# Patient Record
Sex: Female | Born: 1940 | Race: White | Hispanic: No | Smoking: Former smoker
Health system: Southern US, Community
[De-identification: ages and names within clinical notes are randomized; demographics above are authoritative.]

## PROBLEM LIST (undated history)

## (undated) DIAGNOSIS — A419 Sepsis, unspecified organism: Secondary | ICD-10-CM

## (undated) DIAGNOSIS — J449 Chronic obstructive pulmonary disease, unspecified: Secondary | ICD-10-CM

## (undated) DIAGNOSIS — J439 Emphysema, unspecified: Secondary | ICD-10-CM

## (undated) DIAGNOSIS — R0600 Dyspnea, unspecified: Secondary | ICD-10-CM

## (undated) DIAGNOSIS — K635 Polyp of colon: Secondary | ICD-10-CM

## (undated) DIAGNOSIS — F419 Anxiety disorder, unspecified: Secondary | ICD-10-CM

## (undated) DIAGNOSIS — N6459 Other signs and symptoms in breast: Secondary | ICD-10-CM

## (undated) DIAGNOSIS — I252 Old myocardial infarction: Secondary | ICD-10-CM

## (undated) DIAGNOSIS — J189 Pneumonia, unspecified organism: Secondary | ICD-10-CM

## (undated) DIAGNOSIS — I10 Essential (primary) hypertension: Secondary | ICD-10-CM

## (undated) DIAGNOSIS — K219 Gastro-esophageal reflux disease without esophagitis: Secondary | ICD-10-CM

## (undated) DIAGNOSIS — M810 Age-related osteoporosis without current pathological fracture: Secondary | ICD-10-CM

## (undated) DIAGNOSIS — Z923 Personal history of irradiation: Secondary | ICD-10-CM

## (undated) DIAGNOSIS — T7840XA Allergy, unspecified, initial encounter: Secondary | ICD-10-CM

## (undated) DIAGNOSIS — E785 Hyperlipidemia, unspecified: Secondary | ICD-10-CM

## (undated) DIAGNOSIS — M1990 Unspecified osteoarthritis, unspecified site: Secondary | ICD-10-CM

## (undated) DIAGNOSIS — I2510 Atherosclerotic heart disease of native coronary artery without angina pectoris: Secondary | ICD-10-CM

## (undated) DIAGNOSIS — R609 Edema, unspecified: Secondary | ICD-10-CM

## (undated) DIAGNOSIS — I639 Cerebral infarction, unspecified: Secondary | ICD-10-CM

## (undated) DIAGNOSIS — C801 Malignant (primary) neoplasm, unspecified: Secondary | ICD-10-CM

## (undated) DIAGNOSIS — Z8719 Personal history of other diseases of the digestive system: Secondary | ICD-10-CM

## (undated) DIAGNOSIS — D649 Anemia, unspecified: Secondary | ICD-10-CM

## (undated) HISTORY — DX: Essential (primary) hypertension: I10

## (undated) HISTORY — PX: HERNIA REPAIR: SHX51

## (undated) HISTORY — DX: Emphysema, unspecified: J43.9

## (undated) HISTORY — DX: Polyp of colon: K63.5

## (undated) HISTORY — DX: Cerebral infarction, unspecified: I63.9

## (undated) HISTORY — DX: Age-related osteoporosis without current pathological fracture: M81.0

## (undated) HISTORY — DX: Personal history of other diseases of the digestive system: Z87.19

## (undated) HISTORY — DX: Old myocardial infarction: I25.2

## (undated) HISTORY — DX: Chronic obstructive pulmonary disease, unspecified: J44.9

## (undated) HISTORY — DX: Allergy, unspecified, initial encounter: T78.40XA

## (undated) HISTORY — DX: Hyperlipidemia, unspecified: E78.5

## (undated) HISTORY — DX: Anxiety disorder, unspecified: F41.9

## (undated) HISTORY — DX: Gastro-esophageal reflux disease without esophagitis: K21.9

---

## 1981-07-28 DIAGNOSIS — C439 Malignant melanoma of skin, unspecified: Secondary | ICD-10-CM

## 1981-07-28 HISTORY — DX: Malignant melanoma of skin, unspecified: C43.9

## 1996-07-28 HISTORY — PX: ABDOMINAL HYSTERECTOMY: SHX81

## 2006-07-28 DIAGNOSIS — I252 Old myocardial infarction: Secondary | ICD-10-CM

## 2006-07-28 HISTORY — DX: Old myocardial infarction: I25.2

## 2006-07-28 HISTORY — PX: INSERTION OF ILIAC STENT: SHX6256

## 2013-07-28 HISTORY — PX: CHOLECYSTECTOMY: SHX55

## 2016-07-29 ENCOUNTER — Encounter: Payer: Self-pay | Admitting: Nurse Practitioner

## 2016-07-29 ENCOUNTER — Other Ambulatory Visit (INDEPENDENT_AMBULATORY_CARE_PROVIDER_SITE_OTHER): Payer: Medicare Other

## 2016-07-29 ENCOUNTER — Ambulatory Visit (INDEPENDENT_AMBULATORY_CARE_PROVIDER_SITE_OTHER)
Admission: RE | Admit: 2016-07-29 | Discharge: 2016-07-29 | Disposition: A | Discharge status: Home / Self Care | Payer: Medicare Other | Source: Ambulatory Visit | Attending: Nurse Practitioner | Admitting: Nurse Practitioner

## 2016-07-29 ENCOUNTER — Ambulatory Visit (INDEPENDENT_AMBULATORY_CARE_PROVIDER_SITE_OTHER): Payer: Medicare Other | Admitting: Nurse Practitioner

## 2016-07-29 VITALS — BP 170/84 | HR 66 | Temp 97.7°F | Ht 61.0 in | Wt 174.0 lb

## 2016-07-29 DIAGNOSIS — K529 Noninfective gastroenteritis and colitis, unspecified: Secondary | ICD-10-CM | POA: Insufficient documentation

## 2016-07-29 DIAGNOSIS — J439 Emphysema, unspecified: Secondary | ICD-10-CM

## 2016-07-29 DIAGNOSIS — F4323 Adjustment disorder with mixed anxiety and depressed mood: Secondary | ICD-10-CM | POA: Insufficient documentation

## 2016-07-29 DIAGNOSIS — E782 Mixed hyperlipidemia: Secondary | ICD-10-CM | POA: Diagnosis not present

## 2016-07-29 DIAGNOSIS — J441 Chronic obstructive pulmonary disease with (acute) exacerbation: Secondary | ICD-10-CM

## 2016-07-29 DIAGNOSIS — I10 Essential (primary) hypertension: Secondary | ICD-10-CM | POA: Diagnosis not present

## 2016-07-29 DIAGNOSIS — E785 Hyperlipidemia, unspecified: Secondary | ICD-10-CM | POA: Insufficient documentation

## 2016-07-29 DIAGNOSIS — E876 Hypokalemia: Secondary | ICD-10-CM

## 2016-07-29 DIAGNOSIS — I2510 Atherosclerotic heart disease of native coronary artery without angina pectoris: Secondary | ICD-10-CM | POA: Insufficient documentation

## 2016-07-29 DIAGNOSIS — H9190 Unspecified hearing loss, unspecified ear: Secondary | ICD-10-CM | POA: Insufficient documentation

## 2016-07-29 DIAGNOSIS — F4320 Adjustment disorder, unspecified: Secondary | ICD-10-CM

## 2016-07-29 DIAGNOSIS — M810 Age-related osteoporosis without current pathological fracture: Secondary | ICD-10-CM | POA: Insufficient documentation

## 2016-07-29 DIAGNOSIS — B0229 Other postherpetic nervous system involvement: Secondary | ICD-10-CM | POA: Insufficient documentation

## 2016-07-29 DIAGNOSIS — K219 Gastro-esophageal reflux disease without esophagitis: Secondary | ICD-10-CM | POA: Insufficient documentation

## 2016-07-29 DIAGNOSIS — Z780 Asymptomatic menopausal state: Secondary | ICD-10-CM

## 2016-07-29 DIAGNOSIS — J449 Chronic obstructive pulmonary disease, unspecified: Secondary | ICD-10-CM | POA: Diagnosis not present

## 2016-07-29 LAB — CBC WITH DIFFERENTIAL/PLATELET
Basophils Absolute: 0 10*3/uL (ref 0.0–0.1)
Basophils Relative: 0.3 % (ref 0.0–3.0)
EOS ABS: 0.4 10*3/uL (ref 0.0–0.7)
Eosinophils Relative: 4.4 % (ref 0.0–5.0)
HCT: 49 % — ABNORMAL HIGH (ref 36.0–46.0)
Hemoglobin: 16.5 g/dL — ABNORMAL HIGH (ref 12.0–15.0)
LYMPHS ABS: 2 10*3/uL (ref 0.7–4.0)
Lymphocytes Relative: 21 % (ref 12.0–46.0)
MCHC: 33.6 g/dL (ref 30.0–36.0)
MCV: 88.7 fl (ref 78.0–100.0)
Monocytes Absolute: 1.1 10*3/uL — ABNORMAL HIGH (ref 0.1–1.0)
Monocytes Relative: 11.4 % (ref 3.0–12.0)
NEUTROS ABS: 5.9 10*3/uL (ref 1.4–7.7)
NEUTROS PCT: 62.9 % (ref 43.0–77.0)
PLATELETS: 321 10*3/uL (ref 150.0–400.0)
RBC: 5.53 Mil/uL — AB (ref 3.87–5.11)
RDW: 14.2 % (ref 11.5–15.5)
WBC: 9.4 10*3/uL (ref 4.0–10.5)

## 2016-07-29 LAB — COMPREHENSIVE METABOLIC PANEL
ALT: 39 U/L — AB (ref 0–35)
AST: 36 U/L (ref 0–37)
Albumin: 4.3 g/dL (ref 3.5–5.2)
Alkaline Phosphatase: 48 U/L (ref 39–117)
BILIRUBIN TOTAL: 0.4 mg/dL (ref 0.2–1.2)
BUN: 16 mg/dL (ref 6–23)
CO2: 34 meq/L — AB (ref 19–32)
CREATININE: 0.75 mg/dL (ref 0.40–1.20)
Calcium: 9.5 mg/dL (ref 8.4–10.5)
Chloride: 98 mEq/L (ref 96–112)
GFR: 80.08 mL/min (ref >60.00)
GLUCOSE: 103 mg/dL — AB (ref 70–99)
Potassium: 4.2 mEq/L (ref 3.5–5.1)
SODIUM: 138 meq/L (ref 135–145)
Total Protein: 7.4 g/dL (ref 6.0–8.3)

## 2016-07-29 LAB — LIPID PANEL
CHOL/HDL RATIO: 4
Cholesterol: 198 mg/dL (ref 0–200)
HDL: 56 mg/dL (ref >39.00)
LDL CALC: 104 mg/dL — AB (ref 0–99)
NONHDL: 141.57
Triglycerides: 186 mg/dL — ABNORMAL HIGH (ref 0.0–149.0)
VLDL: 37.2 mg/dL (ref 0.0–40.0)

## 2016-07-29 LAB — MAGNESIUM: MAGNESIUM: 1.8 mg/dL (ref 1.5–2.5)

## 2016-07-29 MED ORDER — METOPROLOL SUCCINATE ER 50 MG PO TB24: 50.0000 mg | ORAL_TABLET | Freq: Two times a day (BID) | ORAL | 2 refills | Status: DC

## 2016-07-29 MED ORDER — WELCHOL 625 MG PO TABS: 625.0000 mg | ORAL_TABLET | Freq: Three times a day (TID) | ORAL | 2 refills | Status: DC

## 2016-07-29 MED ORDER — RALOXIFENE HCL 60 MG PO TABS: 60.0000 mg | ORAL_TABLET | Freq: Every day | ORAL | 2 refills | Status: DC

## 2016-07-29 MED ORDER — SYMBICORT 160-4.5 MCG/ACT IN AERO: 2.0000 | INHALATION_SPRAY | Freq: Two times a day (BID) | RESPIRATORY_TRACT | 5 refills | Status: DC

## 2016-07-29 MED ORDER — GABAPENTIN 100 MG PO CAPS: 100.0000 mg | ORAL_CAPSULE | Freq: Three times a day (TID) | ORAL | 2 refills | Status: DC

## 2016-07-29 MED ORDER — LOVASTATIN 40 MG PO TABS
40.0000 mg | ORAL_TABLET | Freq: Every day | ORAL | 2 refills | Status: DC
Start: 2016-07-29 — End: 2016-10-28

## 2016-07-29 MED ORDER — DM-GUAIFENESIN ER 30-600 MG PO TB12: 1.0000 | ORAL_TABLET | Freq: Two times a day (BID) | ORAL | 0 refills | Status: DC | PRN

## 2016-07-29 MED ORDER — AMLODIPINE BESYLATE 5 MG PO TABS: 5.0000 mg | ORAL_TABLET | Freq: Every day | ORAL | 2 refills | Status: DC

## 2016-07-29 MED ORDER — OMEPRAZOLE 20 MG PO CPDR: 20.0000 mg | DELAYED_RELEASE_CAPSULE | Freq: Every day | ORAL | 2 refills | Status: DC

## 2016-07-29 MED ORDER — ALPRAZOLAM 0.25 MG PO TABS: 0.2500 mg | ORAL_TABLET | Freq: Every evening | ORAL | 0 refills | Status: DC | PRN

## 2016-07-29 NOTE — Progress Notes (Signed)
Subjective:    Patient ID: Courtney Brown, female    DOB: [birth year], 76 y.o.   MRN: [id number]  Patient presents today for establish care (new patient) and medication refill.  Moved to [place] from [place] 51month ago.  Cough  This is a recurrent problem. The current episode started 1 to 4 weeks ago. The problem has been waxing and waning. The cough is productive of sputum. Associated symptoms include nasal congestion, postnasal drip, rhinorrhea, shortness of breath and wheezing. Pertinent negatives include no chest pain, chills, ear congestion, ear pain, fever, headaches, heartburn, hemoptysis, myalgias, rash, sore throat, sweats or weight loss. The symptoms are aggravated by lying down and cold air. She has tried a beta-agonist inhaler, steroid inhaler and OTC cough suppressant for the symptoms. The treatment provided mild relief. Her past medical history is significant for COPD.   Anxiety:  Related to move from and [place] and husband's death. xanax used prn x 4years. No SI or Hi. Use of ETOH once a week or every other week.  HTN: Elevated BP today. She thinks it might be due to prolonged use of mucinex D over the last 1week. only use of metoprolol. Used lisnopril in past but not sure why medication was discontinued.  COPD:  uses albuterol daily Unsure of last PFT. Uses symbicort BID as prescribed  Chronic Diarrhea: Stable with use of welchol daily and lomotil prn. Onset after cholescystectomy. Evaluated by Gi, no infectious cause found.  Vascular disease: occassional edema with prolonged standing. No tingling or numbness. No pain with ambulation or with elevation of LE.  Immunizations: (TDAP, Hep C screen, Pneumovax, Influenza, zoster)  Health Maintenance  Topic Date Due  . Tetanus Vaccine  08/10/1960  . Mammogram  08/11/1991  . Colon Cancer Screening  08/11/1991  . Shingles Vaccine  08/10/2001  . DEXA scan (bone density measurement)  08/10/2006  . Pneumonia vaccines (1 of 2  - PCV13) 08/10/2006  . Flu Shot  02/26/2016   Weight:  Wt Readings from Last 3 Encounters:  07/29/16 174 lb (78.9 kg)   Exercise:none Fall Risk: Fall Risk  07/29/2016  Falls in the past year? No   Home Safety: home with dauther Depression/Suicide: [facility] 07/29/2016  Decreased Interest 0  Down, Depressed, Hopeless 0  PHQ - 2 Score 0   No flowsheet data found. Colonoscopy (every 5-17yr >50-7567yr last done 19yr28yro, polyps removed (beign per patient) Dexa (every 2-39yrs69yr39yrs73yrded Pap Smear (every 67yrs f48yr21-29 without HPV, every 39yrs fo13yr0-639yrs wi10yrV): not needed Mammogram (yearly, >439yrs): l55yrone 39yrs ago V41yrn:last done 12/2015,no glaucoma, no catarracts Advanced Directive: does not have one, declined information No flowsheet data found. Sexual History (birth control, marital status, STD): widowed, s/p total hysterectomy.  Medications and allergies reviewed with patient and updated if appropriate.  Patient Active Problem List   Diagnosis Date Noted  . COPD exacerbation (HCC) 01/02/[place]8  . HTN (hypertension), benign 07/29/2016  . Hyperlipidemia 07/29/2016  . CAD (coronary artery disease) 07/29/2016  . Chronic diarrhea 07/29/2016  . Post herpetic neuralgia 07/29/2016  . Adjustment disorder 07/29/2016  . Osteoporosis 07/29/2016  . Post-menopausal 07/29/2016  . Gastroesophageal reflux disease without esophagitis 07/29/2016  . Hearing loss 07/29/2016    No current outpatient prescriptions on file prior to visit.   No current facility-administered medications on file prior to visit.     Past Medical History:  Diagnosis Date  . Allergy   . Anxiety   . Colon polyps   .  COPD (chronic obstructive pulmonary disease) ([place])   . Emphysema of lung ([place])   . GERD (gastroesophageal reflux disease)   . H/O hiatal hernia   . Hyperlipidemia   . Hypertension   . Myocardial infarct, old 2008   no stent placed  . Osteoporosis   . Stroke  [facility])    TIA    Past Surgical History:  Procedure Laterality Date  . ABDOMINAL HYSTERECTOMY  1998  . 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 HERNIA REPAIR     umbilical  . INSERTION OF ILIAC STENT  2008    Social History   Social History  . Marital status: Widowed    Spouse name: N/A  . Number of children: N/A  . Years of education: N/A   Social History Main Topics  . Smoking status: Former Smoker    Packs/day: 1.50    Years: 15.00  . Smokeless tobacco: Never Used     Comment: stop 04/05/2006  . Alcohol use 1.2 oz/week    2 Shots of liquor per week     Comment: social, weekly  . Drug use: No  . Sexual activity: Not Currently   Other Topics Concern  . None   Social History Narrative  . None    Family History  Problem Relation Age of Onset  . Hearing loss Father   . Hearing loss Maternal Aunt   . Hearing loss Maternal Grandmother   . Cancer Maternal Grandmother     colon  . Arthritis Mother   . Cancer Paternal Grandmother     colon        Review of Systems  Constitutional: Negative for chills, fever, malaise/fatigue and weight loss.  HENT: Positive for hearing loss, postnasal drip and rhinorrhea. Negative for congestion, ear pain, sinus pain and sore throat.   Eyes:       Negative for visual changes  Respiratory: Positive for cough, shortness of breath and wheezing. Negative for hemoptysis.   Cardiovascular: Negative for chest pain, palpitations and leg swelling.  Gastrointestinal: Negative for blood in stool, constipation, diarrhea and heartburn.  Genitourinary: Negative for dysuria, frequency and urgency.  Musculoskeletal: Negative for falls, joint pain and myalgias.  Skin: Negative for rash.  Neurological: Negative for dizziness, sensory change and headaches.  Endo/Heme/Allergies: Does not bruise/bleed easily.  Psychiatric/Behavioral: Negative for depression, memory loss, substance abuse and suicidal ideas. The patient is nervous/anxious.  The patient does not have insomnia.     Objective:   Vitals:   07/29/16 1439  BP: (!) 170/84  Pulse: 66  Temp: 97.7 F (36.5 C)    Body mass index is 32.88 kg/m.   Physical Examination:  Physical Exam  Constitutional: She is oriented to person, place, and time. No distress.  HENT:  Nose: Mucosal edema and rhinorrhea present. Right sinus exhibits no maxillary sinus tenderness and no frontal sinus tenderness. Left sinus exhibits no maxillary sinus tenderness and no frontal sinus tenderness.  Mouth/Throat: Uvula is midline. Posterior oropharyngeal erythema present. No oropharyngeal exudate.  Eyes: Conjunctivae and EOM are normal. Pupils are equal, round, and reactive to light.  Neck: Normal range of motion. Neck supple.  Cardiovascular: Normal rate, regular rhythm and normal heart sounds.   Pulmonary/Chest: Effort normal. She has wheezes. She has rales.  Musculoskeletal: She exhibits no edema.  Neurological: She is alert and oriented to person, place, and time.  Skin: Skin is warm and dry.  Vitals reviewed.   ASSESSMENT and PLAN:  Tais  was seen today for establish care.  Diagnoses and all orders for this visit:  HTN (hypertension), benign -     metoprolol succinate (TOPROL-XL) 50 MG 24 hr tablet; Take 1 tablet (50 mg total) by mouth 2 (two) times daily. -     Comp Met (CMET); Future -     amLODipine (NORVASC) 5 MG tablet; Take 1 tablet (5 mg total) by mouth daily.  Pulmonary emphysema, unspecified emphysema type ([place]) -     SYMBICORT 160-4.5 MCG/ACT inhaler; Inhale 2 puffs into the lungs 2 (two) times daily. Inhale 1-2 puffs twice a day -     DG Chest 2 View; Future -     dextromethorphan-guaiFENesin (MUCINEX DM) 30-600 MG 12hr tablet; Take 1 tablet by mouth 2 (two) times daily as needed for cough. -     CBC w/Diff; Future -     Pulmonary function test; Future  Mixed hyperlipidemia -     lovastatin (MEVACOR) 40 MG tablet; Take 1 tablet (40 mg total) by mouth at  bedtime. -     WELCHOL 625 MG tablet; Take 1 tablet (625 mg total) by mouth 3 (three) times daily. -     Comp Met (CMET); Future -     Lipid panel; Future -     CK total and CKMB (cardiac)not at [facility]; Future  Coronary artery disease involving native heart without angina pectoris, unspecified vessel or lesion type  Chronic diarrhea -     WELCHOL 625 MG tablet; Take 1 tablet (625 mg total) by mouth 3 (three) times daily. -     Comp Met (CMET); Future -     Magnesium; Future  Post herpetic neuralgia -     gabapentin (NEURONTIN) 100 MG capsule; Take 1 capsule (100 mg total) by mouth 3 (three) times daily. For shingle  Adjustment disorder, unspecified type -     ALPRAZolam (XANAX) 0.25 MG tablet; Take 1 tablet (0.25 mg total) by mouth at bedtime as needed for anxiety.  Hypokalemia -     Comp Met (CMET); Future  Hypomagnesemia -     Comp Met (CMET); Future -     Magnesium; Future  Age related osteoporosis, unspecified pathological fracture presence -     raloxifene (EVISTA) 60 MG tablet; Take 1 tablet (60 mg total) by mouth daily. -     DG Bone Density; Future  Post-menopausal -     raloxifene (EVISTA) 60 MG tablet; Take 1 tablet (60 mg total) by mouth daily. -     DG Bone Density; Future -     MM Digital Screening; Future  Gastroesophageal reflux disease without esophagitis -     omeprazole (PRILOSEC) 20 MG capsule; Take 1 capsule (20 mg total) by mouth daily.    No problem-specific Assessment & Plan notes found for this encounter.   Recent Results (from the past 2160 hour(s))  CBC w/Diff     Status: Abnormal   Collection Time: 07/29/16  4:14 PM  Result Value Ref Range   WBC 9.4 4.0 - 10.5 K/uL   RBC 5.53 (H) 3.87 - 5.11 Mil/uL   Hemoglobin 16.5 (H) 12.0 - 15.0 g/dL   HCT 49.0 (H) 36.0 - 46.0 %   MCV 88.7 78.0 - 100.0 fl   MCHC 33.6 30.0 - 36.0 g/dL   RDW 14.2 11.5 - 15.5 %   Platelets 321.0 150.0 - 400.0 K/uL   Neutrophils Relative % 62.9 43.0 - 77.0 %   Lymphocytes  Relative 21.0 12.0 -  46.0 %   Monocytes Relative 11.4 3.0 - 12.0 %   Eosinophils Relative 4.4 0.0 - 5.0 %   Basophils Relative 0.3 0.0 - 3.0 %   Neutro Abs 5.9 1.4 - 7.7 K/uL   Lymphs Abs 2.0 0.7 - 4.0 K/uL   Monocytes Absolute 1.1 (H) 0.1 - 1.0 K/uL   Eosinophils Absolute 0.4 0.0 - 0.7 K/uL   Basophils Absolute 0.0 0.0 - 0.1 K/uL  Comp Met (CMET)     Status: Abnormal   Collection Time: 07/29/16  4:14 PM  Result Value Ref Range   Sodium 138 135 - 145 mEq/L   Potassium 4.2 3.5 - 5.1 mEq/L   Chloride 98 96 - 112 mEq/L   CO2 34 (H) 19 - 32 mEq/L   Glucose, Bld 103 (H) 70 - 99 mg/dL   BUN 16 6 - 23 mg/dL   Creatinine, Ser 0.75 0.40 - 1.20 mg/dL   Total Bilirubin 0.4 0.2 - 1.2 mg/dL   Alkaline Phosphatase 48 39 - 117 U/L   AST 36 0 - 37 U/L   ALT 39 (H) 0 - 35 U/L   Total Protein 7.4 6.0 - 8.3 g/dL   Albumin 4.3 3.5 - 5.2 g/dL   Calcium 9.5 8.4 - 10.5 mg/dL   GFR 80.08 >60.00 mL/min  Lipid panel     Status: Abnormal   Collection Time: 07/29/16  4:14 PM  Result Value Ref Range   Cholesterol 198 0 - 200 mg/dL    Comment: ATP III Classification       Desirable:  < 200 mg/dL               Borderline High:  200 - 239 mg/dL          High:  > = 240 mg/dL   Triglycerides 186.0 (H) 0.0 - 149.0 mg/dL    Comment: Normal:  <150 mg/dLBorderline High:  150 - 199 mg/dL   HDL 56.00 >39.00 mg/dL   VLDL 37.2 0.0 - 40.0 mg/dL   LDL Cholesterol 104 (H) 0 - 99 mg/dL   Total CHOL/HDL Ratio 4     Comment:                Men          Women1/2 Average Risk     3.4          3.3Average Risk          5.0          4.42X Average Risk          9.6          7.13X Average Risk          15.0          11.0                       NonHDL 141.57     Comment: NOTE:  Non-HDL goal should be 30 mg/dL higher than patient's LDL goal (i.e. LDL goal of < 70 mg/dL, would have non-HDL goal of < 100 mg/dL)  Magnesium     Status: None   Collection Time: 07/29/16  4:14 PM  Result Value Ref Range   Magnesium 1.8 1.5 - 2.5 mg/dL         Follow up: Return in about 3 months (around 10/27/2016) for HTN and, hyperlipidemia.  Wilfred Lacy, NP

## 2016-07-29 NOTE — Progress Notes (Signed)
Pre visit review using our clinic review tool, if applicable. No additional management support is needed unless otherwise documented below in the visit note. 

## 2016-07-29 NOTE — Patient Instructions (Addendum)
Normal CXR. Treat nasal and chest congestion with flonase, benzonatate and mucinex DM.  Sign medical release for records from previous providers.  Start new BP medication today  URI Instructions: Flonase and Afrin use: apply 1spray of afrin in each nare, wait 35mins, then apply 2sprays of flonase in each nare. Use both nasal spray consecutively x 3days, then flonase only for at least 14days.  Encourage adequate oral hydration.  Use over-the-counter  "cold" medicines  such as "Tylenol cold" , "Advil cold",  "Mucinex" or" Mucinex D"  for cough and congestion.  Avoid decongestants if you have high blood pressure. Use" Delsym" or" Robitussin" cough syrup varietis for cough.  You can use plain "Tylenol" or "Advi"l for fever, chills and achyness.

## 2016-07-30 LAB — CK TOTAL AND CKMB (NOT AT ARMC)
CK TOTAL: 44 U/L (ref 7–177)
CK, MB: 1.9 ng/mL (ref 0.0–5.0)
Relative Index: 4.3 — ABNORMAL HIGH (ref 0.0–4.0)

## 2016-07-31 NOTE — Progress Notes (Signed)
Normal results

## 2016-08-08 ENCOUNTER — Encounter: Payer: Self-pay | Admitting: Nurse Practitioner

## 2016-08-08 DIAGNOSIS — I739 Peripheral vascular disease, unspecified: Secondary | ICD-10-CM | POA: Insufficient documentation

## 2016-08-08 DIAGNOSIS — K635 Polyp of colon: Secondary | ICD-10-CM | POA: Insufficient documentation

## 2016-09-24 ENCOUNTER — Other Ambulatory Visit: Payer: Self-pay | Admitting: Internal Medicine

## 2016-09-24 DIAGNOSIS — F4320 Adjustment disorder, unspecified: Secondary | ICD-10-CM

## 2016-09-25 NOTE — Telephone Encounter (Signed)
Called pt to inform her what charolotte states concerning refill on her alprazolam. No answer LMOM w/charlotte response. Faxed [facility] to [facility].../lmb

## 2016-09-26 ENCOUNTER — Other Ambulatory Visit: Payer: Self-pay | Admitting: Internal Medicine

## 2016-09-26 DIAGNOSIS — F4320 Adjustment disorder, unspecified: Secondary | ICD-10-CM

## 2016-10-27 ENCOUNTER — Ambulatory Visit: Payer: Medicare Other | Admitting: Nurse Practitioner

## 2016-10-28 ENCOUNTER — Telehealth: Payer: Self-pay | Admitting: *Deleted

## 2016-10-28 DIAGNOSIS — I10 Essential (primary) hypertension: Secondary | ICD-10-CM

## 2016-10-28 DIAGNOSIS — E782 Mixed hyperlipidemia: Secondary | ICD-10-CM

## 2016-10-28 MED ORDER — LOVASTATIN 40 MG PO TABS: 40.0000 mg | ORAL_TABLET | Freq: Every day | ORAL | 0 refills | Status: DC

## 2016-10-28 MED ORDER — AMLODIPINE BESYLATE 5 MG PO TABS: 5.0000 mg | ORAL_TABLET | Freq: Every day | ORAL | 0 refills | Status: DC

## 2016-10-28 NOTE — Telephone Encounter (Signed)
Rec'd call pt states she have appt set for 4/9, but she is completely out of her amlodipine & lovastatin. Wanting to get a refill on med until appt. Inform pt will send a week supply to [facility] until appt...Johny Chess

## 2016-10-29 ENCOUNTER — Ambulatory Visit (INDEPENDENT_AMBULATORY_CARE_PROVIDER_SITE_OTHER): Payer: Medicare Other | Admitting: Internal Medicine

## 2016-10-29 DIAGNOSIS — J439 Emphysema, unspecified: Secondary | ICD-10-CM

## 2016-10-29 DIAGNOSIS — J441 Chronic obstructive pulmonary disease with (acute) exacerbation: Secondary | ICD-10-CM

## 2016-10-29 LAB — PULMONARY FUNCTION TEST
DL/VA % PRED: 90 %
DL/VA: 4 ml/min/mmHg/L
DLCO COR: 12.31 ml/min/mmHg
DLCO UNC % PRED: 63 %
DLCO UNC: 12.74 ml/min/mmHg
DLCO cor % pred: 60 %
FEF 25-75 PRE: 0.34 L/s
FEF 25-75 Post: 0.83 L/sec
FEF2575-%CHANGE-POST: 144 %
FEF2575-%PRED-POST: 56 %
FEF2575-%PRED-PRE: 22 %
FEV1-%Change-Post: 33 %
FEV1-%PRED-POST: 54 %
FEV1-%PRED-PRE: 40 %
FEV1-POST: 0.99 L
FEV1-Pre: 0.74 L
FEV1FVC-%Change-Post: 6 %
FEV1FVC-%Pred-Pre: 78 %
FEV6-%CHANGE-POST: 26 %
FEV6-%Pred-Post: 67 %
FEV6-%Pred-Pre: 53 %
FEV6-Post: 1.57 L
FEV6-Pre: 1.24 L
FEV6FVC-%Change-Post: 0 %
FEV6FVC-%Pred-Post: 104 %
FEV6FVC-%Pred-Pre: 104 %
FVC-%Change-Post: 25 %
FVC-%Pred-Post: 64 %
FVC-%Pred-Pre: 51 %
FVC-Post: 1.58 L
FVC-Pre: 1.26 L
POST FEV1/FVC RATIO: 63 %
POST FEV6/FVC RATIO: 99 %
PRE FEV1/FVC RATIO: 59 %
Pre FEV6/FVC Ratio: 99 %
RV % pred: 124 %
RV: 2.65 L
TLC % pred: 91 %
TLC: 4.2 L

## 2016-10-29 NOTE — Progress Notes (Signed)
PFT done today. 

## 2016-10-31 MED ORDER — TIOTROPIUM BROMIDE MONOHYDRATE 18 MCG IN CAPS: 18.0000 ug | ORAL_CAPSULE | Freq: Every day | RESPIRATORY_TRACT | 12 refills | Status: DC

## 2016-10-31 NOTE — Addendum Note (Signed)
Addended by: Wilfred Lacy L on: 10/31/2016 01:35 PM   Modules accepted: Orders

## 2016-11-03 ENCOUNTER — Ambulatory Visit (INDEPENDENT_AMBULATORY_CARE_PROVIDER_SITE_OTHER): Payer: Medicare Other | Admitting: Nurse Practitioner

## 2016-11-03 ENCOUNTER — Encounter: Payer: Self-pay | Admitting: Nurse Practitioner

## 2016-11-03 ENCOUNTER — Telehealth: Payer: Self-pay | Admitting: Nurse Practitioner

## 2016-11-03 ENCOUNTER — Other Ambulatory Visit: Payer: Self-pay | Admitting: Nurse Practitioner

## 2016-11-03 VITALS — BP 146/70 | HR 64 | Temp 97.5°F | Ht 61.0 in | Wt 174.0 lb

## 2016-11-03 DIAGNOSIS — M810 Age-related osteoporosis without current pathological fracture: Secondary | ICD-10-CM | POA: Diagnosis not present

## 2016-11-03 DIAGNOSIS — Z1231 Encounter for screening mammogram for malignant neoplasm of breast: Secondary | ICD-10-CM

## 2016-11-03 DIAGNOSIS — Z23 Encounter for immunization: Secondary | ICD-10-CM | POA: Diagnosis not present

## 2016-11-03 DIAGNOSIS — F4320 Adjustment disorder, unspecified: Secondary | ICD-10-CM

## 2016-11-03 DIAGNOSIS — E782 Mixed hyperlipidemia: Secondary | ICD-10-CM

## 2016-11-03 DIAGNOSIS — R600 Localized edema: Secondary | ICD-10-CM

## 2016-11-03 DIAGNOSIS — B0229 Other postherpetic nervous system involvement: Secondary | ICD-10-CM | POA: Diagnosis not present

## 2016-11-03 DIAGNOSIS — K529 Noninfective gastroenteritis and colitis, unspecified: Secondary | ICD-10-CM | POA: Diagnosis not present

## 2016-11-03 DIAGNOSIS — I10 Essential (primary) hypertension: Secondary | ICD-10-CM | POA: Diagnosis not present

## 2016-11-03 DIAGNOSIS — Z780 Asymptomatic menopausal state: Secondary | ICD-10-CM

## 2016-11-03 DIAGNOSIS — K219 Gastro-esophageal reflux disease without esophagitis: Secondary | ICD-10-CM

## 2016-11-03 MED ORDER — METOPROLOL SUCCINATE ER 50 MG PO TB24: 50.0000 mg | ORAL_TABLET | Freq: Two times a day (BID) | ORAL | 1 refills | Status: DC

## 2016-11-03 MED ORDER — ALBUTEROL SULFATE HFA 108 (90 BASE) MCG/ACT IN AERS: 1.0000 | INHALATION_SPRAY | Freq: Four times a day (QID) | RESPIRATORY_TRACT | 3 refills | Status: DC | PRN

## 2016-11-03 MED ORDER — FENOFIBRATE 145 MG PO TABS: 145.0000 mg | ORAL_TABLET | Freq: Every day | ORAL | 1 refills | Status: DC

## 2016-11-03 MED ORDER — LOVASTATIN 40 MG PO TABS: 40.0000 mg | ORAL_TABLET | Freq: Every day | ORAL | 1 refills | Status: DC

## 2016-11-03 MED ORDER — OMEPRAZOLE 20 MG PO CPDR: 20.0000 mg | DELAYED_RELEASE_CAPSULE | Freq: Every day | ORAL | 1 refills | Status: DC

## 2016-11-03 MED ORDER — GABAPENTIN 100 MG PO CAPS: 100.0000 mg | ORAL_CAPSULE | Freq: Three times a day (TID) | ORAL | 1 refills | Status: DC

## 2016-11-03 MED ORDER — RALOXIFENE HCL 60 MG PO TABS: 60.0000 mg | ORAL_TABLET | Freq: Every day | ORAL | 1 refills | Status: DC

## 2016-11-03 MED ORDER — WELCHOL 625 MG PO TABS: 625.0000 mg | ORAL_TABLET | Freq: Two times a day (BID) | ORAL | 1 refills | Status: DC

## 2016-11-03 MED ORDER — AMLODIPINE BESYLATE 5 MG PO TABS: 5.0000 mg | ORAL_TABLET | Freq: Every day | ORAL | 1 refills | Status: DC

## 2016-11-03 MED ORDER — KNEE COMPRESSION SLEEVE/L/XL MISC: 1.0000 [IU] | Freq: Every day | 0 refills | Status: DC

## 2016-11-03 MED ORDER — ALPRAZOLAM 0.25 MG PO TABS: ORAL_TABLET | ORAL | 1 refills | Status: DC

## 2016-11-03 NOTE — Progress Notes (Signed)
Subjective:  Patient ID: Courtney Brown, female    DOB: 1940/08/26  Age: 76 y.o. MRN: [id number]  CC: Follow-up (3 mo fu BP/ got the list of BP/req meds refill/ tdap?)   HPI HTN follow up:  Home BP readings: 130s/63 to 140s/70s.  No palpitation, no HA, no dizziness, Edema worse during the day and improves with leg elevation. No pain with ambulation.  Also needs medications refilled.  Anxiety: Stable. Use of xanax once a day at bedtime.  Hyperlipidemia: No myalgia or arthralgia  Chronic diarrhea: stable with use of welchol.  COPD: Current tobacco use. No interested in quitting at this time. SOB stable with use of albuterol, spiriva and symbicort.  GERD: stable with omeprazole.  Outpatient Medications Prior to Visit  Medication Sig Dispense Refill  . acetaminophen (TYLENOL) 500 MG tablet Take 500 mg by mouth every 6 (six) hours as needed.    Marland Kitchen albuterol (PROVENTIL HFA;VENTOLIN HFA) 108 (90 Base) MCG/ACT inhaler Inhale 1-2 puffs into the lungs every 6 (six) hours as needed for wheezing or shortness of breath. 2 puff by mouth every 4 hours PRN 1 Inhaler 3  . aspirin 81 MG tablet Take 81 mg by mouth daily.    Marland Kitchen dextromethorphan-guaiFENesin (MUCINEX DM) 30-600 MG 12hr tablet Take 1 tablet by mouth 2 (two) times daily as needed for cough. 14 tablet 0  . diphenoxylate-atropine (LOMOTIL) 2.5-0.025 MG tablet Take by mouth 4 (four) times daily as needed for diarrhea or loose stools.    . Ferrous Gluconate-C-Folic Acid (IRON-C PO) Take 65 mg by mouth 1 day or 1 dose.    . Garlic Oil 8588 MG CAPS Take 1,000 mg by mouth 1 day or 1 dose.    . Lactobacillus (PROBIOTIC ACIDOPHILUS) CAPS Take by mouth once.    . loratadine (CLARITIN) 10 MG tablet Take 10 mg by mouth daily.  3  . ondansetron (ZOFRAN) 4 MG tablet Take 4 mg by mouth every 8 (eight) hours as needed for nausea or vomiting.    . SYMBICORT 160-4.5 MCG/ACT inhaler Inhale 2 puffs into the lungs 2 (two) times daily. Inhale 1-2 puffs  twice a day 1 Inhaler 5  . tiotropium (SPIRIVA) 18 MCG inhalation capsule Place 1 capsule (18 mcg total) into inhaler and inhale daily. 30 capsule 12  . ALPRAZolam (XANAX) 0.25 MG tablet TAKE 1 TABLET AT BEDTIME AS NEEDED FOR ANXIETY 30 tablet 0  . amLODipine (NORVASC) 5 MG tablet Take 1 tablet (5 mg total) by mouth daily. Must keep 11/03/16 appt for future refills 7 tablet 0  . fenofibrate (TRICOR) 145 MG tablet Take 145 mg by mouth daily.  3  . gabapentin (NEURONTIN) 100 MG capsule Take 1 capsule (100 mg total) by mouth 3 (three) times daily. For shingle 90 capsule 2  . lovastatin (MEVACOR) 40 MG tablet Take 1 tablet (40 mg total) by mouth at bedtime. Must keep 11/03/16 appt for future refills 7 tablet 0  . metoprolol succinate (TOPROL-XL) 50 MG 24 hr tablet Take 1 tablet (50 mg total) by mouth 2 (two) times daily. 60 tablet 2  . omeprazole (PRILOSEC) 20 MG capsule Take 1 capsule (20 mg total) by mouth daily. 30 capsule 2  . raloxifene (EVISTA) 60 MG tablet Take 1 tablet (60 mg total) by mouth daily. 30 tablet 2  . WELCHOL 625 MG tablet Take 1 tablet (625 mg total) by mouth 3 (three) times daily. 90 tablet 2   No facility-administered medications prior to visit.  ROS See HPI  Objective:  BP (!) 146/70   Pulse 64   Temp 97.5 F (36.4 C)   Ht 5\' 1"  (1.549 m)   Wt 174 lb (78.9 kg)   SpO2 91%   BMI 32.88 kg/m   BP Readings from Last 3 Encounters:  11/03/16 (!) 146/70  07/29/16 (!) 170/84    Wt Readings from Last 3 Encounters:  11/03/16 174 lb (78.9 kg)  07/29/16 174 lb (78.9 kg)    Physical Exam  Constitutional: She is oriented to person, place, and time. No distress.  HENT:  Right Ear: External ear normal.  Left Ear: External ear normal.  Nose: Nose normal.  Mouth/Throat: No oropharyngeal exudate.  Eyes: No scleral icterus.  Neck: Normal range of motion. Neck supple. No thyromegaly present.  Cardiovascular: Normal rate, regular rhythm and normal heart sounds.     Pulmonary/Chest: Effort normal and breath sounds normal. No respiratory distress.  Abdominal: Soft. She exhibits no distension.  Musculoskeletal: Normal range of motion. She exhibits edema. She exhibits no tenderness.  Lymphadenopathy:    She has no cervical adenopathy.  Neurological: She is alert and oriented to person, place, and time.  Skin: Skin is warm and dry.  Psychiatric: She has a normal mood and affect. Her behavior is normal.  Vitals reviewed.   Lab Results  Component Value Date   WBC 9.4 07/29/2016   HGB 16.5 (H) 07/29/2016   HCT 49.0 (H) 07/29/2016   PLT 321.0 07/29/2016   GLUCOSE 103 (H) 07/29/2016   CHOL 198 07/29/2016   TRIG 186.0 (H) 07/29/2016   HDL 56.00 07/29/2016   LDLCALC 104 (H) 07/29/2016   ALT 39 (H) 07/29/2016   AST 36 07/29/2016   NA 138 07/29/2016   K 4.2 07/29/2016   CL 98 07/29/2016   CREATININE 0.75 07/29/2016   BUN 16 07/29/2016   CO2 34 (H) 07/29/2016    Dg Chest 2 View  Result Date: 07/29/2016 CLINICAL DATA:  COPD exacerbation EXAM: CHEST  2 VIEW COMPARISON:  None. FINDINGS: Pulmonary hyperinflation consistent with COPD. Apical scarring on the right. Negative for pneumonia. Negative for mass or adenopathy. Cardiac enlargement without heart failure. Atherosclerotic calcification in the aorta. IMPRESSION: COPD without acute cardiopulmonary abnormality. Electronically Signed   By: Franchot Gallo M.D.   On: 07/29/2016 16:15    Assessment & Plan:   Lasean was seen today for follow-up.  Diagnoses and all orders for this visit:  HTN (hypertension), benign -     amLODipine (NORVASC) 5 MG tablet; Take 1 tablet (5 mg total) by mouth at bedtime. Must keep 11/03/16 appt for future refills -     metoprolol succinate (TOPROL-XL) 50 MG 24 hr tablet; Take 1 tablet (50 mg total) by mouth 2 (two) times daily.  Mixed hyperlipidemia -     WELCHOL 625 MG tablet; Take 1 tablet (625 mg total) by mouth 2 (two) times daily with a meal. -     lovastatin  (MEVACOR) 40 MG tablet; Take 1 tablet (40 mg total) by mouth at bedtime. Must keep 11/03/16 appt for future refills -     fenofibrate (TRICOR) 145 MG tablet; Take 1 tablet (145 mg total) by mouth daily.  Adjustment disorder, unspecified type -     ALPRAZolam (XANAX) 0.25 MG tablet; TAKE 1 TABLET AT BEDTIME AS NEEDED FOR ANXIETY  Post herpetic neuralgia -     gabapentin (NEURONTIN) 100 MG capsule; Take 1 capsule (100 mg total) by mouth 3 (three) times daily.  For shingle  Chronic diarrhea -     WELCHOL 625 MG tablet; Take 1 tablet (625 mg total) by mouth 2 (two) times daily with a meal.  Gastroesophageal reflux disease without esophagitis -     omeprazole (PRILOSEC) 20 MG capsule; Take 1 capsule (20 mg total) by mouth daily.  Age related osteoporosis, unspecified pathological fracture presence -     raloxifene (EVISTA) 60 MG tablet; Take 1 tablet (60 mg total) by mouth daily.  Post-menopausal -     raloxifene (EVISTA) 60 MG tablet; Take 1 tablet (60 mg total) by mouth daily.  Need for diphtheria-tetanus-pertussis (Tdap) vaccine, adult/adolescent -     Tdap vaccine greater than or equal to 7yo IM  Bilateral leg edema -     Elastic Bandages & Supports (KNEE COMPRESSION SLEEVE/L/XL) MISC; 1 Units by Does not apply route daily.   I have changed Ms. Rauber's amLODipine, WELCHOL, and fenofibrate. I am also having her start on Knee Compression Sleeve/L/XL. Additionally, I am having her maintain her loratadine, diphenoxylate-atropine, ondansetron, aspirin, PROBIOTIC ACIDOPHILUS, acetaminophen, Garlic Oil, Ferrous Gluconate-C-Folic Acid (IRON-C PO), SYMBICORT, dextromethorphan-guaiFENesin, tiotropium, albuterol, gabapentin, lovastatin, metoprolol succinate, omeprazole, raloxifene, and ALPRAZolam.  Meds ordered this encounter  Medications  . gabapentin (NEURONTIN) 100 MG capsule    Sig: Take 1 capsule (100 mg total) by mouth 3 (three) times daily. For shingle    Dispense:  270 capsule    Refill:   1    Order Specific Question:   Supervising Provider    Answer:   Cassandria Anger [[id number]]  . amLODipine (NORVASC) 5 MG tablet    Sig: Take 1 tablet (5 mg total) by mouth at bedtime. Must keep 11/03/16 appt for future refills    Dispense:  90 tablet    Refill:  1    Order Specific Question:   Supervising Provider    Answer:   Cassandria Anger [[id number]]  . WELCHOL 625 MG tablet    Sig: Take 1 tablet (625 mg total) by mouth 2 (two) times daily with a meal.    Dispense:  180 tablet    Refill:  1    Order Specific Question:   Supervising Provider    Answer:   Cassandria Anger [[id number]]  . lovastatin (MEVACOR) 40 MG tablet    Sig: Take 1 tablet (40 mg total) by mouth at bedtime. Must keep 11/03/16 appt for future refills    Dispense:  90 tablet    Refill:  1    Order Specific Question:   Supervising Provider    Answer:   Cassandria Anger [[id number]]  . metoprolol succinate (TOPROL-XL) 50 MG 24 hr tablet    Sig: Take 1 tablet (50 mg total) by mouth 2 (two) times daily.    Dispense:  180 tablet    Refill:  1    Order Specific Question:   Supervising Provider    Answer:   Cassandria Anger [[id number]]  . omeprazole (PRILOSEC) 20 MG capsule    Sig: Take 1 capsule (20 mg total) by mouth daily.    Dispense:  90 capsule    Refill:  1    Order Specific Question:   Supervising Provider    Answer:   Cassandria Anger [[id number]]  . raloxifene (EVISTA) 60 MG tablet    Sig: Take 1 tablet (60 mg total) by mouth daily.    Dispense:  90 tablet    Refill:  1    Order Specific Question:  Supervising Provider    Answer:   Cassandria Anger [[id number]]  . fenofibrate (TRICOR) 145 MG tablet    Sig: Take 1 tablet (145 mg total) by mouth daily.    Dispense:  90 tablet    Refill:  1    Order Specific Question:   Supervising Provider    Answer:   Cassandria Anger [[id number]]  . ALPRAZolam (XANAX) 0.25 MG tablet    Sig: TAKE 1 TABLET AT BEDTIME AS NEEDED FOR ANXIETY    Dispense:  30 tablet    Refill:  1     Not to exceed 5 additional fills before 07/29/2018CUSTOMER NEEDS REFILLS PLEASE. THANK YOU.    Order Specific Question:   Supervising Provider    Answer:   Cassandria Anger [[id number]]  . Elastic Bandages & Supports (KNEE COMPRESSION SLEEVE/L/XL) MISC    Sig: 1 Units by Does not apply route daily.    Dispense:  1 each    Refill:  0    Order Specific Question:   Supervising Provider    Answer:   Cassandria Anger [[id number]]    Follow-up: Return in about 6 months (around 05/05/2017) for hyperlipidemia and anxiety (fasting, need repeat labs.Wilfred Lacy, NP

## 2016-11-03 NOTE — Progress Notes (Signed)
Pre visit review using our clinic review tool, if applicable. No additional management support is needed unless otherwise documented below in the visit note. 

## 2016-11-03 NOTE — Telephone Encounter (Signed)
rx sent as request

## 2016-11-03 NOTE — Patient Instructions (Addendum)
Need to schedule mammogram and dexa scan.  Use compression stocking during the day and off at night.   DASH Eating Plan DASH stands for "Dietary Approaches to Stop Hypertension." The DASH eating plan is a healthy eating plan that has been shown to reduce high blood pressure (hypertension). It may also reduce your risk for type 2 diabetes, heart disease, and stroke. The DASH eating plan may also help with weight loss. What are tips for following this plan? General guidelines   Avoid eating more than 2,300 mg (milligrams) of salt (sodium) a day. If you have hypertension, you may need to reduce your sodium intake to 1,500 mg a day.  Limit alcohol intake to no more than 1 drink a day for nonpregnant women and 2 drinks a day for men. One drink equals 12 oz of beer, 5 oz of wine, or 1 oz of hard liquor.  Work with your health care provider to maintain a healthy body weight or to lose weight. Ask what an ideal weight is for you.  Get at least 30 minutes of exercise that causes your heart to beat faster (aerobic exercise) most days of the week. Activities may include walking, swimming, or biking.  Work with your health care provider or diet and nutrition specialist (dietitian) to adjust your eating plan to your individual calorie needs. Reading food labels   Check food labels for the amount of sodium per serving. Choose foods with less than 5 percent of the Daily Value of sodium. Generally, foods with less than 300 mg of sodium per serving fit into this eating plan.  To find whole grains, look for the word "whole" as the first word in the ingredient list. Shopping   Buy products labeled as "low-sodium" or "no salt added."  Buy fresh foods. Avoid canned foods and premade or frozen meals. Cooking   Avoid adding salt when cooking. Use salt-free seasonings or herbs instead of table salt or sea salt. Check with your health care provider or pharmacist before using salt substitutes.  Do not fry  foods. Cook foods using healthy methods such as baking, boiling, grilling, and broiling instead.  Cook with heart-healthy oils, such as olive, canola, soybean, or sunflower oil. Meal planning    Eat a balanced diet that includes:  5 or more servings of fruits and vegetables each day. At each meal, try to fill half of your plate with fruits and vegetables.  Up to 6-8 servings of whole grains each day.  Less than 6 oz of lean meat, poultry, or fish each day. A 3-oz serving of meat is about the same size as a deck of cards. One egg equals 1 oz.  2 servings of low-fat dairy each day.  A serving of nuts, seeds, or beans 5 times each week.  Heart-healthy fats. Healthy fats called Omega-3 fatty acids are found in foods such as flaxseeds and coldwater fish, like sardines, salmon, and mackerel.  Limit how much you eat of the following:  Canned or prepackaged foods.  Food that is high in trans fat, such as fried foods.  Food that is high in saturated fat, such as fatty meat.  Sweets, desserts, sugary drinks, and other foods with added sugar.  Full-fat dairy products.  Do not salt foods before eating.  Try to eat at least 2 vegetarian meals each week.  Eat more home-cooked food and less restaurant, buffet, and fast food.  When eating at a restaurant, ask that your food be prepared with less  salt or no salt, if possible. What foods are recommended? The items listed may not be a complete list. Talk with your dietitian about what dietary choices are best for you. Grains  Whole-grain or whole-wheat bread. Whole-grain or whole-wheat pasta. Brown rice. [place]. Bulgur. Whole-grain and low-sodium cereals. Pita bread. Low-fat, low-sodium crackers. Whole-wheat flour tortillas. Vegetables  Fresh or frozen vegetables (raw, steamed, roasted, or grilled). Low-sodium or reduced-sodium tomato and vegetable juice. Low-sodium or reduced-sodium tomato sauce and tomato paste. Low-sodium or  reduced-sodium canned vegetables. Fruits  All fresh, dried, or frozen fruit. Canned fruit in natural juice (without added sugar). Meat and other protein foods  Skinless chicken or [place]. Ground chicken or [place]. Pork with fat trimmed off. Fish and seafood. Egg whites. Dried beans, peas, or lentils. Unsalted nuts, nut butters, and seeds. Unsalted canned beans. Lean cuts of beef with fat trimmed off. Low-sodium, lean deli meat. Dairy  Low-fat (1%) or fat-free (skim) milk. Fat-free, low-fat, or reduced-fat cheeses. Nonfat, low-sodium ricotta or cottage cheese. Low-fat or nonfat yogurt. Low-fat, low-sodium cheese. Fats and oils  Soft margarine without trans fats. Vegetable oil. Low-fat, reduced-fat, or light mayonnaise and salad dressings (reduced-sodium). Canola, safflower, olive, soybean, and sunflower oils. Avocado. Seasoning and other foods  Herbs. Spices. Seasoning mixes without salt. Unsalted popcorn and pretzels. Fat-free sweets. What foods are not recommended? The items listed may not be a complete list. Talk with your dietitian about what dietary choices are best for you. Grains  Baked goods made with fat, such as croissants, muffins, or some breads. Dry pasta or rice meal packs. Vegetables  Creamed or fried vegetables. Vegetables in a cheese sauce. Regular canned vegetables (not low-sodium or reduced-sodium). Regular canned tomato sauce and paste (not low-sodium or reduced-sodium). Regular tomato and vegetable juice (not low-sodium or reduced-sodium). Angie Fava. Olives. Fruits  Canned fruit in a light or heavy syrup. Fried fruit. Fruit in cream or butter sauce. Meat and other protein foods  Fatty cuts of meat. Ribs. Fried meat. Berniece Salines. Sausage. Bologna and other processed lunch meats. Salami. Fatback. Hotdogs. Bratwurst. Salted nuts and seeds. Canned beans with added salt. Canned or smoked fish. Whole eggs or egg yolks. Chicken or [place] with skin. Dairy  Whole or 2% milk, cream, and  half-and-half. Whole or full-fat cream cheese. Whole-fat or sweetened yogurt. Full-fat cheese. Nondairy creamers. Whipped toppings. Processed cheese and cheese spreads. Fats and oils  Butter. Stick margarine. Lard. Shortening. Ghee. Bacon fat. Tropical oils, such as coconut, palm kernel, or palm oil. Seasoning and other foods  Salted popcorn and pretzels. Onion salt, garlic salt, seasoned salt, table salt, and sea salt. Worcestershire sauce. Tartar sauce. Barbecue sauce. Teriyaki sauce. Soy sauce, including reduced-sodium. Steak sauce. Canned and packaged gravies. Fish sauce. Oyster sauce. Cocktail sauce. Horseradish that you find on the shelf. Ketchup. Mustard. Meat flavorings and tenderizers. Bouillon cubes. Hot sauce and Tabasco sauce. Premade or packaged marinades. Premade or packaged taco seasonings. Relishes. Regular salad dressings. Where to find more information:  National Heart, Lung, and [place]: https://wilson-eaton.com/  American Heart Association: www.heart.org Summary  The DASH eating plan is a healthy eating plan that has been shown to reduce high blood pressure (hypertension). It may also reduce your risk for type 2 diabetes, heart disease, and stroke.  With the DASH eating plan, you should limit salt (sodium) intake to 2,300 mg a day. If you have hypertension, you may need to reduce your sodium intake to 1,500 mg a day.  When on the DASH eating  plan, aim to eat more fresh fruits and vegetables, whole grains, lean proteins, low-fat dairy, and heart-healthy fats.  Work with your health care provider or diet and nutrition specialist (dietitian) to adjust your eating plan to your individual calorie needs. This information is not intended to replace advice given to you by your health care provider. Make sure you discuss any questions you have with your health care provider. Document Released: 07/03/2011 Document Revised: 07/07/2016 Document Reviewed: 07/07/2016 Elsevier Interactive  Patient Education  2017 [employer].

## 2016-11-17 ENCOUNTER — Ambulatory Visit (INDEPENDENT_AMBULATORY_CARE_PROVIDER_SITE_OTHER)
Admission: RE | Admit: 2016-11-17 | Discharge: 2016-11-17 | Disposition: A | Discharge status: Home / Self Care | Payer: Medicare Other | Source: Ambulatory Visit | Attending: Nurse Practitioner | Admitting: Nurse Practitioner

## 2016-11-17 DIAGNOSIS — M810 Age-related osteoporosis without current pathological fracture: Secondary | ICD-10-CM | POA: Diagnosis not present

## 2016-11-17 DIAGNOSIS — Z780 Asymptomatic menopausal state: Secondary | ICD-10-CM | POA: Diagnosis not present

## 2016-11-24 ENCOUNTER — Ambulatory Visit: Payer: Medicare Other

## 2016-11-25 ENCOUNTER — Encounter (HOSPITAL_COMMUNITY): Payer: Self-pay | Admitting: Obstetrics and Gynecology

## 2016-11-25 ENCOUNTER — Inpatient Hospital Stay (HOSPITAL_COMMUNITY)
Admission: EM | Admit: 2016-11-25 | Discharge: 2016-11-29 | DRG: 871 | Disposition: A | Discharge status: Home Health | Payer: Medicare Other | Attending: Family Medicine | Admitting: Family Medicine

## 2016-11-25 ENCOUNTER — Emergency Department (HOSPITAL_COMMUNITY): Payer: Medicare Other

## 2016-11-25 DIAGNOSIS — M810 Age-related osteoporosis without current pathological fracture: Secondary | ICD-10-CM | POA: Diagnosis present

## 2016-11-25 DIAGNOSIS — J440 Chronic obstructive pulmonary disease with acute lower respiratory infection: Secondary | ICD-10-CM | POA: Diagnosis not present

## 2016-11-25 DIAGNOSIS — A419 Sepsis, unspecified organism: Secondary | ICD-10-CM | POA: Diagnosis not present

## 2016-11-25 DIAGNOSIS — F419 Anxiety disorder, unspecified: Secondary | ICD-10-CM | POA: Diagnosis present

## 2016-11-25 DIAGNOSIS — Z9049 Acquired absence of other specified parts of digestive tract: Secondary | ICD-10-CM

## 2016-11-25 DIAGNOSIS — N179 Acute kidney failure, unspecified: Secondary | ICD-10-CM | POA: Diagnosis not present

## 2016-11-25 DIAGNOSIS — I10 Essential (primary) hypertension: Secondary | ICD-10-CM

## 2016-11-25 DIAGNOSIS — E876 Hypokalemia: Secondary | ICD-10-CM | POA: Diagnosis present

## 2016-11-25 DIAGNOSIS — R8271 Bacteriuria: Secondary | ICD-10-CM | POA: Diagnosis not present

## 2016-11-25 DIAGNOSIS — E872 Acidosis: Secondary | ICD-10-CM | POA: Diagnosis present

## 2016-11-25 DIAGNOSIS — I739 Peripheral vascular disease, unspecified: Secondary | ICD-10-CM | POA: Diagnosis present

## 2016-11-25 DIAGNOSIS — A411 Sepsis due to other specified staphylococcus: Principal | ICD-10-CM | POA: Diagnosis present

## 2016-11-25 DIAGNOSIS — A403 Sepsis due to Streptococcus pneumoniae: Secondary | ICD-10-CM | POA: Diagnosis not present

## 2016-11-25 DIAGNOSIS — H9190 Unspecified hearing loss, unspecified ear: Secondary | ICD-10-CM | POA: Diagnosis not present

## 2016-11-25 DIAGNOSIS — I252 Old myocardial infarction: Secondary | ICD-10-CM

## 2016-11-25 DIAGNOSIS — Z8673 Personal history of transient ischemic attack (TIA), and cerebral infarction without residual deficits: Secondary | ICD-10-CM

## 2016-11-25 DIAGNOSIS — J13 Pneumonia due to Streptococcus pneumoniae: Secondary | ICD-10-CM | POA: Diagnosis not present

## 2016-11-25 DIAGNOSIS — K219 Gastro-esophageal reflux disease without esophagitis: Secondary | ICD-10-CM | POA: Diagnosis not present

## 2016-11-25 DIAGNOSIS — Z90710 Acquired absence of both cervix and uterus: Secondary | ICD-10-CM | POA: Diagnosis not present

## 2016-11-25 DIAGNOSIS — Z888 Allergy status to other drugs, medicaments and biological substances status: Secondary | ICD-10-CM

## 2016-11-25 DIAGNOSIS — J9601 Acute respiratory failure with hypoxia: Secondary | ICD-10-CM

## 2016-11-25 DIAGNOSIS — Z822 Family history of deafness and hearing loss: Secondary | ICD-10-CM | POA: Diagnosis not present

## 2016-11-25 DIAGNOSIS — J1000 Influenza due to other identified influenza virus with unspecified type of pneumonia: Secondary | ICD-10-CM | POA: Diagnosis not present

## 2016-11-25 DIAGNOSIS — R05 Cough: Secondary | ICD-10-CM | POA: Diagnosis not present

## 2016-11-25 DIAGNOSIS — I2510 Atherosclerotic heart disease of native coronary artery without angina pectoris: Secondary | ICD-10-CM | POA: Diagnosis present

## 2016-11-25 DIAGNOSIS — Z7982 Long term (current) use of aspirin: Secondary | ICD-10-CM

## 2016-11-25 DIAGNOSIS — E785 Hyperlipidemia, unspecified: Secondary | ICD-10-CM | POA: Diagnosis not present

## 2016-11-25 DIAGNOSIS — R7881 Bacteremia: Secondary | ICD-10-CM | POA: Diagnosis not present

## 2016-11-25 DIAGNOSIS — Z95828 Presence of other vascular implants and grafts: Secondary | ICD-10-CM

## 2016-11-25 DIAGNOSIS — Z87891 Personal history of nicotine dependence: Secondary | ICD-10-CM | POA: Diagnosis not present

## 2016-11-25 DIAGNOSIS — J1008 Influenza due to other identified influenza virus with other specified pneumonia: Secondary | ICD-10-CM | POA: Diagnosis present

## 2016-11-25 DIAGNOSIS — J181 Lobar pneumonia, unspecified organism: Secondary | ICD-10-CM | POA: Diagnosis not present

## 2016-11-25 DIAGNOSIS — R0602 Shortness of breath: Secondary | ICD-10-CM | POA: Diagnosis not present

## 2016-11-25 DIAGNOSIS — Z8261 Family history of arthritis: Secondary | ICD-10-CM | POA: Diagnosis not present

## 2016-11-25 DIAGNOSIS — J449 Chronic obstructive pulmonary disease, unspecified: Secondary | ICD-10-CM | POA: Diagnosis not present

## 2016-11-25 DIAGNOSIS — J189 Pneumonia, unspecified organism: Secondary | ICD-10-CM | POA: Diagnosis not present

## 2016-11-25 DIAGNOSIS — K449 Diaphragmatic hernia without obstruction or gangrene: Secondary | ICD-10-CM | POA: Diagnosis present

## 2016-11-25 DIAGNOSIS — Z800 Family history of malignant neoplasm of digestive organs: Secondary | ICD-10-CM | POA: Diagnosis not present

## 2016-11-25 DIAGNOSIS — Z7952 Long term (current) use of systemic steroids: Secondary | ICD-10-CM

## 2016-11-25 DIAGNOSIS — Z79899 Other long term (current) drug therapy: Secondary | ICD-10-CM

## 2016-11-25 DIAGNOSIS — B957 Other staphylococcus as the cause of diseases classified elsewhere: Secondary | ICD-10-CM

## 2016-11-25 DIAGNOSIS — J101 Influenza due to other identified influenza virus with other respiratory manifestations: Secondary | ICD-10-CM | POA: Diagnosis not present

## 2016-11-25 LAB — URINALYSIS, ROUTINE W REFLEX MICROSCOPIC
BILIRUBIN URINE: NEGATIVE
Glucose, UA: NEGATIVE mg/dL
Hgb urine dipstick: NEGATIVE
KETONES UR: NEGATIVE mg/dL
NITRITE: NEGATIVE
Protein, ur: NEGATIVE mg/dL
Specific Gravity, Urine: 1.009 (ref 1.005–1.030)
pH: 6 (ref 5.0–8.0)

## 2016-11-25 LAB — EXPECTORATED SPUTUM ASSESSMENT W REFEX TO RESP CULTURE

## 2016-11-25 LAB — COMPREHENSIVE METABOLIC PANEL
ALBUMIN: 3.1 g/dL — AB (ref 3.5–5.0)
ALT: 19 U/L (ref 14–54)
ANION GAP: 14 (ref 5–15)
AST: 23 U/L (ref 15–41)
Alkaline Phosphatase: 33 U/L — ABNORMAL LOW (ref 38–126)
BILIRUBIN TOTAL: 0.8 mg/dL (ref 0.3–1.2)
BUN: 12 mg/dL (ref 6–20)
CHLORIDE: 98 mmol/L — AB (ref 101–111)
CO2: 25 mmol/L (ref 22–32)
Calcium: 8.3 mg/dL — ABNORMAL LOW (ref 8.9–10.3)
Creatinine, Ser: 1.01 mg/dL — ABNORMAL HIGH (ref 0.44–1.00)
GFR calc Af Amer: >60 mL/min (ref >60)
GFR calc non Af Amer: 53 mL/min — ABNORMAL LOW (ref >60)
GLUCOSE: 156 mg/dL — AB (ref 65–99)
POTASSIUM: 3 mmol/L — AB (ref 3.5–5.1)
SODIUM: 137 mmol/L (ref 135–145)
TOTAL PROTEIN: 7.2 g/dL (ref 6.5–8.1)

## 2016-11-25 LAB — CBC WITH DIFFERENTIAL/PLATELET
BASOS ABS: 0.1 10*3/uL (ref 0.0–0.1)
Basophils Relative: 1 %
EOS PCT: 0 %
Eosinophils Absolute: 0 10*3/uL (ref 0.0–0.7)
HEMATOCRIT: 48.2 % — AB (ref 36.0–46.0)
HEMOGLOBIN: 15.8 g/dL — AB (ref 12.0–15.0)
LYMPHS ABS: 1.1 10*3/uL (ref 0.7–4.0)
LYMPHS PCT: 8 %
MCH: 30.4 pg (ref 26.0–34.0)
MCHC: 32.8 g/dL (ref 30.0–36.0)
MCV: 92.9 fL (ref 78.0–100.0)
Monocytes Absolute: 1.1 10*3/uL — ABNORMAL HIGH (ref 0.1–1.0)
Monocytes Relative: 8 %
NEUTROS ABS: 11.2 10*3/uL — AB (ref 1.7–7.7)
Neutrophils Relative %: 83 %
Platelets: 204 10*3/uL (ref 150–400)
RBC: 5.19 MIL/uL — AB (ref 3.87–5.11)
RDW: 13.7 % (ref 11.5–15.5)
WBC: 13.5 10*3/uL — ABNORMAL HIGH (ref 4.0–10.5)

## 2016-11-25 LAB — MRSA PCR SCREENING: MRSA BY PCR: NEGATIVE

## 2016-11-25 LAB — CBC
HCT: 40.6 % (ref 36.0–46.0)
Hemoglobin: 13 g/dL (ref 12.0–15.0)
MCH: 29.5 pg (ref 26.0–34.0)
MCHC: 32 g/dL (ref 30.0–36.0)
MCV: 92.3 fL (ref 78.0–100.0)
PLATELETS: 185 10*3/uL (ref 150–400)
RBC: 4.4 MIL/uL (ref 3.87–5.11)
RDW: 13.9 % (ref 11.5–15.5)
WBC: 14.3 10*3/uL — AB (ref 4.0–10.5)

## 2016-11-25 LAB — LACTIC ACID, PLASMA
Lactic Acid, Venous: 2.6 mmol/L (ref 0.5–1.9)
Lactic Acid, Venous: 3.5 mmol/L (ref 0.5–1.9)
Lactic Acid, Venous: 1.8 mmol/L (ref 0.5–1.9)
Lactic Acid, Venous: 1.5 mmol/L (ref 0.5–1.9)

## 2016-11-25 LAB — INFLUENZA PANEL BY PCR (TYPE A & B)
INFLAPCR: NEGATIVE
INFLBPCR: POSITIVE — AB

## 2016-11-25 LAB — I-STAT CG4 LACTIC ACID, ED: LACTIC ACID, VENOUS: 2.29 mmol/L — AB (ref 0.5–1.9)

## 2016-11-25 LAB — CREATININE, SERUM
CREATININE: 0.98 mg/dL (ref 0.44–1.00)
GFR, EST NON AFRICAN AMERICAN: 55 mL/min — AB (ref >60)

## 2016-11-25 LAB — PROCALCITONIN: Procalcitonin: 1.11 ng/mL

## 2016-11-25 LAB — EXPECTORATED SPUTUM ASSESSMENT W GRAM STAIN, RFLX TO RESP C

## 2016-11-25 LAB — TSH: TSH: 0.604 u[IU]/mL (ref 0.350–4.500)

## 2016-11-25 MED ORDER — ONDANSETRON HCL 4 MG PO TABS: 4.0000 mg | ORAL_TABLET | Freq: Four times a day (QID) | ORAL | Status: DC | PRN

## 2016-11-25 MED ORDER — ALPRAZOLAM 0.5 MG PO TABS
0.5000 mg | ORAL_TABLET | Freq: Two times a day (BID) | ORAL | Status: DC | PRN
  Administered 2016-11-25 – 2016-11-28 (×3): 0.5 mg via ORAL
  Filled 2016-11-25 (×3): qty 1

## 2016-11-25 MED ORDER — GABAPENTIN 100 MG PO CAPS
100.0000 mg | ORAL_CAPSULE | Freq: Three times a day (TID) | ORAL | Status: DC
  Administered 2016-11-25 – 2016-11-29 (×13): 100 mg via ORAL
  Filled 2016-11-25 (×13): qty 1

## 2016-11-25 MED ORDER — DEXTROSE 5 % IV SOLN: 1.0000 g | Freq: Once | INTRAVENOUS | Status: DC

## 2016-11-25 MED ORDER — SODIUM CHLORIDE 0.9 % IV BOLUS (SEPSIS)
30.0000 mL/kg | Freq: Once | INTRAVENOUS | Status: AC
  Administered 2016-11-25: 1785 mL via INTRAVENOUS

## 2016-11-25 MED ORDER — DEXTROSE 5 % IV SOLN
1.0000 g | INTRAVENOUS | Status: DC
  Administered 2016-11-26: 1 g via INTRAVENOUS
  Filled 2016-11-25: qty 10

## 2016-11-25 MED ORDER — POTASSIUM CHLORIDE CRYS ER 20 MEQ PO TBCR
40.0000 meq | EXTENDED_RELEASE_TABLET | Freq: Once | ORAL | Status: AC
  Administered 2016-11-25: 40 meq via ORAL
  Filled 2016-11-25: qty 2

## 2016-11-25 MED ORDER — TIOTROPIUM BROMIDE MONOHYDRATE 18 MCG IN CAPS
18.0000 ug | ORAL_CAPSULE | Freq: Every day | RESPIRATORY_TRACT | Status: DC
  Administered 2016-11-25 – 2016-11-28 (×3): 18 ug via RESPIRATORY_TRACT
  Filled 2016-11-25: qty 5

## 2016-11-25 MED ORDER — FENOFIBRATE 160 MG PO TABS
160.0000 mg | ORAL_TABLET | Freq: Every day | ORAL | Status: DC
  Administered 2016-11-25 – 2016-11-29 (×5): 160 mg via ORAL
  Filled 2016-11-25 (×5): qty 1

## 2016-11-25 MED ORDER — CHLORHEXIDINE GLUCONATE 0.12 % MT SOLN
15.0000 mL | Freq: Two times a day (BID) | OROMUCOSAL | Status: DC
  Administered 2016-11-25 – 2016-11-29 (×8): 15 mL via OROMUCOSAL
  Filled 2016-11-25 (×8): qty 15

## 2016-11-25 MED ORDER — ASPIRIN 81 MG PO CHEW
81.0000 mg | CHEWABLE_TABLET | Freq: Every day | ORAL | Status: DC
  Administered 2016-11-25 – 2016-11-29 (×5): 81 mg via ORAL
  Filled 2016-11-25 (×5): qty 1

## 2016-11-25 MED ORDER — ORAL CARE MOUTH RINSE
15.0000 mL | Freq: Two times a day (BID) | OROMUCOSAL | Status: DC
  Administered 2016-11-25 – 2016-11-29 (×7): 15 mL via OROMUCOSAL

## 2016-11-25 MED ORDER — PRAVASTATIN SODIUM 40 MG PO TABS
40.0000 mg | ORAL_TABLET | Freq: Every day | ORAL | Status: DC
  Administered 2016-11-25 – 2016-11-28 (×4): 40 mg via ORAL
  Filled 2016-11-25 (×3): qty 1
  Filled 2016-11-25: qty 2

## 2016-11-25 MED ORDER — DEXTROSE 5 % IV SOLN: 500.0000 mg | Freq: Once | INTRAVENOUS | Status: DC

## 2016-11-25 MED ORDER — DEXTROSE 5 % IV SOLN
500.0000 mg | Freq: Once | INTRAVENOUS | Status: AC
  Administered 2016-11-25: 500 mg via INTRAVENOUS
  Filled 2016-11-25: qty 500

## 2016-11-25 MED ORDER — DEXTROSE 5 % IV SOLN
1.0000 g | Freq: Once | INTRAVENOUS | Status: AC
  Administered 2016-11-25: 1 g via INTRAVENOUS
  Filled 2016-11-25: qty 10

## 2016-11-25 MED ORDER — ACETAMINOPHEN 325 MG PO TABS
650.0000 mg | ORAL_TABLET | Freq: Once | ORAL | Status: AC
  Administered 2016-11-25: 650 mg via ORAL
  Filled 2016-11-25: qty 2

## 2016-11-25 MED ORDER — ALBUTEROL SULFATE (2.5 MG/3ML) 0.083% IN NEBU
2.5000 mg | INHALATION_SOLUTION | Freq: Four times a day (QID) | RESPIRATORY_TRACT | Status: DC | PRN
  Administered 2016-11-25: 2.5 mg via RESPIRATORY_TRACT
  Filled 2016-11-25: qty 3

## 2016-11-25 MED ORDER — ONDANSETRON HCL 4 MG/2ML IJ SOLN: 4.0000 mg | Freq: Four times a day (QID) | INTRAMUSCULAR | Status: DC | PRN

## 2016-11-25 MED ORDER — PANTOPRAZOLE SODIUM 40 MG PO TBEC
40.0000 mg | DELAYED_RELEASE_TABLET | Freq: Every day | ORAL | Status: DC
  Administered 2016-11-25 – 2016-11-29 (×5): 40 mg via ORAL
  Filled 2016-11-25 (×5): qty 1

## 2016-11-25 MED ORDER — LORATADINE 10 MG PO TABS
10.0000 mg | ORAL_TABLET | Freq: Every day | ORAL | Status: DC
  Administered 2016-11-25 – 2016-11-29 (×5): 10 mg via ORAL
  Filled 2016-11-25 (×5): qty 1

## 2016-11-25 MED ORDER — FENTANYL CITRATE (PF) 100 MCG/2ML IJ SOLN
50.0000 ug | Freq: Once | INTRAMUSCULAR | Status: AC
  Administered 2016-11-25: 50 ug via INTRAVENOUS
  Filled 2016-11-25: qty 2

## 2016-11-25 MED ORDER — ENOXAPARIN SODIUM 30 MG/0.3ML ~~LOC~~ SOLN: 30.0000 mg | SUBCUTANEOUS | Status: DC

## 2016-11-25 MED ORDER — RALOXIFENE HCL 60 MG PO TABS
60.0000 mg | ORAL_TABLET | Freq: Every day | ORAL | Status: DC
  Administered 2016-11-25 – 2016-11-29 (×5): 60 mg via ORAL
  Filled 2016-11-25 (×5): qty 1

## 2016-11-25 MED ORDER — COLESEVELAM HCL 625 MG PO TABS
625.0000 mg | ORAL_TABLET | Freq: Two times a day (BID) | ORAL | Status: DC
  Administered 2016-11-25 – 2016-11-29 (×9): 625 mg via ORAL
  Filled 2016-11-25 (×10): qty 1

## 2016-11-25 MED ORDER — ACETAMINOPHEN 500 MG PO TABS: 500.0000 mg | ORAL_TABLET | Freq: Four times a day (QID) | ORAL | Status: DC | PRN

## 2016-11-25 MED ORDER — DEXTROSE 5 % IV SOLN
500.0000 mg | INTRAVENOUS | Status: DC
  Administered 2016-11-26: 500 mg via INTRAVENOUS
  Filled 2016-11-25 (×2): qty 500

## 2016-11-25 MED ORDER — ENOXAPARIN SODIUM 40 MG/0.4ML ~~LOC~~ SOLN
40.0000 mg | SUBCUTANEOUS | Status: DC
  Administered 2016-11-25 – 2016-11-28 (×4): 40 mg via SUBCUTANEOUS
  Filled 2016-11-25 (×4): qty 0.4

## 2016-11-25 MED ORDER — METHYLPREDNISOLONE SODIUM SUCC 125 MG IJ SOLR
125.0000 mg | Freq: Once | INTRAMUSCULAR | Status: DC
  Filled 2016-11-25: qty 2

## 2016-11-25 MED ORDER — SODIUM CHLORIDE 0.9% FLUSH
3.0000 mL | Freq: Two times a day (BID) | INTRAVENOUS | Status: DC
  Administered 2016-11-25 – 2016-11-29 (×8): 3 mL via INTRAVENOUS

## 2016-11-25 MED ORDER — SODIUM CHLORIDE 0.9 % IV SOLN
INTRAVENOUS | Status: DC
  Administered 2016-11-25 (×2): via INTRAVENOUS

## 2016-11-25 MED ORDER — OSELTAMIVIR PHOSPHATE 30 MG PO CAPS
30.0000 mg | ORAL_CAPSULE | Freq: Two times a day (BID) | ORAL | Status: DC
  Administered 2016-11-25 – 2016-11-29 (×9): 30 mg via ORAL
  Filled 2016-11-25 (×10): qty 1

## 2016-11-25 NOTE — ED Notes (Signed)
ED Provider at bedside. 

## 2016-11-25 NOTE — ED Notes (Signed)
MD and RN made aware of elevated lactic 

## 2016-11-25 NOTE — ED Triage Notes (Signed)
Per EMS:   Patient coming from home. Started feeling sick about 2 weeks ago and started staying with daughter. Pt had difficulty breathing and productive cough with white sputum. Pt sounds junky throughout all lung fields.  85% on RA Pt given 10 of albuterol and 0.5 of atrovent and that brought her up to 93%. Pt placed on non-rebreather and sats up to 96%.  Prednisone allergy  137/59 HR 130  20 RAC

## 2016-11-25 NOTE — ED Notes (Signed)
Hospitalist at bedside 

## 2016-11-25 NOTE — H&P (Signed)
History and Physical    Courtney Brown:[id number] DOB: [birth year] DOA: 11/25/2016  Referring MD/NP/PA: Dr. Venora Maples  PCP: Wilfred Lacy, NP    Patient coming from: home   Chief Complaint: shortness of breath  HPI: Courtney Brown is a 76 y.o. female with medical history significant for dyslipidemia who presented to [facility] ED with reports of shortness of breath , feeling weak and tired for past 2 weeks and getting worse over past day or so. Pt reported cough productive of white sputum, fevers and chills. No chest pain, palpitations. No abdominal pain, nausea or vomiting. No falls, no loss of consciousness. No urinary complaints.    ED Course: On admission, T max was 103.2 F, HR 30, RR 132, oxygen saturation 89% on room air which has improved to 95% with [place] oxygen support. CXR showed pneumonia in right and possibly left lung. Blood work showed WBC count 13.5, potassium 3.0 (supplemented), lactic acid 2.29. She was started on empiric abx (rocephin and zosyn). Her flu test for positive for influenza and she was started on tamiflu.  Review of Systems:  Constitutional: Negative for fever, chills, diaphoresis, activity change, appetite change and fatigue.  HENT: Negative for ear pain, nosebleeds, congestion, facial swelling, rhinorrhea, neck pain, neck stiffness and ear discharge.   Eyes: Negative for pain, discharge, redness, itching and visual disturbance.  Respiratory: per HPI  Cardiovascular: Negative for chest pain, palpitations and leg swelling.  Gastrointestinal: Negative for abdominal distention.  Genitourinary: Negative for dysuria, urgency, frequency, hematuria, flank pain, decreased urine volume, difficulty urinating and dyspareunia.  Musculoskeletal: Negative for back pain, joint swelling, arthralgias and gait problem.  Neurological: Negative for dizziness, tremors, seizures, syncope, facial asymmetry, speech difficulty, weakness, light-headedness, numbness and headaches.  Hematological:  Negative for adenopathy. Does not bruise/bleed easily.  Psychiatric/Behavioral: Negative for hallucinations, behavioral problems, confusion, dysphoric mood, decreased concentration and agitation.   Past Medical History:  Diagnosis Date  . Allergy   . Anxiety   . Colon polyps   . COPD (chronic obstructive pulmonary disease) ([place])   . Emphysema of lung ([place])   . GERD (gastroesophageal reflux disease)   . H/O hiatal hernia   . Hyperlipidemia   . Hypertension   . Myocardial infarct, old 2008   no stent placed  . Osteoporosis   . Stroke [facility])    TIA    Past Surgical History:  Procedure Laterality Date  . ABDOMINAL HYSTERECTOMY  1998  . 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 HERNIA REPAIR     umbilical, ventral  . INSERTION OF ILIAC STENT  2008    Social history:  reports that she has quit smoking. She has a 22.50 pack-year smoking history. She has never used smokeless tobacco. She reports that she drinks about 1.2 oz of alcohol per week . She reports that she does not use drugs.  Ambulation: ambulates without assistance at baseline   Allergies  Allergen Reactions  . Prednisone Other (See Comments)    Leg pain,light headed    Family History  Problem Relation Age of Onset  . Hearing loss Father   . Hearing loss Maternal Aunt   . Hearing loss Maternal Grandmother   . Cancer Maternal Grandmother     colon  . Arthritis Mother   . Cancer Paternal Grandmother     colon    Prior to Admission medications   Medication Sig Start Date End Date Taking? Authorizing Provider  acetaminophen (TYLENOL) 500 MG tablet Take 500 mg  by mouth every 6 (six) hours as needed.   Yes Historical Provider, MD  albuterol (PROVENTIL HFA;VENTOLIN HFA) 108 (90 Base) MCG/ACT inhaler Inhale 1-2 puffs into the lungs every 6 (six) hours as needed for wheezing or shortness of breath. 2 puff by mouth every 4 hours PRN 11/03/16  Yes Charlene Brooke Nche, NP  ALPRAZolam (XANAX) 0.25 MG tablet TAKE  1 TABLET AT BEDTIME AS NEEDED FOR ANXIETY 11/03/16  Yes Charlene Brooke Nche, NP  amLODipine (NORVASC) 5 MG tablet Take 1 tablet (5 mg total) by mouth at bedtime. Must keep 11/03/16 appt for future refills 11/03/16  Yes Flossie Buffy, NP  aspirin 81 MG tablet Take 81 mg by mouth daily.   Yes Historical Provider, MD  dextromethorphan-guaiFENesin (MUCINEX DM) 30-600 MG 12hr tablet Take 1 tablet by mouth 2 (two) times daily as needed for cough. 07/29/16  Yes Charlene Brooke Nche, NP  diphenoxylate-atropine (LOMOTIL) 2.5-0.025 MG tablet Take by mouth 4 (four) times daily as needed for diarrhea or loose stools.   Yes Historical Provider, MD  fenofibrate (TRICOR) 145 MG tablet Take 1 tablet (145 mg total) by mouth daily. 11/03/16  Yes Flossie Buffy, NP  Ferrous Gluconate-C-Folic Acid (IRON-C PO) Take 65 mg by mouth 1 day or 1 dose.   Yes Historical Provider, MD  gabapentin (NEURONTIN) 100 MG capsule Take 1 capsule (100 mg total) by mouth 3 (three) times daily. For shingle 11/03/16  Yes Charlene Brooke Nche, NP  Garlic Oil 8588 MG CAPS Take 1,000 mg by mouth 1 day or 1 dose.   Yes Historical Provider, MD  Lactobacillus (PROBIOTIC ACIDOPHILUS) CAPS Take by mouth once.   Yes Historical Provider, MD  loratadine (CLARITIN) 10 MG tablet Take 10 mg by mouth daily. 06/02/16  Yes Historical Provider, MD  lovastatin (MEVACOR) 40 MG tablet Take 1 tablet (40 mg total) by mouth at bedtime. Must keep 11/03/16 appt for future refills 11/03/16  Yes Flossie Buffy, NP  metoprolol succinate (TOPROL-XL) 50 MG 24 hr tablet Take 1 tablet (50 mg total) by mouth 2 (two) times daily. 11/03/16  Yes Charlene Brooke Nche, NP  omeprazole (PRILOSEC) 20 MG capsule Take 1 capsule (20 mg total) by mouth daily. 11/03/16  Yes Charlene Brooke Nche, NP  ondansetron (ZOFRAN) 4 MG tablet Take 4 mg by mouth every 8 (eight) hours as needed for nausea or vomiting.   Yes Historical Provider, MD  raloxifene (EVISTA) 60 MG tablet Take 1 tablet (60 mg total) by mouth  daily. 11/03/16  Yes Flossie Buffy, NP  SYMBICORT 160-4.5 MCG/ACT inhaler Inhale 2 puffs into the lungs 2 (two) times daily. Inhale 1-2 puffs twice a day 07/29/16  Yes Charlene Brooke Nche, NP  tiotropium (SPIRIVA) 18 MCG inhalation capsule Place 1 capsule (18 mcg total) into inhaler and inhale daily. 10/31/16  Yes Flossie Buffy, NP  WELCHOL 625 MG tablet Take 1 tablet (625 mg total) by mouth 2 (two) times daily with a meal. 11/03/16  Yes Flossie Buffy, NP  Elastic Bandages & Supports (KNEE COMPRESSION SLEEVE/L/XL) MISC 1 Units by Does not apply route daily. 11/03/16   Flossie Buffy, NP    Physical Exam: Vitals:   11/25/16 1300 11/25/16 1330 11/25/16 1400 11/25/16 1500  BP: 121/63 (!) 106/50 (!) 106/50   Pulse: (!) 113 (!) 107 (!) 101 94  Resp: 19 19 16 18   Temp:    99.1 F (37.3 C)  TempSrc:    Oral  SpO2: (!) 89% 92%  92% 95%  Weight:    78.2 kg (172 lb 6.4 oz)  Height:    5' 1"  (1.549 m)    Constitutional: NAD, calm, comfortable Vitals:   11/25/16 1300 11/25/16 1330 11/25/16 1400 11/25/16 1500  BP: 121/63 (!) 106/50 (!) 106/50   Pulse: (!) 113 (!) 107 (!) 101 94  Resp: 19 19 16 18   Temp:    99.1 F (37.3 C)  TempSrc:    Oral  SpO2: (!) 89% 92% 92% 95%  Weight:    78.2 kg (172 lb 6.4 oz)  Height:    5' 1"  (1.549 m)   Eyes: PERRL, lids and conjunctivae normal ENMT: Mucous membranes are moist. Posterior pharynx clear of any exudate or lesions.Normal dentition.  Neck: normal, supple, no masses, no thyromegaly Respiratory: diminished breath sounds, rhonchi in right lung Cardiovascular: Rate controlled, appreciate S1-S1   Abdomen: no tenderness, no masses palpated. No hepatosplenomegaly. Bowel sounds positive.  Musculoskeletal: no clubbing / cyanosis. No joint deformity upper and lower extremities. Good ROM, no contractures. Normal muscle tone.  Skin: no rashes, lesions, ulcers. No induration Neurologic: CN 2-12 grossly intact. Sensation intact, DTR normal. Strength 5/5  in all 4.  Psychiatric: Normal judgment and insight. Alert and oriented x 3. Normal mood.   Labs on Admission: I have personally reviewed following labs and imaging studies  CBC:  Recent Labs Lab 11/25/16 1225 11/25/16 1456  WBC 13.5* 14.3*  NEUTROABS 11.2*  --   HGB 15.8* 13.0  HCT 48.2* 40.6  MCV 92.9 92.3  PLT 204 098   Basic Metabolic Panel:  Recent Labs Lab 11/25/16 1225 11/25/16 1456  NA 137  --   K 3.0*  --   CL 98*  --   CO2 25  --   GLUCOSE 156*  --   BUN 12  --   CREATININE 1.01* 0.98  CALCIUM 8.3*  --    GFR: Estimated Creatinine Clearance: 47 mL/min (by C-G formula based on SCr of 0.98 mg/dL). Liver Function Tests:  Recent Labs Lab 11/25/16 1225  AST 23  ALT 19  ALKPHOS 33*  BILITOT 0.8  PROT 7.2  ALBUMIN 3.1*   No results for input(s): LIPASE, AMYLASE in the last 168 hours. No results for input(s): AMMONIA in the last 168 hours. Coagulation Profile: No results for input(s): INR, PROTIME in the last 168 hours. Cardiac Enzymes: No results for input(s): CKTOTAL, CKMB, CKMBINDEX, TROPONINI in the last 168 hours. BNP (last 3 results) No results for input(s): PROBNP in the last 8760 hours. HbA1C: No results for input(s): HGBA1C in the last 72 hours. CBG: No results for input(s): GLUCAP in the last 168 hours. Lipid Profile: No results for input(s): CHOL, HDL, LDLCALC, TRIG, CHOLHDL, LDLDIRECT in the last 72 hours. Thyroid Function Tests: No results for input(s): TSH, T4TOTAL, FREET4, T3FREE, THYROIDAB in the last 72 hours. Anemia Panel: No results for input(s): VITAMINB12, FOLATE, FERRITIN, TIBC, IRON, RETICCTPCT in the last 72 hours. Urine analysis: No results found for: COLORURINE, APPEARANCEUR, LABSPEC, PHURINE, GLUCOSEU, HGBUR, BILIRUBINUR, KETONESUR, PROTEINUR, UROBILINOGEN, NITRITE, LEUKOCYTESUR Sepsis Labs: @LABRCNTIP (procalcitonin:4,lacticidven:4) )No results found for this or any previous visit (from the past 240 hour(s)).    Radiological Exams on Admission: Dg Chest Port 1 View Result Date: 11/25/2016 Waynetta Sandy consolidation in the right mid lung and to a lesser extent in the left base consistent with pneumonia. Lungs elsewhere clear. Stable cardiac prominence. There is aortic atherosclerosis. Followup PA and lateral chest radiographs recommended in 3-4 weeks following trial of  antibiotic therapy to ensure resolution and exclude underlying malignancy.   EKG: pending   Assessment/Plan  Active Problems: Sepsis due to pneumonia / Influenza pneumonia / leukocytosis and lactic acidosis / Acute respiratory failure with hypoxia - Sepsis criteria met on admission with fever of 103.59F, tachycardia, tachypnea, hypoxia, leukocytosis and lactic acidosis - Sepsis and pneumonia order set utilized - CXR on admission showed right mid lung and left base consistent with pneumonia; follow up CXR needed to exclude malignancy  - Influenza PCR positive for Influenza B; started tamiflu - On empiric rocephin and azithromycin while awaiting blood cx results - Strep pneumonia is pending - Resp cx is pending - Monitor in SDU  Hypokalemia - Due to sepsis  - Supplemented - Follow up BMP in am  Dyslipidemia - Continue welchol and fenofibrate  - Continue Pravachol   Acute kidney injury - Due to sepsis - Improved with fluids       DVT prophylaxis: SCD's bilaterally  Code Status: full code Family Communication: no family at the bedside  Disposition Plan: SDU Consults called: none  Admission status: Inpatient, pt needs admission for sepsis, pneumonia and SDU admission, empiric abx and tamiflu for influenza pneumonia.    Leisa Lenz MD [facility] Pager [phone]  If 7PM-7AM, please contact night-coverage [email or web address] Password [facility]  11/25/2016, 3:48 PM

## 2016-11-25 NOTE — ED Notes (Signed)
Two sets of blood cultures sent to the lab

## 2016-11-25 NOTE — ED Notes (Signed)
Spoke with Langley Gauss on 2W. 20 minute timer started.

## 2016-11-25 NOTE — ED Notes (Signed)
Bed: LT90 Expected date:  Expected time:  Means of arrival:  Comments: EMS 70s, SOB

## 2016-11-25 NOTE — ED Notes (Signed)
Pt and family concerned about the Solu-Mederol, Physician notified and said to hold medication for now

## 2016-11-25 NOTE — Progress Notes (Signed)
Pharmacy Antibiotic Note  Courtney Brown is a 76 y.o. female admitted on 11/25/2016 with pneumonia.  Pt is from home, started feeling sick ~ 2 weeks ago.  Pt had difficulty breathing and productive cough with white sputum. Hx of COPD.  Pharmacy has been consulted for azithromycin and ceftriaxone dosing.  Plan: Azithromycin 500mg  IV q24h Ceftriaxone 1gm IV q24h Pharmacy will sign off as no adjustment needed in renal dysfunction  Height: 5\' 1"  (154.9 cm) Weight: 170 lb (77.1 kg) IBW/kg (Calculated) : 47.8  Temp (24hrs), Avg:103.2 F (39.6 C), Min:103.2 F (39.6 C), Max:103.2 F (39.6 C)  No results for input(s): WBC, CREATININE, LATICACIDVEN, VANCOTROUGH, VANCOPEAK, VANCORANDOM, GENTTROUGH, GENTPEAK, GENTRANDOM, TOBRATROUGH, TOBRAPEAK, TOBRARND, AMIKACINPEAK, AMIKACINTROU, AMIKACIN in the last 168 hours.  CrCl cannot be calculated (Patient's most recent lab result is older than the maximum 21 days allowed.).    Allergies  Allergen Reactions  . Prednisone Other (See Comments)    Leg pain,light headed    Antimicrobials this admission: 5/1 ceftriaxone >> 5/1 azithromycin >>  Microbiology results: 5/1 BCx: sent   Thank you for allowing pharmacy to be a part of this patient's care.  Dolly Rias RPh 11/25/2016, 12:36 PM Pager [phone]

## 2016-11-25 NOTE — Progress Notes (Signed)
PHARMACY - LOVENOX  Lovenox 30mg  sq q24h for VTE ordered for patient with request for Pharmacy to adjust dose as needed.  Height = 61 inches Weight = 78.2 kg Scr = 1.01 with CrCl = 45 ml/min  Assessment:  Weight > 45 kg and CrCl > 30 ml/min therefore full VTE dose of Lovenox appropriate  Plan: Change Lovenox to 40mg  sq q24h for VTE prophylaxis  Leone Haven, PharmD 11/25/16 @ 15:30

## 2016-11-25 NOTE — ED Notes (Signed)
Portable CXR at bedside.

## 2016-11-25 NOTE — Progress Notes (Signed)
CRITICAL VALUE ALERT  Critical value received: lactic acid 2.6  Date of notification:  11/25/16   Time of notification:  4196  Critical value read back:yes  Nurse who received alert: Floyce Stakes  MD notified (1st page):Dr Charlies Silvers  Time of first page:  45  MD notified (2nd page):  Time of second page:  Responding MD:  Dr Charlies Silvers  Time MD responded: 1545, orders to decrease ivf to 9ml/hr

## 2016-11-25 NOTE — Progress Notes (Addendum)
CRITICAL VALUE ALERT  Critical value received:  Lactic acid 3.5  Date of notification:  11/25/16  Time of notification: 7218  Critical value read back:yes  Nurse who received alert:  Floyce Stakes, RN  MD notified (1st page):  Dr Charlies Silvers  Time of first page:  1845  MD notified (2nd page):  Time of second page:  Responding MD:  Dr Charlies Silvers returned call and states no new orders, no increase in fluids at this time.   Time MD responded:  2883

## 2016-11-25 NOTE — Progress Notes (Signed)
Report from Manuela Schwartz, [place]. Care assumed for pt at this time. Pt sitting up in bed. Denies any c/o at present. Agree with previous RN assessment. Dtr bedside. Droplet precautions maintained. Will continue to monitor.

## 2016-11-26 DIAGNOSIS — Z8261 Family history of arthritis: Secondary | ICD-10-CM

## 2016-11-26 DIAGNOSIS — N179 Acute kidney failure, unspecified: Secondary | ICD-10-CM

## 2016-11-26 DIAGNOSIS — B957 Other staphylococcus as the cause of diseases classified elsewhere: Secondary | ICD-10-CM

## 2016-11-26 DIAGNOSIS — J189 Pneumonia, unspecified organism: Secondary | ICD-10-CM

## 2016-11-26 DIAGNOSIS — J101 Influenza due to other identified influenza virus with other respiratory manifestations: Secondary | ICD-10-CM

## 2016-11-26 DIAGNOSIS — R8271 Bacteriuria: Secondary | ICD-10-CM

## 2016-11-26 DIAGNOSIS — J9601 Acute respiratory failure with hypoxia: Secondary | ICD-10-CM

## 2016-11-26 DIAGNOSIS — A403 Sepsis due to Streptococcus pneumoniae: Secondary | ICD-10-CM

## 2016-11-26 DIAGNOSIS — R7881 Bacteremia: Secondary | ICD-10-CM

## 2016-11-26 DIAGNOSIS — Z822 Family history of deafness and hearing loss: Secondary | ICD-10-CM

## 2016-11-26 DIAGNOSIS — J1000 Influenza due to other identified influenza virus with unspecified type of pneumonia: Secondary | ICD-10-CM

## 2016-11-26 DIAGNOSIS — J13 Pneumonia due to Streptococcus pneumoniae: Secondary | ICD-10-CM

## 2016-11-26 DIAGNOSIS — J449 Chronic obstructive pulmonary disease, unspecified: Secondary | ICD-10-CM

## 2016-11-26 DIAGNOSIS — Z800 Family history of malignant neoplasm of digestive organs: Secondary | ICD-10-CM

## 2016-11-26 DIAGNOSIS — Z888 Allergy status to other drugs, medicaments and biological substances status: Secondary | ICD-10-CM

## 2016-11-26 DIAGNOSIS — Z87891 Personal history of nicotine dependence: Secondary | ICD-10-CM

## 2016-11-26 LAB — BASIC METABOLIC PANEL
Anion gap: 7 (ref 5–15)
BUN: 12 mg/dL (ref 6–20)
CHLORIDE: 106 mmol/L (ref 101–111)
CO2: 28 mmol/L (ref 22–32)
Calcium: 7.3 mg/dL — ABNORMAL LOW (ref 8.9–10.3)
Creatinine, Ser: 0.81 mg/dL (ref 0.44–1.00)
GFR calc non Af Amer: >60 mL/min (ref >60)
Glucose, Bld: 97 mg/dL (ref 65–99)
POTASSIUM: 3.7 mmol/L (ref 3.5–5.1)
SODIUM: 141 mmol/L (ref 135–145)

## 2016-11-26 LAB — BLOOD CULTURE ID PANEL (REFLEXED)
Acinetobacter baumannii: NOT DETECTED
CANDIDA ALBICANS: NOT DETECTED
CANDIDA GLABRATA: NOT DETECTED
CANDIDA TROPICALIS: NOT DETECTED
Candida krusei: NOT DETECTED
Candida parapsilosis: NOT DETECTED
ENTEROBACTER CLOACAE COMPLEX: NOT DETECTED
ENTEROBACTERIACEAE SPECIES: NOT DETECTED
Enterococcus species: NOT DETECTED
Escherichia coli: NOT DETECTED
Haemophilus influenzae: NOT DETECTED
KLEBSIELLA OXYTOCA: NOT DETECTED
KLEBSIELLA PNEUMONIAE: NOT DETECTED
Listeria monocytogenes: NOT DETECTED
Methicillin resistance: NOT DETECTED
NEISSERIA MENINGITIDIS: NOT DETECTED
Proteus species: NOT DETECTED
Pseudomonas aeruginosa: NOT DETECTED
STREPTOCOCCUS PNEUMONIAE: NOT DETECTED
STREPTOCOCCUS PYOGENES: NOT DETECTED
STREPTOCOCCUS SPECIES: NOT DETECTED
Serratia marcescens: NOT DETECTED
Staphylococcus aureus (BCID): NOT DETECTED
Staphylococcus species: DETECTED — AB
Streptococcus agalactiae: NOT DETECTED

## 2016-11-26 LAB — STREP PNEUMONIAE URINARY ANTIGEN: Strep Pneumo Urinary Antigen: POSITIVE — AB

## 2016-11-26 LAB — CBC
HEMATOCRIT: 37.2 % (ref 36.0–46.0)
Hemoglobin: 11.8 g/dL — ABNORMAL LOW (ref 12.0–15.0)
MCH: 29.4 pg (ref 26.0–34.0)
MCHC: 31.7 g/dL (ref 30.0–36.0)
MCV: 92.5 fL (ref 78.0–100.0)
PLATELETS: 189 10*3/uL (ref 150–400)
RBC: 4.02 MIL/uL (ref 3.87–5.11)
RDW: 14.2 % (ref 11.5–15.5)
WBC: 12.7 10*3/uL — AB (ref 4.0–10.5)

## 2016-11-26 LAB — HIV ANTIBODY (ROUTINE TESTING W REFLEX): HIV SCREEN 4TH GENERATION: NONREACTIVE

## 2016-11-26 LAB — GLUCOSE, CAPILLARY: GLUCOSE-CAPILLARY: 101 mg/dL — AB (ref 65–99)

## 2016-11-26 LAB — ALBUMIN: Albumin: 2.2 g/dL — ABNORMAL LOW (ref 3.5–5.0)

## 2016-11-26 MED ORDER — HYDROCODONE-HOMATROPINE 5-1.5 MG/5ML PO SYRP
5.0000 mL | ORAL_SOLUTION | ORAL | Status: DC | PRN
  Administered 2016-11-26 – 2016-11-29 (×2): 5 mL via ORAL
  Filled 2016-11-26 (×2): qty 5

## 2016-11-26 MED ORDER — ARFORMOTEROL TARTRATE 15 MCG/2ML IN NEBU
15.0000 ug | INHALATION_SOLUTION | Freq: Two times a day (BID) | RESPIRATORY_TRACT | Status: DC
  Administered 2016-11-26 – 2016-11-29 (×7): 15 ug via RESPIRATORY_TRACT
  Filled 2016-11-26 (×8): qty 2

## 2016-11-26 MED ORDER — DEXTROSE 5 % IV SOLN
2.0000 g | INTRAVENOUS | Status: DC
  Administered 2016-11-27 – 2016-11-28 (×2): 2 g via INTRAVENOUS
  Filled 2016-11-26 (×3): qty 2

## 2016-11-26 MED ORDER — DEXTROSE 5 % IV SOLN
1.0000 g | Freq: Once | INTRAVENOUS | Status: AC
  Administered 2016-11-26: 1 g via INTRAVENOUS
  Filled 2016-11-26: qty 10

## 2016-11-26 MED ORDER — BUDESONIDE 0.5 MG/2ML IN SUSP
0.5000 mg | Freq: Two times a day (BID) | RESPIRATORY_TRACT | Status: DC
  Administered 2016-11-26 – 2016-11-29 (×7): 0.5 mg via RESPIRATORY_TRACT
  Filled 2016-11-26 (×7): qty 2

## 2016-11-26 NOTE — Consult Note (Signed)
Date of Admission:  11/25/2016  Date of Consult:  11/26/2016  Reason for Consult: Coagulase Negative Staphylococcal Bacteremia Referring Physician: Dr. Elvera Lennox   HPI: Courtney Brown is an 76 y.o. female with hx of COPD w several week hx of what she thought was only URI treated with OTC meds but worsening SOB, weakness, and prompted by daughter to come to ED>   In ED febrile to 103, tachypenic, tachycardic, hypoxic. CXR with righ and Left lobar PNA. She was started on Ceftriaxone and azithromycin. Influenza B PCR +.  Pneumococcal ag +. Blood cultures are growing 2/2 Coag Negative Staphylococcal species that are NOT MR based on BCID (no MEC A gene). Culutres drawn 2 hours after first set are with NG but after patient had received IV azithromycin.   She has no hx of central line, recent HD, no hardware, PM.  NO cuts in skin, ulcers. NO painful joints.     Past Medical History:  Diagnosis Date  . Allergy   . Anxiety   . Colon polyps   . COPD (chronic obstructive pulmonary disease) ([place])   . Emphysema of lung ([place])   . GERD (gastroesophageal reflux disease)   . H/O hiatal hernia   . Hyperlipidemia   . Hypertension   . Myocardial infarct, old 2008   no stent placed  . Osteoporosis   . Stroke [facility])    TIA    Past Surgical History:  Procedure Laterality Date  . ABDOMINAL HYSTERECTOMY  1998  . 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 HERNIA REPAIR     umbilical, ventral  . INSERTION OF ILIAC STENT  2008    Social History:  reports that she has quit smoking. She has a 22.50 pack-year smoking history. She has never used smokeless tobacco. She reports that she drinks about 1.2 oz of alcohol per week . She reports that she does not use drugs.   Family History  Problem Relation Age of Onset  . Hearing loss Father   . Hearing loss Maternal Aunt   . Hearing loss Maternal Grandmother   . Cancer Maternal Grandmother     colon  . Arthritis Mother   . Cancer  Paternal Grandmother     colon    Allergies  Allergen Reactions  . Prednisone Other (See Comments)    Leg pain,light headed     Medications: I have reviewed patients current medications as documented in Epic Anti-infectives    Start     Dose/Rate Route Frequency Ordered Stop   11/27/16 1200  cefTRIAXone (ROCEPHIN) 2 g in dextrose 5 % 50 mL IVPB     2 g 100 mL/hr over 30 Minutes Intravenous Every 24 hours 11/26/16 1357     11/26/16 1415  cefTRIAXone (ROCEPHIN) 1 g in dextrose 5 % 50 mL IVPB     1 g 100 mL/hr over 30 Minutes Intravenous  Once 11/26/16 1401 11/26/16 1515   11/26/16 1200  azithromycin (ZITHROMAX) 500 mg in dextrose 5 % 250 mL IVPB     500 mg 250 mL/hr over 60 Minutes Intravenous Every 24 hours 11/25/16 1257     11/26/16 1200  cefTRIAXone (ROCEPHIN) 1 g in dextrose 5 % 50 mL IVPB  Status:  Discontinued     1 g 100 mL/hr over 30 Minutes Intravenous Every 24 hours 11/25/16 1257 11/26/16 1357   11/25/16 1530  oseltamivir (TAMIFLU) capsule 30 mg     30 mg Oral 2  times daily 11/25/16 1525 11/30/16 0959   11/25/16 1445  cefTRIAXone (ROCEPHIN) 1 g in dextrose 5 % 50 mL IVPB  Status:  Discontinued     1 g 100 mL/hr over 30 Minutes Intravenous  Once 11/25/16 1433 11/25/16 1435   11/25/16 1445  azithromycin (ZITHROMAX) 500 mg in dextrose 5 % 250 mL IVPB  Status:  Discontinued     500 mg 250 mL/hr over 60 Minutes Intravenous  Once 11/25/16 1433 11/25/16 1436   11/25/16 1215  cefTRIAXone (ROCEPHIN) 1 g in dextrose 5 % 50 mL IVPB     1 g 100 mL/hr over 30 Minutes Intravenous  Once 11/25/16 1206 11/25/16 1348   11/25/16 1215  azithromycin (ZITHROMAX) 500 mg in dextrose 5 % 250 mL IVPB     500 mg 250 mL/hr over 60 Minutes Intravenous  Once 11/25/16 1206 11/25/16 1416         ROS:  as in HPI otherwise remainder of 12 point Review of Systems is negative    Blood pressure (!) 156/77, pulse 80, temperature 98.2 F (36.8 C), temperature source Oral, resp. rate 19, height 5'  1" (1.549 m), weight 172 lb 6.4 oz (78.2 kg), SpO2 98 %. General: Alert and awake, oriented x3, not in any acute distress. HEENT: anicteric sclera,  EOMI, oropharynx clear and without exudate Cardiovascular: tachy rate, normal r,  no murmur rubs or gallops Pulmonary:  Rhonchi throughout Gastrointestinal: soft nontender, nondistended, normal bowel sounds, Musculoskeletal: no  clubbing or edema noted bilaterally Skin, soft tissue: no rashes Neuro: nonfocal, strength and sensation intact   Results for orders placed or performed during the hospital encounter of 11/25/16 (from the past 48 hour(s))  Blood Culture (routine x 2)     Status: None (Preliminary result)   Collection Time: 11/25/16 12:07 PM  Result Value Ref Range   Specimen Description BLOOD RIGHT ANTECUBITAL    Special Requests      BOTTLES DRAWN AEROBIC AND ANAEROBIC Blood Culture adequate volume   Culture  Setup Time      ANAEROBIC BOTTLE ONLY GRAM POSITIVE COCCI IN CLUSTERS Organism ID to follow CRITICAL RESULT CALLED TO, READ BACK BY AND VERIFIED WITH: E WILLIAMSON 11/26/16 @ 0707 M VESTAL Performed at [facility], [address], [place], [place] [address]    Culture GRAM POSITIVE COCCI    Report Status PENDING   Blood Culture ID Panel (Reflexed)     Status: Abnormal   Collection Time: 11/25/16 12:07 PM  Result Value Ref Range   Enterococcus species NOT DETECTED NOT DETECTED   Listeria monocytogenes NOT DETECTED NOT DETECTED   Staphylococcus species DETECTED (A) NOT DETECTED    Comment: Methicillin (oxacillin) susceptible coagulase negative staphylococcus. Possible blood culture contaminant (unless isolated from more than one blood culture draw or clinical case suggests pathogenicity). No antibiotic treatment is indicated for blood  culture contaminants. CRITICAL RESULT CALLED TO, READ BACK BY AND VERIFIED WITH: E WILLIAMSON 11/26/16 @ 4696 M VESTAL    Staphylococcus aureus NOT DETECTED NOT DETECTED   Methicillin  resistance NOT DETECTED NOT DETECTED   Streptococcus species NOT DETECTED NOT DETECTED   Streptococcus agalactiae NOT DETECTED NOT DETECTED   Streptococcus pneumoniae NOT DETECTED NOT DETECTED   Streptococcus pyogenes NOT DETECTED NOT DETECTED   Acinetobacter baumannii NOT DETECTED NOT DETECTED   Enterobacteriaceae species NOT DETECTED NOT DETECTED   Enterobacter cloacae complex NOT DETECTED NOT DETECTED   Escherichia coli NOT DETECTED NOT DETECTED   Klebsiella oxytoca NOT  DETECTED NOT DETECTED   Klebsiella pneumoniae NOT DETECTED NOT DETECTED   Proteus species NOT DETECTED NOT DETECTED   Serratia marcescens NOT DETECTED NOT DETECTED   Haemophilus influenzae NOT DETECTED NOT DETECTED   Neisseria meningitidis NOT DETECTED NOT DETECTED   Pseudomonas aeruginosa NOT DETECTED NOT DETECTED   Candida albicans NOT DETECTED NOT DETECTED   Candida glabrata NOT DETECTED NOT DETECTED   Candida krusei NOT DETECTED NOT DETECTED   Candida parapsilosis NOT DETECTED NOT DETECTED   Candida tropicalis NOT DETECTED NOT DETECTED    Comment: Performed at [facility] [address], [place], [place] [address]  Blood Culture (routine x 2)     Status: None (Preliminary result)   Collection Time: 11/25/16 12:12 PM  Result Value Ref Range   Specimen Description BLOOD RIGHT HAND    Special Requests      BOTTLES DRAWN AEROBIC AND ANAEROBIC Blood Culture adequate volume   Culture  Setup Time      GRAM POSITIVE COCCI IN CLUSTERS AEROBIC BOTTLE ONLY CRITICAL VALUE NOTED.  VALUE IS CONSISTENT WITH PREVIOUSLY REPORTED AND CALLED VALUE. Performed at [facility], [place] [address] [place], [place] [address]    Culture GRAM POSITIVE COCCI    Report Status PENDING   Comprehensive metabolic panel     Status: Abnormal   Collection Time: 11/25/16 12:25 PM  Result Value Ref Range   Sodium 137 135 - 145 mmol/L   Potassium 3.0 (L) 3.5 - 5.1 mmol/L   Chloride 98 (L) 101 - 111 mmol/L   CO2 25 22 - 32 mmol/L    Glucose, Bld 156 (H) 65 - 99 mg/dL   BUN 12 6 - 20 mg/dL   Creatinine, Ser 1.01 (H) 0.44 - 1.00 mg/dL   Calcium 8.3 (L) 8.9 - 10.3 mg/dL   Total Protein 7.2 6.5 - 8.1 g/dL   Albumin 3.1 (L) 3.5 - 5.0 g/dL   AST 23 15 - 41 U/L   ALT 19 14 - 54 U/L   Alkaline Phosphatase 33 (L) 38 - 126 U/L   Total Bilirubin 0.8 0.3 - 1.2 mg/dL   GFR calc non Af Amer 53 (L) >60 mL/min   GFR calc Af Amer >60 >60 mL/min    Comment: (NOTE) The eGFR has been calculated using the CKD EPI equation. This calculation has not been validated in all clinical situations. eGFR's persistently <60 mL/min signify possible Chronic Kidney Disease.    Anion gap 14 5 - 15  CBC WITH DIFFERENTIAL     Status: Abnormal   Collection Time: 11/25/16 12:25 PM  Result Value Ref Range   WBC 13.5 (H) 4.0 - 10.5 K/uL   RBC 5.19 (H) 3.87 - 5.11 MIL/uL   Hemoglobin 15.8 (H) 12.0 - 15.0 g/dL   HCT 48.2 (H) 36.0 - 46.0 %   MCV 92.9 78.0 - 100.0 fL   MCH 30.4 26.0 - 34.0 pg   MCHC 32.8 30.0 - 36.0 g/dL   RDW 13.7 11.5 - 15.5 %   Platelets 204 150 - 400 K/uL   Neutrophils Relative % 83 %   Lymphocytes Relative 8 %   Monocytes Relative 8 %   Eosinophils Relative 0 %   Basophils Relative 1 %   Neutro Abs 11.2 (H) 1.7 - 7.7 K/uL   Lymphs Abs 1.1 0.7 - 4.0 K/uL   Monocytes Absolute 1.1 (H) 0.1 - 1.0 K/uL   Eosinophils Absolute 0.0 0.0 - 0.7 K/uL   Basophils Absolute  0.1 0.0 - 0.1 K/uL   Smear Review MORPHOLOGY UNREMARKABLE   I-Stat CG4 Lactic Acid, ED  (not at  [facility])     Status: Abnormal   Collection Time: 11/25/16 12:32 PM  Result Value Ref Range   Lactic Acid, Venous 2.29 (HH) 0.5 - 1.9 mmol/L   Comment NOTIFIED PHYSICIAN   Influenza panel by PCR (type A & B)     Status: Abnormal   Collection Time: 11/25/16  1:08 PM  Result Value Ref Range   Influenza A By PCR NEGATIVE NEGATIVE   Influenza B By PCR POSITIVE (A) NEGATIVE    Comment: (NOTE) The Xpert Xpress Flu assay is intended as an aid in the diagnosis of  influenza  and should not be used as a sole basis for treatment.  This  assay is FDA approved for nasopharyngeal swab specimens only. Nasal  washings and aspirates are unacceptable for Xpert Xpress Flu testing.   MRSA PCR Screening     Status: None   Collection Time: 11/25/16  2:43 PM  Result Value Ref Range   MRSA by PCR NEGATIVE NEGATIVE    Comment:        The GeneXpert MRSA Assay (FDA approved for NASAL specimens only), is one component of a comprehensive MRSA colonization surveillance program. It is not intended to diagnose MRSA infection nor to guide or monitor treatment for MRSA infections.   Culture, blood (x 2)     Status: None (Preliminary result)   Collection Time: 11/25/16  2:56 PM  Result Value Ref Range   Specimen Description BLOOD LEFT ARM    Special Requests AEROBIC BOTTLE ONLY Blood Culture adequate volume    Culture      NO GROWTH < 24 HOURS Performed at [facility] [address] [place], [place] [address]    Report Status PENDING   Lactic acid, plasma     Status: Abnormal   Collection Time: 11/25/16  2:56 PM  Result Value Ref Range   Lactic Acid, Venous 2.6 (HH) 0.5 - 1.9 mmol/L    Comment: CRITICAL RESULT CALLED TO, READ BACK BY AND VERIFIED WITH: STEVENS,G. RN @1541  ON 05.01.18 BY COHEN,K   Procalcitonin     Status: None   Collection Time: 11/25/16  2:56 PM  Result Value Ref Range   Procalcitonin 1.11 ng/mL    Comment:        Interpretation: PCT > 0.5 ng/mL and <= 2 ng/mL: Systemic infection (sepsis) is possible, but other conditions are known to elevate PCT as well. (NOTE)         ICU PCT Algorithm               Non ICU PCT Algorithm    ----------------------------     ------------------------------         PCT < 0.25 ng/mL                 PCT < 0.1 ng/mL     Stopping of antibiotics            Stopping of antibiotics       strongly encouraged.               strongly encouraged.    ----------------------------     ------------------------------        PCT level decrease by               PCT < 0.25 ng/mL       >= 80% from peak  PCT       OR PCT 0.25 - 0.5 ng/mL          Stopping of antibiotics                                             encouraged.     Stopping of antibiotics           encouraged.    ----------------------------     ------------------------------       PCT level decrease by              PCT >= 0.25 ng/mL       < 80% from peak PCT        AND PCT >= 0.5 ng/mL             Continuing antibiotics                                              encouraged.       Continuing antibiotics            encouraged.    ----------------------------     ------------------------------     PCT level increase compared          PCT > 0.5 ng/mL         with peak PCT AND          PCT >= 0.5 ng/mL             Escalation of antibiotics                                          strongly encouraged.      Escalation of antibiotics        strongly encouraged.   HIV antibody     Status: None   Collection Time: 11/25/16  2:56 PM  Result Value Ref Range   HIV Screen 4th Generation wRfx Non Reactive Non Reactive    Comment: (NOTE) Performed At: [facility] [place], [place] [id number] Lindon Romp MD CV:[id number]   CBC     Status: Abnormal   Collection Time: 11/25/16  2:56 PM  Result Value Ref Range   WBC 14.3 (H) 4.0 - 10.5 K/uL   RBC 4.40 3.87 - 5.11 MIL/uL   Hemoglobin 13.0 12.0 - 15.0 g/dL   HCT 40.6 36.0 - 46.0 %   MCV 92.3 78.0 - 100.0 fL   MCH 29.5 26.0 - 34.0 pg   MCHC 32.0 30.0 - 36.0 g/dL   RDW 13.9 11.5 - 15.5 %   Platelets 185 150 - 400 K/uL  Creatinine, serum     Status: Abnormal   Collection Time: 11/25/16  2:56 PM  Result Value Ref Range   Creatinine, Ser 0.98 0.44 - 1.00 mg/dL   GFR calc non Af Amer 55 (L) >60 mL/min   GFR calc Af Amer >60 >60 mL/min    Comment: (NOTE) The eGFR has been calculated using the CKD EPI equation. This calculation has not been validated in all clinical  situations. eGFR's persistently <60 mL/min signify possible Chronic Kidney Disease.   TSH  Status: None   Collection Time: 11/25/16  2:56 PM  Result Value Ref Range   TSH 0.604 0.350 - 4.500 uIU/mL    Comment: Performed by a 3rd Generation assay with a functional sensitivity of <=0.01 uIU/mL.  Culture, blood (x 2)     Status: None (Preliminary result)   Collection Time: 11/25/16  2:58 PM  Result Value Ref Range   Specimen Description BLOOD RIGHT HAND    Special Requests      BOTTLES DRAWN AEROBIC AND ANAEROBIC Blood Culture adequate volume   Culture      NO GROWTH < 24 HOURS Performed at [facility], [place] [address], [place], [place] [address]    Report Status PENDING   Culture, sputum-assessment     Status: None   Collection Time: 11/25/16  3:32 PM  Result Value Ref Range   Specimen Description EXPECTORATED SPUTUM    Special Requests NONE    Sputum evaluation THIS SPECIMEN IS ACCEPTABLE FOR SPUTUM CULTURE    Report Status 11/25/2016 FINAL   Culture, respiratory (NON-Expectorated)     Status: None (Preliminary result)   Collection Time: 11/25/16  3:32 PM  Result Value Ref Range   Specimen Description EXPECTORATED SPUTUM    Special Requests NONE Reflexed from [id number]    Gram Stain      ABUNDANT WBC PRESENT, PREDOMINANTLY PMN ABUNDANT GRAM NEGATIVE RODS ABUNDANT GRAM POSITIVE COCCI IN PAIRS FEW GRAM POSITIVE RODS FEW YEAST MODERATE SQUAMOUS EPITHELIAL CELLS PRESENT    Culture      TOO YOUNG TO READ Performed at [facility], [place] [address] [place], [place] [address]    Report Status PENDING   Lactic acid, plasma     Status: Abnormal   Collection Time: 11/25/16  5:43 PM  Result Value Ref Range   Lactic Acid, Venous 3.5 (HH) 0.5 - 1.9 mmol/L    Comment: CRITICAL RESULT CALLED TO, READ BACK BY AND VERIFIED WITH: G.STEPHENS RN AT 1844 ON 11/25/16 BY S.VANHOORNE   Lactic acid, plasma     Status: None   Collection Time: 11/25/16  8:09 PM  Result Value Ref  Range   Lactic Acid, Venous 1.8 0.5 - 1.9 mmol/L  Urinalysis, Routine w reflex microscopic     Status: Abnormal   Collection Time: 11/25/16 10:53 PM  Result Value Ref Range   Color, Urine YELLOW YELLOW   APPearance CLEAR CLEAR   Specific Gravity, Urine 1.009 1.005 - 1.030   pH 6.0 5.0 - 8.0   Glucose, UA NEGATIVE NEGATIVE mg/dL   Hgb urine dipstick NEGATIVE NEGATIVE   Bilirubin Urine NEGATIVE NEGATIVE   Ketones, ur NEGATIVE NEGATIVE mg/dL   Protein, ur NEGATIVE NEGATIVE mg/dL   Nitrite NEGATIVE NEGATIVE   Leukocytes, UA SMALL (A) NEGATIVE   RBC / HPF 0-5 0 - 5 RBC/hpf   WBC, UA 6-30 0 - 5 WBC/hpf   Bacteria, UA RARE (A) NONE SEEN   Squamous Epithelial / LPF 0-5 (A) NONE SEEN  Strep pneumoniae urinary antigen     Status: Abnormal   Collection Time: 11/25/16 10:53 PM  Result Value Ref Range   Strep Pneumo Urinary Antigen POSITIVE (A) NEGATIVE    Comment: Performed at [facility], [place] [address], [place], [place] [address]  Lactic acid, plasma     Status: None   Collection Time: 11/25/16 10:59 PM  Result Value Ref Range   Lactic Acid, Venous 1.5 0.5 - 1.9 mmol/L  Basic metabolic panel  Status: Abnormal   Collection Time: 11/26/16  3:21 AM  Result Value Ref Range   Sodium 141 135 - 145 mmol/L   Potassium 3.7 3.5 - 5.1 mmol/L    Comment: DELTA CHECK NOTED REPEATED TO VERIFY NO VISIBLE HEMOLYSIS    Chloride 106 101 - 111 mmol/L   CO2 28 22 - 32 mmol/L   Glucose, Bld 97 65 - 99 mg/dL   BUN 12 6 - 20 mg/dL   Creatinine, Ser 0.81 0.44 - 1.00 mg/dL   Calcium 7.3 (L) 8.9 - 10.3 mg/dL   GFR calc non Af Amer >60 >60 mL/min   GFR calc Af Amer >60 >60 mL/min    Comment: (NOTE) The eGFR has been calculated using the CKD EPI equation. This calculation has not been validated in all clinical situations. eGFR's persistently <60 mL/min signify possible Chronic Kidney Disease.    Anion gap 7 5 - 15  CBC     Status: Abnormal   Collection Time: 11/26/16  3:21 AM  Result Value  Ref Range   WBC 12.7 (H) 4.0 - 10.5 K/uL   RBC 4.02 3.87 - 5.11 MIL/uL   Hemoglobin 11.8 (L) 12.0 - 15.0 g/dL   HCT 37.2 36.0 - 46.0 %   MCV 92.5 78.0 - 100.0 fL   MCH 29.4 26.0 - 34.0 pg   MCHC 31.7 30.0 - 36.0 g/dL   RDW 14.2 11.5 - 15.5 %   Platelets 189 150 - 400 K/uL  Albumin     Status: Abnormal   Collection Time: 11/26/16  3:21 AM  Result Value Ref Range   Albumin 2.2 (L) 3.5 - 5.0 g/dL  Glucose, capillary     Status: Abnormal   Collection Time: 11/26/16  7:38 AM  Result Value Ref Range   Glucose-Capillary 101 (H) 65 - 99 mg/dL   @BRIEFLABTABLE (sdes,specrequest,cult,reptstatus)   ) Recent Results (from the past 720 hour(s))  Blood Culture (routine x 2)     Status: None (Preliminary result)   Collection Time: 11/25/16 12:07 PM  Result Value Ref Range Status   Specimen Description BLOOD RIGHT ANTECUBITAL  Final   Special Requests   Final    BOTTLES DRAWN AEROBIC AND ANAEROBIC Blood Culture adequate volume   Culture  Setup Time   Final    ANAEROBIC BOTTLE ONLY GRAM POSITIVE COCCI IN CLUSTERS Organism ID to follow CRITICAL RESULT CALLED TO, READ BACK BY AND VERIFIED WITH: E WILLIAMSON 11/26/16 @ 8592 M VESTAL Performed at [facility], [address] [place], [place] [address]    Culture GRAM POSITIVE COCCI  Final   Report Status PENDING  Incomplete  Blood Culture ID Panel (Reflexed)     Status: Abnormal   Collection Time: 11/25/16 12:07 PM  Result Value Ref Range Status   Enterococcus species NOT DETECTED NOT DETECTED Final   Listeria monocytogenes NOT DETECTED NOT DETECTED Final   Staphylococcus species DETECTED (A) NOT DETECTED Final    Comment: Methicillin (oxacillin) susceptible coagulase negative staphylococcus. Possible blood culture contaminant (unless isolated from more than one blood culture draw or clinical case suggests pathogenicity). No antibiotic treatment is indicated for blood  culture contaminants. CRITICAL RESULT CALLED TO, READ BACK BY AND  VERIFIED WITH: E WILLIAMSON 11/26/16 @ 2863 M VESTAL    Staphylococcus aureus NOT DETECTED NOT DETECTED Final   Methicillin resistance NOT DETECTED NOT DETECTED Final   Streptococcus species NOT DETECTED NOT DETECTED Final   Streptococcus agalactiae NOT DETECTED NOT DETECTED Final  Streptococcus pneumoniae NOT DETECTED NOT DETECTED Final   Streptococcus pyogenes NOT DETECTED NOT DETECTED Final   Acinetobacter baumannii NOT DETECTED NOT DETECTED Final   Enterobacteriaceae species NOT DETECTED NOT DETECTED Final   Enterobacter cloacae complex NOT DETECTED NOT DETECTED Final   Escherichia coli NOT DETECTED NOT DETECTED Final   Klebsiella oxytoca NOT DETECTED NOT DETECTED Final   Klebsiella pneumoniae NOT DETECTED NOT DETECTED Final   Proteus species NOT DETECTED NOT DETECTED Final   Serratia marcescens NOT DETECTED NOT DETECTED Final   Haemophilus influenzae NOT DETECTED NOT DETECTED Final   Neisseria meningitidis NOT DETECTED NOT DETECTED Final   Pseudomonas aeruginosa NOT DETECTED NOT DETECTED Final   Candida albicans NOT DETECTED NOT DETECTED Final   Candida glabrata NOT DETECTED NOT DETECTED Final   Candida krusei NOT DETECTED NOT DETECTED Final   Candida parapsilosis NOT DETECTED NOT DETECTED Final   Candida tropicalis NOT DETECTED NOT DETECTED Final    Comment: Performed at [facility], [place] [address] [place], [place] [address]  Blood Culture (routine x 2)     Status: None (Preliminary result)   Collection Time: 11/25/16 12:12 PM  Result Value Ref Range Status   Specimen Description BLOOD RIGHT HAND  Final   Special Requests   Final    BOTTLES DRAWN AEROBIC AND ANAEROBIC Blood Culture adequate volume   Culture  Setup Time   Final    GRAM POSITIVE COCCI IN CLUSTERS AEROBIC BOTTLE ONLY CRITICAL VALUE NOTED.  VALUE IS CONSISTENT WITH PREVIOUSLY REPORTED AND CALLED VALUE. Performed at [facility], [place] [address] [place], [place] [address]    Culture GRAM POSITIVE  COCCI  Final   Report Status PENDING  Incomplete  MRSA PCR Screening     Status: None   Collection Time: 11/25/16  2:43 PM  Result Value Ref Range Status   MRSA by PCR NEGATIVE NEGATIVE Final    Comment:        The GeneXpert MRSA Assay (FDA approved for NASAL specimens only), is one component of a comprehensive MRSA colonization surveillance program. It is not intended to diagnose MRSA infection nor to guide or monitor treatment for MRSA infections.   Culture, blood (x 2)     Status: None (Preliminary result)   Collection Time: 11/25/16  2:56 PM  Result Value Ref Range Status   Specimen Description BLOOD LEFT ARM  Final   Special Requests AEROBIC BOTTLE ONLY Blood Culture adequate volume  Final   Culture   Final    NO GROWTH < 24 HOURS Performed at [facility], [place] [address], [place], [place] [address]    Report Status PENDING  Incomplete  Culture, blood (x 2)     Status: None (Preliminary result)   Collection Time: 11/25/16  2:58 PM  Result Value Ref Range Status   Specimen Description BLOOD RIGHT HAND  Final   Special Requests   Final    BOTTLES DRAWN AEROBIC AND ANAEROBIC Blood Culture adequate volume   Culture   Final    NO GROWTH < 24 HOURS Performed at [facility], [place] [address], [place], [place] [address]    Report Status PENDING  Incomplete  Culture, sputum-assessment     Status: None   Collection Time: 11/25/16  3:32 PM  Result Value Ref Range Status   Specimen Description EXPECTORATED SPUTUM  Final   Special Requests NONE  Final   Sputum evaluation THIS SPECIMEN IS ACCEPTABLE FOR SPUTUM CULTURE  Final   Report Status 11/25/2016 FINAL  Final  Culture, respiratory (NON-Expectorated)     Status: None (Preliminary result)   Collection Time: 11/25/16  3:32 PM  Result Value Ref Range Status   Specimen Description EXPECTORATED SPUTUM  Final   Special Requests NONE Reflexed from [id number]  Final   Gram Stain   Final    ABUNDANT WBC PRESENT,  PREDOMINANTLY PMN ABUNDANT GRAM NEGATIVE RODS ABUNDANT GRAM POSITIVE COCCI IN PAIRS FEW GRAM POSITIVE RODS FEW YEAST MODERATE SQUAMOUS EPITHELIAL CELLS PRESENT    Culture   Final    TOO YOUNG TO READ Performed at [facility], [place] [address], [place], [place] [address]    Report Status PENDING  Incomplete     Impression/Recommendation  Active Problems:   Pneumonia   Annalynn Centanni is a 76 y.o. female with  Hx of COPD admitted with CAP after URI found to have influenza B, Coagulase negative Staphylococcal bacteremia and pneumococcal antigen + in urine  #1 Coagulase negative staphylococcal bacteremia: Similar to MSSA, MRSA I would expect Coag Neg staph to be capable of causing invasive infection AFTER infection and damage to resp epitelium from influenza though I have anxiety about potential endocarditis  --continue ceftriaxone --obtaine TTE --consider TEE  #2 Pneumoccal ag +: strange to have mixed infection but possible. She will be covered with pneumococcus  #3 Influenza: give her 5 days of tamiflu   11/26/2016, 7:22 PM   Thank you so much for this interesting consult  [facility] [place] [place] [phone] (pager) [phone] (office) 11/26/2016, 7:22 PM  Rhina [place] 11/26/2016, 7:22 PM

## 2016-11-26 NOTE — Progress Notes (Signed)
PHARMACY - PHYSICIAN COMMUNICATION CRITICAL VALUE ALERT - BLOOD CULTURE IDENTIFICATION (BCID)  Results for orders placed or performed during the hospital encounter of 11/25/16  Blood Culture ID Panel (Reflexed) (Collected: 11/25/2016 12:07 PM)  Result Value Ref Range   Enterococcus species NOT DETECTED NOT DETECTED   Listeria monocytogenes NOT DETECTED NOT DETECTED   Staphylococcus species DETECTED (A) NOT DETECTED   Staphylococcus aureus NOT DETECTED NOT DETECTED   Methicillin resistance NOT DETECTED NOT DETECTED   Streptococcus species NOT DETECTED NOT DETECTED   Streptococcus agalactiae NOT DETECTED NOT DETECTED   Streptococcus pneumoniae NOT DETECTED NOT DETECTED   Streptococcus pyogenes NOT DETECTED NOT DETECTED   Acinetobacter baumannii NOT DETECTED NOT DETECTED   Enterobacteriaceae species NOT DETECTED NOT DETECTED   Enterobacter cloacae complex NOT DETECTED NOT DETECTED   Escherichia coli NOT DETECTED NOT DETECTED   Klebsiella oxytoca NOT DETECTED NOT DETECTED   Klebsiella pneumoniae NOT DETECTED NOT DETECTED   Proteus species NOT DETECTED NOT DETECTED   Serratia marcescens NOT DETECTED NOT DETECTED   Haemophilus influenzae NOT DETECTED NOT DETECTED   Neisseria meningitidis NOT DETECTED NOT DETECTED   Pseudomonas aeruginosa NOT DETECTED NOT DETECTED   Candida albicans NOT DETECTED NOT DETECTED   Candida glabrata NOT DETECTED NOT DETECTED   Candida krusei NOT DETECTED NOT DETECTED   Candida parapsilosis NOT DETECTED NOT DETECTED   Candida tropicalis NOT DETECTED NOT DETECTED    Name of physician (or Provider) Contacted: Dr. Patrecia Pour  Changes to prescribed antibiotics required: ID already consulted by [facility] MD  Lindell Spar M 11/26/2016  2:15 PM

## 2016-11-26 NOTE — Care Management Note (Signed)
Case Management Note  Patient Details  Name: Brigida Scotti MRN: [id number] Date of Birth: 04/05/1941  Subjective/Objective:      Sepsis with positive bld. cultures              Action/Plan:from home Will follow for dc needs   Expected Discharge Date:   (unknown) [id number]              Expected Discharge Plan:   home  In-House Referral:     Discharge planning Services     Post Acute Care Choice:    Choice offered to:     DME Arranged:    DME Agency:     HH Arranged:    HH Agency:     Status of Service:  In process, will continue to follow  If discussed at Long Length of Stay Meetings, dates discussed:    Additional Comments:  Leeroy Cha, RN 11/26/2016, 10:14 AM

## 2016-11-26 NOTE — Progress Notes (Signed)
PHARMACY - PHYSICIAN COMMUNICATION CRITICAL VALUE ALERT - BLOOD CULTURE IDENTIFICATION (BCID)  Results for orders placed or performed during the hospital encounter of 11/25/16  Blood Culture ID Panel (Reflexed) (Collected: 11/25/2016 12:07 PM)  Result Value Ref Range   Enterococcus species NOT DETECTED NOT DETECTED   Listeria monocytogenes NOT DETECTED NOT DETECTED   Staphylococcus species DETECTED (A) NOT DETECTED   Staphylococcus aureus NOT DETECTED NOT DETECTED   Methicillin resistance NOT DETECTED NOT DETECTED   Streptococcus species NOT DETECTED NOT DETECTED   Streptococcus agalactiae NOT DETECTED NOT DETECTED   Streptococcus pneumoniae NOT DETECTED NOT DETECTED   Streptococcus pyogenes NOT DETECTED NOT DETECTED   Acinetobacter baumannii NOT DETECTED NOT DETECTED   Enterobacteriaceae species NOT DETECTED NOT DETECTED   Enterobacter cloacae complex NOT DETECTED NOT DETECTED   Escherichia coli NOT DETECTED NOT DETECTED   Klebsiella oxytoca NOT DETECTED NOT DETECTED   Klebsiella pneumoniae NOT DETECTED NOT DETECTED   Proteus species NOT DETECTED NOT DETECTED   Serratia marcescens NOT DETECTED NOT DETECTED   Haemophilus influenzae NOT DETECTED NOT DETECTED   Neisseria meningitidis NOT DETECTED NOT DETECTED   Pseudomonas aeruginosa NOT DETECTED NOT DETECTED   Candida albicans NOT DETECTED NOT DETECTED   Candida glabrata NOT DETECTED NOT DETECTED   Candida krusei NOT DETECTED NOT DETECTED   Candida parapsilosis NOT DETECTED NOT DETECTED   Candida tropicalis NOT DETECTED NOT DETECTED    Name of physician (or Provider) ContactedQuincy Simmonds  Changes to prescribed antibiotics required: no changes needed Could DC azithromycin  Eudelia Bunch, Pharm.D. [phone] 11/26/2016 2:07 PM

## 2016-11-26 NOTE — Progress Notes (Signed)
PROGRESS NOTE [facility]   [place] Steller   QMV:[id number] DOB: Dec 13, 1940  DOA: 11/25/2016 PCP: Wilfred Lacy, NP   Brief Narrative:  Courtney Brown is a 76 y.o. female with past medical history of COPD, hypertension, MI in 2008, and hyperlipidemia presented to the emergency department complaining of shortness of breath, weak and tired associated with productive cough with fevers and chills the past 2 weeks. Patient was admitted with a chest x-ray coarsening of pneumonia in the right side, sepsis and positive influenza. Patient was started on empiric antibiotic, tamiflu and IVF.   Subjective: Patient seen and examined report improvement in her breathing. Although continue to have significant wheezing. She also reported that her cough has increased since yesterday.. Patient denies chest pain, palpitations and dizziness. Patient afebrile and tolerating diet well.  Assessment & Plan: Acute respiratory failure due to Sepsis secondary to influenza pneumonia with superimposed strep pneumo.  Sepsis physiology resolved  Chest x-ray on admission showed right midlung and left base consistent with pneumonia. Influenza B+, Strep pneumoniae +  Patient on empiric antibiotics ceftriaxone and azithromycin and Tamiflu Check pro-calcitonin in AM for antibiotic guidance First set 11/25/16 of blood culture 2 bottles positive for staph species Second set of blood cultures done the same 11/25/16 day but 4 hour later shows NGTD We'll follow sensitivities recommendations.  Sputum culture pending Continue supportive treatment, will add Hycodan, Brovana and Pulmicort Wean oxygen as tolerated to keep sats > 89%  ? Positive blood cultures First set 11/25/16 of blood culture 2 bottles positive for staph species Second set of blood cultures done the same 11/25/16 day but 4 hour later shows NGTD Maybe contaminant  ID consulted   AKI  - 2/2  Initial Cr 1.01 - improved with IVF  Check BMP in AM   Hypokalemia    Resolved  Check BMP   DVT prophylaxis: Lovenox  Code Status: FULL  Family Communication: None at bedside  Disposition Plan: Transfer to medical floor, possible d/c in 24-48 hrs   Consultants:   ID   Procedures:   None   Antimicrobials: Anti-infectives    Start     Dose/Rate Route Frequency Ordered Stop   11/26/16 1200  azithromycin (ZITHROMAX) 500 mg in dextrose 5 % 250 mL IVPB     500 mg 250 mL/hr over 60 Minutes Intravenous Every 24 hours 11/25/16 1257     11/26/16 1200  cefTRIAXone (ROCEPHIN) 1 g in dextrose 5 % 50 mL IVPB     1 g 100 mL/hr over 30 Minutes Intravenous Every 24 hours 11/25/16 1257     11/25/16 1530  oseltamivir (TAMIFLU) capsule 30 mg     30 mg Oral 2 times daily 11/25/16 1525 11/30/16 0959   11/25/16 1445  cefTRIAXone (ROCEPHIN) 1 g in dextrose 5 % 50 mL IVPB  Status:  Discontinued     1 g 100 mL/hr over 30 Minutes Intravenous  Once 11/25/16 1433 11/25/16 1435   11/25/16 1445  azithromycin (ZITHROMAX) 500 mg in dextrose 5 % 250 mL IVPB  Status:  Discontinued     500 mg 250 mL/hr over 60 Minutes Intravenous  Once 11/25/16 1433 11/25/16 1436   11/25/16 1215  cefTRIAXone (ROCEPHIN) 1 g in dextrose 5 % 50 mL IVPB     1 g 100 mL/hr over 30 Minutes Intravenous  Once 11/25/16 1206 11/25/16 1348   11/25/16 1215  azithromycin (ZITHROMAX) 500 mg in dextrose 5 % 250 mL IVPB     500 mg 250 mL/hr  over 60 Minutes Intravenous  Once 11/25/16 1206 11/25/16 1416        Objective: Vitals:   11/26/16 0500 11/26/16 0600 11/26/16 0700 11/26/16 0800  BP: (!) 122/48 (!) 139/50 (!) 152/53 (!) 129/42  Pulse: 71 78 79 82  Resp: 16 20 16  (!) 27  Temp:      TempSrc:      SpO2: 94% 91% 91% 97%  Weight:      Height:        Intake/Output Summary (Last 24 hours) at 11/26/16 0838 Last data filed at 11/26/16 0700  Gross per 24 hour  Intake             1810 ml  Output                0 ml  Net             1810 ml   Filed Weights   11/25/16 1144 11/25/16 1500   Weight: 77.1 kg (170 lb) 78.2 kg (172 lb 6.4 oz)    Examination:  General exam: Appears calm and comfortable  HEENT: OP moist and clear Respiratory system: Good air entry, diffuse wheezing and rales R>L  Cardiovascular system: S1 & S2 heard, RRR. No JVD, murmurs, rubs or gallops Gastrointestinal system: Abd is nondistended, soft and nontender. Normal bowel sounds heard. Central nervous system: Alert and oriented. No focal neurological deficits. Extremities: No pedal edema. Symmetric,  Skin: No rashes, lesions or ulcers Psychiatry: Judgement and insight appear normal. Mood & affect appropriate.    Data Reviewed: I have personally reviewed following labs and imaging studies  CBC:  Recent Labs Lab 11/25/16 1225 11/25/16 1456 11/26/16 0321  WBC 13.5* 14.3* 12.7*  NEUTROABS 11.2*  --   --   HGB 15.8* 13.0 11.8*  HCT 48.2* 40.6 37.2  MCV 92.9 92.3 92.5  PLT 204 185 258   Basic Metabolic Panel:  Recent Labs Lab 11/25/16 1225 11/25/16 1456 11/26/16 0321  NA 137  --  141  K 3.0*  --  3.7  CL 98*  --  106  CO2 25  --  28  GLUCOSE 156*  --  97  BUN 12  --  12  CREATININE 1.01* 0.98 0.81  CALCIUM 8.3*  --  7.3*   GFR: Estimated Creatinine Clearance: 56.8 mL/min (by C-G formula based on SCr of 0.81 mg/dL). Liver Function Tests:  Recent Labs Lab 11/25/16 1225  AST 23  ALT 19  ALKPHOS 33*  BILITOT 0.8  PROT 7.2  ALBUMIN 3.1*   CBG:  Recent Labs Lab 11/26/16 0738  GLUCAP 101*   Lipid Profile: No results for input(s): CHOL, HDL, LDLCALC, TRIG, CHOLHDL, LDLDIRECT in the last 72 hours. Thyroid Function Tests:  Recent Labs  11/25/16 1456  TSH 0.604   Anemia Panel: No results for input(s): VITAMINB12, FOLATE, FERRITIN, TIBC, IRON, RETICCTPCT in the last 72 hours. Sepsis Labs:  Recent Labs Lab 11/25/16 1456 11/25/16 1743 11/25/16 2009 11/25/16 2259  PROCALCITON 1.11  --   --   --   LATICACIDVEN 2.6* 3.5* 1.8 1.5    Recent Results (from the past  240 hour(s))  Blood Culture (routine x 2)     Status: None (Preliminary result)   Collection Time: 11/25/16 12:07 PM  Result Value Ref Range Status   Specimen Description BLOOD RIGHT ANTECUBITAL  Final   Special Requests   Final    BOTTLES DRAWN AEROBIC AND ANAEROBIC Blood Culture adequate volume   Culture  Setup Time   Final    ANAEROBIC BOTTLE ONLY GRAM POSITIVE COCCI IN CLUSTERS Organism ID to follow CRITICAL RESULT CALLED TO, READ BACK BY AND VERIFIED WITH: E WILLIAMSON 11/26/16 @ 6962 M VESTAL Performed at [facility], [place] [address], [place], [place] [address]    Culture PENDING  Incomplete   Report Status PENDING  Incomplete  Blood Culture ID Panel (Reflexed)     Status: Abnormal   Collection Time: 11/25/16 12:07 PM  Result Value Ref Range Status   Enterococcus species NOT DETECTED NOT DETECTED Final   Listeria monocytogenes NOT DETECTED NOT DETECTED Final   Staphylococcus species DETECTED (A) NOT DETECTED Final    Comment: Methicillin (oxacillin) susceptible coagulase negative staphylococcus. Possible blood culture contaminant (unless isolated from more than one blood culture draw or clinical case suggests pathogenicity). No antibiotic treatment is indicated for blood  culture contaminants. CRITICAL RESULT CALLED TO, READ BACK BY AND VERIFIED WITH: E WILLIAMSON 11/26/16 @ 1324 M VESTAL    Staphylococcus aureus NOT DETECTED NOT DETECTED Final   Methicillin resistance NOT DETECTED NOT DETECTED Final   Streptococcus species NOT DETECTED NOT DETECTED Final   Streptococcus agalactiae NOT DETECTED NOT DETECTED Final   Streptococcus pneumoniae NOT DETECTED NOT DETECTED Final   Streptococcus pyogenes NOT DETECTED NOT DETECTED Final   Acinetobacter baumannii NOT DETECTED NOT DETECTED Final   Enterobacteriaceae species NOT DETECTED NOT DETECTED Final   Enterobacter cloacae complex NOT DETECTED NOT DETECTED Final   Escherichia coli NOT DETECTED NOT DETECTED Final   Klebsiella  oxytoca NOT DETECTED NOT DETECTED Final   Klebsiella pneumoniae NOT DETECTED NOT DETECTED Final   Proteus species NOT DETECTED NOT DETECTED Final   Serratia marcescens NOT DETECTED NOT DETECTED Final   Haemophilus influenzae NOT DETECTED NOT DETECTED Final   Neisseria meningitidis NOT DETECTED NOT DETECTED Final   Pseudomonas aeruginosa NOT DETECTED NOT DETECTED Final   Candida albicans NOT DETECTED NOT DETECTED Final   Candida glabrata NOT DETECTED NOT DETECTED Final   Candida krusei NOT DETECTED NOT DETECTED Final   Candida parapsilosis NOT DETECTED NOT DETECTED Final   Candida tropicalis NOT DETECTED NOT DETECTED Final    Comment: Performed at [facility], [place] [address], [place], [place] [address]  Blood Culture (routine x 2)     Status: None (Preliminary result)   Collection Time: 11/25/16 12:12 PM  Result Value Ref Range Status   Specimen Description BLOOD RIGHT HAND  Final   Special Requests   Final    BOTTLES DRAWN AEROBIC AND ANAEROBIC Blood Culture adequate volume   Culture  Setup Time   Final    GRAM POSITIVE COCCI IN CLUSTERS AEROBIC BOTTLE ONLY CRITICAL VALUE NOTED.  VALUE IS CONSISTENT WITH PREVIOUSLY REPORTED AND CALLED VALUE. Performed at [facility], [place] [address] [place], [place] [address]    Culture GRAM POSITIVE COCCI  Final   Report Status PENDING  Incomplete  MRSA PCR Screening     Status: None   Collection Time: 11/25/16  2:43 PM  Result Value Ref Range Status   MRSA by PCR NEGATIVE NEGATIVE Final    Comment:        The GeneXpert MRSA Assay (FDA approved for NASAL specimens only), is one component of a comprehensive MRSA colonization surveillance program. It is not intended to diagnose MRSA infection nor to guide or monitor treatment for MRSA infections.   Culture, sputum-assessment     Status: None   Collection Time:  11/25/16  3:32 PM  Result Value Ref Range Status   Specimen Description EXPECTORATED SPUTUM  Final   Special  Requests NONE  Final   Sputum evaluation THIS SPECIMEN IS ACCEPTABLE FOR SPUTUM CULTURE  Final   Report Status 11/25/2016 FINAL  Final  Culture, respiratory (NON-Expectorated)     Status: None (Preliminary result)   Collection Time: 11/25/16  3:32 PM  Result Value Ref Range Status   Specimen Description EXPECTORATED SPUTUM  Final   Special Requests NONE Reflexed from [id number]  Final   Gram Stain   Final    ABUNDANT WBC PRESENT, PREDOMINANTLY PMN ABUNDANT GRAM NEGATIVE RODS ABUNDANT GRAM POSITIVE COCCI IN PAIRS FEW GRAM POSITIVE RODS FEW YEAST MODERATE SQUAMOUS EPITHELIAL CELLS PRESENT Performed at [facility], [place] [address] [place], [place] [address]    Culture PENDING  Incomplete   Report Status PENDING  Incomplete      Radiology Studies: Dg Chest Port 1 View  Result Date: 11/25/2016 CLINICAL DATA:  Cough with shortness of breath EXAM: PORTABLE CHEST 1 VIEW COMPARISON:  August 08, 2016 FINDINGS: There is focal airspace consolidation in the periphery of the right mid lung. There is also subtle infiltrate in the left base. Lungs elsewhere clear. Heart is mildly enlarged with pulmonary vascularity within normal limits. No adenopathy. There is aortic atherosclerosis. No bone lesions. IMPRESSION: Airspace consolidation in the right mid lung and to a lesser extent in the left base consistent with pneumonia. Lungs elsewhere clear. Stable cardiac prominence. There is aortic atherosclerosis. Followup PA and lateral chest radiographs recommended in 3-4 weeks following trial of antibiotic therapy to ensure resolution and exclude underlying malignancy. Electronically Signed   By: Lowella Grip III M.D.   On: 11/25/2016 12:43    Scheduled Meds: . aspirin  81 mg Oral Daily  . chlorhexidine  15 mL Mouth Rinse BID  . colesevelam  625 mg Oral BID WC  . enoxaparin (LOVENOX) injection  40 mg Subcutaneous Q24H  . fenofibrate  160 mg Oral Daily  . gabapentin  100 mg Oral TID  . loratadine   10 mg Oral Daily  . mouth rinse  15 mL Mouth Rinse q12n4p  . oseltamivir  30 mg Oral BID  . pantoprazole  40 mg Oral Daily  . pravastatin  40 mg Oral QHS  . raloxifene  60 mg Oral Daily  . sodium chloride flush  3 mL Intravenous Q12H  . tiotropium  18 mcg Inhalation Daily   Continuous Infusions: . sodium chloride 75 mL/hr at 11/25/16 1852  . azithromycin    . cefTRIAXone (ROCEPHIN)  IV       LOS: 1 day    Chipper Oman, MD Pager: Text Page via [email or web address]  [phone]  If 7PM-7AM, please contact night-coverage [email or web address] Password [facility] 11/26/2016, 8:38 AM

## 2016-11-26 NOTE — Progress Notes (Signed)
Received from SDU A&Ox4  O2 at 2 L [place] voices no C/O.SOB at rest

## 2016-11-27 ENCOUNTER — Inpatient Hospital Stay (HOSPITAL_COMMUNITY): Payer: Medicare Other

## 2016-11-27 DIAGNOSIS — I10 Essential (primary) hypertension: Secondary | ICD-10-CM

## 2016-11-27 DIAGNOSIS — R7881 Bacteremia: Secondary | ICD-10-CM

## 2016-11-27 DIAGNOSIS — A403 Sepsis due to Streptococcus pneumoniae: Secondary | ICD-10-CM

## 2016-11-27 LAB — CBC WITH DIFFERENTIAL/PLATELET
BASOS ABS: 0 10*3/uL (ref 0.0–0.1)
BASOS PCT: 0 %
EOS ABS: 0.2 10*3/uL (ref 0.0–0.7)
EOS PCT: 2 %
HCT: 38 % (ref 36.0–46.0)
Hemoglobin: 12.3 g/dL (ref 12.0–15.0)
LYMPHS ABS: 1.6 10*3/uL (ref 0.7–4.0)
Lymphocytes Relative: 17 %
MCH: 29.7 pg (ref 26.0–34.0)
MCHC: 32.4 g/dL (ref 30.0–36.0)
MCV: 91.8 fL (ref 78.0–100.0)
Monocytes Absolute: 1.2 10*3/uL — ABNORMAL HIGH (ref 0.1–1.0)
Monocytes Relative: 12 %
Neutro Abs: 6.6 10*3/uL (ref 1.7–7.7)
Neutrophils Relative %: 69 %
PLATELETS: 261 10*3/uL (ref 150–400)
RBC: 4.14 MIL/uL (ref 3.87–5.11)
RDW: 14.2 % (ref 11.5–15.5)
WBC: 9.6 10*3/uL (ref 4.0–10.5)

## 2016-11-27 LAB — BASIC METABOLIC PANEL
Anion gap: 9 (ref 5–15)
BUN: 11 mg/dL (ref 6–20)
CO2: 28 mmol/L (ref 22–32)
CREATININE: 0.74 mg/dL (ref 0.44–1.00)
Calcium: 8 mg/dL — ABNORMAL LOW (ref 8.9–10.3)
Chloride: 101 mmol/L (ref 101–111)
Glucose, Bld: 99 mg/dL (ref 65–99)
POTASSIUM: 3.2 mmol/L — AB (ref 3.5–5.1)
SODIUM: 138 mmol/L (ref 135–145)

## 2016-11-27 LAB — PROCALCITONIN: Procalcitonin: 0.89 ng/mL

## 2016-11-27 LAB — ECHOCARDIOGRAM COMPLETE
HEIGHTINCHES: 61 in
WEIGHTICAEL: 2692.8 [oz_av]

## 2016-11-27 LAB — GLUCOSE, CAPILLARY: GLUCOSE-CAPILLARY: 103 mg/dL — AB (ref 65–99)

## 2016-11-27 MED ORDER — AZITHROMYCIN 250 MG PO TABS
500.0000 mg | ORAL_TABLET | Freq: Once | ORAL | Status: AC
  Administered 2016-11-27: 500 mg via ORAL
  Filled 2016-11-27: qty 2

## 2016-11-27 MED ORDER — HYDRALAZINE HCL 20 MG/ML IJ SOLN
5.0000 mg | Freq: Three times a day (TID) | INTRAMUSCULAR | Status: DC | PRN
  Administered 2016-11-27: 5 mg via INTRAVENOUS
  Filled 2016-11-27: qty 1

## 2016-11-27 MED ORDER — METOPROLOL SUCCINATE ER 50 MG PO TB24
50.0000 mg | ORAL_TABLET | Freq: Two times a day (BID) | ORAL | Status: DC
  Administered 2016-11-27 – 2016-11-29 (×5): 50 mg via ORAL
  Filled 2016-11-27 (×5): qty 1

## 2016-11-27 MED ORDER — AMLODIPINE BESYLATE 5 MG PO TABS
5.0000 mg | ORAL_TABLET | Freq: Every day | ORAL | Status: DC
  Administered 2016-11-27: 5 mg via ORAL
  Filled 2016-11-27: qty 1

## 2016-11-27 MED ORDER — METHYLPREDNISOLONE SODIUM SUCC 125 MG IJ SOLR
125.0000 mg | Freq: Once | INTRAMUSCULAR | Status: AC
  Administered 2016-11-27: 125 mg via INTRAVENOUS
  Filled 2016-11-27: qty 2

## 2016-11-27 MED ORDER — POTASSIUM CHLORIDE CRYS ER 20 MEQ PO TBCR
40.0000 meq | EXTENDED_RELEASE_TABLET | ORAL | Status: AC
  Administered 2016-11-27 (×2): 40 meq via ORAL
  Filled 2016-11-27 (×2): qty 2

## 2016-11-27 NOTE — Progress Notes (Signed)
Subjective: No new complaints   Antibiotics:  Anti-infectives    Start     Dose/Rate Route Frequency Ordered Stop   11/27/16 1200  cefTRIAXone (ROCEPHIN) 2 g in dextrose 5 % 50 mL IVPB     2 g 100 mL/hr over 30 Minutes Intravenous Every 24 hours 11/26/16 1357     11/27/16 1200  azithromycin (ZITHROMAX) tablet 500 mg     500 mg Oral  Once 11/27/16 1033 11/27/16 1241   11/26/16 1415  cefTRIAXone (ROCEPHIN) 1 g in dextrose 5 % 50 mL IVPB     1 g 100 mL/hr over 30 Minutes Intravenous  Once 11/26/16 1401 11/26/16 1515   11/26/16 1200  azithromycin (ZITHROMAX) 500 mg in dextrose 5 % 250 mL IVPB  Status:  Discontinued     500 mg 250 mL/hr over 60 Minutes Intravenous Every 24 hours 11/25/16 1257 11/27/16 1033   11/26/16 1200  cefTRIAXone (ROCEPHIN) 1 g in dextrose 5 % 50 mL IVPB  Status:  Discontinued     1 g 100 mL/hr over 30 Minutes Intravenous Every 24 hours 11/25/16 1257 11/26/16 1357   11/25/16 1530  oseltamivir (TAMIFLU) capsule 30 mg     30 mg Oral 2 times daily 11/25/16 1525 11/30/16 0959   11/25/16 1445  cefTRIAXone (ROCEPHIN) 1 g in dextrose 5 % 50 mL IVPB  Status:  Discontinued     1 g 100 mL/hr over 30 Minutes Intravenous  Once 11/25/16 1433 11/25/16 1435   11/25/16 1445  azithromycin (ZITHROMAX) 500 mg in dextrose 5 % 250 mL IVPB  Status:  Discontinued     500 mg 250 mL/hr over 60 Minutes Intravenous  Once 11/25/16 1433 11/25/16 1436   11/25/16 1215  cefTRIAXone (ROCEPHIN) 1 g in dextrose 5 % 50 mL IVPB     1 g 100 mL/hr over 30 Minutes Intravenous  Once 11/25/16 1206 11/25/16 1348   11/25/16 1215  azithromycin (ZITHROMAX) 500 mg in dextrose 5 % 250 mL IVPB     500 mg 250 mL/hr over 60 Minutes Intravenous  Once 11/25/16 1206 11/25/16 1416      Medications: Scheduled Meds: . amLODipine  5 mg Oral QHS  . arformoterol  15 mcg Nebulization BID  . aspirin  81 mg Oral Daily  . budesonide (PULMICORT) nebulizer solution  0.5 mg Nebulization BID  . chlorhexidine   15 mL Mouth Rinse BID  . colesevelam  625 mg Oral BID WC  . enoxaparin (LOVENOX) injection  40 mg Subcutaneous Q24H  . fenofibrate  160 mg Oral Daily  . gabapentin  100 mg Oral TID  . loratadine  10 mg Oral Daily  . mouth rinse  15 mL Mouth Rinse q12n4p  . metoprolol succinate  50 mg Oral BID  . oseltamivir  30 mg Oral BID  . pantoprazole  40 mg Oral Daily  . pravastatin  40 mg Oral QHS  . raloxifene  60 mg Oral Daily  . sodium chloride flush  3 mL Intravenous Q12H  . tiotropium  18 mcg Inhalation Daily   Continuous Infusions: . cefTRIAXone (ROCEPHIN)  IV Stopped (11/27/16 1225)   PRN Meds:.acetaminophen, albuterol, ALPRAZolam, HYDROcodone-homatropine, ondansetron **OR** ondansetron (ZOFRAN) IV    Objective: Weight change: -1 lb 11.2 oz (-0.771 kg)  Intake/Output Summary (Last 24 hours) at 11/27/16 1720 Last data filed at 11/26/16 2200  Gross per 24 hour  Intake  120 ml  Output                0 ml  Net              120 ml   Blood pressure (!) 181/69, pulse 95, temperature 98 F (36.7 C), temperature source Oral, resp. rate 18, height 5\' 1"  (1.549 m), weight 168 lb 4.8 oz (76.3 kg), SpO2 95 %. Temp:  [97.8 F (36.6 C)-98.1 F (36.7 C)] 98 F (36.7 C) (05/03 1536) Pulse Rate:  [89-95] 95 (05/03 1536) Resp:  [18-20] 18 (05/03 1536) BP: (156-181)/(56-69) 181/69 (05/03 1536) SpO2:  [90 %-98 %] 95 % (05/03 1536) Weight:  [168 lb 4.8 oz (76.3 kg)] 168 lb 4.8 oz (76.3 kg) (05/03 0523)  Physical Exam: General: Alert and awake, oriented x3, not in any acute distress. HEENT: anicteric sclera, pupils reactive to light and accommodation, EOMI CVS regular rate, normal r,  no murmur rubs or gallops Chest: clear to auscultation bilaterally, no wheezing, rales or rhonchi Abdomen: soft nontender, nondistended, normal bowel sounds, Extremities: no  clubbing or edema noted bilaterally Skin: no rashes Lymph: no new lymphadenopathy Neuro: nonfocal  CBC:  General: Alert  and awake, oriented x3, not in any acute distress. HEENT: anicteric sclera,  EOMI, oropharynx clear and without exudate Cardiovascular: tachy rate, normal r,  no murmur rubs or gallops Pulmonary:  Rhonchi throughout Gastrointestinal: soft nontender, nondistended, normal bowel sounds, Skin, soft tissue: no rashes Neuro: nonfocal, strength and sensation intact   BMET  Recent Labs  11/26/16 0321 11/27/16 0613  NA 141 138  K 3.7 3.2*  CL 106 101  CO2 28 28  GLUCOSE 97 99  BUN 12 11  CREATININE 0.81 0.74  CALCIUM 7.3* 8.0*     Liver Panel   Recent Labs  11/25/16 1225 11/26/16 0321  PROT 7.2  --   ALBUMIN 3.1* 2.2*  AST 23  --   ALT 19  --   ALKPHOS 33*  --   BILITOT 0.8  --        Sedimentation Rate No results for input(s): ESRSEDRATE in the last 72 hours. C-Reactive Protein No results for input(s): CRP in the last 72 hours.  Micro Results: Recent Results (from the past 720 hour(s))  Blood Culture (routine x 2)     Status: Abnormal (Preliminary result)   Collection Time: 11/25/16 12:07 PM  Result Value Ref Range Status   Specimen Description BLOOD RIGHT ANTECUBITAL  Final   Special Requests   Final    BOTTLES DRAWN AEROBIC AND ANAEROBIC Blood Culture adequate volume   Culture  Setup Time   Final    ANAEROBIC BOTTLE ONLY GRAM POSITIVE COCCI IN CLUSTERS CRITICAL RESULT CALLED TO, READ BACK BY AND VERIFIED WITH: E WILLIAMSON 11/26/16 @ 0707 M VESTAL    Culture (A)  Final    STAPHYLOCOCCUS HOMINIS SUSCEPTIBILITIES TO FOLLOW Performed at [facility], [address], [place], [place] [address]    Report Status PENDING  Incomplete  Blood Culture ID Panel (Reflexed)     Status: Abnormal   Collection Time: 11/25/16 12:07 PM  Result Value Ref Range Status   Enterococcus species NOT DETECTED NOT DETECTED Final   Listeria monocytogenes NOT DETECTED NOT DETECTED Final   Staphylococcus species DETECTED (A) NOT DETECTED Final    Comment: Methicillin  (oxacillin) susceptible coagulase negative staphylococcus. Possible blood culture contaminant (unless isolated from more than one blood culture draw or clinical case suggests pathogenicity). No antibiotic  treatment is indicated for blood  culture contaminants. CRITICAL RESULT CALLED TO, READ BACK BY AND VERIFIED WITH: E WILLIAMSON 11/26/16 @ 9233 M VESTAL    Staphylococcus aureus NOT DETECTED NOT DETECTED Final   Methicillin resistance NOT DETECTED NOT DETECTED Final   Streptococcus species NOT DETECTED NOT DETECTED Final   Streptococcus agalactiae NOT DETECTED NOT DETECTED Final   Streptococcus pneumoniae NOT DETECTED NOT DETECTED Final   Streptococcus pyogenes NOT DETECTED NOT DETECTED Final   Acinetobacter baumannii NOT DETECTED NOT DETECTED Final   Enterobacteriaceae species NOT DETECTED NOT DETECTED Final   Enterobacter cloacae complex NOT DETECTED NOT DETECTED Final   Escherichia coli NOT DETECTED NOT DETECTED Final   Klebsiella oxytoca NOT DETECTED NOT DETECTED Final   Klebsiella pneumoniae NOT DETECTED NOT DETECTED Final   Proteus species NOT DETECTED NOT DETECTED Final   Serratia marcescens NOT DETECTED NOT DETECTED Final   Haemophilus influenzae NOT DETECTED NOT DETECTED Final   Neisseria meningitidis NOT DETECTED NOT DETECTED Final   Pseudomonas aeruginosa NOT DETECTED NOT DETECTED Final   Candida albicans NOT DETECTED NOT DETECTED Final   Candida glabrata NOT DETECTED NOT DETECTED Final   Candida krusei NOT DETECTED NOT DETECTED Final   Candida parapsilosis NOT DETECTED NOT DETECTED Final   Candida tropicalis NOT DETECTED NOT DETECTED Final    Comment: Performed at [facility], [place] [address] [place], [place] [address]  Blood Culture (routine x 2)     Status: Abnormal (Preliminary result)   Collection Time: 11/25/16 12:12 PM  Result Value Ref Range Status   Specimen Description BLOOD RIGHT HAND  Final   Special Requests   Final    BOTTLES DRAWN AEROBIC AND ANAEROBIC  Blood Culture adequate volume   Culture  Setup Time   Final    GRAM POSITIVE COCCI IN CLUSTERS AEROBIC BOTTLE ONLY CRITICAL VALUE NOTED.  VALUE IS CONSISTENT WITH PREVIOUSLY REPORTED AND CALLED VALUE.    Culture (A)  Final    STAPHYLOCOCCUS HOMINIS SUSCEPTIBILITIES TO FOLLOW Performed at [facility], [place] [address] [place], [place] [address]    Report Status PENDING  Incomplete  MRSA PCR Screening     Status: None   Collection Time: 11/25/16  2:43 PM  Result Value Ref Range Status   MRSA by PCR NEGATIVE NEGATIVE Final    Comment:        The GeneXpert MRSA Assay (FDA approved for NASAL specimens only), is one component of a comprehensive MRSA colonization surveillance program. It is not intended to diagnose MRSA infection nor to guide or monitor treatment for MRSA infections.   Culture, blood (x 2)     Status: None (Preliminary result)   Collection Time: 11/25/16  2:56 PM  Result Value Ref Range Status   Specimen Description BLOOD LEFT ARM  Final   Special Requests AEROBIC BOTTLE ONLY Blood Culture adequate volume  Final   Culture   Final    NO GROWTH 2 DAYS Performed at [facility], [address] [place],  [address]    Report Status PENDING  Incomplete  Culture, blood (x 2)     Status: None (Preliminary result)   Collection Time: 11/25/16  2:58 PM  Result Value Ref Range Status   Specimen Description BLOOD RIGHT HAND  Final   Special Requests   Final    BOTTLES DRAWN AEROBIC AND ANAEROBIC Blood Culture adequate volume   Culture   Final    NO GROWTH 2 DAYS Performed  at [facility], [place] [address], [place], [place] [address]    Report Status PENDING  Incomplete  Culture, sputum-assessment     Status: None   Collection Time: 11/25/16  3:32 PM  Result Value Ref Range Status   Specimen Description EXPECTORATED SPUTUM  Final   Special Requests NONE  Final   Sputum evaluation THIS SPECIMEN IS ACCEPTABLE FOR SPUTUM CULTURE  Final   Report  Status 11/25/2016 FINAL  Final  Culture, respiratory (NON-Expectorated)     Status: None (Preliminary result)   Collection Time: 11/25/16  3:32 PM  Result Value Ref Range Status   Specimen Description EXPECTORATED SPUTUM  Final   Special Requests NONE Reflexed from [id number]  Final   Gram Stain   Final    ABUNDANT WBC PRESENT, PREDOMINANTLY PMN ABUNDANT GRAM NEGATIVE RODS ABUNDANT GRAM POSITIVE COCCI IN PAIRS FEW GRAM POSITIVE RODS FEW YEAST MODERATE SQUAMOUS EPITHELIAL CELLS PRESENT    Culture   Final    CULTURE REINCUBATED FOR BETTER GROWTH Performed at [facility], [place] [address] [place], [place] [address]    Report Status PENDING  Incomplete    Studies/Results: No results found.    Assessment/Plan:  INTERVAL HISTORY: Staph hominis from blood cultures    Active Problems:   Community acquired pneumonia   Acute respiratory failure with hypoxia (HCC)   Influenza B   Coagulase negative Staphylococcus bacteremia    Courtney Brown is a 76 y.o. female with  Hx of COPD admitted with CAP after URI found to have influenza B, Coagulase negative Staphylococcal bacteremia and pneumococcal antigen + in urine  #1 MS  Staphylococcus hominis bacteremia:  I doubt endocarditis with this organism. I suspect there was secondary  infection after influenza B  Would give her 2 weeks of Ceftriaxone  Would then check surveillance blood cultures 2+ weeks AFTER finishing IV abx  #2 Pneumococal ag: will be covered for pneumococcus with 2 weeks of CTX  #3 Flu B: 5 days of tamiflu     LOS: 2 days   Alcide Evener 11/27/2016, 5:20 PM

## 2016-11-27 NOTE — Progress Notes (Signed)
  Echocardiogram 2D Echocardiogram has been performed.  Courtney Brown 11/27/2016, 8:59 AM

## 2016-11-27 NOTE — Progress Notes (Signed)
PHARMACY CONSULT NOTE FOR:  OUTPATIENT  PARENTERAL ANTIBIOTIC THERAPY (OPAT)  Indication: staph hominis bacteremia Regimen: ceftriaxone 2 gm IV q24h End date: 12/11/16  IV antibiotic discharge orders are pended. To discharging provider:  please sign these orders via discharge navigator,  Select New Orders & click on the button choice - Manage This Unsigned Work.     Thank you for allowing pharmacy to be a part of this patient's care.  Lynelle Doctor 11/27/2016, 5:55 PM

## 2016-11-27 NOTE — Progress Notes (Signed)
Notified Dr. Quincy Simmonds of patient's elevated BP, PRN order given for hydralazine, patient  Denies any distress, will continue to assess patient.

## 2016-11-27 NOTE — Progress Notes (Signed)
[facility] Coordinator will follow pt with ID team as requested by Dessa Phi, RN Case Management for possible home IV ABX.  If patient discharges after hours, please call [phone].   Larry Sierras 11/27/2016, 4:13 PM

## 2016-11-27 NOTE — Progress Notes (Signed)
PROGRESS NOTE [facility]   Olathe Kleist   JQG:[id number] DOB: Apr 27, 1941  DOA: 11/25/2016 PCP: Wilfred Lacy, NP   Brief Narrative:  Courtney Brown is a 76 y.o. female with past medical history of COPD, hypertension, MI in 2008, and hyperlipidemia presented to the emergency department complaining of shortness of breath, weak and tired associated with productive cough with fevers and chills the past 2 weeks. Patient was admitted with a chest x-ray coarsening of pneumonia in the right side, sepsis and positive influenza. Patient was started on empiric antibiotic, tamiflu and IVF. Patient was found to be bacteremic with coag negative staph. Also strep antigen was positive. ID consulted   Subjective: Patient seen and examined, doing slight better than yesterday although continues to be SOB. Cough still present. No acute events overnight. Remains afebrile.   Assessment & Plan: Acute respiratory failure due to Sepsis secondary to influenza pneumonia with superimposed strep pneumo.  Sepsis physiology resolved  Chest x-ray on admission showed right midlung and left base consistent with pneumonia. Influenza B+, Strep pneumoniae +  Patient on empiric antibiotics ceftriaxone and azithromycin and Tamiflu Will complete last dose of Azithromycin today  Continue Tamiflu and Ceftriaxone  Will give a dose of Solumedrol as patient continues to wheeze  If no improvement will repeat CXR in AM  Procalcitonin pending  Sputum culture pending  Continue current regimen, Hycodan, Brovana and Pulmicort Wean oxygen as tolerated to keep sats > 89%  Bacteremia coag negative staph - no risk factors  First set 11/25/16 of blood culture 2 bottles positive for staph species Second set of blood cultures done the same 11/25/16 day but 4 hour later shows NG Ceftriaxone dose increased to 2G  Follow up sensitivities  ID recommendations appreciated - ECHO pending  Length of abx pending final w/u   AKI  - 2/2 sepsis  - resolved  Initial Cr 1.01 - improved with IVF   Hypokalemia  Replete Check BMP in AM   Hypertension - BP slight elevated today  BP medication where held on admission due to low BP  Will resume home meds - Norvasc and Metoprolol  Monitor BP   DVT prophylaxis: Lovenox  Code Status: FULL  Family Communication: None at bedside  Disposition Plan: Likely home when medically stable   Consultants:   ID   Procedures:   None   Antimicrobials: Anti-infectives    Start     Dose/Rate Route Frequency Ordered Stop   11/27/16 1200  cefTRIAXone (ROCEPHIN) 2 g in dextrose 5 % 50 mL IVPB     2 g 100 mL/hr over 30 Minutes Intravenous Every 24 hours 11/26/16 1357     11/27/16 1200  azithromycin (ZITHROMAX) tablet 500 mg     500 mg Oral  Once 11/27/16 1033     11/26/16 1415  cefTRIAXone (ROCEPHIN) 1 g in dextrose 5 % 50 mL IVPB     1 g 100 mL/hr over 30 Minutes Intravenous  Once 11/26/16 1401 11/26/16 1515   11/26/16 1200  azithromycin (ZITHROMAX) 500 mg in dextrose 5 % 250 mL IVPB  Status:  Discontinued     500 mg 250 mL/hr over 60 Minutes Intravenous Every 24 hours 11/25/16 1257 11/27/16 1033   11/26/16 1200  cefTRIAXone (ROCEPHIN) 1 g in dextrose 5 % 50 mL IVPB  Status:  Discontinued     1 g 100 mL/hr over 30 Minutes Intravenous Every 24 hours 11/25/16 1257 11/26/16 1357   11/25/16 1530  oseltamivir (TAMIFLU) capsule 30 mg  30 mg Oral 2 times daily 11/25/16 1525 11/30/16 0959   11/25/16 1445  cefTRIAXone (ROCEPHIN) 1 g in dextrose 5 % 50 mL IVPB  Status:  Discontinued     1 g 100 mL/hr over 30 Minutes Intravenous  Once 11/25/16 1433 11/25/16 1435   11/25/16 1445  azithromycin (ZITHROMAX) 500 mg in dextrose 5 % 250 mL IVPB  Status:  Discontinued     500 mg 250 mL/hr over 60 Minutes Intravenous  Once 11/25/16 1433 11/25/16 1436   11/25/16 1215  cefTRIAXone (ROCEPHIN) 1 g in dextrose 5 % 50 mL IVPB     1 g 100 mL/hr over 30 Minutes Intravenous  Once 11/25/16 1206 11/25/16 1348    11/25/16 1215  azithromycin (ZITHROMAX) 500 mg in dextrose 5 % 250 mL IVPB     500 mg 250 mL/hr over 60 Minutes Intravenous  Once 11/25/16 1206 11/25/16 1416       Objective: Vitals:   11/26/16 2019 11/27/16 0523 11/27/16 0803 11/27/16 0804  BP: (!) 156/61 (!) 160/56    Pulse: 92 92    Resp: 20 20    Temp: 97.8 F (36.6 C) 98.1 F (36.7 C)    TempSrc: Oral Oral    SpO2: 94% 90% 92% 92%  Weight:  76.3 kg (168 lb 4.8 oz)    Height:        Intake/Output Summary (Last 24 hours) at 11/27/16 1103 Last data filed at 11/26/16 2200  Gross per 24 hour  Intake              660 ml  Output              300 ml  Net              360 ml   Filed Weights   11/25/16 1144 11/25/16 1500 11/27/16 0523  Weight: 77.1 kg (170 lb) 78.2 kg (172 lb 6.4 oz) 76.3 kg (168 lb 4.8 oz)    Examination:  General exam: NAD Respiratory system: Diffuse rhonchi and mild expiratory wheezing at the bases. Breath sounds diminished  Cardiovascular system: S1S2 RRR no murmurs  Gastrointestinal system: Abd soft NTND, hiatal hernia palpable  Central nervous system: AAOx3 Extremities: No LE edema  Skin: No rashes or lesions  Psychiatry: Mood appropriate    Data Reviewed: I have personally reviewed following labs and imaging studies  CBC:  Recent Labs Lab 11/25/16 1225 11/25/16 1456 11/26/16 0321 11/27/16 0613  WBC 13.5* 14.3* 12.7* 9.6  NEUTROABS 11.2*  --   --  6.6  HGB 15.8* 13.0 11.8* 12.3  HCT 48.2* 40.6 37.2 38.0  MCV 92.9 92.3 92.5 91.8  PLT 204 185 189 147   Basic Metabolic Panel:  Recent Labs Lab 11/25/16 1225 11/25/16 1456 11/26/16 0321 11/27/16 0613  NA 137  --  141 138  K 3.0*  --  3.7 3.2*  CL 98*  --  106 101  CO2 25  --  28 28  GLUCOSE 156*  --  97 99  BUN 12  --  12 11  CREATININE 1.01* 0.98 0.81 0.74  CALCIUM 8.3*  --  7.3* 8.0*   GFR: Estimated Creatinine Clearance: 56.8 mL/min (by C-G formula based on SCr of 0.74 mg/dL). Liver Function Tests:  Recent Labs Lab  11/25/16 1225 11/26/16 0321  AST 23  --   ALT 19  --   ALKPHOS 33*  --   BILITOT 0.8  --   PROT 7.2  --  ALBUMIN 3.1* 2.2*   CBG:  Recent Labs Lab 11/26/16 0738 11/27/16 0732  GLUCAP 101* 103*   Lipid Profile: No results for input(s): CHOL, HDL, LDLCALC, TRIG, CHOLHDL, LDLDIRECT in the last 72 hours. Thyroid Function Tests:  Recent Labs  11/25/16 1456  TSH 0.604   Anemia Panel: No results for input(s): VITAMINB12, FOLATE, FERRITIN, TIBC, IRON, RETICCTPCT in the last 72 hours. Sepsis Labs:  Recent Labs Lab 11/25/16 1456 11/25/16 1743 11/25/16 2009 11/25/16 2259  PROCALCITON 1.11  --   --   --   LATICACIDVEN 2.6* 3.5* 1.8 1.5    Recent Results (from the past 240 hour(s))  Blood Culture (routine x 2)     Status: None (Preliminary result)   Collection Time: 11/25/16 12:07 PM  Result Value Ref Range Status   Specimen Description BLOOD RIGHT ANTECUBITAL  Final   Special Requests   Final    BOTTLES DRAWN AEROBIC AND ANAEROBIC Blood Culture adequate volume   Culture  Setup Time   Final    ANAEROBIC BOTTLE ONLY GRAM POSITIVE COCCI IN CLUSTERS CRITICAL RESULT CALLED TO, READ BACK BY AND VERIFIED WITH: E WILLIAMSON 11/26/16 @ 0707 M VESTAL    Culture   Final    GRAM POSITIVE COCCI IDENTIFICATION TO FOLLOW Performed at [facility], [place] [address], [place], [place] [address]    Report Status PENDING  Incomplete  Blood Culture ID Panel (Reflexed)     Status: Abnormal   Collection Time: 11/25/16 12:07 PM  Result Value Ref Range Status   Enterococcus species NOT DETECTED NOT DETECTED Final   Listeria monocytogenes NOT DETECTED NOT DETECTED Final   Staphylococcus species DETECTED (A) NOT DETECTED Final    Comment: Methicillin (oxacillin) susceptible coagulase negative staphylococcus. Possible blood culture contaminant (unless isolated from more than one blood culture draw or clinical case suggests pathogenicity). No antibiotic treatment is indicated for blood    culture contaminants. CRITICAL RESULT CALLED TO, READ BACK BY AND VERIFIED WITH: E WILLIAMSON 11/26/16 @ 1914 M VESTAL    Staphylococcus aureus NOT DETECTED NOT DETECTED Final   Methicillin resistance NOT DETECTED NOT DETECTED Final   Streptococcus species NOT DETECTED NOT DETECTED Final   Streptococcus agalactiae NOT DETECTED NOT DETECTED Final   Streptococcus pneumoniae NOT DETECTED NOT DETECTED Final   Streptococcus pyogenes NOT DETECTED NOT DETECTED Final   Acinetobacter baumannii NOT DETECTED NOT DETECTED Final   Enterobacteriaceae species NOT DETECTED NOT DETECTED Final   Enterobacter cloacae complex NOT DETECTED NOT DETECTED Final   Escherichia coli NOT DETECTED NOT DETECTED Final   Klebsiella oxytoca NOT DETECTED NOT DETECTED Final   Klebsiella pneumoniae NOT DETECTED NOT DETECTED Final   Proteus species NOT DETECTED NOT DETECTED Final   Serratia marcescens NOT DETECTED NOT DETECTED Final   Haemophilus influenzae NOT DETECTED NOT DETECTED Final   Neisseria meningitidis NOT DETECTED NOT DETECTED Final   Pseudomonas aeruginosa NOT DETECTED NOT DETECTED Final   Candida albicans NOT DETECTED NOT DETECTED Final   Candida glabrata NOT DETECTED NOT DETECTED Final   Candida krusei NOT DETECTED NOT DETECTED Final   Candida parapsilosis NOT DETECTED NOT DETECTED Final   Candida tropicalis NOT DETECTED NOT DETECTED Final    Comment: Performed at [facility], [place] [address], [place], [place] [address]  Blood Culture (routine x 2)     Status: None (Preliminary result)   Collection Time: 11/25/16 12:12 PM  Result Value Ref Range Status   Specimen Description BLOOD RIGHT HAND  Final   Special Requests   Final    BOTTLES DRAWN AEROBIC AND ANAEROBIC Blood Culture adequate volume   Culture  Setup Time   Final    GRAM POSITIVE COCCI IN CLUSTERS AEROBIC BOTTLE ONLY CRITICAL VALUE NOTED.  VALUE IS CONSISTENT WITH PREVIOUSLY REPORTED AND CALLED VALUE.    Culture   Final    GRAM  POSITIVE COCCI IDENTIFICATION TO FOLLOW Performed at [facility], [place] [address] [place], [place] [address]    Report Status PENDING  Incomplete  MRSA PCR Screening     Status: None   Collection Time: 11/25/16  2:43 PM  Result Value Ref Range Status   MRSA by PCR NEGATIVE NEGATIVE Final    Comment:        The GeneXpert MRSA Assay (FDA approved for NASAL specimens only), is one component of a comprehensive MRSA colonization surveillance program. It is not intended to diagnose MRSA infection nor to guide or monitor treatment for MRSA infections.   Culture, blood (x 2)     Status: None (Preliminary result)   Collection Time: 11/25/16  2:56 PM  Result Value Ref Range Status   Specimen Description BLOOD LEFT ARM  Final   Special Requests AEROBIC BOTTLE ONLY Blood Culture adequate volume  Final   Culture   Final    NO GROWTH 2 DAYS Performed at [facility], [address], [place], [place] [address]    Report Status PENDING  Incomplete  Culture, blood (x 2)     Status: None (Preliminary result)   Collection Time: 11/25/16  2:58 PM  Result Value Ref Range Status   Specimen Description BLOOD RIGHT HAND  Final   Special Requests   Final    BOTTLES DRAWN AEROBIC AND ANAEROBIC Blood Culture adequate volume   Culture   Final    NO GROWTH 2 DAYS Performed at [facility], [place] [address], [place], [place] [address]    Report Status PENDING  Incomplete  Culture, sputum-assessment     Status: None   Collection Time: 11/25/16  3:32 PM  Result Value Ref Range Status   Specimen Description EXPECTORATED SPUTUM  Final   Special Requests NONE  Final   Sputum evaluation THIS SPECIMEN IS ACCEPTABLE FOR SPUTUM CULTURE  Final   Report Status 11/25/2016 FINAL  Final  Culture, respiratory (NON-Expectorated)     Status: None (Preliminary result)   Collection Time: 11/25/16  3:32 PM  Result Value Ref Range Status   Specimen Description EXPECTORATED SPUTUM  Final   Special  Requests NONE Reflexed from [id number]  Final   Gram Stain   Final    ABUNDANT WBC PRESENT, PREDOMINANTLY PMN ABUNDANT GRAM NEGATIVE RODS ABUNDANT GRAM POSITIVE COCCI IN PAIRS FEW GRAM POSITIVE RODS FEW YEAST MODERATE SQUAMOUS EPITHELIAL CELLS PRESENT    Culture   Final    CULTURE REINCUBATED FOR BETTER GROWTH Performed at [facility], [place] [address] [place], [place] [address]    Report Status PENDING  Incomplete      Radiology Studies: Dg Chest Port 1 View  Result Date: 11/25/2016 CLINICAL DATA:  Cough with shortness of breath EXAM: PORTABLE CHEST 1 VIEW COMPARISON:  August 08, 2016 FINDINGS: There is focal airspace consolidation in the periphery of the right mid lung. There is also subtle infiltrate in the left base. Lungs elsewhere clear. Heart is mildly enlarged with pulmonary vascularity within normal limits. No adenopathy. There is aortic atherosclerosis. No bone lesions. IMPRESSION:  Airspace consolidation in the right mid lung and to a lesser extent in the left base consistent with pneumonia. Lungs elsewhere clear. Stable cardiac prominence. There is aortic atherosclerosis. Followup PA and lateral chest radiographs recommended in 3-4 weeks following trial of antibiotic therapy to ensure resolution and exclude underlying malignancy. Electronically Signed   By: Lowella Grip III M.D.   On: 11/25/2016 12:43    Scheduled Meds: . arformoterol  15 mcg Nebulization BID  . aspirin  81 mg Oral Daily  . azithromycin  500 mg Oral Once  . budesonide (PULMICORT) nebulizer solution  0.5 mg Nebulization BID  . chlorhexidine  15 mL Mouth Rinse BID  . colesevelam  625 mg Oral BID WC  . enoxaparin (LOVENOX) injection  40 mg Subcutaneous Q24H  . fenofibrate  160 mg Oral Daily  . gabapentin  100 mg Oral TID  . loratadine  10 mg Oral Daily  . mouth rinse  15 mL Mouth Rinse q12n4p  . methylPREDNISolone (SOLU-MEDROL) injection  125 mg Intravenous Once  . oseltamivir  30 mg Oral BID  .  pantoprazole  40 mg Oral Daily  . potassium chloride  40 mEq Oral Q4H  . pravastatin  40 mg Oral QHS  . raloxifene  60 mg Oral Daily  . sodium chloride flush  3 mL Intravenous Q12H  . tiotropium  18 mcg Inhalation Daily   Continuous Infusions: . cefTRIAXone (ROCEPHIN)  IV       LOS: 2 days    Chipper Oman, MD Pager: Text Page via [email or web address]  [phone]  If 7PM-7AM, please contact night-coverage [email or web address] Password [email or web address] 11/27/2016, 11:03 AM

## 2016-11-27 NOTE — Care Management Note (Signed)
Case Management Note  Patient Details  Name: Courtney Brown MRN: [id number] Date of Birth: [birth year]  Subjective/Objective:  Transfer from SDU. Sepsis, Bld cx+staph-bacteremia. ID following. Will monitor for CM needs.                  Action/Plan:d/c plan home.   Expected Discharge Date:   (unknown)               Expected Discharge Plan:  Home/Self Care  In-House Referral:     Discharge planning Services  CM Consult  Post Acute Care Choice:    Choice offered to:     DME Arranged:    DME Agency:     HH Arranged:    HH Agency:     Status of Service:  In process, will continue to follow  If discussed at Long Length of Stay Meetings, dates discussed:    Additional Comments:  Dessa Phi, RN 11/27/2016, 12:15 PM

## 2016-11-28 LAB — CULTURE, RESPIRATORY W GRAM STAIN: Culture: NORMAL

## 2016-11-28 LAB — BASIC METABOLIC PANEL
ANION GAP: 9 (ref 5–15)
BUN: 12 mg/dL (ref 6–20)
CALCIUM: 8.6 mg/dL — AB (ref 8.9–10.3)
CO2: 28 mmol/L (ref 22–32)
CREATININE: 0.56 mg/dL (ref 0.44–1.00)
Chloride: 103 mmol/L (ref 101–111)
Glucose, Bld: 139 mg/dL — ABNORMAL HIGH (ref 65–99)
Potassium: 3.7 mmol/L (ref 3.5–5.1)
SODIUM: 140 mmol/L (ref 135–145)

## 2016-11-28 LAB — CULTURE, RESPIRATORY

## 2016-11-28 LAB — GLUCOSE, CAPILLARY: Glucose-Capillary: 127 mg/dL — ABNORMAL HIGH (ref 65–99)

## 2016-11-28 MED ORDER — PREDNISOLONE 5 MG PO TABS: 10.0000 mg | ORAL_TABLET | Freq: Every day | ORAL | Status: DC

## 2016-11-28 MED ORDER — AMLODIPINE BESYLATE 10 MG PO TABS
10.0000 mg | ORAL_TABLET | Freq: Every day | ORAL | Status: DC
  Administered 2016-11-28: 10 mg via ORAL
  Filled 2016-11-28: qty 1

## 2016-11-28 MED ORDER — SODIUM CHLORIDE 0.9% FLUSH: 10.0000 mL | INTRAVENOUS | Status: DC | PRN

## 2016-11-28 MED ORDER — PREDNISOLONE 5 MG PO TABS
40.0000 mg | ORAL_TABLET | Freq: Every day | ORAL | Status: DC
  Administered 2016-11-29: 40 mg via ORAL
  Filled 2016-11-28: qty 8

## 2016-11-28 NOTE — Progress Notes (Signed)
May need home 02-currently on 02 1l, trying to wean-will monitor if needed @ home.

## 2016-11-28 NOTE — Progress Notes (Signed)
Peripherally Inserted Central Catheter/Midline Placement  The IV Nurse has discussed with the patient and/or persons authorized to consent for the patient, the purpose of this procedure and the potential benefits and risks involved with this procedure.  The benefits include less needle sticks, lab draws from the catheter, and the patient may be discharged home with the catheter. Risks include, but not limited to, infection, bleeding, blood clot (thrombus formation), and puncture of an artery; nerve damage and irregular heartbeat and possibility to perform a PICC exchange if needed/ordered by physician.  Alternatives to this procedure were also discussed.  Bard Power PICC patient education guide, fact sheet on infection prevention and patient information card has been provided to patient /or left at bedside.    PICC/Midline Placement Documentation        Courtney Brown 11/28/2016, 6:21 PM

## 2016-11-28 NOTE — Care Management Note (Signed)
Case Management Note  Patient Details  Name: Courtney Brown MRN: [id number] Date of Birth: [birth year]  Subjective/Objective:  Spoke to dtr [facility] for iv infusion, & HHRN-iv abx teaching-rep Pam will call dtr to make arrangements for infusion, [facility] rep Kim aware of HHRN-iv abx instruction. Await PICC placement.                 Action/Plan:d/c home w/HHC-iv abx   Expected Discharge Date:   (unknown)               Expected Discharge Plan:  [place]  In-House Referral:     Discharge planning Services  CM Consult  Post Acute Care Choice:    Choice offered to:  Patient, Adult Children  DME Arranged:    DME Agency:     HH Arranged:  RN, IV Antibiotics HH Agency:  [place]  Status of Service:  Completed, signed off  If discussed at [employer] of Stay Meetings, dates discussed:    Additional Comments:  Dessa Phi, RN 11/28/2016, 2:51 PM

## 2016-11-28 NOTE — Evaluation (Signed)
Physical Therapy Evaluation Patient Details Name: Courtney Brown MRN: [id number] DOB: 1940-08-07 Today's Date: 11/28/2016   History of Present Illness  76 yo female admitted with Pna, sepsis, +flu. Hx of COPD, CVA, osteoporosis, MI.   Clinical Impression  On eval, pt was Min guard assist for mobility. She walked ~100 feet with a RW. O2 sats 91% on RA at rest, 88% on RA during ambulation, dyspnea 2/4 (noted some wheezing as well). Recommend daily ambulation with nursing assistance (bathroom instead of BSC when possible). Will follow.     Follow Up Recommendations No PT follow up;Supervision - Intermittent (pt declined HHPT f/u during PT session)    Equipment Recommendations  None recommended by PT    Recommendations for Other Services       Precautions / Restrictions Precautions Precautions: Fall Precaution Comments: monitor O2 sats. + flu Restrictions Weight Bearing Restrictions: No      Mobility  Bed Mobility Overal bed mobility: Modified Independent                Transfers Overall transfer level: Needs assistance Equipment used: None Transfers: Sit to/from Stand           General transfer comment: close guard for safety.   Ambulation/Gait Ambulation/Gait assistance: Min guard Ambulation Distance (Feet): 100 Feet Assistive device: Rolling walker (2 wheeled) Gait Pattern/deviations: Step-through pattern;Decreased stride length     General Gait Details: pt reported her legs felt weak. Used RW for safety. O2 sats 88% on RA.   Stairs            Wheelchair Mobility    Modified Rankin (Stroke Patients Only)       Balance                                             Pertinent Vitals/Pain Pain Assessment: No/denies pain    Home Living Family/patient expects to be discharged to:: Private residence Living Arrangements: Children Available Help at Discharge: Family Type of Home: House Home Access: Stairs to enter Entrance  Stairs-Rails: Right Entrance Stairs-Number of Steps: 2 Home Layout: One level Home Equipment: Environmental consultant - 2 wheels      Prior Function Level of Independence: Independent               Hand Dominance        Extremity/Trunk Assessment   Upper Extremity Assessment Upper Extremity Assessment: Generalized weakness    Lower Extremity Assessment Lower Extremity Assessment: Generalized weakness    Cervical / Trunk Assessment Cervical / Trunk Assessment: Normal  Communication   Communication: No difficulties  Cognition Arousal/Alertness: Awake/alert Behavior During Therapy: WFL for tasks assessed/performed Overall Cognitive Status: Within Functional Limits for tasks assessed                                        General Comments      Exercises     Assessment/Plan    PT Assessment Patient needs continued PT services  PT Problem List Decreased mobility;Decreased activity tolerance       PT Treatment Interventions DME instruction;Therapeutic activities;Gait training;Therapeutic exercise;Patient/family education;Functional mobility training;Balance training    PT Goals (Current goals can be found in the Care Plan section)  Acute Rehab PT Goals Patient Stated Goal: to get better PT Goal Formulation: With  patient Time For Goal Achievement: 12/12/16 Potential to Achieve Goals: Good    Frequency Min 3X/week   Barriers to discharge        Co-evaluation               AM-PAC PT "6 Clicks" Daily Activity  Outcome Measure Difficulty turning over in bed (including adjusting bedclothes, sheets and blankets)?: None Difficulty moving from lying on back to sitting on the side of the bed? : None Difficulty sitting down on and standing up from a chair with arms (e.g., wheelchair, bedside commode, etc,.)?: A Little Help needed moving to and from a bed to chair (including a wheelchair)?: A Little Help needed walking in hospital room?: A Little Help  needed climbing 3-5 steps with a railing? : A Little 6 Click Score: 20    End of Session   Activity Tolerance: Patient tolerated treatment well Patient left: in chair;with call bell/phone within reach   PT Visit Diagnosis: Muscle weakness (generalized) (M62.81);Difficulty in walking, not elsewhere classified (R26.2)    Time: 1000-1019 PT Time Calculation (min) (ACUTE ONLY): 19 min   Charges:   PT Evaluation $PT Eval Low Complexity: 1 Procedure     PT G Codes:          Weston Anna, MPT Pager: [phone]

## 2016-11-28 NOTE — Care Management Note (Signed)
Case Management Note  Patient Details  Name: Courtney Brown MRN: [id number] Date of Birth: Sep 20, 1940  Subjective/Objective: Noted for long term iv abx. Provided [place] agency list to patient for choice-patient defers to dtr TC [name] [phone] await call back for choice.  For PICC.                   Action/Plan:d/c plan home w/HHC-infusion/HHRN-instruction.   Expected Discharge Date:   (unknown)               Expected Discharge Plan:  [place]  In-House Referral:     Discharge planning Services  CM Consult  Post Acute Care Choice:    Choice offered to:  Patient  DME Arranged:    DME Agency:     HH Arranged:    [place] Agency:     Status of Service:  In process, will continue to follow  If discussed at Long Length of Stay Meetings, dates discussed:    Additional Comments:  Dessa Phi, RN 11/28/2016, 12:57 PM

## 2016-11-28 NOTE — Progress Notes (Signed)
PROGRESS NOTE [facility]   [place]   ZHY:[id number] DOB: May 30, 1941  DOA: 11/25/2016 PCP: Wilfred Lacy, NP   Brief Narrative:  Courtney Brown is a 76 y.o. female with past medical history of COPD, hypertension, MI in 2008, and hyperlipidemia presented to the emergency department complaining of shortness of breath, weak and tired associated with productive cough with fevers and chills the past 2 weeks. Patient was admitted with a chest x-ray coarsening of pneumonia in the right side, sepsis and positive influenza. Patient was started on empiric antibiotic, tamiflu and IVF. Patient was found to be bacteremic with coag negative staph. Also strep antigen was positive. ID consulted   Subjective: Patient seen and examined, continues to do well, have no new complaints other than persistent cough. Remains afebrile.  Assessment & Plan: Acute respiratory failure due to Sepsis secondary to influenza pneumonia with superimposed strep pneumo.  Sepsis physiology resolved  Chest x-ray on admission showed right midlung and left base consistent with pneumonia. Influenza B+, Strep pneumoniae urine antigen +  Patient treated with empiric antibiotics ceftriaxone and azithromycin and Tamiflu Completed course of azithromycin , will complete 2 weeks of CTX end date 12/09/16 Continue current regimen Tamiflu and Ceftriaxone  Prednisolone x 5 day added given rhonchi and wheezing  Continue current regimen, Hycodan, Brovana and Pulmicort Wean oxygen as tolerated to keep sats > 89% Get O2 sat with ambulation - may need to go home on O2   Bacteremia Staph Hominis - no risk factors  First set 11/25/16 of blood culture 2 bottles positive for staph species Second set of blood cultures done the same 11/25/16 day but 4 hour later shows NG Continue Ceftriaxone for 2 weeks from last negative blood cultures, repeat blood Cx after treatment is completed  PICC line ordered, Home health services requested for abx  administration  Sensitivities still pending  ID recommendations appreciated  TTE done show no vegetation but unlikely that this organism causing endocarditis   AKI  - 2/2 sepsis - resolved  Initial Cr 1.01 - improved with IVF   Hypokalemia - resolved  Replete Check BMP in AM   Hypertension - BP remains in the high side  BP medication where held on admission due to low BP  Will increase Norvasc to 10 mg  Continue Metoprolol 50 mg BID  Monitor BP   DVT prophylaxis: Lovenox  Code Status: FULL  Family Communication: None at bedside  Disposition Plan: If sensitivities available in the morning will d/c on 11/29/16  Consultants:   ID   Procedures:   None   Antimicrobials: Anti-infectives    Start     Dose/Rate Route Frequency Ordered Stop   11/27/16 1200  cefTRIAXone (ROCEPHIN) 2 g in dextrose 5 % 50 mL IVPB     2 g 100 mL/hr over 30 Minutes Intravenous Every 24 hours 11/26/16 1357     11/27/16 1200  azithromycin (ZITHROMAX) tablet 500 mg     500 mg Oral  Once 11/27/16 1033 11/27/16 1241   11/26/16 1415  cefTRIAXone (ROCEPHIN) 1 g in dextrose 5 % 50 mL IVPB     1 g 100 mL/hr over 30 Minutes Intravenous  Once 11/26/16 1401 11/26/16 1515   11/26/16 1200  azithromycin (ZITHROMAX) 500 mg in dextrose 5 % 250 mL IVPB  Status:  Discontinued     500 mg 250 mL/hr over 60 Minutes Intravenous Every 24 hours 11/25/16 1257 11/27/16 1033   11/26/16 1200  cefTRIAXone (ROCEPHIN) 1 g in dextrose 5 %  50 mL IVPB  Status:  Discontinued     1 g 100 mL/hr over 30 Minutes Intravenous Every 24 hours 11/25/16 1257 11/26/16 1357   11/25/16 1530  oseltamivir (TAMIFLU) capsule 30 mg     30 mg Oral 2 times daily 11/25/16 1525 11/30/16 0959   11/25/16 1445  cefTRIAXone (ROCEPHIN) 1 g in dextrose 5 % 50 mL IVPB  Status:  Discontinued     1 g 100 mL/hr over 30 Minutes Intravenous  Once 11/25/16 1433 11/25/16 1435   11/25/16 1445  azithromycin (ZITHROMAX) 500 mg in dextrose 5 % 250 mL IVPB  Status:   Discontinued     500 mg 250 mL/hr over 60 Minutes Intravenous  Once 11/25/16 1433 11/25/16 1436   11/25/16 1215  cefTRIAXone (ROCEPHIN) 1 g in dextrose 5 % 50 mL IVPB     1 g 100 mL/hr over 30 Minutes Intravenous  Once 11/25/16 1206 11/25/16 1348   11/25/16 1215  azithromycin (ZITHROMAX) 500 mg in dextrose 5 % 250 mL IVPB     500 mg 250 mL/hr over 60 Minutes Intravenous  Once 11/25/16 1206 11/25/16 1416      Objective: Vitals:   11/28/16 0500 11/28/16 0826 11/28/16 0928 11/28/16 1443  BP:   (!) 145/62 (!) 160/58  Pulse:   72 74  Resp:    18  Temp:    97.7 F (36.5 C)  TempSrc:    Oral  SpO2:  90%    Weight: 78.1 kg (172 lb 3.2 oz)     Height:       No intake or output data in the 24 hours ending 11/28/16 1510 Filed Weights   11/25/16 1500 11/27/16 0523 11/28/16 0500  Weight: 78.2 kg (172 lb 6.4 oz) 76.3 kg (168 lb 4.8 oz) 78.1 kg (172 lb 3.2 oz)    Examination:  General exam: Calm Respiratory system: Continues with diffuse rhonchi although improved. Wheezing at the bases late expiratory phase  Cardiovascular system: S1S2 RRR no murmurs  Gastrointestinal system: Abd NTND, hiatal hernia palpable   Central nervous system: Alert oriented  Extremities: No peripheral edema Skin: No lesions   Data Reviewed: I have personally reviewed following labs and imaging studies  CBC:  Recent Labs Lab 11/25/16 1225 11/25/16 1456 11/26/16 0321 11/27/16 0613  WBC 13.5* 14.3* 12.7* 9.6  NEUTROABS 11.2*  --   --  6.6  HGB 15.8* 13.0 11.8* 12.3  HCT 48.2* 40.6 37.2 38.0  MCV 92.9 92.3 92.5 91.8  PLT 204 185 189 413   Basic Metabolic Panel:  Recent Labs Lab 11/25/16 1225 11/25/16 1456 11/26/16 0321 11/27/16 0613 11/28/16 0604  NA 137  --  141 138 140  K 3.0*  --  3.7 3.2* 3.7  CL 98*  --  106 101 103  CO2 25  --  28 28 28   GLUCOSE 156*  --  97 99 139*  BUN 12  --  12 11 12   CREATININE 1.01* 0.98 0.81 0.74 0.56  CALCIUM 8.3*  --  7.3* 8.0* 8.6*   GFR: Estimated  Creatinine Clearance: 57.5 mL/min (by C-G formula based on SCr of 0.56 mg/dL). Liver Function Tests:  Recent Labs Lab 11/25/16 1225 11/26/16 0321  AST 23  --   ALT 19  --   ALKPHOS 33*  --   BILITOT 0.8  --   PROT 7.2  --   ALBUMIN 3.1* 2.2*   CBG:  Recent Labs Lab 11/26/16 2440 11/27/16 0732 11/28/16 1027  GLUCAP 101* 103* 127*   Lipid Profile: No results for input(s): CHOL, HDL, LDLCALC, TRIG, CHOLHDL, LDLDIRECT in the last 72 hours. Thyroid Function Tests: No results for input(s): TSH, T4TOTAL, FREET4, T3FREE, THYROIDAB in the last 72 hours. Anemia Panel: No results for input(s): VITAMINB12, FOLATE, FERRITIN, TIBC, IRON, RETICCTPCT in the last 72 hours. Sepsis Labs:  Recent Labs Lab 11/25/16 1456 11/25/16 1743 11/25/16 2009 11/25/16 2259 11/27/16 0613  PROCALCITON 1.11  --   --   --  0.89  LATICACIDVEN 2.6* 3.5* 1.8 1.5  --     Recent Results (from the past 240 hour(s))  Blood Culture (routine x 2)     Status: Abnormal (Preliminary result)   Collection Time: 11/25/16 12:07 PM  Result Value Ref Range Status   Specimen Description BLOOD RIGHT ANTECUBITAL  Final   Special Requests   Final    BOTTLES DRAWN AEROBIC AND ANAEROBIC Blood Culture adequate volume   Culture  Setup Time   Final    ANAEROBIC BOTTLE ONLY GRAM POSITIVE COCCI IN CLUSTERS CRITICAL RESULT CALLED TO, READ BACK BY AND VERIFIED WITH: E WILLIAMSON 11/26/16 @ 67 M VESTAL    Culture (A)  Final    STAPHYLOCOCCUS HOMINIS SUSCEPTIBILITIES TO FOLLOW REPEATING SUSCEPTIBILITIES Performed at [facility], [place] [address] [place], [place] [address]    Report Status PENDING  Incomplete  Blood Culture ID Panel (Reflexed)     Status: Abnormal   Collection Time: 11/25/16 12:07 PM  Result Value Ref Range Status   Enterococcus species NOT DETECTED NOT DETECTED Final   Listeria monocytogenes NOT DETECTED NOT DETECTED Final   Staphylococcus species DETECTED (A) NOT DETECTED Final    Comment:  Methicillin (oxacillin) susceptible coagulase negative staphylococcus. Possible blood culture contaminant (unless isolated from more than one blood culture draw or clinical case suggests pathogenicity). No antibiotic treatment is indicated for blood  culture contaminants. CRITICAL RESULT CALLED TO, READ BACK BY AND VERIFIED WITH: E WILLIAMSON 11/26/16 @ 2637 M VESTAL    Staphylococcus aureus NOT DETECTED NOT DETECTED Final   Methicillin resistance NOT DETECTED NOT DETECTED Final   Streptococcus species NOT DETECTED NOT DETECTED Final   Streptococcus agalactiae NOT DETECTED NOT DETECTED Final   Streptococcus pneumoniae NOT DETECTED NOT DETECTED Final   Streptococcus pyogenes NOT DETECTED NOT DETECTED Final   Acinetobacter baumannii NOT DETECTED NOT DETECTED Final   Enterobacteriaceae species NOT DETECTED NOT DETECTED Final   Enterobacter cloacae complex NOT DETECTED NOT DETECTED Final   Escherichia coli NOT DETECTED NOT DETECTED Final   Klebsiella oxytoca NOT DETECTED NOT DETECTED Final   Klebsiella pneumoniae NOT DETECTED NOT DETECTED Final   Proteus species NOT DETECTED NOT DETECTED Final   Serratia marcescens NOT DETECTED NOT DETECTED Final   Haemophilus influenzae NOT DETECTED NOT DETECTED Final   Neisseria meningitidis NOT DETECTED NOT DETECTED Final   Pseudomonas aeruginosa NOT DETECTED NOT DETECTED Final   Candida albicans NOT DETECTED NOT DETECTED Final   Candida glabrata NOT DETECTED NOT DETECTED Final   Candida krusei NOT DETECTED NOT DETECTED Final   Candida parapsilosis NOT DETECTED NOT DETECTED Final   Candida tropicalis NOT DETECTED NOT DETECTED Final    Comment: Performed at [facility], [place] [address] [place], [place] [address]  Blood Culture (routine x 2)     Status: Abnormal (Preliminary result)   Collection Time: 11/25/16 12:12 PM  Result Value Ref Range Status   Specimen Description BLOOD RIGHT HAND  Final   Special Requests  Final    BOTTLES DRAWN AEROBIC  AND ANAEROBIC Blood Culture adequate volume   Culture  Setup Time   Final    GRAM POSITIVE COCCI IN CLUSTERS AEROBIC BOTTLE ONLY CRITICAL VALUE NOTED.  VALUE IS CONSISTENT WITH PREVIOUSLY REPORTED AND CALLED VALUE.    Culture (A)  Final    STAPHYLOCOCCUS HOMINIS SUSCEPTIBILITIES TO FOLLOW Performed at [facility], [place] [address], [place], [place] [address]    Report Status PENDING  Incomplete  MRSA PCR Screening     Status: None   Collection Time: 11/25/16  2:43 PM  Result Value Ref Range Status   MRSA by PCR NEGATIVE NEGATIVE Final    Comment:        The GeneXpert MRSA Assay (FDA approved for NASAL specimens only), is one component of a comprehensive MRSA colonization surveillance program. It is not intended to diagnose MRSA infection nor to guide or monitor treatment for MRSA infections.   Culture, blood (x 2)     Status: None (Preliminary result)   Collection Time: 11/25/16  2:56 PM  Result Value Ref Range Status   Specimen Description BLOOD LEFT ARM  Final   Special Requests AEROBIC BOTTLE ONLY Blood Culture adequate volume  Final   Culture   Final    NO GROWTH 3 DAYS Performed at [facility], [address] [place], [place] [address]    Report Status PENDING  Incomplete  Culture, blood (x 2)     Status: None (Preliminary result)   Collection Time: 11/25/16  2:58 PM  Result Value Ref Range Status   Specimen Description BLOOD RIGHT HAND  Final   Special Requests   Final    BOTTLES DRAWN AEROBIC AND ANAEROBIC Blood Culture adequate volume   Culture   Final    NO GROWTH 3 DAYS Performed at [facility], [place] [address], [place], [place] [address]    Report Status PENDING  Incomplete  Culture, sputum-assessment     Status: None   Collection Time: 11/25/16  3:32 PM  Result Value Ref Range Status   Specimen Description EXPECTORATED SPUTUM  Final   Special Requests NONE  Final   Sputum evaluation THIS SPECIMEN IS ACCEPTABLE FOR SPUTUM CULTURE   Final   Report Status 11/25/2016 FINAL  Final  Culture, respiratory (NON-Expectorated)     Status: None   Collection Time: 11/25/16  3:32 PM  Result Value Ref Range Status   Specimen Description EXPECTORATED SPUTUM  Final   Special Requests NONE Reflexed from [id number]  Final   Gram Stain   Final    ABUNDANT WBC PRESENT, PREDOMINANTLY PMN ABUNDANT GRAM NEGATIVE RODS ABUNDANT GRAM POSITIVE COCCI IN PAIRS FEW GRAM POSITIVE RODS FEW YEAST MODERATE SQUAMOUS EPITHELIAL CELLS PRESENT    Culture   Final    Consistent with normal respiratory flora. Performed at [facility], [place] [address], [place], [place] [address]    Report Status 11/28/2016 FINAL  Final     Radiology Studies: No results found.  Scheduled Meds: . amLODipine  5 mg Oral QHS  . arformoterol  15 mcg Nebulization BID  . aspirin  81 mg Oral Daily  . budesonide (PULMICORT) nebulizer solution  0.5 mg Nebulization BID  . chlorhexidine  15 mL Mouth Rinse BID  . colesevelam  625 mg Oral BID WC  . enoxaparin (LOVENOX) injection  40 mg Subcutaneous Q24H  . fenofibrate  160 mg Oral Daily  . gabapentin  100 mg Oral  TID  . loratadine  10 mg Oral Daily  . mouth rinse  15 mL Mouth Rinse q12n4p  . metoprolol succinate  50 mg Oral BID  . oseltamivir  30 mg Oral BID  . pantoprazole  40 mg Oral Daily  . pravastatin  40 mg Oral QHS  . [START ON 11/29/2016] prednisoLONE  40 mg Oral QAC breakfast  . raloxifene  60 mg Oral Daily  . sodium chloride flush  3 mL Intravenous Q12H  . tiotropium  18 mcg Inhalation Daily   Continuous Infusions: . cefTRIAXone (ROCEPHIN)  IV Stopped (11/28/16 1213)     LOS: 3 days    Chipper Oman, MD Pager: Text Page via [email or web address]  [phone]  If 7PM-7AM, please contact night-coverage [email or web address] Password [email or web address] 11/28/2016, 3:10 PM

## 2016-11-28 NOTE — Progress Notes (Signed)
Subjective: No new complaints   Antibiotics:  Anti-infectives    Start     Dose/Rate Route Frequency Ordered Stop   11/27/16 1200  cefTRIAXone (ROCEPHIN) 2 g in dextrose 5 % 50 mL IVPB     2 g 100 mL/hr over 30 Minutes Intravenous Every 24 hours 11/26/16 1357     11/27/16 1200  azithromycin (ZITHROMAX) tablet 500 mg     500 mg Oral  Once 11/27/16 1033 11/27/16 1241   11/26/16 1415  cefTRIAXone (ROCEPHIN) 1 g in dextrose 5 % 50 mL IVPB     1 g 100 mL/hr over 30 Minutes Intravenous  Once 11/26/16 1401 11/26/16 1515   11/26/16 1200  azithromycin (ZITHROMAX) 500 mg in dextrose 5 % 250 mL IVPB  Status:  Discontinued     500 mg 250 mL/hr over 60 Minutes Intravenous Every 24 hours 11/25/16 1257 11/27/16 1033   11/26/16 1200  cefTRIAXone (ROCEPHIN) 1 g in dextrose 5 % 50 mL IVPB  Status:  Discontinued     1 g 100 mL/hr over 30 Minutes Intravenous Every 24 hours 11/25/16 1257 11/26/16 1357   11/25/16 1530  oseltamivir (TAMIFLU) capsule 30 mg     30 mg Oral 2 times daily 11/25/16 1525 11/30/16 0959   11/25/16 1445  cefTRIAXone (ROCEPHIN) 1 g in dextrose 5 % 50 mL IVPB  Status:  Discontinued     1 g 100 mL/hr over 30 Minutes Intravenous  Once 11/25/16 1433 11/25/16 1435   11/25/16 1445  azithromycin (ZITHROMAX) 500 mg in dextrose 5 % 250 mL IVPB  Status:  Discontinued     500 mg 250 mL/hr over 60 Minutes Intravenous  Once 11/25/16 1433 11/25/16 1436   11/25/16 1215  cefTRIAXone (ROCEPHIN) 1 g in dextrose 5 % 50 mL IVPB     1 g 100 mL/hr over 30 Minutes Intravenous  Once 11/25/16 1206 11/25/16 1348   11/25/16 1215  azithromycin (ZITHROMAX) 500 mg in dextrose 5 % 250 mL IVPB     500 mg 250 mL/hr over 60 Minutes Intravenous  Once 11/25/16 1206 11/25/16 1416      Medications: Scheduled Meds: . amLODipine  5 mg Oral QHS  . arformoterol  15 mcg Nebulization BID  . aspirin  81 mg Oral Daily  . budesonide (PULMICORT) nebulizer solution  0.5 mg Nebulization BID  . chlorhexidine   15 mL Mouth Rinse BID  . colesevelam  625 mg Oral BID WC  . enoxaparin (LOVENOX) injection  40 mg Subcutaneous Q24H  . fenofibrate  160 mg Oral Daily  . gabapentin  100 mg Oral TID  . loratadine  10 mg Oral Daily  . mouth rinse  15 mL Mouth Rinse q12n4p  . metoprolol succinate  50 mg Oral BID  . oseltamivir  30 mg Oral BID  . pantoprazole  40 mg Oral Daily  . pravastatin  40 mg Oral QHS  . [START ON 11/29/2016] prednisoLONE  40 mg Oral QAC breakfast  . raloxifene  60 mg Oral Daily  . sodium chloride flush  3 mL Intravenous Q12H  . tiotropium  18 mcg Inhalation Daily   Continuous Infusions: . cefTRIAXone (ROCEPHIN)  IV Stopped (11/28/16 1213)   PRN Meds:.acetaminophen, albuterol, ALPRAZolam, hydrALAZINE, HYDROcodone-homatropine, ondansetron **OR** ondansetron (ZOFRAN) IV    Objective: Weight change: 3 lb 14.4 oz (1.769 kg) No intake or output data in the 24 hours ending 11/28/16 1510 Blood pressure (!) 160/58, pulse 74, temperature 97.7 F (  36.5 C), temperature source Oral, resp. rate 18, height 5\' 1"  (1.549 m), weight 172 lb 3.2 oz (78.1 kg), SpO2 90 %. Temp:  [97.6 F (36.4 C)-98 F (36.7 C)] 97.7 F (36.5 C) (05/04 1443) Pulse Rate:  [72-107] 74 (05/04 1443) Resp:  [18-22] 18 (05/04 1443) BP: (145-186)/(58-83) 160/58 (05/04 1443) SpO2:  [90 %-95 %] 90 % (05/04 0826) Weight:  [172 lb 3.2 oz (78.1 kg)] 172 lb 3.2 oz (78.1 kg) (05/04 0500)  Physical Exam: General: Alert and awake, oriented x3, not in any acute distress. Walking hallway with PT Neuro: nonfocal  CBC:    BMET  Recent Labs  11/27/16 0613 11/28/16 0604  NA 138 140  K 3.2* 3.7  CL 101 103  CO2 28 28  GLUCOSE 99 139*  BUN 11 12  CREATININE 0.74 0.56  CALCIUM 8.0* 8.6*     Liver Panel   Recent Labs  11/26/16 0321  ALBUMIN 2.2*       Sedimentation Rate No results for input(s): ESRSEDRATE in the last 72 hours. C-Reactive Protein No results for input(s): CRP in the last 72  hours.  Micro Results: Recent Results (from the past 720 hour(s))  Blood Culture (routine x 2)     Status: Abnormal (Preliminary result)   Collection Time: 11/25/16 12:07 PM  Result Value Ref Range Status   Specimen Description BLOOD RIGHT ANTECUBITAL  Final   Special Requests   Final    BOTTLES DRAWN AEROBIC AND ANAEROBIC Blood Culture adequate volume   Culture  Setup Time   Final    ANAEROBIC BOTTLE ONLY GRAM POSITIVE COCCI IN CLUSTERS CRITICAL RESULT CALLED TO, READ BACK BY AND VERIFIED WITH: E WILLIAMSON 11/26/16 @ 57 M VESTAL    Culture (A)  Final    STAPHYLOCOCCUS HOMINIS SUSCEPTIBILITIES TO FOLLOW REPEATING SUSCEPTIBILITIES Performed at [facility], [address] [place], [place] [address]    Report Status PENDING  Incomplete  Blood Culture ID Panel (Reflexed)     Status: Abnormal   Collection Time: 11/25/16 12:07 PM  Result Value Ref Range Status   Enterococcus species NOT DETECTED NOT DETECTED Final   Listeria monocytogenes NOT DETECTED NOT DETECTED Final   Staphylococcus species DETECTED (A) NOT DETECTED Final    Comment: Methicillin (oxacillin) susceptible coagulase negative staphylococcus. Possible blood culture contaminant (unless isolated from more than one blood culture draw or clinical case suggests pathogenicity). No antibiotic treatment is indicated for blood  culture contaminants. CRITICAL RESULT CALLED TO, READ BACK BY AND VERIFIED WITH: E WILLIAMSON 11/26/16 @ 0093 M VESTAL    Staphylococcus aureus NOT DETECTED NOT DETECTED Final   Methicillin resistance NOT DETECTED NOT DETECTED Final   Streptococcus species NOT DETECTED NOT DETECTED Final   Streptococcus agalactiae NOT DETECTED NOT DETECTED Final   Streptococcus pneumoniae NOT DETECTED NOT DETECTED Final   Streptococcus pyogenes NOT DETECTED NOT DETECTED Final   Acinetobacter baumannii NOT DETECTED NOT DETECTED Final   Enterobacteriaceae species NOT DETECTED NOT DETECTED Final   Enterobacter  cloacae complex NOT DETECTED NOT DETECTED Final   Escherichia coli NOT DETECTED NOT DETECTED Final   Klebsiella oxytoca NOT DETECTED NOT DETECTED Final   Klebsiella pneumoniae NOT DETECTED NOT DETECTED Final   Proteus species NOT DETECTED NOT DETECTED Final   Serratia marcescens NOT DETECTED NOT DETECTED Final   Haemophilus influenzae NOT DETECTED NOT DETECTED Final   Neisseria meningitidis NOT DETECTED NOT DETECTED Final   Pseudomonas aeruginosa NOT DETECTED NOT DETECTED Final   Candida  albicans NOT DETECTED NOT DETECTED Final   Candida glabrata NOT DETECTED NOT DETECTED Final   Candida krusei NOT DETECTED NOT DETECTED Final   Candida parapsilosis NOT DETECTED NOT DETECTED Final   Candida tropicalis NOT DETECTED NOT DETECTED Final    Comment: Performed at [facility], [place] [address], [place], [place] [address]  Blood Culture (routine x 2)     Status: Abnormal (Preliminary result)   Collection Time: 11/25/16 12:12 PM  Result Value Ref Range Status   Specimen Description BLOOD RIGHT HAND  Final   Special Requests   Final    BOTTLES DRAWN AEROBIC AND ANAEROBIC Blood Culture adequate volume   Culture  Setup Time   Final    GRAM POSITIVE COCCI IN CLUSTERS AEROBIC BOTTLE ONLY CRITICAL VALUE NOTED.  VALUE IS CONSISTENT WITH PREVIOUSLY REPORTED AND CALLED VALUE.    Culture (A)  Final    STAPHYLOCOCCUS HOMINIS SUSCEPTIBILITIES TO FOLLOW Performed at [facility], [place] [address], [place], [place] [address]    Report Status PENDING  Incomplete  MRSA PCR Screening     Status: None   Collection Time: 11/25/16  2:43 PM  Result Value Ref Range Status   MRSA by PCR NEGATIVE NEGATIVE Final    Comment:        The GeneXpert MRSA Assay (FDA approved for NASAL specimens only), is one component of a comprehensive MRSA colonization surveillance program. It is not intended to diagnose MRSA infection nor to guide or monitor treatment for MRSA infections.   Culture, blood (x 2)      Status: None (Preliminary result)   Collection Time: 11/25/16  2:56 PM  Result Value Ref Range Status   Specimen Description BLOOD LEFT ARM  Final   Special Requests AEROBIC BOTTLE ONLY Blood Culture adequate volume  Final   Culture   Final    NO GROWTH 3 DAYS Performed at [facility], [address], [place], [place] [address]    Report Status PENDING  Incomplete  Culture, blood (x 2)     Status: None (Preliminary result)   Collection Time: 11/25/16  2:58 PM  Result Value Ref Range Status   Specimen Description BLOOD RIGHT HAND  Final   Special Requests   Final    BOTTLES DRAWN AEROBIC AND ANAEROBIC Blood Culture adequate volume   Culture   Final    NO GROWTH 3 DAYS Performed at [facility], [place] [address], [place], [place] [address]    Report Status PENDING  Incomplete  Culture, sputum-assessment     Status: None   Collection Time: 11/25/16  3:32 PM  Result Value Ref Range Status   Specimen Description EXPECTORATED SPUTUM  Final   Special Requests NONE  Final   Sputum evaluation THIS SPECIMEN IS ACCEPTABLE FOR SPUTUM CULTURE  Final   Report Status 11/25/2016 FINAL  Final  Culture, respiratory (NON-Expectorated)     Status: None   Collection Time: 11/25/16  3:32 PM  Result Value Ref Range Status   Specimen Description EXPECTORATED SPUTUM  Final   Special Requests NONE Reflexed from [id number]  Final   Gram Stain   Final    ABUNDANT WBC PRESENT, PREDOMINANTLY PMN ABUNDANT GRAM NEGATIVE RODS ABUNDANT GRAM POSITIVE COCCI IN PAIRS FEW GRAM POSITIVE RODS FEW YEAST MODERATE SQUAMOUS EPITHELIAL CELLS PRESENT    Culture   Final    Consistent with normal respiratory flora. Performed at [facility], [place],  [place] [address]    Report Status 11/28/2016 FINAL  Final    Studies/Results: No results found.    Assessment/Plan:  INTERVAL HISTORY: Staph hominis from blood cultures    Active Problems:   Community acquired pneumonia   Acute  respiratory failure with hypoxia (HCC)   Influenza B   Coagulase negative Staphylococcus bacteremia   Sepsis due to Streptococcus pneumoniae ([place])    Jobeth Pangilinan is a 76 y.o. female with  Hx of COPD admitted with CAP after URI found to have influenza B, Coagulase negative Staphylococcal bacteremia and pneumococcal antigen + in urine  #1 MS  Staphylococcus hominis bacteremia:  I doubt endocarditis with this organism. I suspect there was secondary  infection after influenza B  Would give her 2 weeks of Ceftriaxone  Would then check surveillance blood cultures 2+ weeks AFTER finishing IV abx  #2 Pneumococal ag: will be covered for pneumococcus with 2 weeks of CTX  #3 Flu B: 5 days of tamiflu  PRESUMING HER ORGANISM SHOWS ITSELF T BE S TO OXACILLIN (S NOT BACK YET) Plan is for CTX as below  Diagnosis:  MS-coag negative staphylococcal bacteremia and pneumonia  Culture Result: Staph hominis  Allergies  Allergen Reactions  . Prednisone Other (See Comments)    Leg pain,light headed    Discharge antibiotics: Ceftriaxone 2 grams daily   Duration: 2 weeks  End Date:  12/09/16  [facility] Care Per Protocol:  Labs weekly while on IV antibiotics: _x_ CBC with differential _x_ BMP  x__ Please pull PIC at completion of IV antibiotics __ Please leave PIC in place until doctor has seen patient or been notified  Fax weekly labs to [phone]  Clinic Follow Up Appt:   Followup with PCP and then get surveillance blood cultures in June    LOS: 3 days   Alcide Evener 11/28/2016, 3:10 PM

## 2016-11-29 LAB — CULTURE, BLOOD (ROUTINE X 2)
SPECIAL REQUESTS: ADEQUATE
Special Requests: ADEQUATE

## 2016-11-29 LAB — PROCALCITONIN: PROCALCITONIN: 0.35 ng/mL

## 2016-11-29 LAB — GLUCOSE, CAPILLARY: GLUCOSE-CAPILLARY: 88 mg/dL (ref 65–99)

## 2016-11-29 MED ORDER — VANCOMYCIN HCL IN DEXTROSE 750-5 MG/150ML-% IV SOLN
750.0000 mg | Freq: Two times a day (BID) | INTRAVENOUS | Status: DC
  Administered 2016-11-29: 750 mg via INTRAVENOUS
  Filled 2016-11-29: qty 150

## 2016-11-29 MED ORDER — VANCOMYCIN IV (FOR PTA / DISCHARGE USE ONLY): 750.0000 mg | Freq: Two times a day (BID) | INTRAVENOUS | 0 refills | Status: AC

## 2016-11-29 MED ORDER — AMLODIPINE BESYLATE 10 MG PO TABS
10.0000 mg | ORAL_TABLET | Freq: Every day | ORAL | 0 refills | Status: DC
Start: 2016-11-29 — End: 2016-12-08

## 2016-11-29 MED ORDER — HYDROCODONE-HOMATROPINE 5-1.5 MG/5ML PO SYRP: 5.0000 mL | ORAL_SOLUTION | ORAL | 0 refills | Status: DC | PRN

## 2016-11-29 MED ORDER — OSELTAMIVIR PHOSPHATE 30 MG PO CAPS: 30.0000 mg | ORAL_CAPSULE | Freq: Two times a day (BID) | ORAL | 0 refills | Status: DC

## 2016-11-29 MED ORDER — PREDNISOLONE 5 MG PO TABS: 40.0000 mg | ORAL_TABLET | Freq: Every day | ORAL | 0 refills | Status: AC

## 2016-11-29 MED ORDER — HEPARIN SOD (PORK) LOCK FLUSH 100 UNIT/ML IV SOLN
250.0000 [IU] | INTRAVENOUS | Status: AC | PRN
  Administered 2016-11-29: 250 [IU]

## 2016-11-29 NOTE — Progress Notes (Signed)
Pharmacy Antibiotic Note  Courtney Brown is a 76 y.o. female presented to the ED on 11/25/2016 with c/o SOB and cough.  Patient was then found to have influenza, pneumococcal PNA, and staph bacteremia and ceftriaxone started on 5/1.  TTE 5/3 neg for vegetation. BCID showed methicillin sensitive CoNS but the final susceptibilities on 5/5 revealed methicillin resistant coag negative staph. Per ID, to change abx to vancomycin with plan to treat for 9 more days   Plan: - vancomycin 750 mg IV q12h - will check level at steady state - f/u renal function _________________________________  Height: 5\' 1"  (154.9 cm) Weight: 172 lb 4.8 oz (78.2 kg) IBW/kg (Calculated) : 47.8  Temp (24hrs), Avg:97.7 F (36.5 C), Min:97.7 F (36.5 C), Max:97.7 F (36.5 C)   Recent Labs Lab 11/25/16 1225 11/25/16 1232 11/25/16 1456 11/25/16 1743 11/25/16 2009 11/25/16 2259 11/26/16 0321 11/27/16 0613 11/28/16 0604  WBC 13.5*  --  14.3*  --   --   --  12.7* 9.6  --   CREATININE 1.01*  --  0.98  --   --   --  0.81 0.74 0.56  LATICACIDVEN  --  2.29* 2.6* 3.5* 1.8 1.5  --   --   --     Estimated Creatinine Clearance: 57.6 mL/min (by C-G formula based on SCr of 0.56 mg/dL).    Allergies  Allergen Reactions  . Prednisone Other (See Comments)    Leg pain,light headed    Antimicrobials this admission:  5/1 azithro>>5/2 5/1 CTX>> 5/5 5/5 vanc>>  Dose adjustments this admission:  --  Microbiology results:  5/1 sputum: few yeast, GNR, GP cocci in pairs FINAL 5/1 BCx x2 at 1200: 2/2 CoNS (oxacillin resistant) 5/01 bcx x2 at 1458:  5/1 MRSA PCR:  neg 5/1 strep Uag - positive Flu B positive HIV neg  Thank you for allowing pharmacy to be a part of this patient's care.  Lynelle Doctor 11/29/2016 10:23 AM

## 2016-11-29 NOTE — Progress Notes (Signed)
Patient ID: Courtney Brown, female   DOB: 03-21-1941, 76 y.o.   MRN: [id number]          [facility]    Date of Admission:  11/25/2016    Day 5 ceftriaxone        Day 5 oseltamivir  [name] has pneumonia due to influenza B and pneumococcus as well as coag negative staph bacteremia. The BCID showed the coag negative staph as methicillin sensitive but the final susceptibilities revealed methicillin resistant coag negative staph. Even though she is afebrile now I will change ceftriaxone to vancomycin and recommend 9 more days of therapy. She completes oseltamivir therapy today. Please call if we can be of further assistance.        Michel Bickers, MD [facility] [phone] pager   [phone] cell 11/29/2016, 10:10 AM

## 2016-11-29 NOTE — Progress Notes (Signed)
PHARMACY CONSULT NOTE FOR:  OUTPATIENT  PARENTERAL ANTIBIOTIC THERAPY (OPAT)  Indication: bacteremia Regimen: vancomycin 750 mg IV q12h End date: 12/08/16  IV antibiotic discharge orders are pended. To discharging provider:  please sign these orders via discharge navigator,  Select New Orders & click on the button choice - Manage This Unsigned Work.     Thank you for allowing pharmacy to be a part of this patient's care.  Lynelle Doctor 11/29/2016, 10:51 AM

## 2016-11-29 NOTE — Progress Notes (Signed)
SATURATION QUALIFICATIONS: (This note is used to comply with regulatory documentation for home oxygen)  Patient Saturations on Room Air at Rest = 93%  Patient Saturations on Room Air while Ambulating = 92%  Patient Saturations on 0 Liters of oxygen while Ambulating = 92%  Please briefly explain why patient needs home oxygen:

## 2016-11-29 NOTE — Progress Notes (Signed)
Notified Jermaine at [facility] that pt is going home today.

## 2016-11-29 NOTE — Progress Notes (Signed)
Waiting for ride 

## 2016-11-29 NOTE — Discharge Summary (Addendum)
Physician Discharge Summary  Courtney Brown  IOE:[id number]  DOB: May 16, 1941  DOA: 11/25/2016 PCP: Flossie Buffy, NP  Admit date: 11/25/2016 Discharge date: 11/29/2016  Admitted From: Home  Disposition:  Home   Recommendations for Outpatient Follow-up:  1. Follow up with PCP in 1 week 2. Please obtain BMP/CBC in one week to monitor Hgb and Cr  3. Continue IV vancomycin for 9 days - repeat blood cultures after abx completed and remove PICC line once blood cultures are negative > 48 hrs 4. Repeat CXR in 3-4 weeks to assure resolution of PNA.  5. Follow up with Infectious Disease Dr Tommy Medal   Home Health: Infusion nurse, PT recommended HH PT but patient decline  Equipment/Devices: PICC line R arm   Discharge Condition: Improved  CODE STATUS: FULL  Diet recommendation: Heart Healthy   Brief/Interim Summary: Courtney Brown is a 76 y.o. female with past medical history of COPD, hypertension, MI in 2008, and hyperlipidemia presented to the emergency department complaining of shortness of breath, weak and tired associated with productive cough with fevers and chills the past 2 weeks. Patient was admitted with a chest x-ray concerning for pneumonia in the right side, sepsis and positive influenza. Patient was started on empiric antibiotic, tamiflu and IVF. Patient was found to be bacteremic with staph hominis and strep antigen positive. ID consulted recommended 2 weeks of abx. PICC line was placed and patient was discharged on IV vancomycin.   Subjective: Patient seen and examined, doing much better. Breathing has improved, she is of O2 now. Remains afebrile, cough has diminished. Patient tolerating diet well and ambulating with no issues.   Discharge Diagnoses/Hospital Course:  Acute respiratory failure due to Sepsis secondary to influenza pneumonia with superimposed strep pneumo.  Sepsis physiology resolved. Respiratory symptoms improved - Ambulatory O2 > 92% on RA  Chest x-ray on admission  showed right midlung and left base consistent with pneumonia. Influenza B+, Strep pneumoniae urine antigen +  Patient treated with empiric antibiotics ceftriaxone and azithromycin and Tamiflu Completed course of azithromycin, She was treated with CTX for 5 days - then changed to Vancomycin to complete 9 days  Completed 5 days of Tamiflu  Prednisolone x 5 day  Continue Hycodan, Spiriva and Symbicort  No O2 needed ambulated well without needing of O2   Bacteremia Staph Hominis - no risk factors  First set 11/25/16 of blood culture 2 bottles positive for staph species Second set of blood cultures done the same 11/25/16 day but 4 hour later shows NG Will discharge on Vanco for total of 9 days, repeat blood Cx after treatment is completed  PICC place on R arm, Home health services arranged for IV infusion  TTE done show no vegetation but unlikely that this organism causes endocarditis   AKI  - 2/2 sepsis - resolved  Initial Cr 1.01 - treated with IVF  Check BMP in 1 week   Hypokalemia - resolved  Repleted  Hypertension - BP was not at goal during hospital stay  BP medication where held on admission due to low BP  Dose of Norvasc was increased - BP better upon discharge  Continue Metoprolol 50 mg BID  Monitor BP - follow up with PCP in 1 week   All other chronic medical condition were stable during the hospitalization.  Patient was seen by physical therapy, recommended HH PT but patient refuses services  On the day of the discharge the patient's vitals were stable, and no other acute medical condition were  reported by patient. Patient was felt safe to be discharge to home with daughter as care giver.   Discharge Instructions  You were cared for by a hospitalist during your hospital stay. If you have any questions about your discharge medications or the care you received while you were in the hospital after you are discharged, you can call the unit and asked to speak with the hospitalist  on call if the hospitalist that took care of you is not available. Once you are discharged, your primary care physician will handle any further medical issues. Please note that NO REFILLS for any discharge medications will be authorized once you are discharged, as it is imperative that you return to your primary care physician (or establish a relationship with a primary care physician if you do not have one) for your aftercare needs so that they can reassess your need for medications and monitor your lab values.  Discharge Instructions    Call MD for:  difficulty breathing, headache or visual disturbances    Complete by:  As directed    Call MD for:  extreme fatigue    Complete by:  As directed    Call MD for:  hives    Complete by:  As directed    Call MD for:  persistant dizziness or light-headedness    Complete by:  As directed    Call MD for:  persistant nausea and vomiting    Complete by:  As directed    Call MD for:  redness, tenderness, or signs of infection (pain, swelling, redness, odor or green/yellow discharge around incision site)    Complete by:  As directed    Call MD for:  severe uncontrolled pain    Complete by:  As directed    Call MD for:  temperature >100.4    Complete by:  As directed    Diet - low sodium heart healthy    Complete by:  As directed    Home infusion instructions [place] May follow [place] Dosing Protocol; May administer Cathflo as needed to maintain patency of vascular access device.; Flushing of vascular access device: per [facility] Protocol: 0.9% NaCl pre/post medica...    Complete by:  As directed    Instructions:  May follow [place] Dosing Protocol   Instructions:  May administer Cathflo as needed to maintain patency of vascular access device.   Instructions:  Flushing of vascular access device: per [facility] Protocol: 0.9% NaCl pre/post medication administration and prn patency; Heparin 100 u/ml, 74ml for implanted ports and Heparin 10u/ml, 74ml  for all other central venous catheters.   Instructions:  May follow [facility] Anaphylaxis Protocol for First Dose Administration in the home: 0.9% NaCl at 25-50 ml/hr to maintain IV access for protocol meds. Epinephrine 0.3 ml IV/IM PRN and Benadryl 25-50 IV/IM PRN s/s of anaphylaxis.   Instructions:  [facility] Infusion Coordinator (RN) to assist per patient IV care needs in the home PRN.   Increase activity slowly    Complete by:  As directed      Allergies as of 11/29/2016      Reactions   Prednisone Other (See Comments)   Leg pain,light headed      Medication List    STOP taking these medications   dextromethorphan-guaiFENesin 30-600 MG 12hr tablet Commonly known as:  [place] DM     TAKE these medications   acetaminophen 500 MG tablet Commonly known as:  TYLENOL Take 500 mg by mouth every  6 (six) hours as needed.   albuterol 108 (90 Base) MCG/ACT inhaler Commonly known as:  PROVENTIL HFA;VENTOLIN HFA Inhale 1-2 puffs into the lungs every 6 (six) hours as needed for wheezing or shortness of breath. 2 puff by mouth every 4 hours PRN   ALPRAZolam 0.25 MG tablet Commonly known as:  XANAX TAKE 1 TABLET AT BEDTIME AS NEEDED FOR ANXIETY   amLODipine 10 MG tablet Commonly known as:  NORVASC Take 1 tablet (10 mg total) by mouth at bedtime. Must keep 11/03/16 appt for future refills What changed:  medication strength  how much to take   aspirin 81 MG tablet Take 81 mg by mouth daily.   diphenoxylate-atropine 2.5-0.025 MG tablet Commonly known as:  LOMOTIL Take by mouth 4 (four) times daily as needed for diarrhea or loose stools.   fenofibrate 145 MG tablet Commonly known as:  TRICOR Take 1 tablet (145 mg total) by mouth daily.   gabapentin 100 MG capsule Commonly known as:  NEURONTIN Take 1 capsule (100 mg total) by mouth 3 (three) times daily. For shingle   Garlic Oil 1540 MG Caps Take 1,000 mg by mouth 1 day or 1 dose.   HYDROcodone-homatropine 5-1.5 MG/5ML  syrup Commonly known as:  HYCODAN Take 5 mLs by mouth every 4 (four) hours as needed for cough.   IRON-C PO Take 65 mg by mouth 1 day or 1 dose.   Knee Compression Sleeve/L/XL Misc 1 Units by Does not apply route daily.   loratadine 10 MG tablet Commonly known as:  CLARITIN Take 10 mg by mouth daily.   lovastatin 40 MG tablet Commonly known as:  MEVACOR Take 1 tablet (40 mg total) by mouth at bedtime. Must keep 11/03/16 appt for future refills   metoprolol succinate 50 MG 24 hr tablet Commonly known as:  TOPROL-XL Take 1 tablet (50 mg total) by mouth 2 (two) times daily.   omeprazole 20 MG capsule Commonly known as:  PRILOSEC Take 1 capsule (20 mg total) by mouth daily.   ondansetron 4 MG tablet Commonly known as:  ZOFRAN Take 4 mg by mouth every 8 (eight) hours as needed for nausea or vomiting.   oseltamivir 30 MG capsule Commonly known as:  TAMIFLU Take 1 capsule (30 mg total) by mouth 2 (two) times daily.   prednisoLONE 5 MG Tabs tablet Take 8 tablets (40 mg total) by mouth daily before breakfast. Start taking on:  11/30/2016   PROBIOTIC ACIDOPHILUS Caps Take by mouth once.   raloxifene 60 MG tablet Commonly known as:  EVISTA Take 1 tablet (60 mg total) by mouth daily.   SYMBICORT 160-4.5 MCG/ACT inhaler Generic drug:  budesonide-formoterol Inhale 2 puffs into the lungs 2 (two) times daily. Inhale 1-2 puffs twice a day   tiotropium 18 MCG inhalation capsule Commonly known as:  SPIRIVA Place 1 capsule (18 mcg total) into inhaler and inhale daily.   vancomycin IVPB Inject 750 mg into the vein every 12 (twelve) hours. Indication:  Staph bacteremia Last Day of Therapy:  12/08/16 Labs - Monday:  CBC/D, BMP, and vancomycin trough.   WELCHOL 625 MG tablet Generic drug:  colesevelam Take 1 tablet (625 mg total) by mouth 2 (two) times daily with a meal.            Home Infusion Instuctions        Start     Ordered   11/29/16 0000  Home infusion instructions  [facility] May follow [place] Dosing Protocol;  May administer Cathflo as needed to maintain patency of vascular access device.; Flushing of vascular access device: per [facility] Protocol: 0.9% NaCl pre/post medica...    Question Answer Comment  Instructions May follow [place] Dosing Protocol   Instructions May administer Cathflo as needed to maintain patency of vascular access device.   Instructions Flushing of vascular access device: per [facility] Protocol: 0.9% NaCl pre/post medication administration and prn patency; Heparin 100 u/ml, 34ml for implanted ports and Heparin 10u/ml, 87ml for all other central venous catheters.   Instructions May follow [facility] Anaphylaxis Protocol for First Dose Administration in the home: 0.9% NaCl at 25-50 ml/hr to maintain IV access for protocol meds. Epinephrine 0.3 ml IV/IM PRN and Benadryl 25-50 IV/IM PRN s/s of anaphylaxis.   Instructions [facility] Infusion Coordinator (RN) to assist per patient IV care needs in the home PRN.      11/29/16 1349     Follow-up Information    [facility], [facility]-Home Follow up.   Why:  HH nursing-instruction, & IV infusion Contact information: [address] [place] [address] [phone]        Tommy Medal, Lavell Islam, MD. Schedule an appointment as soon as possible for a visit in 2 week(s).   Specialty:  Infectious Diseases Why:  Hospital follow up  Contact information: [address] [address] [phone]        Nche, Charlene Brooke, NP. Schedule an appointment as soon as possible for a visit in 1 week(s).   Specialty:  Internal Medicine Contact information: [address] [place] [address] [phone]          Allergies  Allergen Reactions  . Prednisone Other (See Comments)    Leg pain,light headed    Consultations:  ID    Procedures/Studies: Dg Bone Density  Result Date: 11/19/2016 Date of study: 11/17/2016 Exam: DUAL X-RAY ABSORPTIOMETRY  (DXA) FOR BONE MINERAL DENSITY (BMD) Instrument: [employer] Provider: PCP Indication: follow up for osteoporosis Comparison: none (please note that it is not possible to compare data from different instruments) Clinical data: Pt is a 76 y.o. female without previous history of fracture. On raloxifene. Results:  Lumbar spine L1-L4 (L3) Femoral neck (FN) T-score +0.1  RFN:-1.1 LFN: -0.7  Assessment: the BMD is low according to the [facility] classification for osteoporosis (see below). Fracture risk: moderate FRAX score: Not calculated as patient is on treatment Comments: the technical quality of the study is good, however, L3 vertebra had to be excluded from analysis due to degenerative changes. Evaluation for secondary causes should be considered if clinically indicated. Recommend optimizing calcium (1200 mg/day) and vitamin D (800 IU/day) intake. Followup: Repeat BMD is appropriate after 2 years WHO criteria for diagnosis of osteoporosis in postmenopausal women and in men 43 y/o or older: - normal: T-score -1.0 to + 1.0 - osteopenia/low bone density: T-score between -2.5 and -1.0 - osteoporosis: T-score below -2.5 - severe osteoporosis: T-score below -2.5 with history of fragility fracture Note: although not part of the WHO classification, the presence of a fragility fracture, regardless of the T-score, should be considered diagnostic of osteoporosis, provided other causes for the fracture have been excluded. Treatment: The National Osteoporosis Foundation recommends that treatment be considered in postmenopausal women and men age 38 or older with: 1. Hip or vertebral (clinical or morphometric) fracture 2. T-score of - 2.5 or lower at the spine or hip 3. 10-year fracture probability by FRAX of at least 20%  for a major osteoporotic fracture and 3% for a hip fracture Philemon Kingdom, MD [facility]   Dg Chest Port 1 View  Result Date: 11/25/2016 CLINICAL DATA:  Cough with shortness of breath  EXAM: PORTABLE CHEST 1 VIEW COMPARISON:  August 08, 2016 FINDINGS: There is focal airspace consolidation in the periphery of the right mid lung. There is also subtle infiltrate in the left base. Lungs elsewhere clear. Heart is mildly enlarged with pulmonary vascularity within normal limits. No adenopathy. There is aortic atherosclerosis. No bone lesions. IMPRESSION: Airspace consolidation in the right mid lung and to a lesser extent in the left base consistent with pneumonia. Lungs elsewhere clear. Stable cardiac prominence. There is aortic atherosclerosis. Followup PA and lateral chest radiographs recommended in 3-4 weeks following trial of antibiotic therapy to ensure resolution and exclude underlying malignancy. Electronically Signed   By: Lowella Grip III M.D.   On: 11/25/2016 12:43   ECHO 11/27/2016 ------------------------------------------------------------------- Study Conclusions  - Left ventricle: The cavity size was normal. Wall thickness was   increased in a pattern of mild LVH. Systolic function was normal.   The estimated ejection fraction was in the range of 60% to 65%.   Doppler parameters are consistent with abnormal left ventricular   relaxation (grade 1 diastolic dysfunction). - Pericardium, extracardiac: A trivial pericardial effusion was   identified.  Impressions:  - Difficult acoustic windows   Discharge Exam: Vitals:   11/29/16 0622 11/29/16 0820  BP: (!) 152/60 (!) 142/58  Pulse: 71 82  Resp: 20   Temp: 97.7 F (36.5 C)    Vitals:   11/28/16 2053 11/29/16 0622 11/29/16 0820 11/29/16 0845  BP: (!) 153/60 (!) 152/60 (!) 142/58   Pulse: 73 71 82   Resp: 18 20    Temp: 97.7 F (36.5 C) 97.7 F (36.5 C)    TempSrc: Oral Oral    SpO2: 94% 93%  92%  Weight:  78.2 kg (172 lb 4.8 oz)    Height:        General: Pt is alert, awake, not in acute distress Cardiovascular: RRR, S1/S2 +, no rubs, no gallops Respiratory: Good air entry, mild rhonchi diffuse,  no wheezing Abdominal: Soft, NT, ND, bowel sounds + Extremities: no edema, no cyanosis   The results of significant diagnostics from this hospitalization (including imaging, microbiology, ancillary and laboratory) are listed below for reference.     Microbiology: Recent Results (from the past 240 hour(s))  Blood Culture (routine x 2)     Status: Abnormal   Collection Time: 11/25/16 12:07 PM  Result Value Ref Range Status   Specimen Description BLOOD RIGHT ANTECUBITAL  Final   Special Requests   Final    BOTTLES DRAWN AEROBIC AND ANAEROBIC Blood Culture adequate volume   Culture  Setup Time   Final    ANAEROBIC BOTTLE ONLY GRAM POSITIVE COCCI IN CLUSTERS CRITICAL RESULT CALLED TO, READ BACK BY AND VERIFIED WITH: E WILLIAMSON 11/26/16 @ 9924 M VESTAL Performed at [facility], [address] [place], [place] [address]    Culture STAPHYLOCOCCUS SPECIES (COAGULASE NEGATIVE) (A)  Final   Report Status 11/29/2016 FINAL  Final   Organism ID, Bacteria STAPHYLOCOCCUS SPECIES (COAGULASE NEGATIVE)  Final      Susceptibility   Staphylococcus species (coagulase negative) - MIC*    CIPROFLOXACIN 1 SENSITIVE Sensitive     ERYTHROMYCIN >=8 RESISTANT Resistant     GENTAMICIN <=0.5 SENSITIVE Sensitive     OXACILLIN RESISTANT Resistant  TETRACYCLINE 2 SENSITIVE Sensitive     VANCOMYCIN 1 SENSITIVE Sensitive     TRIMETH/SULFA <=10 SENSITIVE Sensitive     CLINDAMYCIN <=0.25 SENSITIVE Sensitive     RIFAMPIN <=0.5 SENSITIVE Sensitive     Inducible Clindamycin NEGATIVE Sensitive     * STAPHYLOCOCCUS SPECIES (COAGULASE NEGATIVE)  Blood Culture ID Panel (Reflexed)     Status: Abnormal   Collection Time: 11/25/16 12:07 PM  Result Value Ref Range Status   Enterococcus species NOT DETECTED NOT DETECTED Final   Listeria monocytogenes NOT DETECTED NOT DETECTED Final   Staphylococcus species DETECTED (A) NOT DETECTED Final    Comment: Methicillin (oxacillin) susceptible coagulase negative  staphylococcus. Possible blood culture contaminant (unless isolated from more than one blood culture draw or clinical case suggests pathogenicity). No antibiotic treatment is indicated for blood  culture contaminants. CRITICAL RESULT CALLED TO, READ BACK BY AND VERIFIED WITH: E WILLIAMSON 11/26/16 @ 8144 M VESTAL    Staphylococcus aureus NOT DETECTED NOT DETECTED Final   Methicillin resistance NOT DETECTED NOT DETECTED Final   Streptococcus species NOT DETECTED NOT DETECTED Final   Streptococcus agalactiae NOT DETECTED NOT DETECTED Final   Streptococcus pneumoniae NOT DETECTED NOT DETECTED Final   Streptococcus pyogenes NOT DETECTED NOT DETECTED Final   Acinetobacter baumannii NOT DETECTED NOT DETECTED Final   Enterobacteriaceae species NOT DETECTED NOT DETECTED Final   Enterobacter cloacae complex NOT DETECTED NOT DETECTED Final   Escherichia coli NOT DETECTED NOT DETECTED Final   Klebsiella oxytoca NOT DETECTED NOT DETECTED Final   Klebsiella pneumoniae NOT DETECTED NOT DETECTED Final   Proteus species NOT DETECTED NOT DETECTED Final   Serratia marcescens NOT DETECTED NOT DETECTED Final   Haemophilus influenzae NOT DETECTED NOT DETECTED Final   Neisseria meningitidis NOT DETECTED NOT DETECTED Final   Pseudomonas aeruginosa NOT DETECTED NOT DETECTED Final   Candida albicans NOT DETECTED NOT DETECTED Final   Candida glabrata NOT DETECTED NOT DETECTED Final   Candida krusei NOT DETECTED NOT DETECTED Final   Candida parapsilosis NOT DETECTED NOT DETECTED Final   Candida tropicalis NOT DETECTED NOT DETECTED Final    Comment: Performed at [facility], [place] [address] [place], [place] [address]  Blood Culture (routine x 2)     Status: Abnormal   Collection Time: 11/25/16 12:12 PM  Result Value Ref Range Status   Specimen Description BLOOD RIGHT HAND  Final   Special Requests   Final    BOTTLES DRAWN AEROBIC AND ANAEROBIC Blood Culture adequate volume   Culture  Setup Time   Final     GRAM POSITIVE COCCI IN CLUSTERS AEROBIC BOTTLE ONLY CRITICAL VALUE NOTED.  VALUE IS CONSISTENT WITH PREVIOUSLY REPORTED AND CALLED VALUE.    Culture (A)  Final    STAPHYLOCOCCUS SPECIES (COAGULASE NEGATIVE) SUSCEPTIBILITIES PERFORMED ON PREVIOUS CULTURE WITHIN THE LAST 5 DAYS. Performed at [facility], [place] [address] [place], [place] [address]    Report Status 11/29/2016 FINAL  Final  MRSA PCR Screening     Status: None   Collection Time: 11/25/16  2:43 PM  Result Value Ref Range Status   MRSA by PCR NEGATIVE NEGATIVE Final    Comment:        The GeneXpert MRSA Assay (FDA approved for NASAL specimens only), is one component of a comprehensive MRSA colonization surveillance program. It is not intended to diagnose MRSA infection nor to guide or monitor treatment for MRSA infections.   Culture, blood (x 2)  Status: None (Preliminary result)   Collection Time: 11/25/16  2:56 PM  Result Value Ref Range Status   Specimen Description BLOOD LEFT ARM  Final   Special Requests AEROBIC BOTTLE ONLY Blood Culture adequate volume  Final   Culture   Final    NO GROWTH 4 DAYS Performed at [facility], [address], [place], [place] [address]    Report Status PENDING  Incomplete  Culture, blood (x 2)     Status: None (Preliminary result)   Collection Time: 11/25/16  2:58 PM  Result Value Ref Range Status   Specimen Description BLOOD RIGHT HAND  Final   Special Requests   Final    BOTTLES DRAWN AEROBIC AND ANAEROBIC Blood Culture adequate volume   Culture   Final    NO GROWTH 4 DAYS Performed at [facility], [place] [address] [place], [place] [address]    Report Status PENDING  Incomplete  Culture, sputum-assessment     Status: None   Collection Time: 11/25/16  3:32 PM  Result Value Ref Range Status   Specimen Description EXPECTORATED SPUTUM  Final   Special Requests NONE  Final   Sputum evaluation THIS SPECIMEN IS ACCEPTABLE FOR SPUTUM CULTURE  Final    Report Status 11/25/2016 FINAL  Final  Culture, respiratory (NON-Expectorated)     Status: None   Collection Time: 11/25/16  3:32 PM  Result Value Ref Range Status   Specimen Description EXPECTORATED SPUTUM  Final   Special Requests NONE Reflexed from [id number]  Final   Gram Stain   Final    ABUNDANT WBC PRESENT, PREDOMINANTLY PMN ABUNDANT GRAM NEGATIVE RODS ABUNDANT GRAM POSITIVE COCCI IN PAIRS FEW GRAM POSITIVE RODS FEW YEAST MODERATE SQUAMOUS EPITHELIAL CELLS PRESENT    Culture   Final    Consistent with normal respiratory flora. Performed at [facility], [place] [address], [place], [place] [address]    Report Status 11/28/2016 FINAL  Final     Labs: BNP (last 3 results) No results for input(s): BNP in the last 8760 hours. Basic Metabolic Panel:  Recent Labs Lab 11/25/16 1225 11/25/16 1456 11/26/16 0321 11/27/16 0613 11/28/16 0604  NA 137  --  141 138 140  K 3.0*  --  3.7 3.2* 3.7  CL 98*  --  106 101 103  CO2 25  --  28 28 28   GLUCOSE 156*  --  97 99 139*  BUN 12  --  12 11 12   CREATININE 1.01* 0.98 0.81 0.74 0.56  CALCIUM 8.3*  --  7.3* 8.0* 8.6*   Liver Function Tests:  Recent Labs Lab 11/25/16 1225 11/26/16 0321  AST 23  --   ALT 19  --   ALKPHOS 33*  --   BILITOT 0.8  --   PROT 7.2  --   ALBUMIN 3.1* 2.2*   No results for input(s): LIPASE, AMYLASE in the last 168 hours. No results for input(s): AMMONIA in the last 168 hours. CBC:  Recent Labs Lab 11/25/16 1225 11/25/16 1456 11/26/16 0321 11/27/16 0613  WBC 13.5* 14.3* 12.7* 9.6  NEUTROABS 11.2*  --   --  6.6  HGB 15.8* 13.0 11.8* 12.3  HCT 48.2* 40.6 37.2 38.0  MCV 92.9 92.3 92.5 91.8  PLT 204 185 189 261   Cardiac Enzymes: No results for input(s): CKTOTAL, CKMB, CKMBINDEX, TROPONINI in the last 168 hours. BNP: Invalid input(s): POCBNP CBG:  Recent Labs Lab 11/26/16 0738 11/27/16 0732 11/28/16 0748 11/29/16  0749  GLUCAP 101* 103* 127* 88   D-Dimer No results for input(s):  DDIMER in the last 72 hours. Hgb A1c No results for input(s): HGBA1C in the last 72 hours. Lipid Profile No results for input(s): CHOL, HDL, LDLCALC, TRIG, CHOLHDL, LDLDIRECT in the last 72 hours. Thyroid function studies No results for input(s): TSH, T4TOTAL, T3FREE, THYROIDAB in the last 72 hours.  Invalid input(s): FREET3 Anemia work up No results for input(s): VITAMINB12, FOLATE, FERRITIN, TIBC, IRON, RETICCTPCT in the last 72 hours. Urinalysis    Component Value Date/Time   COLORURINE YELLOW 11/25/2016 2253   APPEARANCEUR CLEAR 11/25/2016 2253   LABSPEC 1.009 11/25/2016 2253   PHURINE 6.0 11/25/2016 2253   GLUCOSEU NEGATIVE 11/25/2016 2253   HGBUR NEGATIVE 11/25/2016 2253   BILIRUBINUR NEGATIVE 11/25/2016 2253   [place] 11/25/2016 2253   PROTEINUR NEGATIVE 11/25/2016 2253   NITRITE NEGATIVE 11/25/2016 2253   LEUKOCYTESUR SMALL (A) 11/25/2016 2253   Sepsis Labs Invalid input(s): PROCALCITONIN,  WBC,  [facility] Recent Results (from the past 240 hour(s))  Blood Culture (routine x 2)     Status: Abnormal   Collection Time: 11/25/16 12:07 PM  Result Value Ref Range Status   Specimen Description BLOOD RIGHT ANTECUBITAL  Final   Special Requests   Final    BOTTLES DRAWN AEROBIC AND ANAEROBIC Blood Culture adequate volume   Culture  Setup Time   Final    ANAEROBIC BOTTLE ONLY GRAM POSITIVE COCCI IN CLUSTERS CRITICAL RESULT CALLED TO, READ BACK BY AND VERIFIED WITH: E WILLIAMSON 11/26/16 @ 6045 M VESTAL Performed at [facility], [place] [address], [place], [place] [address]    Culture STAPHYLOCOCCUS SPECIES (COAGULASE NEGATIVE) (A)  Final   Report Status 11/29/2016 FINAL  Final   Organism ID, Bacteria STAPHYLOCOCCUS SPECIES (COAGULASE NEGATIVE)  Final      Susceptibility   Staphylococcus species (coagulase negative) - MIC*    CIPROFLOXACIN 1 SENSITIVE Sensitive     ERYTHROMYCIN >=8 RESISTANT Resistant     GENTAMICIN <=0.5 SENSITIVE Sensitive      OXACILLIN RESISTANT Resistant     TETRACYCLINE 2 SENSITIVE Sensitive     VANCOMYCIN 1 SENSITIVE Sensitive     TRIMETH/SULFA <=10 SENSITIVE Sensitive     CLINDAMYCIN <=0.25 SENSITIVE Sensitive     RIFAMPIN <=0.5 SENSITIVE Sensitive     Inducible Clindamycin NEGATIVE Sensitive     * STAPHYLOCOCCUS SPECIES (COAGULASE NEGATIVE)  Blood Culture ID Panel (Reflexed)     Status: Abnormal   Collection Time: 11/25/16 12:07 PM  Result Value Ref Range Status   Enterococcus species NOT DETECTED NOT DETECTED Final   Listeria monocytogenes NOT DETECTED NOT DETECTED Final   Staphylococcus species DETECTED (A) NOT DETECTED Final    Comment: Methicillin (oxacillin) susceptible coagulase negative staphylococcus. Possible blood culture contaminant (unless isolated from more than one blood culture draw or clinical case suggests pathogenicity). No antibiotic treatment is indicated for blood  culture contaminants. CRITICAL RESULT CALLED TO, READ BACK BY AND VERIFIED WITH: E WILLIAMSON 11/26/16 @ 1914 M VESTAL    Staphylococcus aureus NOT DETECTED NOT DETECTED Final   Methicillin resistance NOT DETECTED NOT DETECTED Final   Streptococcus species NOT DETECTED NOT DETECTED Final   Streptococcus agalactiae NOT DETECTED NOT DETECTED Final   Streptococcus pneumoniae NOT DETECTED NOT DETECTED Final   Streptococcus pyogenes NOT DETECTED NOT DETECTED Final   Acinetobacter baumannii NOT DETECTED NOT DETECTED Final   Enterobacteriaceae species NOT DETECTED NOT DETECTED Final   Enterobacter cloacae  complex NOT DETECTED NOT DETECTED Final   Escherichia coli NOT DETECTED NOT DETECTED Final   Klebsiella oxytoca NOT DETECTED NOT DETECTED Final   Klebsiella pneumoniae NOT DETECTED NOT DETECTED Final   Proteus species NOT DETECTED NOT DETECTED Final   Serratia marcescens NOT DETECTED NOT DETECTED Final   Haemophilus influenzae NOT DETECTED NOT DETECTED Final   Neisseria meningitidis NOT DETECTED NOT DETECTED Final    Pseudomonas aeruginosa NOT DETECTED NOT DETECTED Final   Candida albicans NOT DETECTED NOT DETECTED Final   Candida glabrata NOT DETECTED NOT DETECTED Final   Candida krusei NOT DETECTED NOT DETECTED Final   Candida parapsilosis NOT DETECTED NOT DETECTED Final   Candida tropicalis NOT DETECTED NOT DETECTED Final    Comment: Performed at [facility], [place] [address] [place], [place] [address]  Blood Culture (routine x 2)     Status: Abnormal   Collection Time: 11/25/16 12:12 PM  Result Value Ref Range Status   Specimen Description BLOOD RIGHT HAND  Final   Special Requests   Final    BOTTLES DRAWN AEROBIC AND ANAEROBIC Blood Culture adequate volume   Culture  Setup Time   Final    GRAM POSITIVE COCCI IN CLUSTERS AEROBIC BOTTLE ONLY CRITICAL VALUE NOTED.  VALUE IS CONSISTENT WITH PREVIOUSLY REPORTED AND CALLED VALUE.    Culture (A)  Final    STAPHYLOCOCCUS SPECIES (COAGULASE NEGATIVE) SUSCEPTIBILITIES PERFORMED ON PREVIOUS CULTURE WITHIN THE LAST 5 DAYS. Performed at [facility], [place] [address] [place], [place] [address]    Report Status 11/29/2016 FINAL  Final  MRSA PCR Screening     Status: None   Collection Time: 11/25/16  2:43 PM  Result Value Ref Range Status   MRSA by PCR NEGATIVE NEGATIVE Final    Comment:        The GeneXpert MRSA Assay (FDA approved for NASAL specimens only), is one component of a comprehensive MRSA colonization surveillance program. It is not intended to diagnose MRSA infection nor to guide or monitor treatment for MRSA infections.   Culture, blood (x 2)     Status: None (Preliminary result)   Collection Time: 11/25/16  2:56 PM  Result Value Ref Range Status   Specimen Description BLOOD LEFT ARM  Final   Special Requests AEROBIC BOTTLE ONLY Blood Culture adequate volume  Final   Culture   Final    NO GROWTH 4 DAYS Performed at [facility], [address], [place], [place] [address]    Report Status PENDING  Incomplete   Culture, blood (x 2)     Status: None (Preliminary result)   Collection Time: 11/25/16  2:58 PM  Result Value Ref Range Status   Specimen Description BLOOD RIGHT HAND  Final   Special Requests   Final    BOTTLES DRAWN AEROBIC AND ANAEROBIC Blood Culture adequate volume   Culture   Final    NO GROWTH 4 DAYS Performed at [facility], [place] [address] [place], [place] [address]    Report Status PENDING  Incomplete  Culture, sputum-assessment     Status: None   Collection Time: 11/25/16  3:32 PM  Result Value Ref Range Status   Specimen Description EXPECTORATED SPUTUM  Final   Special Requests NONE  Final   Sputum evaluation THIS SPECIMEN IS ACCEPTABLE FOR SPUTUM CULTURE  Final   Report Status 11/25/2016 FINAL  Final  Culture, respiratory (NON-Expectorated)     Status: None   Collection Time:  11/25/16  3:32 PM  Result Value Ref Range Status   Specimen Description EXPECTORATED SPUTUM  Final   Special Requests NONE Reflexed from [phone]  Final   Gram Stain   Final    ABUNDANT WBC PRESENT, PREDOMINANTLY PMN ABUNDANT GRAM NEGATIVE RODS ABUNDANT GRAM POSITIVE COCCI IN PAIRS FEW GRAM POSITIVE RODS FEW YEAST MODERATE SQUAMOUS EPITHELIAL CELLS PRESENT    Culture   Final    Consistent with normal respiratory flora. Performed at [facility], [place] [address], [place], [place] [address]    Report Status 11/28/2016 FINAL  Final     Time coordinating discharge: 35 minutes  SIGNED:  Chipper Oman, MD  [facility] 11/29/2016, 2:00 PM  Pager please text page via  [email or web address] Password [email or web address]

## 2016-11-30 DIAGNOSIS — B9561 Methicillin susceptible Staphylococcus aureus infection as the cause of diseases classified elsewhere: Secondary | ICD-10-CM | POA: Diagnosis not present

## 2016-11-30 LAB — CULTURE, BLOOD (ROUTINE X 2)
CULTURE: NO GROWTH
Culture: NO GROWTH
Special Requests: ADEQUATE
Special Requests: ADEQUATE

## 2016-11-30 NOTE — ED Provider Notes (Addendum)
[facility] DEPT Provider Note   CSN: [id number] Arrival date & time: 11/25/16  1130     History   Chief Complaint Chief Complaint  Patient presents with  . Shortness of Breath    HPI Courtney Brown is a 76 y.o. female.  HPI Patient presents to the emergency department with increasing shortness of breath over the past 2 weeks now found to be hypoxic and febrile 103.2.  Require oxygen at this time.  She reports generalized weakness and shortness of breath.  No unilateral leg swelling.  She does have a history of COPD but does not wear oxygen at home.  Patient was given albuterol and Atrovent in route with minimal improvement in symptoms.  Symptoms are moderate to severe in severity   Past Medical History:  Diagnosis Date  . Allergy   . Anxiety   . Colon polyps   . COPD (chronic obstructive pulmonary disease) ([place])   . Emphysema of lung ([place])   . GERD (gastroesophageal reflux disease)   . H/O hiatal hernia   . Hyperlipidemia   . Hypertension   . Myocardial infarct, old 2008   no stent placed  . Osteoporosis   . Stroke [facility])    TIA    Patient Active Problem List   Diagnosis Date Noted  . Sepsis due to Streptococcus pneumoniae ([place])   . Acute respiratory failure with hypoxia ([place])   . Influenza B   . Coagulase negative Staphylococcus bacteremia   . Community acquired pneumonia 11/25/2016  . Colon polyps 08/08/2016  . PVD (peripheral vascular disease) ([place]) 08/08/2016  . COPD exacerbation ([place]) 07/29/2016  . HTN (hypertension), benign 07/29/2016  . Hyperlipidemia 07/29/2016  . CAD (coronary artery disease) 07/29/2016  . Chronic diarrhea 07/29/2016  . Post herpetic neuralgia 07/29/2016  . Adjustment disorder 07/29/2016  . Osteoporosis 07/29/2016  . Post-menopausal 07/29/2016  . Gastroesophageal reflux disease without esophagitis 07/29/2016  . Hearing loss 07/29/2016    Past Surgical History:  Procedure Laterality Date  . ABDOMINAL HYSTERECTOMY  1998  .  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 HERNIA REPAIR     umbilical, ventral  . INSERTION OF ILIAC STENT  2008    OB History    No data available       Home Medications    Prior to Admission medications   Medication Sig Start Date End Date Taking? Authorizing Provider  acetaminophen (TYLENOL) 500 MG tablet Take 500 mg by mouth every 6 (six) hours as needed.   Yes [provider]  albuterol (PROVENTIL HFA;VENTOLIN HFA) 108 (90 Base) MCG/ACT inhaler Inhale 1-2 puffs into the lungs every 6 (six) hours as needed for wheezing or shortness of breath. 2 puff by mouth every 4 hours PRN 11/03/16  Yes Nche, Charlene Brooke, NP  ALPRAZolam (XANAX) 0.25 MG tablet TAKE 1 TABLET AT BEDTIME AS NEEDED FOR ANXIETY 11/03/16  Yes Nche, Charlene Brooke, NP  aspirin 81 MG tablet Take 81 mg by mouth daily.   Yes [provider]  diphenoxylate-atropine (LOMOTIL) 2.5-0.025 MG tablet Take by mouth 4 (four) times daily as needed for diarrhea or loose stools.   Yes [provider]  fenofibrate (TRICOR) 145 MG tablet Take 1 tablet (145 mg total) by mouth daily. 11/03/16  Yes Nche, Charlene Brooke, NP  Ferrous Gluconate-C-Folic Acid (IRON-C PO) Take 65 mg by mouth 1 day or 1 dose.   Yes [provider]  gabapentin (NEURONTIN) 100 MG capsule Take 1 capsule (100 mg  total) by mouth 3 (three) times daily. For shingle 11/03/16  Yes Nche, Charlene Brooke, NP  Garlic Oil 4010 MG CAPS Take 1,000 mg by mouth 1 day or 1 dose.   Yes [provider]  Lactobacillus (PROBIOTIC ACIDOPHILUS) CAPS Take by mouth once.   Yes [provider]  loratadine (CLARITIN) 10 MG tablet Take 10 mg by mouth daily. 06/02/16  Yes [provider]  lovastatin (MEVACOR) 40 MG tablet Take 1 tablet (40 mg total) by mouth at bedtime. Must keep 11/03/16 appt for future refills 11/03/16  Yes Nche, Charlene Brooke, NP  metoprolol succinate (TOPROL-XL) 50 MG 24 hr tablet Take 1 tablet (50 mg total) by mouth  2 (two) times daily. 11/03/16  Yes Nche, Charlene Brooke, NP  omeprazole (PRILOSEC) 20 MG capsule Take 1 capsule (20 mg total) by mouth daily. 11/03/16  Yes Nche, Charlene Brooke, NP  ondansetron (ZOFRAN) 4 MG tablet Take 4 mg by mouth every 8 (eight) hours as needed for nausea or vomiting.   Yes [provider]  raloxifene (EVISTA) 60 MG tablet Take 1 tablet (60 mg total) by mouth daily. 11/03/16  Yes Nche, Charlene Brooke, NP  SYMBICORT 160-4.5 MCG/ACT inhaler Inhale 2 puffs into the lungs 2 (two) times daily. Inhale 1-2 puffs twice a day 07/29/16  Yes Nche, Charlene Brooke, NP  tiotropium (SPIRIVA) 18 MCG inhalation capsule Place 1 capsule (18 mcg total) into inhaler and inhale daily. 10/31/16  Yes Nche, Charlene Brooke, NP  WELCHOL 625 MG tablet Take 1 tablet (625 mg total) by mouth 2 (two) times daily with a meal. 11/03/16  Yes Nche, Charlene Brooke, NP  amLODipine (NORVASC) 10 MG tablet Take 1 tablet (10 mg total) by mouth at bedtime. Must keep 11/03/16 appt for future refills 11/29/16   Patrecia Pour, Christean Grief, MD  Elastic Bandages & Supports (KNEE COMPRESSION SLEEVE/L/XL) MISC 1 Units by Does not apply route daily. 11/03/16   Nche, Charlene Brooke, NP  HYDROcodone-homatropine (HYCODAN) 5-1.5 MG/5ML syrup Take 5 mLs by mouth every 4 (four) hours as needed for cough. 11/29/16   Doreatha Lew, MD  oseltamivir (TAMIFLU) 30 MG capsule Take 1 capsule (30 mg total) by mouth 2 (two) times daily. 11/29/16   Doreatha Lew, MD  prednisoLONE 5 MG TABS tablet Take 8 tablets (40 mg total) by mouth daily before breakfast. 11/30/16 12/03/16  Patrecia Pour, Christean Grief, MD  vancomycin IVPB Inject 750 mg into the vein every 12 (twelve) hours. Indication:  Staph bacteremia Last Day of Therapy:  12/08/16 Labs - Monday:  CBC/D, BMP, and vancomycin trough. 11/29/16 12/08/16  Doreatha Lew, MD    Family History Family History  Problem Relation Age of Onset  . Hearing loss Father   . Hearing loss Maternal Aunt   . Hearing loss Maternal  Grandmother   . Cancer Maternal Grandmother     colon  . Arthritis Mother   . Cancer Paternal Grandmother     colon    Social History Social History  Substance Use Topics  . Smoking status: Former Smoker    Packs/day: 1.50    Years: 15.00  . Smokeless tobacco: Never Used     Comment: stop 04/05/2006  . Alcohol use 1.2 oz/week    2 Shots of liquor per week     Comment: social, weekly     Allergies   Prednisone   Review of Systems Review of Systems  All other systems reviewed and are negative.    Physical Exam Updated  Vital Signs BP 139/65 (BP Location: Left Arm)   Pulse 75   Temp 98 F (36.7 C) (Oral)   Resp 18   Ht 5\' 1"  (1.549 m)   Wt 172 lb 4.8 oz (78.2 kg)   SpO2 95%   BMI 32.56 kg/m   Physical Exam  Constitutional: She is oriented to person, place, and time. She appears well-developed and well-nourished. She appears distressed.  HENT:  Head: Normocephalic and atraumatic.  Eyes: EOM are normal.  Neck: Normal range of motion.  Cardiovascular: Regular rhythm and normal heart sounds.   tachycardiac  Pulmonary/Chest:  Rhonchi througout. Tachypnea. Accessory muscle use. distress  Abdominal: Soft. She exhibits no distension. There is no tenderness.  Musculoskeletal: Normal range of motion.  Neurological: She is alert and oriented to person, place, and time.  Skin: Skin is warm and dry.  Psychiatric: She has a normal mood and affect. Judgment normal.  Nursing note and vitals reviewed.    ED Treatments / Results  Labs (all labs ordered are listed, but only abnormal results are displayed) Labs Reviewed                             :           COMPREHENSIVE METABOLIC PANEL - Abnormal; Notable for the following:    Potassium 3.0 (*)    Chloride 98 (*)    Glucose, Bld 156 (*)    Creatinine, Ser 1.01 (*)    Calcium 8.3 (*)    Albumin 3.1 (*)    Alkaline Phosphatase 33 (*)    GFR calc non Af Amer 53 (*)    All other components within normal  limits  CBC WITH DIFFERENTIAL/PLATELET - Abnormal; Notable for the following:    WBC 13.5 (*)    RBC 5.19 (*)    Hemoglobin 15.8 (*)    HCT 48.2 (*)    Neutro Abs 11.2 (*)    Monocytes Absolute 1.1 (*)    All other components within normal limits  URINALYSIS, ROUTINE W REFLEX MICROSCOPIC - Abnormal; Notable for the following:    Leukocytes, UA SMALL (*)    Bacteria, UA RARE (*)    Squamous Epithelial / LPF 0-5 (*)    All other components within normal limits  INFLUENZA PANEL BY PCR (TYPE A & B) - Abnormal; Notable for the following:    Influenza B By PCR POSITIVE (*)    All other components within normal limits  LACTIC ACID, PLASMA - Abnormal; Notable for the following:    Lactic Acid, Venous 2.6 (*)    All other components within normal limits  LACTIC ACID, PLASMA - Abnormal; Notable for the following:    Lactic Acid, Venous 3.5 (*)    All other components within normal limits  STREP PNEUMONIAE URINARY ANTIGEN - Abnormal; Notable for the following:    Strep Pneumo Urinary Antigen POSITIVE (*)    All other components within normal limits  CBC - Abnormal; Notable for the following:    WBC 14.3 (*)    All other components within normal limits  CREATININE, SERUM - Abnormal; Notable for the following:    GFR calc non Af Amer 55 (*)    All other components within normal limits  BASIC METABOLIC PANEL - Abnormal; Notable for the following:    Calcium 7.3 (*)    All other components within normal limits  CBC - Abnormal; Notable for the following:  WBC 12.7 (*)    Hemoglobin 11.8 (*)    All other components within normal limits  GLUCOSE, CAPILLARY - Abnormal; Notable for the following:    Glucose-Capillary 101 (*)    All other components within normal limits  ALBUMIN - Abnormal; Notable for the following:    Albumin 2.2 (*)    All other components within normal limits  CBC WITH DIFFERENTIAL/PLATELET - Abnormal; Notable for the following:    Monocytes Absolute 1.2 (*)    All  other components within normal limits  BASIC METABOLIC PANEL - Abnormal; Notable for the following:    Potassium 3.2 (*)    Calcium 8.0 (*)    All other components within normal limits  GLUCOSE, CAPILLARY - Abnormal; Notable for the following:    Glucose-Capillary 103 (*)    All other components within normal limits  BASIC METABOLIC PANEL - Abnormal; Notable for the following:    Glucose, Bld 139 (*)    Calcium 8.6 (*)    All other components within normal limits  GLUCOSE, CAPILLARY - Abnormal; Notable for the following:    Glucose-Capillary 127 (*)    All other components within normal limits  I-STAT CG4 LACTIC ACID, ED - Abnormal; Notable for the following:    Lactic Acid, Venous 2.29 (*)    All other components within normal limits  CULTURE, BLOOD (ROUTINE X 2)  CULTURE, BLOOD (ROUTINE X 2)  CULTURE, EXPECTORATED SPUTUM-ASSESSMENT  MRSA PCR SCREENING  CULTURE, RESPIRATORY (NON-EXPECTORATED)  PROCALCITONIN  HIV ANTIBODY (ROUTINE TESTING)  TSH  LACTIC ACID, PLASMA  LACTIC ACID, PLASMA  PROCALCITONIN  PROCALCITONIN  GLUCOSE, CAPILLARY    EKG  EKG Interpretation  Date/Time:  Tuesday Nov 25 2016 11:44:35 EDT Ventricular Rate:  136 PR Interval:    QRS Duration: 99 QT Interval:  300 QTC Calculation: 448 R Axis:   -60 Text Interpretation:  Atrial fibrillation Left anterior fascicular block Abnormal R-wave progression, late transition LVH with secondary repolarization abnormality Borderline ST elevation No old tracing to compare Confirmed by Daleen Bo [phone]) on 11/27/2016 12:08:54 AM       Radiology No results found.   ++++++++++++++++++++++++++++++++++++++++++++   Procedures .Critical Care Performed by: Jola Schmidt Authorized by: Jola Schmidt     Total critical care time: 31 minutes Critical care time was exclusive of separately billable procedures and treating other patients. Critical care was necessary to treat or prevent imminent or  life-threatening deterioration. Critical care was time spent personally by me on the following activities: development of treatment plan with patient and/or surrogate as well as nursing, discussions with consultants, evaluation of patient's response to treatment, examination of patient, obtaining history from patient or surrogate, ordering and performing treatments and interventions, ordering and review of laboratory studies, ordering and review of radiographic studies, pulse oximetry and re-evaluation of patient's condition.   +++++++++++++++++++++++++++++++++++++++++++++++++     Medications Ordered in ED Medications  sodium chloride 0.9 % bolus 1,785 mL (1,785 mLs Intravenous New Bag/Given 11/25/16 1241)  cefTRIAXone (ROCEPHIN) 1 g in dextrose 5 % 50 mL IVPB (0 g Intravenous Stopped 11/25/16 1348)  azithromycin (ZITHROMAX) 500 mg in dextrose 5 % 250 mL IVPB (0 mg Intravenous Stopped 11/25/16 1416)  fentaNYL (SUBLIMAZE) injection 50 mcg (50 mcg Intravenous Given 11/25/16 1252)  acetaminophen (TYLENOL) tablet 650 mg (650 mg Oral Given 11/25/16 1309)  potassium chloride SA (K-DUR,KLOR-CON) CR tablet 40 mEq (40 mEq Oral Given 11/25/16 1600)  cefTRIAXone (ROCEPHIN) 1 g in dextrose 5 % 50 mL IVPB (0  g Intravenous Stopped 11/26/16 1515)  potassium chloride SA (K-DUR,KLOR-CON) CR tablet 40 mEq (40 mEq Oral Given 11/27/16 1242)  azithromycin (ZITHROMAX) tablet 500 mg (500 mg Oral Given 11/27/16 1241)  methylPREDNISolone sodium succinate (SOLU-MEDROL) 125 mg/2 mL injection 125 mg (125 mg Intravenous Given 11/27/16 1152)  heparin lock flush 100 unit/mL (250 Units Intracatheter Given 11/29/16 1355)     Initial Impression / Assessment and Plan / ED Course  I have reviewed the triage vital signs and the nursing notes.  Pertinent labs & imaging results that were available during my care of the patient were reviewed by me and considered in my medical decision making (see chart for details).     Acute respiratory failure  and evidence of sepsis with community-acquired pneumonia.  30 cc/kg bolus given.  IV antibiotics.  Patient be admitted to stepdown unit.  She is requiring significant amount of supplemental oxygen and is 92% on 6 L nasal cannula.  Final Clinical Impressions(s) / ED Diagnoses   Final diagnoses:  Acute respiratory failure with hypoxia ([place])  Community acquired pneumonia, unspecified laterality  Sepsis, due to unspecified organism [facility])    New Prescriptions     START taking these medications   Details                           Jola Schmidt, MD 11/30/16 [id number]    Jola Schmidt, MD 12/08/16 [phone]

## 2016-12-01 ENCOUNTER — Telehealth: Payer: Self-pay | Admitting: *Deleted

## 2016-12-01 ENCOUNTER — Encounter: Payer: Self-pay | Admitting: Nurse Practitioner

## 2016-12-01 DIAGNOSIS — R7881 Bacteremia: Secondary | ICD-10-CM | POA: Diagnosis not present

## 2016-12-01 LAB — CBC AND DIFFERENTIAL
HEMATOCRIT: 44 % (ref 36–46)
HEMOGLOBIN: 13.7 g/dL (ref 12.0–16.0)
NEUTROS ABS: 9 /uL
PLATELETS: 601 10*3/uL — AB (ref 150–399)
WBC: 16 10*3/mL

## 2016-12-01 LAB — BASIC METABOLIC PANEL
BUN: 20 mg/dL (ref 4–21)
CREATININE: 0.7 mg/dL (ref 0.5–1.1)
Glucose: 74 mg/dL
Potassium: 3.6 mmol/L (ref 3.4–5.3)
Sodium: 144 mmol/L (ref 137–147)

## 2016-12-01 NOTE — Telephone Encounter (Signed)
Transition Care Management Follow-up Telephone Call   Date discharged? 11/29/16   How have you been since you were released from the hospital? Pt states she is doing ok just still little weak   Do you understand why you were in the hospital? YES   Do you understand the discharge instructions? YES   Where were you discharged to? Home   Items Reviewed:  Medications reviewed: YES  Allergies reviewed: YES  Dietary changes reviewed: YES, heart healthy  Referrals reviewed: YES   Functional Questionnaire:   Activities of Daily Living (ADLs):   She states she are independent in the following: ambulation, bathing and hygiene, feeding, continence, grooming, toileting and dressing States she don't  require assistance    Any transportation issues/concerns?: NO   Any patient concerns? NO   Confirmed importance and date/time of follow-up visits scheduled YES, pt had already called to make appt for 12/08/16  Provider Appointment booked with Wilfred Lacy, NP  Confirmed with patient if condition begins to worsen call PCP or go to the ER.  Patient was given the office number and encouraged to call back with question or concerns.  : YES

## 2016-12-05 ENCOUNTER — Encounter: Payer: Self-pay | Admitting: Nurse Practitioner

## 2016-12-05 DIAGNOSIS — B9561 Methicillin susceptible Staphylococcus aureus infection as the cause of diseases classified elsewhere: Secondary | ICD-10-CM | POA: Diagnosis not present

## 2016-12-05 LAB — VANCOMYCIN TROUGH, SERUM: VANCOMYCIN TR: 14.2

## 2016-12-05 NOTE — Progress Notes (Signed)
Abstracted result and sent to scan  

## 2016-12-08 ENCOUNTER — Ambulatory Visit (INDEPENDENT_AMBULATORY_CARE_PROVIDER_SITE_OTHER)
Admission: RE | Admit: 2016-12-08 | Discharge: 2016-12-08 | Disposition: A | Discharge status: Home / Self Care | Payer: Medicare Other | Source: Ambulatory Visit | Attending: Nurse Practitioner | Admitting: Nurse Practitioner

## 2016-12-08 ENCOUNTER — Ambulatory Visit (INDEPENDENT_AMBULATORY_CARE_PROVIDER_SITE_OTHER): Payer: Medicare Other | Admitting: Nurse Practitioner

## 2016-12-08 ENCOUNTER — Ambulatory Visit: Payer: Medicare Other

## 2016-12-08 ENCOUNTER — Telehealth: Payer: Self-pay

## 2016-12-08 ENCOUNTER — Encounter: Payer: Self-pay | Admitting: Nurse Practitioner

## 2016-12-08 VITALS — BP 130/66 | HR 78 | Temp 97.7°F | Ht 61.0 in | Wt 173.0 lb

## 2016-12-08 DIAGNOSIS — J441 Chronic obstructive pulmonary disease with (acute) exacerbation: Secondary | ICD-10-CM

## 2016-12-08 DIAGNOSIS — I10 Essential (primary) hypertension: Secondary | ICD-10-CM

## 2016-12-08 DIAGNOSIS — J439 Emphysema, unspecified: Secondary | ICD-10-CM | POA: Diagnosis not present

## 2016-12-08 DIAGNOSIS — J189 Pneumonia, unspecified organism: Secondary | ICD-10-CM

## 2016-12-08 DIAGNOSIS — J181 Lobar pneumonia, unspecified organism: Secondary | ICD-10-CM

## 2016-12-08 DIAGNOSIS — R05 Cough: Secondary | ICD-10-CM | POA: Diagnosis not present

## 2016-12-08 MED ORDER — AMLODIPINE BESYLATE 10 MG PO TABS: 10.0000 mg | ORAL_TABLET | Freq: Every day | ORAL | 1 refills | Status: DC

## 2016-12-08 MED ORDER — ALBUTEROL SULFATE HFA 108 (90 BASE) MCG/ACT IN AERS: 1.0000 | INHALATION_SPRAY | Freq: Four times a day (QID) | RESPIRATORY_TRACT | 5 refills | Status: DC | PRN

## 2016-12-08 NOTE — Telephone Encounter (Signed)
  Message on voice mail:  Sumner Boast, RN calling regarding PICC removal and labs.  Patient normally receives IV medications at 11:00 am and would like to move time in order to make primary care appointment.

## 2016-12-08 NOTE — Progress Notes (Signed)
Subjective:  Patient ID: Courtney Brown, female    DOB: 1941-05-16  Age: 76 y.o. MRN: [id number]  CC: Hospitalization Follow-up (hospital f/u---better now--has PICC line on right arm--last does tonight)   HPI Courtney Brown is in office today with daughter. Admission: 11/25/16 Discharge: 11/29/2016. TCM call completed 12/01/2016. Courtney Brown presented to ED with SOB,weakness, fever and cough x2weeks. She was then diagnosed with bilateral pneumonia, influenza and sepsis secondary to staph hominis and strep antigen positive. She was treated with IV abx, tamiflu, and IVF. She was discharged home with PICC line and IV vancomycin managed by [facility].  Labs and radiology reports reviewed.  She is home with daughter. She denies any fever or increased SOB or chest pain or PND or orthopnea since discharge. She has persistent cough and occasional wheezing. Uses inhalers as prescribed.  Has persistent LE edema (no change per patient, has not used compression stocking as prescribed). She denies any discomfort at PICC line site.  Medication list reconciled with patient and her daughter. Outpatient Medications Prior to Visit  Medication Sig Dispense Refill  . acetaminophen (TYLENOL) 500 MG tablet Take 500 mg by mouth every 6 (six) hours as needed.    . ALPRAZolam (XANAX) 0.25 MG tablet TAKE 1 TABLET AT BEDTIME AS NEEDED FOR ANXIETY 30 tablet 1  . aspirin 81 MG tablet Take 81 mg by mouth daily.    . diphenoxylate-atropine (LOMOTIL) 2.5-0.025 MG tablet Take by mouth 4 (four) times daily as needed for diarrhea or loose stools.    Water engineer Bandages & Supports (KNEE COMPRESSION SLEEVE/L/XL) MISC 1 Units by Does not apply route daily. 1 each 0  . fenofibrate (TRICOR) 145 MG tablet Take 1 tablet (145 mg total) by mouth daily. 90 tablet 1  . Ferrous Gluconate-C-Folic Acid (IRON-C PO) Take 65 mg by mouth 1 day or 1 dose.    . gabapentin (NEURONTIN) 100 MG capsule Take 1 capsule (100 mg total) by  mouth 3 (three) times daily. For shingle 270 capsule 1  . Garlic Oil 3474 MG CAPS Take 1,000 mg by mouth 1 day or 1 dose.    Marland Kitchen HYDROcodone-homatropine (HYCODAN) 5-1.5 MG/5ML syrup Take 5 mLs by mouth every 4 (four) hours as needed for cough. 120 mL 0  . Lactobacillus (PROBIOTIC ACIDOPHILUS) CAPS Take by mouth once.    . loratadine (CLARITIN) 10 MG tablet Take 10 mg by mouth daily.  3  . lovastatin (MEVACOR) 40 MG tablet Take 1 tablet (40 mg total) by mouth at bedtime. Must keep 11/03/16 appt for future refills 90 tablet 1  . metoprolol succinate (TOPROL-XL) 50 MG 24 hr tablet Take 1 tablet (50 mg total) by mouth 2 (two) times daily. 180 tablet 1  . omeprazole (PRILOSEC) 20 MG capsule Take 1 capsule (20 mg total) by mouth daily. 90 capsule 1  . ondansetron (ZOFRAN) 4 MG tablet Take 4 mg by mouth every 8 (eight) hours as needed for nausea or vomiting.    . raloxifene (EVISTA) 60 MG tablet Take 1 tablet (60 mg total) by mouth daily. 90 tablet 1  . SYMBICORT 160-4.5 MCG/ACT inhaler Inhale 2 puffs into the lungs 2 (two) times daily. Inhale 1-2 puffs twice a day 1 Inhaler 5  . tiotropium (SPIRIVA) 18 MCG inhalation capsule Place 1 capsule (18 mcg total) into inhaler and inhale daily. 30 capsule 12  . vancomycin IVPB Inject 750 mg into the vein every 12 (twelve) hours. Indication:  Staph bacteremia Last Day of Therapy:  12/08/16 Labs - Monday:  CBC/D, BMP, and vancomycin trough. 18 Units 0  . WELCHOL 625 MG tablet Take 1 tablet (625 mg total) by mouth 2 (two) times daily with a meal. 180 tablet 1  . albuterol (PROVENTIL HFA;VENTOLIN HFA) 108 (90 Base) MCG/ACT inhaler Inhale 1-2 puffs into the lungs every 6 (six) hours as needed for wheezing or shortness of breath. 2 puff by mouth every 4 hours PRN 1 Inhaler 3  . amLODipine (NORVASC) 10 MG tablet Take 1 tablet (10 mg total) by mouth at bedtime. Must keep 11/03/16 appt for future refills 30 tablet 0  . oseltamivir (TAMIFLU) 30 MG capsule Take 1 capsule (30 mg  total) by mouth 2 (two) times daily. 2 capsule 0   No facility-administered medications prior to visit.     ROS See HPI  Objective:  BP 130/66   Pulse 78   Temp 97.7 F (36.5 C)   Ht 5\' 1"  (1.549 m)   Wt 173 lb (78.5 kg)   SpO2 94%   BMI 32.69 kg/m   BP Readings from Last 3 Encounters:  12/08/16 130/66  11/29/16 139/65  11/03/16 (!) 146/70    Wt Readings from Last 3 Encounters:  12/08/16 173 lb (78.5 kg)  11/29/16 172 lb 4.8 oz (78.2 kg)  11/03/16 174 lb (78.9 kg)    Physical Exam  Constitutional: She is oriented to person, place, and time. No distress.  Neck: Normal range of motion. Neck supple.  Cardiovascular: Normal rate and regular rhythm.   Pulmonary/Chest: Effort normal. No respiratory distress. She has no wheezes. She has rales. She exhibits no tenderness.  Rales in bilateral lung bases.  Musculoskeletal: She exhibits edema.  Bilateral LE edema (pitting), no erythema or tenderness  Lymphadenopathy:    She has no cervical adenopathy.  Neurological: She is alert and oriented to person, place, and time.  Skin: Skin is warm and dry.  Right upper arm PICC LINE site: clean and dry, clamped.  Vitals reviewed.   Lab Results  Component Value Date   WBC 16.0 12/01/2016   HGB 13.7 12/01/2016   HCT 44 12/01/2016   PLT 601 (A) 12/01/2016   GLUCOSE 139 (H) 11/28/2016   CHOL 198 07/29/2016   TRIG 186.0 (H) 07/29/2016   HDL 56.00 07/29/2016   LDLCALC 104 (H) 07/29/2016   ALT 19 11/25/2016   AST 23 11/25/2016   NA 144 12/01/2016   K 3.6 12/01/2016   CL 103 11/28/2016   CREATININE 0.7 12/01/2016   BUN 20 12/01/2016   CO2 28 11/28/2016   TSH 0.604 11/25/2016    Dg Chest Port 1 View  Result Date: 11/25/2016 CLINICAL DATA:  Cough with shortness of breath EXAM: PORTABLE CHEST 1 VIEW COMPARISON:  August 08, 2016 FINDINGS: There is focal airspace consolidation in the periphery of the right mid lung. There is also subtle infiltrate in the left base. Lungs  elsewhere clear. Heart is mildly enlarged with pulmonary vascularity within normal limits. No adenopathy. There is aortic atherosclerosis. No bone lesions. IMPRESSION: Airspace consolidation in the right mid lung and to a lesser extent in the left base consistent with pneumonia. Lungs elsewhere clear. Stable cardiac prominence. There is aortic atherosclerosis. Followup PA and lateral chest radiographs recommended in 3-4 weeks following trial of antibiotic therapy to ensure resolution and exclude underlying malignancy. Electronically Signed   By: Lowella Grip III M.D.   On: 11/25/2016 12:43    Assessment & Plan:   Lynnley was seen today for  hospitalization follow-up.  Diagnoses and all orders for this visit:  Pneumonia of both lower lobes due to infectious organism ([place]) -     albuterol (PROAIR HFA) 108 (90 Base) MCG/ACT inhaler; Inhale 1-2 puffs into the lungs every 6 (six) hours as needed for wheezing or shortness of breath. -     DG Chest 2 View; Future  HTN (hypertension), benign -     Discontinue: amLODipine (NORVASC) 10 MG tablet; Take 1 tablet (10 mg total) by mouth at bedtime. Must keep 11/03/16 appt for future refills -     amLODipine (NORVASC) 10 MG tablet; Take 1 tablet (10 mg total) by mouth at bedtime. Must keep 11/03/16 appt for future refills  Pulmonary emphysema, unspecified emphysema type (HCC)  COPD exacerbation (HCC) -     albuterol (PROAIR HFA) 108 (90 Base) MCG/ACT inhaler; Inhale 1-2 puffs into the lungs every 6 (six) hours as needed for wheezing or shortness of breath.   I have discontinued Courtney Brown's albuterol and oseltamivir. I am also having her start on albuterol. Additionally, I am having her maintain her loratadine, diphenoxylate-atropine, ondansetron, aspirin, PROBIOTIC ACIDOPHILUS, acetaminophen, Garlic Oil, Ferrous Gluconate-C-Folic Acid (IRON-C PO), SYMBICORT, tiotropium, gabapentin, WELCHOL, lovastatin, metoprolol succinate, omeprazole, raloxifene,  fenofibrate, ALPRAZolam, Knee Compression Sleeve/L/XL, vancomycin, HYDROcodone-homatropine, vancomycin, sodium chloride, and amLODipine.  Meds ordered this encounter  Medications  . vancomycin (VANCOCIN) 10 G SOLR injection  . sodium chloride 0.9 % infusion  . albuterol (PROAIR HFA) 108 (90 Base) MCG/ACT inhaler    Sig: Inhale 1-2 puffs into the lungs every 6 (six) hours as needed for wheezing or shortness of breath.    Dispense:  1 Inhaler    Refill:  5    Order Specific Question:   Supervising Provider    Answer:   Cassandria Anger [[id number]]  . DISCONTD: amLODipine (NORVASC) 10 MG tablet    Sig: Take 1 tablet (10 mg total) by mouth at bedtime. Must keep 11/03/16 appt for future refills    Dispense:  90 tablet    Refill:  1    Order Specific Question:   Supervising Provider    Answer:   Cassandria Anger [[id number]]  . amLODipine (NORVASC) 10 MG tablet    Sig: Take 1 tablet (10 mg total) by mouth at bedtime. Must keep 11/03/16 appt for future refills    Dispense:  90 tablet    Refill:  1    Order Specific Question:   Supervising Provider    Answer:   Cassandria Anger [[id number]]    Follow-up: Return in about 2 weeks (around 12/22/2016) for HTN, COPD.Marland Kitchen  Wilfred Lacy, NP

## 2016-12-09 ENCOUNTER — Telehealth: Payer: Self-pay | Admitting: Nurse Practitioner

## 2016-12-09 DIAGNOSIS — J158 Pneumonia due to other specified bacteria: Secondary | ICD-10-CM | POA: Diagnosis not present

## 2016-12-09 NOTE — Telephone Encounter (Signed)
Pt is being discharged today from services.

## 2016-12-09 NOTE — Telephone Encounter (Signed)
Just FYI from the nurse.

## 2016-12-09 NOTE — Patient Instructions (Addendum)
Maintain upcoming appt with infectious disease 12/16/2016.

## 2016-12-16 ENCOUNTER — Ambulatory Visit (INDEPENDENT_AMBULATORY_CARE_PROVIDER_SITE_OTHER): Payer: Medicare Other | Admitting: Internal Medicine

## 2016-12-16 ENCOUNTER — Encounter: Payer: Self-pay | Admitting: Internal Medicine

## 2016-12-16 DIAGNOSIS — B957 Other staphylococcus as the cause of diseases classified elsewhere: Secondary | ICD-10-CM

## 2016-12-16 DIAGNOSIS — R7881 Bacteremia: Secondary | ICD-10-CM

## 2016-12-16 DIAGNOSIS — A403 Sepsis due to Streptococcus pneumoniae: Secondary | ICD-10-CM

## 2016-12-16 DIAGNOSIS — J101 Influenza due to other identified influenza virus with other respiratory manifestations: Secondary | ICD-10-CM

## 2016-12-16 NOTE — Assessment & Plan Note (Signed)
Resolved now. Not sure why she got it but now off of therapy and I do not have an indication to repeat blood cultures (no pacemaker, no hardware)

## 2016-12-16 NOTE — Assessment & Plan Note (Signed)
resolved 

## 2016-12-16 NOTE — Progress Notes (Signed)
   Subjective:    Patient ID: Courtney Brown, female    DOB: [birth year], 76 y.o.   MRN: [id number]  HPI Here for hospital follow up. She had flu and bacteremia with Strep and MRSE (Staph hominis).  She was treated with vancomycin after initially starting ceftriaxone and now is doing well.  No associated n/v/d.  Completed antibiotics over 1 week ago.  Repeat blood cultures done only hours after positivity remained negative.  No current fever or chills.    Review of Systems  Constitutional: Negative for chills and fever.  Gastrointestinal: Negative for diarrhea and nausea.  Skin: Negative for rash.  Neurological: Negative for dizziness.       Objective:   Physical Exam  Constitutional: She appears well-developed and well-nourished.  Eyes: No scleral icterus.  Cardiovascular: Normal rate, regular rhythm and normal heart sounds.   No murmur heard. Pulmonary/Chest: Effort normal and breath sounds normal. No respiratory distress.  Lymphadenopathy:    She has no cervical adenopathy.  Skin: No rash noted.    SH: former smoker      Assessment & Plan:

## 2016-12-23 ENCOUNTER — Ambulatory Visit (INDEPENDENT_AMBULATORY_CARE_PROVIDER_SITE_OTHER): Payer: Medicare Other | Admitting: Nurse Practitioner

## 2016-12-23 ENCOUNTER — Encounter: Payer: Self-pay | Admitting: Nurse Practitioner

## 2016-12-23 VITALS — BP 134/70 | HR 76 | Temp 97.7°F | Ht 61.0 in | Wt 171.0 lb

## 2016-12-23 DIAGNOSIS — J439 Emphysema, unspecified: Secondary | ICD-10-CM | POA: Diagnosis not present

## 2016-12-23 DIAGNOSIS — Z23 Encounter for immunization: Secondary | ICD-10-CM

## 2016-12-23 DIAGNOSIS — I10 Essential (primary) hypertension: Secondary | ICD-10-CM | POA: Diagnosis not present

## 2016-12-23 NOTE — Progress Notes (Signed)
Subjective:  Patient ID: Courtney Brown, female    DOB: Jan 03, 1941  Age: 76 y.o. MRN: [id number]  CC: Follow-up (2 wk f/u)   HPI  HTN: stable with metoprolol and norvasc. She is now avoiding high sodium diet.  COPD: Stable with Spiriva, albuterol and Symbicort. Still smokes and not interested in quitting at this time.  Outpatient Medications Prior to Visit  Medication Sig Dispense Refill  . acetaminophen (TYLENOL) 500 MG tablet Take 500 mg by mouth every 6 (six) hours as needed.    Marland Kitchen albuterol (PROAIR HFA) 108 (90 Base) MCG/ACT inhaler Inhale 1-2 puffs into the lungs every 6 (six) hours as needed for wheezing or shortness of breath. 1 Inhaler 5  . ALPRAZolam (XANAX) 0.25 MG tablet TAKE 1 TABLET AT BEDTIME AS NEEDED FOR ANXIETY 30 tablet 1  . amLODipine (NORVASC) 10 MG tablet Take 1 tablet (10 mg total) by mouth at bedtime. Must keep 11/03/16 appt for future refills 90 tablet 1  . aspirin 81 MG tablet Take 81 mg by mouth daily.    . diphenoxylate-atropine (LOMOTIL) 2.5-0.025 MG tablet Take by mouth 4 (four) times daily as needed for diarrhea or loose stools.    Water engineer Bandages & Supports (KNEE COMPRESSION SLEEVE/L/XL) MISC 1 Units by Does not apply route daily. 1 each 0  . fenofibrate (TRICOR) 145 MG tablet Take 1 tablet (145 mg total) by mouth daily. 90 tablet 1  . Ferrous Gluconate-C-Folic Acid (IRON-C PO) Take 65 mg by mouth 1 day or 1 dose.    . gabapentin (NEURONTIN) 100 MG capsule Take 1 capsule (100 mg total) by mouth 3 (three) times daily. For shingle 270 capsule 1  . Garlic Oil 0947 MG CAPS Take 1,000 mg by mouth 1 day or 1 dose.    Marland Kitchen HYDROcodone-homatropine (HYCODAN) 5-1.5 MG/5ML syrup Take 5 mLs by mouth every 4 (four) hours as needed for cough. 120 mL 0  . Lactobacillus (PROBIOTIC ACIDOPHILUS) CAPS Take by mouth once.    . loratadine (CLARITIN) 10 MG tablet Take 10 mg by mouth daily.  3  . lovastatin (MEVACOR) 40 MG tablet Take 1 tablet (40 mg total) by mouth at bedtime.  Must keep 11/03/16 appt for future refills 90 tablet 1  . metoprolol succinate (TOPROL-XL) 50 MG 24 hr tablet Take 1 tablet (50 mg total) by mouth 2 (two) times daily. 180 tablet 1  . omeprazole (PRILOSEC) 20 MG capsule Take 1 capsule (20 mg total) by mouth daily. 90 capsule 1  . ondansetron (ZOFRAN) 4 MG tablet Take 4 mg by mouth every 8 (eight) hours as needed for nausea or vomiting.    . raloxifene (EVISTA) 60 MG tablet Take 1 tablet (60 mg total) by mouth daily. 90 tablet 1  . SYMBICORT 160-4.5 MCG/ACT inhaler Inhale 2 puffs into the lungs 2 (two) times daily. Inhale 1-2 puffs twice a day 1 Inhaler 5  . tiotropium (SPIRIVA) 18 MCG inhalation capsule Place 1 capsule (18 mcg total) into inhaler and inhale daily. 30 capsule 12  . WELCHOL 625 MG tablet Take 1 tablet (625 mg total) by mouth 2 (two) times daily with a meal. 180 tablet 1  . sodium chloride 0.9 % infusion     . vancomycin (VANCOCIN) 10 G SOLR injection      No facility-administered medications prior to visit.     ROS Review of Systems  Constitutional: Negative for chills, diaphoresis and fever.  Respiratory: Positive for wheezing. Negative for cough, sputum production and shortness  of breath.   Cardiovascular: Positive for leg swelling. Negative for chest pain and palpitations.       LE edema improved with use of compression stocking.  Skin: Negative.     Objective:  BP 134/70   Pulse 76   Temp 97.7 F (36.5 C)   Ht 5\' 1"  (1.549 m)   Wt 171 lb (77.6 kg)   SpO2 96%   BMI 32.31 kg/m   BP Readings from Last 3 Encounters:  12/23/16 134/70  12/16/16 130/77  12/08/16 130/66    Wt Readings from Last 3 Encounters:  12/23/16 171 lb (77.6 kg)  12/16/16 170 lb (77.1 kg)  12/08/16 173 lb (78.5 kg)    Physical Exam  Constitutional: She is oriented to person, place, and time. No distress.  Neck: Normal range of motion. Neck supple. No JVD present.  Cardiovascular: Normal rate, regular rhythm and normal heart sounds.     Pulmonary/Chest: Effort normal. No respiratory distress. She has wheezes. She has no rales.  Musculoskeletal: She exhibits edema. She exhibits no tenderness.  Lymphadenopathy:    She has no cervical adenopathy.  Neurological: She is alert and oriented to person, place, and time.  Skin: Skin is warm and dry. No erythema.  Vitals reviewed.   Lab Results  Component Value Date   WBC 16.0 12/01/2016   HGB 13.7 12/01/2016   HCT 44 12/01/2016   PLT 601 (A) 12/01/2016   GLUCOSE 139 (H) 11/28/2016   CHOL 198 07/29/2016   TRIG 186.0 (H) 07/29/2016   HDL 56.00 07/29/2016   LDLCALC 104 (H) 07/29/2016   ALT 19 11/25/2016   AST 23 11/25/2016   NA 144 12/01/2016   K 3.6 12/01/2016   CL 103 11/28/2016   CREATININE 0.7 12/01/2016   BUN 20 12/01/2016   CO2 28 11/28/2016   TSH 0.604 11/25/2016    Dg Chest 2 View  Result Date: 12/08/2016 CLINICAL DATA:  Follow-up pneumonia, residual cough COPD EXAM: CHEST  2 VIEW COMPARISON:  11/25/2016, 07/29/2016 FINDINGS: Right upper extremity catheter tip overlies the distal SVC. The previously noted infiltrate in the right peripheral lung and left base have resolved. There is no acute consolidation or pleural effusion. Stable cardiomegaly with atherosclerosis. No pneumothorax. IMPRESSION: 1. Clearing of previously noted infiltrates in the right peripheral lung and left base. No acute infiltrates on the current study. 2. Cardiomegaly. 3. Right upper extremity catheter tip overlies the distal SVC. Electronically Signed   By: Donavan Foil M.D.   On: 12/08/2016 14:14    Assessment & Plan:   Charles was seen today for follow-up.  Diagnoses and all orders for this visit:  HTN (hypertension), benign  Pulmonary emphysema, unspecified emphysema type ([place])  Need for vaccination with 13-polyvalent pneumococcal conjugate vaccine -     Pneumococcal conjugate vaccine 13-valent IM   I have discontinued Ms. Nipp's vancomycin. I am also having her maintain her  loratadine, diphenoxylate-atropine, ondansetron, aspirin, PROBIOTIC ACIDOPHILUS, acetaminophen, Garlic Oil, Ferrous Gluconate-C-Folic Acid (IRON-C PO), SYMBICORT, tiotropium, gabapentin, WELCHOL, lovastatin, metoprolol succinate, omeprazole, raloxifene, fenofibrate, ALPRAZolam, Knee Compression Sleeve/L/XL, HYDROcodone-homatropine, sodium chloride, albuterol, and amLODipine.  No orders of the defined types were placed in this encounter.   Follow-up: Return if symptoms worsen or fail to improve.  Wilfred Lacy, NP

## 2016-12-23 NOTE — Patient Instructions (Addendum)
Patient declined AWV visit.  Maintain upcoming appt .  Pneumococcal Vaccine, Polyvalent solution for injection What is this medicine? PNEUMOCOCCAL VACCINE, POLYVALENT (NEU mo KOK al vak SEEN, pol ee VEY luhnt) is a vaccine to prevent pneumococcus bacteria infection. These bacteria are a major cause of ear infections, Strep throat infections, and serious pneumonia, meningitis, or blood infections worldwide. These vaccines help the body to produce antibodies (protective substances) that help your body defend against these bacteria. This vaccine is recommended for people 12 years of age and older with health problems. It is also recommended for all adults over 38 years old. This vaccine will not treat an infection. This medicine may be used for other purposes; ask your health care provider or pharmacist if you have questions. COMMON BRAND NAME(S): Pneumovax 23 What should I tell my health care provider before I take this medicine? They need to know if you have any of these conditions: -bleeding problems -bone marrow or organ transplant -cancer, Hodgkin's disease -fever -infection -immune system problems -low platelet count in the blood -seizures -an unusual or allergic reaction to pneumococcal vaccine, diphtheria toxoid, other vaccines, latex, other medicines, foods, dyes, or preservatives -pregnant or trying to get pregnant -breast-feeding How should I use this medicine? This vaccine is for injection into a muscle or under the skin. It is given by a health care professional. A copy of Vaccine Information Statements will be given before each vaccination. Read this sheet carefully each time. The sheet may change frequently. Talk to your pediatrician regarding the use of this medicine in children. While this drug may be prescribed for children as young as 32 years of age for selected conditions, precautions do apply. Overdosage: If you think you have taken too much of this medicine contact a  poison control center or emergency room at once. NOTE: This medicine is only for you. Do not share this medicine with others. What if I miss a dose? It is important not to miss your dose. Call your doctor or health care professional if you are unable to keep an appointment. What may interact with this medicine? -medicines for cancer chemotherapy -medicines that suppress your immune function -medicines that treat or prevent blood clots like warfarin, enoxaparin, and dalteparin -steroid medicines like prednisone or cortisone This list may not describe all possible interactions. Give your health care provider a list of all the medicines, herbs, non-prescription drugs, or dietary supplements you use. Also tell them if you smoke, drink alcohol, or use illegal drugs. Some items may interact with your medicine. What should I watch for while using this medicine? Mild fever and pain should go away in 3 days or less. Report any unusual symptoms to your doctor or health care professional. What side effects may I notice from receiving this medicine? Side effects that you should report to your doctor or health care professional as soon as possible: -allergic reactions like skin rash, itching or hives, swelling of the face, lips, or tongue -breathing problems -confused -fever over 102 degrees F -pain, tingling, numbness in the hands or feet -seizures -unusual bleeding or bruising -unusual muscle weakness Side effects that usually do not require medical attention (report to your doctor or health care professional if they continue or are bothersome): -aches and pains -diarrhea -fever of 102 degrees F or less -headache -irritable -loss of appetite -pain, tender at site where injected -trouble sleeping This list may not describe all possible side effects. Call your doctor for medical advice about side effects. You  may report side effects to FDA at 1-800-FDA-1088. Where should I keep my medicine? This  does not apply. This vaccine is given in a clinic, pharmacy, doctor's office, or other health care setting and will not be stored at home. NOTE: This sheet is a summary. It may not cover all possible information. If you have questions about this medicine, talk to your doctor, pharmacist, or health care provider.  2018 Elsevier/Gold Standard (2008-02-18 14:32:37)

## 2016-12-24 ENCOUNTER — Telehealth: Payer: Self-pay | Admitting: Nurse Practitioner

## 2016-12-24 NOTE — Telephone Encounter (Signed)
Attempted to call pt to schedule awv. Pt did not answer. Will try to call at at a later time.

## 2016-12-26 DIAGNOSIS — Z7982 Long term (current) use of aspirin: Secondary | ICD-10-CM | POA: Diagnosis not present

## 2016-12-26 DIAGNOSIS — I10 Essential (primary) hypertension: Secondary | ICD-10-CM | POA: Diagnosis not present

## 2016-12-26 DIAGNOSIS — J1100 Influenza due to unidentified influenza virus with unspecified type of pneumonia: Secondary | ICD-10-CM | POA: Diagnosis not present

## 2016-12-26 DIAGNOSIS — M810 Age-related osteoporosis without current pathological fracture: Secondary | ICD-10-CM

## 2016-12-26 DIAGNOSIS — F419 Anxiety disorder, unspecified: Secondary | ICD-10-CM

## 2016-12-26 DIAGNOSIS — J439 Emphysema, unspecified: Secondary | ICD-10-CM | POA: Diagnosis not present

## 2016-12-26 DIAGNOSIS — K219 Gastro-esophageal reflux disease without esophagitis: Secondary | ICD-10-CM

## 2016-12-26 DIAGNOSIS — Z8673 Personal history of transient ischemic attack (TIA), and cerebral infarction without residual deficits: Secondary | ICD-10-CM

## 2016-12-26 DIAGNOSIS — E785 Hyperlipidemia, unspecified: Secondary | ICD-10-CM | POA: Diagnosis not present

## 2016-12-26 DIAGNOSIS — Z87891 Personal history of nicotine dependence: Secondary | ICD-10-CM

## 2016-12-26 DIAGNOSIS — I252 Old myocardial infarction: Secondary | ICD-10-CM

## 2017-01-06 ENCOUNTER — Telehealth: Payer: Self-pay | Admitting: Nurse Practitioner

## 2017-01-06 DIAGNOSIS — F4320 Adjustment disorder, unspecified: Secondary | ICD-10-CM

## 2017-01-06 MED ORDER — ALPRAZOLAM 0.25 MG PO TABS: ORAL_TABLET | ORAL | 0 refills | Status: DC

## 2017-01-06 NOTE — Telephone Encounter (Signed)
rx faxed to [facility] 

## 2017-01-06 NOTE — Telephone Encounter (Signed)
[facility] on [address] request refill for Alprazolam 0.25mg  tab. Please advise.

## 2017-01-15 DIAGNOSIS — H5203 Hypermetropia, bilateral: Secondary | ICD-10-CM | POA: Diagnosis not present

## 2017-02-12 ENCOUNTER — Telehealth: Payer: Self-pay | Admitting: Nurse Practitioner

## 2017-02-12 DIAGNOSIS — F4320 Adjustment disorder, unspecified: Secondary | ICD-10-CM

## 2017-02-12 NOTE — Telephone Encounter (Signed)
Pt would like a refill of ALPRAZolam (XANAX) 0.25 MG tablet

## 2017-02-16 MED ORDER — ALPRAZOLAM 0.25 MG PO TABS: ORAL_TABLET | ORAL | 0 refills | Status: DC

## 2017-02-16 NOTE — Telephone Encounter (Signed)
Pt is aware, rx faxed to [facility] as requested.

## 2017-02-16 NOTE — Telephone Encounter (Signed)
rx printed. Waiting for sig.

## 2017-02-16 NOTE — Telephone Encounter (Signed)
Controlled substance database reviewed, no incocnsistencies. Ok to print prescription with 60tabs. No refills

## 2017-02-17 ENCOUNTER — Telehealth: Payer: Self-pay | Admitting: Nurse Practitioner

## 2017-02-17 DIAGNOSIS — J439 Emphysema, unspecified: Secondary | ICD-10-CM

## 2017-02-17 MED ORDER — SYMBICORT 160-4.5 MCG/ACT IN AERO: 2.0000 | INHALATION_SPRAY | Freq: Two times a day (BID) | RESPIRATORY_TRACT | 2 refills | Status: DC

## 2017-02-17 NOTE — Telephone Encounter (Signed)
rx sent in for 3 mo--pt has an appt 04/2017.

## 2017-04-06 ENCOUNTER — Telehealth: Payer: Self-pay | Admitting: Nurse Practitioner

## 2017-04-06 MED ORDER — LORATADINE 10 MG PO TABS: 10.0000 mg | ORAL_TABLET | Freq: Every day | ORAL | 0 refills | Status: DC

## 2017-04-06 NOTE — Telephone Encounter (Signed)
loratadine (CLARITIN) 10 MG tablet  Patient is requesting a refill on this medication.   [id number] [address], [place]. [phone] (Phone) [phone] (Fax)

## 2017-04-06 NOTE — Telephone Encounter (Signed)
Reviewed chart pt is up-to-date sent refills to [facility].../lmb  

## 2017-04-16 ENCOUNTER — Telehealth: Payer: Self-pay | Admitting: Nurse Practitioner

## 2017-04-16 DIAGNOSIS — F4320 Adjustment disorder, unspecified: Secondary | ICD-10-CM

## 2017-04-16 MED ORDER — ALPRAZOLAM 0.25 MG PO TABS: ORAL_TABLET | ORAL | 0 refills | Status: DC

## 2017-04-16 NOTE — Telephone Encounter (Signed)
[facility] 

## 2017-04-16 NOTE — Telephone Encounter (Signed)
Printed and signed.  

## 2017-04-16 NOTE — Telephone Encounter (Signed)
Pt called requesting a refill on ALPRAZolam (XANAX) 0.25 MG tablet. Please advise.

## 2017-04-29 ENCOUNTER — Telehealth: Payer: Self-pay | Admitting: Nurse Practitioner

## 2017-04-29 DIAGNOSIS — B0229 Other postherpetic nervous system involvement: Secondary | ICD-10-CM

## 2017-04-29 DIAGNOSIS — I10 Essential (primary) hypertension: Secondary | ICD-10-CM

## 2017-04-29 DIAGNOSIS — R350 Frequency of micturition: Secondary | ICD-10-CM

## 2017-04-29 DIAGNOSIS — E782 Mixed hyperlipidemia: Secondary | ICD-10-CM

## 2017-04-29 DIAGNOSIS — Z780 Asymptomatic menopausal state: Secondary | ICD-10-CM

## 2017-04-29 DIAGNOSIS — M810 Age-related osteoporosis without current pathological fracture: Secondary | ICD-10-CM

## 2017-04-29 DIAGNOSIS — K529 Noninfective gastroenteritis and colitis, unspecified: Secondary | ICD-10-CM

## 2017-04-29 MED ORDER — RALOXIFENE HCL 60 MG PO TABS: 60.0000 mg | ORAL_TABLET | Freq: Every day | ORAL | 0 refills | Status: DC

## 2017-04-29 MED ORDER — METOPROLOL SUCCINATE ER 50 MG PO TB24: 50.0000 mg | ORAL_TABLET | Freq: Two times a day (BID) | ORAL | 0 refills | Status: DC

## 2017-04-29 MED ORDER — GABAPENTIN 100 MG PO CAPS: 100.0000 mg | ORAL_CAPSULE | Freq: Three times a day (TID) | ORAL | 0 refills | Status: DC

## 2017-04-29 MED ORDER — WELCHOL 625 MG PO TABS: 625.0000 mg | ORAL_TABLET | Freq: Two times a day (BID) | ORAL | 0 refills | Status: DC

## 2017-04-29 MED ORDER — LOVASTATIN 40 MG PO TABS: 40.0000 mg | ORAL_TABLET | Freq: Every day | ORAL | 0 refills | Status: DC

## 2017-04-29 NOTE — Telephone Encounter (Signed)
Per office policy sent 30 day to local pharmacy pt need f/u appt...Courtney Brown

## 2017-04-29 NOTE — Telephone Encounter (Signed)
Patient requesting refills on lovastatin, gabapentin, metoprolol, raloxifene, and welchol to be [facility] to [facility] [address]

## 2017-04-29 NOTE — Telephone Encounter (Signed)
Patient states she has appt 10/9.  States her card says to do labs before appt.  I do not have labs entered.  Patient also is requesting a urinalysis b/c she thinks she has a UTI.

## 2017-04-29 NOTE — Telephone Encounter (Signed)
Left vm for pt to call back,  Need to inform pt that urinalysis ordered and the pt can stop by the lab to get sample.   [facility] does not do lab prior to the appt. That note was just a reminder that we need to do it when pt comes in.

## 2017-04-30 NOTE — Telephone Encounter (Signed)
Spoke with pt, she is aware

## 2017-05-05 ENCOUNTER — Other Ambulatory Visit (INDEPENDENT_AMBULATORY_CARE_PROVIDER_SITE_OTHER): Payer: Medicare Other

## 2017-05-05 ENCOUNTER — Encounter: Payer: Self-pay | Admitting: Nurse Practitioner

## 2017-05-05 ENCOUNTER — Telehealth: Payer: Self-pay | Admitting: Nurse Practitioner

## 2017-05-05 ENCOUNTER — Ambulatory Visit (INDEPENDENT_AMBULATORY_CARE_PROVIDER_SITE_OTHER): Payer: Medicare Other | Admitting: Nurse Practitioner

## 2017-05-05 VITALS — BP 140/72 | HR 64 | Temp 97.6°F | Ht 61.0 in | Wt 176.0 lb

## 2017-05-05 DIAGNOSIS — I10 Essential (primary) hypertension: Secondary | ICD-10-CM

## 2017-05-05 DIAGNOSIS — I739 Peripheral vascular disease, unspecified: Secondary | ICD-10-CM

## 2017-05-05 DIAGNOSIS — B0229 Other postherpetic nervous system involvement: Secondary | ICD-10-CM

## 2017-05-05 DIAGNOSIS — K219 Gastro-esophageal reflux disease without esophagitis: Secondary | ICD-10-CM | POA: Diagnosis not present

## 2017-05-05 DIAGNOSIS — R300 Dysuria: Secondary | ICD-10-CM | POA: Diagnosis not present

## 2017-05-05 DIAGNOSIS — F4323 Adjustment disorder with mixed anxiety and depressed mood: Secondary | ICD-10-CM

## 2017-05-05 DIAGNOSIS — M810 Age-related osteoporosis without current pathological fracture: Secondary | ICD-10-CM

## 2017-05-05 DIAGNOSIS — E782 Mixed hyperlipidemia: Secondary | ICD-10-CM | POA: Diagnosis not present

## 2017-05-05 DIAGNOSIS — Z23 Encounter for immunization: Secondary | ICD-10-CM | POA: Diagnosis not present

## 2017-05-05 DIAGNOSIS — J439 Emphysema, unspecified: Secondary | ICD-10-CM

## 2017-05-05 DIAGNOSIS — N3000 Acute cystitis without hematuria: Secondary | ICD-10-CM

## 2017-05-05 LAB — BASIC METABOLIC PANEL
BUN: 21 mg/dL (ref 6–23)
CALCIUM: 9.2 mg/dL (ref 8.4–10.5)
CO2: 29 meq/L (ref 19–32)
CREATININE: 0.85 mg/dL (ref 0.40–1.20)
Chloride: 104 mEq/L (ref 96–112)
GFR: 69.16 mL/min (ref >60.00)
Glucose, Bld: 100 mg/dL — ABNORMAL HIGH (ref 70–99)
Potassium: 3.9 mEq/L (ref 3.5–5.1)
SODIUM: 142 meq/L (ref 135–145)

## 2017-05-05 LAB — URINALYSIS WITH CULTURE, IF INDICATED
BILIRUBIN URINE: NEGATIVE
HGB URINE DIPSTICK: NEGATIVE
KETONES UR: NEGATIVE
NITRITE: NEGATIVE
PH: 6 (ref 5.0–8.0)
RBC / HPF: NONE SEEN (ref 0–?)
Specific Gravity, Urine: 1.02 (ref 1.000–1.030)
Total Protein, Urine: NEGATIVE
Urine Glucose: NEGATIVE
Urobilinogen, UA: 0.2 (ref 0.0–1.0)

## 2017-05-05 LAB — HEPATIC FUNCTION PANEL
ALBUMIN: 4 g/dL (ref 3.5–5.2)
ALK PHOS: 35 U/L — AB (ref 39–117)
ALT: 18 U/L (ref 0–35)
AST: 18 U/L (ref 0–37)
Bilirubin, Direct: 0.1 mg/dL (ref 0.0–0.3)
TOTAL PROTEIN: 7 g/dL (ref 6.0–8.3)
Total Bilirubin: 0.5 mg/dL (ref 0.2–1.2)

## 2017-05-05 LAB — LIPID PANEL
CHOLESTEROL: 156 mg/dL (ref 0–200)
HDL: 53.8 mg/dL (ref >39.00)
LDL Cholesterol: 70 mg/dL (ref 0–99)
NONHDL: 101.79
TRIGLYCERIDES: 161 mg/dL — AB (ref 0.0–149.0)
Total CHOL/HDL Ratio: 3
VLDL: 32.2 mg/dL (ref 0.0–40.0)

## 2017-05-05 LAB — TSH: TSH: 3.65 u[IU]/mL (ref 0.35–4.50)

## 2017-05-05 MED ORDER — CIPROFLOXACIN HCL 250 MG PO TABS: 250.0000 mg | ORAL_TABLET | Freq: Two times a day (BID) | ORAL | 0 refills | Status: DC

## 2017-05-05 MED ORDER — GABAPENTIN 100 MG PO CAPS: 100.0000 mg | ORAL_CAPSULE | Freq: Three times a day (TID) | ORAL | 5 refills | Status: DC

## 2017-05-05 MED ORDER — CITALOPRAM HYDROBROMIDE 10 MG PO TABS: 10.0000 mg | ORAL_TABLET | Freq: Every day | ORAL | 2 refills | Status: DC

## 2017-05-05 MED ORDER — WELCHOL 625 MG PO TABS: 625.0000 mg | ORAL_TABLET | Freq: Two times a day (BID) | ORAL | 1 refills | Status: DC

## 2017-05-05 MED ORDER — FUROSEMIDE 20 MG PO TABS: 20.0000 mg | ORAL_TABLET | Freq: Every day | ORAL | 1 refills | Status: DC | PRN

## 2017-05-05 MED ORDER — LOVASTATIN 40 MG PO TABS: 40.0000 mg | ORAL_TABLET | Freq: Every day | ORAL | 1 refills | Status: DC

## 2017-05-05 MED ORDER — METOPROLOL SUCCINATE ER 50 MG PO TB24: 50.0000 mg | ORAL_TABLET | Freq: Two times a day (BID) | ORAL | 1 refills | Status: DC

## 2017-05-05 MED ORDER — FENOFIBRATE 145 MG PO TABS: 145.0000 mg | ORAL_TABLET | Freq: Every day | ORAL | 1 refills | Status: DC

## 2017-05-05 MED ORDER — TIOTROPIUM BROMIDE MONOHYDRATE 18 MCG IN CAPS: 18.0000 ug | ORAL_CAPSULE | Freq: Every day | RESPIRATORY_TRACT | 12 refills | Status: DC

## 2017-05-05 MED ORDER — RALOXIFENE HCL 60 MG PO TABS: 60.0000 mg | ORAL_TABLET | Freq: Every day | ORAL | 1 refills | Status: DC

## 2017-05-05 MED ORDER — SYMBICORT 160-4.5 MCG/ACT IN AERO: 2.0000 | INHALATION_SPRAY | Freq: Two times a day (BID) | RESPIRATORY_TRACT | 5 refills | Status: DC

## 2017-05-05 MED ORDER — AMLODIPINE BESYLATE 10 MG PO TABS: 10.0000 mg | ORAL_TABLET | Freq: Every day | ORAL | 1 refills | Status: DC

## 2017-05-05 NOTE — Patient Instructions (Addendum)
I instructed pt to start 1/2 tablet once daily for 1 week and then increase to a full tablet once daily on week two as tolerated.   We discussed common side effects such as nausea, drowsiness and weight gain.  Also discussed rare but serious side effect of suicide ideation.  She is instructed to discontinue medication go directly to ED if this occurs.  Pt verbalizes understanding.    Stable lab results. Continue medications as prescribed F/up in 108months with new NP. Urinalysis indicates possible UTI. Sent oral abx  And urine sent for culture.

## 2017-05-05 NOTE — Telephone Encounter (Signed)
Pharmacy called and states  ciprofloxacin (CIPRO) 250 MG tablet And  citalopram (CELEXA) 10 MG tablet  Interact with each other and may cause life threatening irregular heart rhythms  Please advise and call back in regard    [place] from pharmacy

## 2017-05-05 NOTE — Progress Notes (Signed)
Subjective:  Patient ID: Courtney Brown, female    DOB: [birth year]  Age: 76 y.o. MRN: [id number]  CC: Follow-up (6 mo fu--leg still swelling--cant wear compression sock/ second pneumonia shot? flu shot?) and Back Pain (sore,swelling,burning when urinate)   Back Pain  This is a new problem. The current episode started 1 to 4 weeks ago. The problem occurs constantly. The problem is unchanged. The pain is present in the lumbar spine. The quality of the pain is described as aching. The pain does not radiate. Associated symptoms include dysuria. Pertinent negatives include no abdominal pain, bladder incontinence, bowel incontinence, chest pain, fever, headaches, leg pain, numbness, paresis, paresthesias, pelvic pain, perianal numbness, tingling, weakness or weight loss. Risk factors include sedentary lifestyle, poor posture, menopause, lack of exercise and obesity. She has tried nothing for the symptoms.  Anxiety  Presents for follow-up visit. Symptoms include depressed mood, excessive worry, insomnia, irritability, muscle tension, nervous/anxious behavior, palpitations, panic and restlessness. Patient reports no chest pain, compulsions, confusion, decreased concentration, dizziness, dry mouth, feeling of choking, hyperventilation, impotence, malaise, nausea, shortness of breath or suicidal ideas. Symptoms occur most days. The severity of symptoms is interfering with daily activities. The quality of sleep is fair.   Compliance with medications is 76-100%. Treatment side effects: none.  current use of xanax at HS prn.  HTN: Stable with amlodipine and metoprolol. BP Readings from Last 3 Encounters:  05/05/17 140/72  12/23/16 134/70  12/16/16 130/77   Hyperlipidemia: No adverse effects with tricor, lovastatin and welchol.  LE edema: Chronic, unable to use compression stocking.  Outpatient Medications Prior to Visit  Medication Sig Dispense Refill  . acetaminophen (TYLENOL) 500 MG tablet Take  500 mg by mouth every 6 (six) hours as needed.    Marland Kitchen albuterol (PROAIR HFA) 108 (90 Base) MCG/ACT inhaler Inhale 1-2 puffs into the lungs every 6 (six) hours as needed for wheezing or shortness of breath. 1 Inhaler 5  . ALPRAZolam (XANAX) 0.25 MG tablet TAKE 1 TABLET AT BEDTIME AS NEEDED FOR ANXIETY 60 tablet 0  . aspirin 81 MG tablet Take 81 mg by mouth daily.    . diphenoxylate-atropine (LOMOTIL) 2.5-0.025 MG tablet Take by mouth 4 (four) times daily as needed for diarrhea or loose stools.    Water engineer Bandages & Supports (KNEE COMPRESSION SLEEVE/L/XL) MISC 1 Units by Does not apply route daily. 1 each 0  . Ferrous Gluconate-C-Folic Acid (IRON-C PO) Take 65 mg by mouth 1 day or 1 dose.    . Garlic Oil 0814 MG CAPS Take 1,000 mg by mouth 1 day or 1 dose.    Marland Kitchen HYDROcodone-homatropine (HYCODAN) 5-1.5 MG/5ML syrup Take 5 mLs by mouth every 4 (four) hours as needed for cough. 120 mL 0  . Lactobacillus (PROBIOTIC ACIDOPHILUS) CAPS Take by mouth once.    . loratadine (CLARITIN) 10 MG tablet Take 1 tablet (10 mg total) by mouth daily. 90 tablet 0  . omeprazole (PRILOSEC) 20 MG capsule Take 1 capsule (20 mg total) by mouth daily. 90 capsule 1  . ondansetron (ZOFRAN) 4 MG tablet Take 4 mg by mouth every 8 (eight) hours as needed for nausea or vomiting.    . sodium chloride 0.9 % infusion     . amLODipine (NORVASC) 10 MG tablet Take 1 tablet (10 mg total) by mouth at bedtime. Must keep 11/03/16 appt for future refills 90 tablet 1  . fenofibrate (TRICOR) 145 MG tablet Take 1 tablet (145 mg total) by mouth daily. 90 tablet  1  . gabapentin (NEURONTIN) 100 MG capsule Take 1 capsule (100 mg total) by mouth 3 (three) times daily. F/U appt due in Nov must see provider for future refills 60 capsule 0  . lovastatin (MEVACOR) 40 MG tablet Take 1 tablet (40 mg total) by mouth at bedtime. F/u appt due in Nov must see provider for future refills 30 tablet 0  . metoprolol succinate (TOPROL-XL) 50 MG 24 hr tablet Take 1  tablet (50 mg total) by mouth 2 (two) times daily. F/u appt due in Nov must see provider for future refills 60 tablet 0  . raloxifene (EVISTA) 60 MG tablet Take 1 tablet (60 mg total) by mouth daily. F/u appt due in Nov must see provider for future refills 30 tablet 0  . SYMBICORT 160-4.5 MCG/ACT inhaler Inhale 2 puffs into the lungs 2 (two) times daily. Inhale 1-2 puffs twice a day 1 Inhaler 2  . tiotropium (SPIRIVA) 18 MCG inhalation capsule Place 1 capsule (18 mcg total) into inhaler and inhale daily. 30 capsule 12  . WELCHOL 625 MG tablet Take 1 tablet (625 mg total) by mouth 2 (two) times daily with a meal. F/u appt due in Nov must see provider for future refills 60 tablet 0   No facility-administered medications prior to visit.     ROS See HPI  Objective:  BP 140/72   Pulse 64   Temp 97.6 F (36.4 C)   Ht 5\' 1"  (1.549 m)   Wt 176 lb (79.8 kg)   SpO2 96%   BMI 33.25 kg/m   BP Readings from Last 3 Encounters:  05/05/17 140/72  12/23/16 134/70  12/16/16 130/77    Wt Readings from Last 3 Encounters:  05/05/17 176 lb (79.8 kg)  12/23/16 171 lb (77.6 kg)  12/16/16 170 lb (77.1 kg)    Physical Exam  Lab Results  Component Value Date   WBC 16.0 12/01/2016   HGB 13.7 12/01/2016   HCT 44 12/01/2016   PLT 601 (A) 12/01/2016   GLUCOSE 100 (H) 05/05/2017   CHOL 156 05/05/2017   TRIG 161.0 (H) 05/05/2017   HDL 53.80 05/05/2017   LDLCALC 70 05/05/2017   ALT 18 05/05/2017   AST 18 05/05/2017   NA 142 05/05/2017   K 3.9 05/05/2017   CL 104 05/05/2017   CREATININE 0.85 05/05/2017   BUN 21 05/05/2017   CO2 29 05/05/2017   TSH 3.65 05/05/2017    Dg Chest 2 View  Result Date: 12/08/2016 CLINICAL DATA:  Follow-up pneumonia, residual cough COPD EXAM: CHEST  2 VIEW COMPARISON:  11/25/2016, 07/29/2016 FINDINGS: Right upper extremity catheter tip overlies the distal SVC. The previously noted infiltrate in the right peripheral lung and left base have resolved. There is no  acute consolidation or pleural effusion. Stable cardiomegaly with atherosclerosis. No pneumothorax. IMPRESSION: 1. Clearing of previously noted infiltrates in the right peripheral lung and left base. No acute infiltrates on the current study. 2. Cardiomegaly. 3. Right upper extremity catheter tip overlies the distal SVC. Electronically Signed   By: Donavan Foil M.D.   On: 12/08/2016 14:14    Assessment & Plan:   Charizma was seen today for follow-up and back pain.  Diagnoses and all orders for this visit:  HTN (hypertension), benign -     Basic metabolic panel; Future -     amLODipine (NORVASC) 10 MG tablet; Take 1 tablet (10 mg total) by mouth at bedtime. Must keep 11/03/16 appt for future refills -  metoprolol succinate (TOPROL-XL) 50 MG 24 hr tablet; Take 1 tablet (50 mg total) by mouth 2 (two) times daily.  PVD (peripheral vascular disease) (HCC) -     furosemide (LASIX) 20 MG tablet; Take 1 tablet (20 mg total) by mouth daily as needed. For LE edema  Adjustment disorder with mixed anxiety and depressed mood -     citalopram (CELEXA) 10 MG tablet; Take 1 tablet (10 mg total) by mouth daily. -     TSH; Future  Gastroesophageal reflux disease without esophagitis  Mixed hyperlipidemia -     Hepatic function panel; Future -     Lipid panel; Future -     fenofibrate (TRICOR) 145 MG tablet; Take 1 tablet (145 mg total) by mouth daily. -     lovastatin (MEVACOR) 40 MG tablet; Take 1 tablet (40 mg total) by mouth at bedtime. -     WELCHOL 625 MG tablet; Take 1 tablet (625 mg total) by mouth 2 (two) times daily with a meal.  Dysuria -     Urinalysis with Culture, if indicated; Future -     ciprofloxacin (CIPRO) 250 MG tablet; Take 1 tablet (250 mg total) by mouth 2 (two) times daily.  Need for influenza vaccination -     Flu vaccine HIGH DOSE PF  Post herpetic neuralgia -     gabapentin (NEURONTIN) 100 MG capsule; Take 1 capsule (100 mg total) by mouth 3 (three) times  daily.  Pulmonary emphysema, unspecified emphysema type ([place]) -     tiotropium (SPIRIVA) 18 MCG inhalation capsule; Place 1 capsule (18 mcg total) into inhaler and inhale daily. -     SYMBICORT 160-4.5 MCG/ACT inhaler; Inhale 2 puffs into the lungs 2 (two) times daily.  Age related osteoporosis, unspecified pathological fracture presence -     raloxifene (EVISTA) 60 MG tablet; Take 1 tablet (60 mg total) by mouth daily.   I have changed Ms. Knightly's gabapentin, SYMBICORT, lovastatin, metoprolol succinate, raloxifene, and WELCHOL. I am also having her start on furosemide, citalopram, and ciprofloxacin. Additionally, I am having her maintain her diphenoxylate-atropine, ondansetron, aspirin, PROBIOTIC ACIDOPHILUS, acetaminophen, Garlic Oil, Ferrous Gluconate-C-Folic Acid (IRON-C PO), omeprazole, Knee Compression Sleeve/L/XL, HYDROcodone-homatropine, sodium chloride, albuterol, loratadine, ALPRAZolam, amLODipine, fenofibrate, and tiotropium.  Meds ordered this encounter  Medications  . furosemide (LASIX) 20 MG tablet    Sig: Take 1 tablet (20 mg total) by mouth daily as needed. For LE edema    Dispense:  30 tablet    Refill:  1    Order Specific Question:   Supervising Provider    Answer:   Cassandria Anger [[id number]]  . citalopram (CELEXA) 10 MG tablet    Sig: Take 1 tablet (10 mg total) by mouth daily.    Dispense:  30 tablet    Refill:  2    Order Specific Question:   Supervising Provider    Answer:   Cassandria Anger [[id number]]  . amLODipine (NORVASC) 10 MG tablet    Sig: Take 1 tablet (10 mg total) by mouth at bedtime. Must keep 11/03/16 appt for future refills    Dispense:  90 tablet    Refill:  1    Order Specific Question:   Supervising Provider    Answer:   Cassandria Anger [[id number]]  . gabapentin (NEURONTIN) 100 MG capsule    Sig: Take 1 capsule (100 mg total) by mouth 3 (three) times daily.    Dispense:  60 capsule    Refill:  5    Order Specific Question:   Supervising  Provider    Answer:   Cassandria Anger [[id number]]  . fenofibrate (TRICOR) 145 MG tablet    Sig: Take 1 tablet (145 mg total) by mouth daily.    Dispense:  90 tablet    Refill:  1    Order Specific Question:   Supervising Provider    Answer:   Cassandria Anger [[id number]]  . tiotropium (SPIRIVA) 18 MCG inhalation capsule    Sig: Place 1 capsule (18 mcg total) into inhaler and inhale daily.    Dispense:  30 capsule    Refill:  12    Order Specific Question:   Supervising Provider    Answer:   Cassandria Anger [[id number]]  . SYMBICORT 160-4.5 MCG/ACT inhaler    Sig: Inhale 2 puffs into the lungs 2 (two) times daily.    Dispense:  1 Inhaler    Refill:  5    Order Specific Question:   Supervising Provider    Answer:   Cassandria Anger [[id number]]  . lovastatin (MEVACOR) 40 MG tablet    Sig: Take 1 tablet (40 mg total) by mouth at bedtime.    Dispense:  90 tablet    Refill:  1    Order Specific Question:   Supervising Provider    Answer:   Cassandria Anger [[id number]]  . metoprolol succinate (TOPROL-XL) 50 MG 24 hr tablet    Sig: Take 1 tablet (50 mg total) by mouth 2 (two) times daily.    Dispense:  180 tablet    Refill:  1    Order Specific Question:   Supervising Provider    Answer:   Cassandria Anger [[id number]]  . raloxifene (EVISTA) 60 MG tablet    Sig: Take 1 tablet (60 mg total) by mouth daily.    Dispense:  90 tablet    Refill:  1    Order Specific Question:   Supervising Provider    Answer:   Cassandria Anger [[id number]]  . WELCHOL 625 MG tablet    Sig: Take 1 tablet (625 mg total) by mouth 2 (two) times daily with a meal.    Dispense:  180 tablet    Refill:  1    Order Specific Question:   Supervising Provider    Answer:   Cassandria Anger [[id number]]  . ciprofloxacin (CIPRO) 250 MG tablet    Sig: Take 1 tablet (250 mg total) by mouth 2 (two) times daily.    Dispense:  10 tablet    Refill:  0    Order Specific Question:   Supervising Provider    Answer:   Cassandria Anger [[id number]]    Follow-up: Return in about 3 months (around 08/05/2017) for with new NP.  Wilfred Lacy, NP

## 2017-05-06 LAB — URINE CULTURE
MICRO NUMBER: 81123120
SPECIMEN QUALITY:: ADEQUATE

## 2017-05-06 MED ORDER — NITROFURANTOIN MONOHYD MACRO 100 MG PO CAPS: 100.0000 mg | ORAL_CAPSULE | Freq: Two times a day (BID) | ORAL | 0 refills | Status: AC

## 2017-05-07 ENCOUNTER — Telehealth: Payer: Self-pay | Admitting: Nurse Practitioner

## 2017-05-07 NOTE — Telephone Encounter (Signed)
Patient requesting call back in regard to medications she was prescribed by [facility] on her last OV.

## 2017-05-07 NOTE — Telephone Encounter (Signed)
Spoke with pt, advise pt that we sent in new abx call nitrofurantoin (macrobid) since Cipro interact with celexa. Pt verbalize understand. See previous phone note.

## 2017-06-09 ENCOUNTER — Telehealth: Payer: Self-pay | Admitting: Nurse Practitioner

## 2017-06-09 DIAGNOSIS — J439 Emphysema, unspecified: Secondary | ICD-10-CM

## 2017-06-09 DIAGNOSIS — K219 Gastro-esophageal reflux disease without esophagitis: Secondary | ICD-10-CM

## 2017-06-09 MED ORDER — OMEPRAZOLE 20 MG PO CPDR: 20.0000 mg | DELAYED_RELEASE_CAPSULE | Freq: Every day | ORAL | 0 refills | Status: DC

## 2017-06-09 MED ORDER — SYMBICORT 160-4.5 MCG/ACT IN AERO: 2.0000 | INHALATION_SPRAY | Freq: Two times a day (BID) | RESPIRATORY_TRACT | 2 refills | Status: DC

## 2017-06-09 NOTE — Telephone Encounter (Signed)
Pt called and needs a refill of her  ALPRAZolam (XANAX) 0.25 MG tablet omeprazole (PRILOSEC) 20 MG capsule SYMBICORT 160-4.5 MCG/ACT inhaler Please advise  POF

## 2017-06-09 NOTE — Telephone Encounter (Signed)
Not due until 06/19/17

## 2017-06-09 NOTE — Telephone Encounter (Signed)
Sent maintenance pls advise on alprazolam.../lmb

## 2017-06-25 ENCOUNTER — Telehealth: Payer: Self-pay | Admitting: Nurse Practitioner

## 2017-06-25 DIAGNOSIS — I739 Peripheral vascular disease, unspecified: Secondary | ICD-10-CM

## 2017-06-25 DIAGNOSIS — F4320 Adjustment disorder, unspecified: Secondary | ICD-10-CM

## 2017-06-25 NOTE — Telephone Encounter (Signed)
Patient is calling to inquire about her Xanax prescription- it was supposed to be refilled after 11/23. She also had inquires about the Lasix- she doesn't think it is working- she still has swelling in her feet and leg. Patient states she still has swelling whatever she is doing. She doesn't know if it is working as it should or if it is strong enough. Told patient that may require an appointment to discuss an increase in dosage. Patient does have an appointment to establish care with Brien Few, NP on 07/30/2017. Please let patient know about her refill. [phone]

## 2017-06-25 NOTE — Telephone Encounter (Signed)
Left message for pt to call back with symptoms why she thinks the Lasix needs to be increased and/or is not working.

## 2017-06-25 NOTE — Telephone Encounter (Signed)
Copied from [place] [phone]. Topic: Quick Communication - See Telephone Encounter >> Jun 25, 2017 10:33 AM Cleaster Corin, NT wrote: CRM for notification. See Telephone encounter for:   06/25/17. Pt. Would like to get a refill on her lasix 20mg  but would also know if she can get a higher dosage she doesn't fell like its working. Please give pt. A call with any questions at [phone].   [id number] [address], [place] [address] Phone: [phone] Fax: [phone] Not a 24 hour pharmacy; exact hours not known

## 2017-06-26 MED ORDER — FUROSEMIDE 20 MG PO TABS: 20.0000 mg | ORAL_TABLET | Freq: Every day | ORAL | 0 refills | Status: DC

## 2017-06-26 MED ORDER — ALPRAZOLAM 0.25 MG PO TABS: ORAL_TABLET | ORAL | 0 refills | Status: DC

## 2017-06-26 NOTE — Telephone Encounter (Signed)
Pt has an appointment to reestablish.

## 2017-06-26 NOTE — Telephone Encounter (Signed)
See next call- sent to office

## 2017-06-29 ENCOUNTER — Telehealth: Payer: Self-pay | Admitting: Nurse Practitioner

## 2017-06-29 NOTE — Telephone Encounter (Signed)
Copied from [place]. Topic: Quick Communication - See Telephone Encounter >> Jun 29, 2017  2:08 PM Percell Belt A wrote: CRM for notification. See Telephone encounter for: pt called in and said that pharmacy still doent have her xanex. Per note it was going to be faxed on the 29th?   Pharmacy - [facility] on [address]  06/29/17.

## 2017-06-29 NOTE — Telephone Encounter (Signed)
[facility] ref[facility]ed to [facility] request.

## 2017-07-02 NOTE — Telephone Encounter (Signed)
Spoke with [facility] they said they never got the written rx that faxed in 06/26/17 and 06/29/2017.   [facility]ed [facility] rep. Left vm inform the pt.

## 2017-07-02 NOTE — Telephone Encounter (Signed)
Pt is calling stating that rx has been denied. She is out of her medication.  [facility] [address]  cb number is  [phone]

## 2017-07-30 ENCOUNTER — Other Ambulatory Visit (INDEPENDENT_AMBULATORY_CARE_PROVIDER_SITE_OTHER): Payer: Medicare Other

## 2017-07-30 ENCOUNTER — Encounter: Payer: Self-pay | Admitting: Nurse Practitioner

## 2017-07-30 ENCOUNTER — Ambulatory Visit (INDEPENDENT_AMBULATORY_CARE_PROVIDER_SITE_OTHER): Payer: Medicare Other | Admitting: Nurse Practitioner

## 2017-07-30 VITALS — BP 144/76 | HR 70 | Temp 98.2°F | Resp 18 | Ht 61.0 in | Wt 171.0 lb

## 2017-07-30 DIAGNOSIS — F4323 Adjustment disorder with mixed anxiety and depressed mood: Secondary | ICD-10-CM

## 2017-07-30 DIAGNOSIS — K219 Gastro-esophageal reflux disease without esophagitis: Secondary | ICD-10-CM | POA: Diagnosis not present

## 2017-07-30 DIAGNOSIS — I10 Essential (primary) hypertension: Secondary | ICD-10-CM | POA: Diagnosis not present

## 2017-07-30 DIAGNOSIS — M810 Age-related osteoporosis without current pathological fracture: Secondary | ICD-10-CM

## 2017-07-30 DIAGNOSIS — R300 Dysuria: Secondary | ICD-10-CM

## 2017-07-30 DIAGNOSIS — E782 Mixed hyperlipidemia: Secondary | ICD-10-CM

## 2017-07-30 DIAGNOSIS — B0229 Other postherpetic nervous system involvement: Secondary | ICD-10-CM

## 2017-07-30 DIAGNOSIS — J449 Chronic obstructive pulmonary disease, unspecified: Secondary | ICD-10-CM | POA: Diagnosis not present

## 2017-07-30 DIAGNOSIS — I739 Peripheral vascular disease, unspecified: Secondary | ICD-10-CM | POA: Diagnosis not present

## 2017-07-30 LAB — URINALYSIS, ROUTINE W REFLEX MICROSCOPIC
Bilirubin Urine: NEGATIVE
Hgb urine dipstick: NEGATIVE
KETONES UR: NEGATIVE
Nitrite: NEGATIVE
PH: 6 (ref 5.0–8.0)
RBC / HPF: NONE SEEN (ref 0–?)
SPECIFIC GRAVITY, URINE: 1.01 (ref 1.000–1.030)
TOTAL PROTEIN, URINE-UPE24: NEGATIVE
UROBILINOGEN UA: 0.2 (ref 0.0–1.0)
Urine Glucose: NEGATIVE

## 2017-07-30 MED ORDER — SYMBICORT 160-4.5 MCG/ACT IN AERO: 2.0000 | INHALATION_SPRAY | Freq: Two times a day (BID) | RESPIRATORY_TRACT | 2 refills | Status: DC

## 2017-07-30 MED ORDER — OMEPRAZOLE 20 MG PO CPDR: 20.0000 mg | DELAYED_RELEASE_CAPSULE | Freq: Every day | ORAL | 0 refills | Status: DC

## 2017-07-30 MED ORDER — ALPRAZOLAM 0.25 MG PO TABS: ORAL_TABLET | ORAL | 0 refills | Status: DC

## 2017-07-30 MED ORDER — COLESEVELAM HCL 625 MG PO TABS: 625.0000 mg | ORAL_TABLET | Freq: Two times a day (BID) | ORAL | 1 refills | Status: DC

## 2017-07-30 MED ORDER — GABAPENTIN 100 MG PO CAPS: 100.0000 mg | ORAL_CAPSULE | Freq: Three times a day (TID) | ORAL | 2 refills | Status: DC

## 2017-07-30 MED ORDER — LOVASTATIN 40 MG PO TABS: 40.0000 mg | ORAL_TABLET | Freq: Every day | ORAL | 1 refills | Status: DC

## 2017-07-30 MED ORDER — MOMETASONE FUROATE 50 MCG/ACT NA SUSP: 2.0000 | Freq: Every day | NASAL | 1 refills | Status: DC

## 2017-07-30 MED ORDER — ALBUTEROL SULFATE HFA 108 (90 BASE) MCG/ACT IN AERS: 1.0000 | INHALATION_SPRAY | Freq: Four times a day (QID) | RESPIRATORY_TRACT | 5 refills | Status: DC | PRN

## 2017-07-30 MED ORDER — CITALOPRAM HYDROBROMIDE 20 MG PO TABS: 20.0000 mg | ORAL_TABLET | Freq: Every day | ORAL | 1 refills | Status: DC

## 2017-07-30 MED ORDER — METOPROLOL SUCCINATE ER 50 MG PO TB24: 50.0000 mg | ORAL_TABLET | Freq: Two times a day (BID) | ORAL | 1 refills | Status: DC

## 2017-07-30 MED ORDER — AMLODIPINE BESYLATE 10 MG PO TABS: 10.0000 mg | ORAL_TABLET | Freq: Every day | ORAL | 1 refills | Status: DC

## 2017-07-30 MED ORDER — RALOXIFENE HCL 60 MG PO TABS: 60.0000 mg | ORAL_TABLET | Freq: Every day | ORAL | 1 refills | Status: DC

## 2017-07-30 MED ORDER — FUROSEMIDE 20 MG PO TABS: 20.0000 mg | ORAL_TABLET | Freq: Every day | ORAL | 0 refills | Status: DC

## 2017-07-30 NOTE — Patient Instructions (Addendum)
We have printed prescriptions for you as requested.  For your cough, please try 1-2 puffs every 6 hours for the next three days on a scheduled basis. Then you may return to using the albuterol inhaler as needed. If you start to feel bad, develop fevers or chills, or your symptoms get worse please follow up for further treatment.  For your anxiety, please increase your celexa to 20 mg once daily. We will continue your xanax once daily at bedtime for now.  I have ordered a urine test downstairs in the lab to check for infection.  Id like to see you back in about 1 month, to see how you are feeling on the increased celexa dosage.  It was nice to meet you. Thanks for letting me take care of you today :)

## 2017-07-30 NOTE — Progress Notes (Signed)
Subjective:    Patient ID: Courtney Brown, female    DOB: February 05, 1941, 77 y.o.   MRN: [id number]  HPI Ms Courtney Brown presents today to establish care. She Is transferring to me from another provider in the same clinic. She Has the following significant medical problems: hypertension, CAD, PVD, COPD, GERD, colon polyps, postherpetic neuralgia, osteoporosis, hyperlipidemia, adjustment disorder.  Hyperlipidemia- maintained on welchol, fenofibrate, lovastatin. States she takes her medications daily and tolerates them well. She is requesting generic rx for welchol today for affordability. Reports daily medication compliance without adverse medication effects including myalgias. Diet- eats fairly well- vegetables most days; wheat products; rare sweets; small portions of meat Exercise- not routinely; normal ADLs only.  Lab Results  Component Value Date   CHOL 156 05/05/2017   HDL 53.80 05/05/2017   LDLCALC 70 05/05/2017   TRIG 161.0 (H) 05/05/2017   CHOLHDL 3 05/05/2017    Hypertension -maintained on amlodipine 10, toprol XL 50 BID Reports daily medication compliance without adverse medication effects. She actually did not take her medication this am only because she wanted to remain fasting for her clinic appointment. Reports occasionally checks BP at home with readings in 140s/60-70s. Denies headaches, vision changes, chest pain. She does have intermittent  BLE edema- worse at night- which has not worsened over time.  BP Readings from Last 3 Encounters:  07/30/17 (!) 144/76  05/05/17 140/72  12/23/16 134/70   Anxiety-started on celexa in addition to xanax prn at her last visit (10/9). She has been taking the celexa and feels she is tolerating it well. She continues to take xanax at bedtime. She does continue to have some daily stress and worry related to to being the primary caregiver for her mother, but can tell her anxiety is overall improving. She denies restless, confusion, dizziness,  thoughts of hurting herself or others.  Emphysema- maintined on spiriva once daily, symicort bid, albuterol prn. Pulmonary function test on 10/29/16 consistent with severe COPD. Uses albuterol once to a few times a week, no more than normal. She does report increased cough with thick white sputum for about the past 1 week. She denies fevers, chills, sinus pressure, nasal congesiton, wirsening shortness of breath, palpitations. she overall fells well.  Dysuria- This is a recurrent problem. She was treated for a UTI in October. She continues to have some intermittent dysuria since. She reports urinary frequency. She denies nausea, vomiting, flank pain, pelvic pain, dysuria, vaginal discharge or bleeding. She had been following with a urologist in the past but has not been in many years  Review of Systems  See HPI  Past Medical History:  Diagnosis Date  . Allergy   . Anxiety   . Colon polyps   . COPD (chronic obstructive pulmonary disease) ([place])   . Emphysema of lung ([place])   . GERD (gastroesophageal reflux disease)   . H/O hiatal hernia   . Hyperlipidemia   . Hypertension   . Myocardial infarct, old 2008   no stent placed  . Osteoporosis   . Stroke [facility])    TIA     Social History   Socioeconomic History  . Marital status: Widowed    Spouse name: Not on file  . Number of children: Not on file  . Years of education: Not on file  . Highest education level: Not on file  Social Needs  . Financial resource strain: Not on file  . Food insecurity - worry: Not on file  . Food insecurity - inability: Not  on file  . Transportation needs - medical: Not on file  . Transportation needs - non-medical: Not on file  Occupational History  . Not on file  Tobacco Use  . Smoking status: Former Smoker    Packs/day: 1.50    Years: 15.00    Pack years: 22.50  . Smokeless tobacco: Never Used  . Tobacco comment: stop 04/05/2006  Substance and Sexual Activity  . Alcohol use: Yes     Alcohol/week: 1.2 oz    Types: 2 Shots of liquor per week    Comment: social, weekly  . Drug use: No  . Sexual activity: Not Currently  Other Topics Concern  . Not on file  Social History Narrative  . Not on file    Past Surgical History:  Procedure Laterality Date  . ABDOMINAL HYSTERECTOMY  1998  . 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 HERNIA REPAIR     umbilical, ventral  . INSERTION OF ILIAC STENT  2008    Family History  Problem Relation Age of Onset  . Hearing loss Father   . Hearing loss Maternal Aunt   . Hearing loss Maternal Grandmother   . Cancer Maternal Grandmother        colon  . Arthritis Mother   . Cancer Paternal Grandmother        colon    Allergies  Allergen Reactions  . Prednisone Other (See Comments)    Leg pain,light headed    Current Outpatient Medications on File Prior to Visit  Medication Sig Dispense Refill  . acetaminophen (TYLENOL) 500 MG tablet Take 500 mg by mouth every 6 (six) hours as needed.    Marland Kitchen albuterol (PROAIR HFA) 108 (90 Base) MCG/ACT inhaler Inhale 1-2 puffs into the lungs every 6 (six) hours as needed for wheezing or shortness of breath. 1 Inhaler 5  . ALPRAZolam (XANAX) 0.25 MG tablet TAKE 1 TABLET AT BEDTIME AS NEEDED FOR ANXIETY 30 tablet 0  . amLODipine (NORVASC) 10 MG tablet Take 1 tablet (10 mg total) by mouth at bedtime. Must keep 11/03/16 appt for future refills 90 tablet 1  . aspirin 81 MG tablet Take 81 mg by mouth daily.    . citalopram (CELEXA) 10 MG tablet Take 1 tablet (10 mg total) by mouth daily. 30 tablet 2  . fenofibrate (TRICOR) 145 MG tablet Take 1 tablet (145 mg total) by mouth daily. 90 tablet 1  . furosemide (LASIX) 20 MG tablet Take 1 tablet (20 mg total) by mouth daily. For LE edema 30 tablet 0  . gabapentin (NEURONTIN) 100 MG capsule Take 1 capsule (100 mg total) by mouth 3 (three) times daily. 60 capsule 5  . Garlic Oil 2831 MG CAPS Take 1,000 mg by mouth 1 day or 1 dose.    .  Lactobacillus (PROBIOTIC ACIDOPHILUS) CAPS Take by mouth once.    . loratadine (CLARITIN) 10 MG tablet Take 1 tablet (10 mg total) by mouth daily. 90 tablet 0  . lovastatin (MEVACOR) 40 MG tablet Take 1 tablet (40 mg total) by mouth at bedtime. 90 tablet 1  . metoprolol succinate (TOPROL-XL) 50 MG 24 hr tablet Take 1 tablet (50 mg total) by mouth 2 (two) times daily. 180 tablet 1  . mometasone (NASONEX) 50 MCG/ACT nasal spray Place 2 sprays into the nose daily.    Marland Kitchen omeprazole (PRILOSEC) 20 MG capsule Take 1 capsule (20 mg total) daily by mouth. 90 capsule 0  . raloxifene (EVISTA) 60 MG  tablet Take 1 tablet (60 mg total) by mouth daily. 90 tablet 1  . SYMBICORT 160-4.5 MCG/ACT inhaler Inhale 2 puffs 2 (two) times daily into the lungs. 1 Inhaler 2  . tiotropium (SPIRIVA) 18 MCG inhalation capsule Place 1 capsule (18 mcg total) into inhaler and inhale daily. 30 capsule 12  . WELCHOL 625 MG tablet Take 1 tablet (625 mg total) by mouth 2 (two) times daily with a meal. 180 tablet 1   No current facility-administered medications on file prior to visit.     BP (!) 144/76 (BP Location: Left Arm, Patient Position: Sitting, Cuff Size: Normal)   Pulse 70   Temp 98.2 F (36.8 C) (Oral)   Resp 18   Ht 5\' 1"  (1.549 m)   Wt 171 lb (77.6 kg)   SpO2 91%   BMI 32.31 kg/m        Objective:   Physical Exam  Constitutional: She is oriented to person, place, and time. She appears well-developed and well-nourished. No distress.  HENT:  Head: Normocephalic and atraumatic.  Neck: Normal range of motion. Neck supple. No tracheal deviation present.  Cardiovascular: Normal rate, regular rhythm, normal heart sounds and intact distal pulses.  Pulmonary/Chest: Effort normal. No respiratory distress. She has wheezes.  Abdominal: Soft. Normal appearance and bowel sounds are normal. There is no tenderness. There is no CVA tenderness.  Musculoskeletal: She exhibits no edema.  Neurological: She is alert and  oriented to person, place, and time. Coordination normal.  Skin: Skin is warm and dry.  Psychiatric: She has a normal mood and affect. Her behavior is normal. Judgment and thought content normal.  Vitals reviewed.     Assessment & Plan:   A total of 40  minutes were spent face-to-face with the patient during this encounter and over half of that time was spent on counseling and coordination of care. The patient was counseled on daily medications including controlled substances and chronic conditions.  RTC in 1 month for anxiety and BP F/U   Dysuria Diagnostic testing ordered: - Urinalysis; Future - Urine Culture; Future Consider referral to urology for recurrent UTI pending UA/Culture results.   PVD (peripheral vascular disease) (HCC) Medications ordered: - furosemide (LASIX) 20 MG tablet; Take 1 tablet (20 mg total) by mouth daily. For LE edema  Dispense: 30 tablet; Refill: 0  Gastroesophageal reflux disease without esophagitis Medications ordered: - omeprazole (PRILOSEC) 20 MG capsule; Take 1 capsule (20 mg total) by mouth daily.  Dispense: 90 capsule; Refill: 0  Post herpetic neuralgia Medications ordered: - gabapentin (NEURONTIN) 100 MG capsule; Take 1 capsule (100 mg total) by mouth 3 (three) times daily.  Dispense: 60 capsule; Refill: 2  Age related osteoporosis, unspecified pathological fracture presence Medications ordered: - raloxifene (EVISTA) 60 MG tablet; Take 1 tablet (60 mg total) by mouth daily.  Dispense: 90 tablet; Refill: 1

## 2017-07-31 LAB — URINE CULTURE
MICRO NUMBER: 90008647
SPECIMEN QUALITY:: ADEQUATE

## 2017-08-02 ENCOUNTER — Encounter: Payer: Self-pay | Admitting: Nurse Practitioner

## 2017-08-02 NOTE — Assessment & Plan Note (Addendum)
Increased sputum production x 1 week, but no fevers, malaise or increased shortness of breath. She will try scheduled nebs for 3 days and follow up for worsening, no improvement, will consider CXR if needed. Return precautions given. Medications ordered: - mometasone (NASONEX) 50 MCG/ACT nasal spray; Place 2 sprays into the nose daily.  Dispense: 17 g; Refill: 1 - SYMBICORT 160-4.5 MCG/ACT inhaler; Inhale 2 puffs into the lungs 2 (two) times daily.  Dispense: 1 Inhaler; Refill: 2 - albuterol (PROAIR HFA) 108 (90 Base) MCG/ACT inhaler; Inhale 1-2 puffs into the lungs every 6 (six) hours as needed for wheezing or shortness of breath.  Dispense: 1 Inhaler; Refill: 5

## 2017-08-02 NOTE — Assessment & Plan Note (Addendum)
Symptoms improved with initiation of celexa. She does have some continued symptoms. Will increase celexa dosage from 10 to 20 daily She will continue xanax at bedtime for now, we discussed weaning this once celexa takes full effect. Medications ordered: - ALPRAZolam (XANAX) 0.25 MG tablet; TAKE 1 TABLET AT BEDTIME AS NEEDED FOR ANXIETY  Dispense: 30 tablet; Refill: 0 - citalopram (CELEXA) 20 MG tablet; Take 1 tablet (20 mg total) by mouth daily.  Dispense: 30 tablet; Refill: 1 RTC in 1 month for follow up of celexa, discuss weaning xanax. Controlled substance registry reviewed with no irregularities.

## 2017-08-02 NOTE — Assessment & Plan Note (Signed)
Cholesterol panel 10/9 stable. Continue lovastatin, fenofibrate, welchol. Medications ordered: - lovastatin (MEVACOR) 40 MG tablet; Take 1 tablet (40 mg total) by mouth at bedtime.  Dispense: 90 tablet; Refill: 1 - colesevelam (WELCHOL) 625 MG tablet; Take 1 tablet (625 mg total) by mouth 2 (two) times daily with a meal.  Dispense: 60 tablet; Refill: 1

## 2017-08-02 NOTE — Assessment & Plan Note (Addendum)
Continue amlodipine, toprol Medications ordered: - amLODipine (NORVASC) 10 MG tablet; Take 1 tablet (10 mg total) by mouth at bedtime.  Dispense: 90 tablet; Refill: 1 - metoprolol succinate (TOPROL-XL) 50 MG 24 hr tablet; Take 1 tablet (50 mg total) by mouth 2 (two) times daily.  Dispense: 180 tablet; Refill: 1 RTC in 1 month for recheck

## 2017-08-25 ENCOUNTER — Other Ambulatory Visit: Payer: Self-pay | Admitting: Nurse Practitioner

## 2017-08-25 DIAGNOSIS — I739 Peripheral vascular disease, unspecified: Secondary | ICD-10-CM

## 2017-08-31 NOTE — Progress Notes (Signed)
Name: Courtney Brown   MRN: [id number]    DOB: 07-20-1941   Date:09/01/2017       Progress Note  Subjective  Chief Complaint  Chief Complaint  Patient presents with  . Follow-up    follow up on celexa increase and needs refill of xanax   HPI   Hypertension -maintained on amlodipine 10, toprol XL 50 BID At her last visit 07/30/17 BP was slightly elevated but she had not take her medications-she was given instructions to monitor BP at home and return in 1 month with log. She reports she had been compliant with medications daily since. She has been checking her blood pressure at home occasionally, shes had readings 130-140/60s. Denies headaches, vision changes, chest pain. She does have chronic LE edema, which she says has actually improved since her last visit  BP Readings from Last 3 Encounters:  09/01/17 138/62  07/30/17 (!) 144/76  05/05/17 140/72   Anxiety- On her last visit on 1/3 her celexa was increased from 10 to 20 daily. She was started celexa in October and felt that the medication was helping her anxiety but continued to have some daily worry and wanted to try increasing the celexa dosage to see if that would improve her anxiety. She has noticed a great improvement on celexa 20 a day, she is able to sleep better and can control her worry. She no longer experiences nausea and feeling sick to her stomach due to her worries. She has continued to take her xanax 0.25 at bedtime for sleep. She feels like the increase celexa has greatly ipmroved her anxiety She does not feel depressed. She denies thoughts of hurting herself or others.  Depression screen [facility] 2/9 09/01/2017 05/05/2017 07/29/2016  Decreased Interest 0 0 0  Down, Depressed, Hopeless 0 0 0  PHQ - 2 Score 0 0 0  Altered sleeping 0 - -  Tired, decreased energy 0 - -  Change in appetite 0 - -  Feeling bad or failure about yourself  0 - -  Trouble concentrating 0 - -  Moving slowly or fidgety/restless 0 - -  Suicidal  thoughts 0 - -  PHQ-9 Score 0 - -   GAD 7 : Generalized Anxiety Score 09/01/2017  Nervous, Anxious, on Edge 0  Control/stop worrying 0  Worry too much - different things 1  Trouble relaxing 0  Restless 0  Easily annoyed or irritable 0  Afraid - awful might happen 0  Total GAD 7 Score 1   Hypomagnesemia- She has been maintained on magnesium in the past for low magnesium levels She was told to d/c her magnesium supplement by a prior provider because her lab work had returned to normal. She would like her magnesium level checked today because she has noticed she feels like her hands are shaking, which is how she felt when she had low magnesium in the past  Lower back pain- This is a chronic problem She has experienced lower back pain for years. She has noticed the pain has increased over the past month and is radiating down her right leg She describes as a tight aching pain. The pain is worse when standing for long periods. She denies any recent injuries or falls. She denies fevers, weakness, numbness, bowel or bladder incontinence.  Rash-  She c/o red raw itchy rash under bilateral breasts that is worse in the summer. She says the rash had resolved during the winter but over the past few warm days she has noticed  some itching and redness returning. She has used an antifungal cream in the past which did help and she is asking if she can have a prescription for this today.  Patient Active Problem List   Diagnosis Date Noted  . Sepsis due to Streptococcus pneumoniae ([place])   . Colon polyps 08/08/2016  . PVD (peripheral vascular disease) ([place]) 08/08/2016  . COPD (chronic obstructive pulmonary disease) ([place]) 07/29/2016  . HTN (hypertension), benign 07/29/2016  . Hyperlipidemia 07/29/2016  . CAD (coronary artery disease) 07/29/2016  . Post herpetic neuralgia 07/29/2016  . Adjustment disorder with mixed anxiety and depressed mood 07/29/2016  . Osteoporosis 07/29/2016  .  Gastroesophageal reflux disease without esophagitis 07/29/2016  . Hearing loss 07/29/2016    Past Surgical History:  Procedure Laterality Date  . ABDOMINAL HYSTERECTOMY  1998  . 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 HERNIA REPAIR     umbilical, ventral  . INSERTION OF ILIAC STENT  2008    Family History  Problem Relation Age of Onset  . Hearing loss Father   . Hearing loss Maternal Aunt   . Hearing loss Maternal Grandmother   . Cancer Maternal Grandmother        colon  . Arthritis Mother   . Cancer Paternal Grandmother        colon    Social History   Socioeconomic History  . Marital status: Widowed    Spouse name: Not on file  . Number of children: Not on file  . Years of education: Not on file  . Highest education level: Not on file  Social Needs  . Financial resource strain: Not on file  . Food insecurity - worry: Not on file  . Food insecurity - inability: Not on file  . Transportation needs - medical: Not on file  . Transportation needs - non-medical: Not on file  Occupational History  . Not on file  Tobacco Use  . Smoking status: Former Smoker    Packs/day: 1.50    Years: 15.00    Pack years: 22.50  . Smokeless tobacco: Never Used  . Tobacco comment: stop 04/05/2006  Substance and Sexual Activity  . Alcohol use: Yes    Alcohol/week: 1.2 oz    Types: 2 Shots of liquor per week    Comment: social, weekly  . Drug use: No  . Sexual activity: Not Currently  Other Topics Concern  . Not on file  Social History Narrative  . Not on file     Current Outpatient Medications:  .  acetaminophen (TYLENOL) 500 MG tablet, Take 500 mg by mouth every 6 (six) hours as needed., Disp: , Rfl:  .  albuterol (PROAIR HFA) 108 (90 Base) MCG/ACT inhaler, Inhale 1-2 puffs into the lungs every 6 (six) hours as needed for wheezing or shortness of breath., Disp: 1 Inhaler, Rfl: 5 .  ALPRAZolam (XANAX) 0.25 MG tablet, TAKE 1 TABLET AT BEDTIME AS NEEDED FOR ANXIETY,  Disp: 30 tablet, Rfl: 0 .  amLODipine (NORVASC) 10 MG tablet, Take 1 tablet (10 mg total) by mouth at bedtime., Disp: 90 tablet, Rfl: 1 .  aspirin 81 MG tablet, Take 81 mg by mouth daily., Disp: , Rfl:  .  citalopram (CELEXA) 20 MG tablet, Take 1 tablet (20 mg total) by mouth daily., Disp: 30 tablet, Rfl: 1 .  colesevelam (WELCHOL) 625 MG tablet, Take 1 tablet (625 mg total) by mouth 2 (two) times daily with a meal., Disp: 60 tablet, Rfl: 1 .  fenofibrate (TRICOR) 145 MG tablet, Take 1 tablet (145 mg total) by mouth daily., Disp: 90 tablet, Rfl: 1 .  furosemide (LASIX) 20 MG tablet, TAKE 1 TABLET (20 MG TOTAL) BY MOUTH DAILY. FOR LE EDEMA, Disp: 30 tablet, Rfl: 0 .  gabapentin (NEURONTIN) 100 MG capsule, Take 1 capsule (100 mg total) by mouth 3 (three) times daily., Disp: 60 capsule, Rfl: 2 .  Garlic Oil 0962 MG CAPS, Take 1,000 mg by mouth 1 day or 1 dose., Disp: , Rfl:  .  Lactobacillus (PROBIOTIC ACIDOPHILUS) CAPS, Take by mouth once., Disp: , Rfl:  .  loratadine (CLARITIN) 10 MG tablet, Take 1 tablet (10 mg total) by mouth daily., Disp: 90 tablet, Rfl: 0 .  lovastatin (MEVACOR) 40 MG tablet, Take 1 tablet (40 mg total) by mouth at bedtime., Disp: 90 tablet, Rfl: 1 .  metoprolol succinate (TOPROL-XL) 50 MG 24 hr tablet, Take 1 tablet (50 mg total) by mouth 2 (two) times daily., Disp: 180 tablet, Rfl: 1 .  mometasone (NASONEX) 50 MCG/ACT nasal spray, Place 2 sprays into the nose daily., Disp: 17 g, Rfl: 1 .  omeprazole (PRILOSEC) 20 MG capsule, Take 1 capsule (20 mg total) by mouth daily., Disp: 90 capsule, Rfl: 0 .  raloxifene (EVISTA) 60 MG tablet, Take 1 tablet (60 mg total) by mouth daily., Disp: 90 tablet, Rfl: 1 .  SYMBICORT 160-4.5 MCG/ACT inhaler, Inhale 2 puffs into the lungs 2 (two) times daily., Disp: 1 Inhaler, Rfl: 2 .  tiotropium (SPIRIVA) 18 MCG inhalation capsule, Place 1 capsule (18 mcg total) into inhaler and inhale daily., Disp: 30 capsule, Rfl: 12  Allergies  Allergen  Reactions  . Prednisone Other (See Comments)    Leg pain,light headed    ROS See HPI  Objective  Vitals:   09/01/17 1204  BP: 138/62  Pulse: 60  Resp: 16  Temp: (!) 97.5 F (36.4 C)  TempSrc: Oral  SpO2: 93%  Weight: 172 lb (78 kg)  Height: 5\' 1"  (1.549 m)    Body mass index is 32.5 kg/m.  Physical Exam Vital signs reviewed Constitutional: She is oriented to person, place, and time. She appears well-developed and well-nourished. No distress.  HENT:  Head: Normocephalic and atraumatic.  Neck: Normal range of motion. Neck supple. No tracheal deviation present.  Cardiovascular: Normal rate, regular rhythm, normal heart sounds and intact distal pulses. She exhibits no edema. Pulmonary/Chest: Effort normal, lungs are clear. No respiratory distress.  Musculoskeletal: Normal range of motion, no joint effusions. No gross deformities. Lower back is tender; no swelling or deformity. Neurological: She is alert and oriented to person, place, and time. Coordination, balance, strength, speech and gait are normal.  Skin: Skin is warm and dry.  Psychiatric: She has a normal mood and affect. Her behavior is normal. Judgment and thought content normal.  Vitals reviewed.  Assessment & Plan RTC in about 3 months, for follow up of chronic conditions  -Reviewed Health Maintenance: up to date  Hypomagnesemia - Basic metabolic panel; Future - Magnesium; Future   Rash ?intertrigo- we discussed home care measures and return precautions- See AVS for education provided to patient - nystatin ointment (MYCOSTATIN); Apply 1 application topically 2 (two) times daily.  Dispense: 30 g; Refill: 0 - triamcinolone cream (KENALOG) 0.1 %; Apply 1 application topically 2 (two) times daily.  Dispense: 30 g; Refill: 0

## 2017-09-01 ENCOUNTER — Ambulatory Visit (INDEPENDENT_AMBULATORY_CARE_PROVIDER_SITE_OTHER)
Admission: RE | Admit: 2017-09-01 | Discharge: 2017-09-01 | Disposition: A | Discharge status: Home / Self Care | Payer: Medicare Other | Source: Ambulatory Visit | Attending: Nurse Practitioner | Admitting: Nurse Practitioner

## 2017-09-01 ENCOUNTER — Encounter: Payer: Self-pay | Admitting: Nurse Practitioner

## 2017-09-01 ENCOUNTER — Ambulatory Visit (INDEPENDENT_AMBULATORY_CARE_PROVIDER_SITE_OTHER): Payer: Medicare Other | Admitting: Nurse Practitioner

## 2017-09-01 ENCOUNTER — Other Ambulatory Visit (INDEPENDENT_AMBULATORY_CARE_PROVIDER_SITE_OTHER): Payer: Medicare Other

## 2017-09-01 VITALS — BP 138/62 | HR 60 | Temp 97.5°F | Resp 16 | Ht 61.0 in | Wt 172.0 lb

## 2017-09-01 DIAGNOSIS — G8929 Other chronic pain: Secondary | ICD-10-CM

## 2017-09-01 DIAGNOSIS — F4323 Adjustment disorder with mixed anxiety and depressed mood: Secondary | ICD-10-CM | POA: Diagnosis not present

## 2017-09-01 DIAGNOSIS — M545 Low back pain: Secondary | ICD-10-CM | POA: Diagnosis not present

## 2017-09-01 DIAGNOSIS — R21 Rash and other nonspecific skin eruption: Secondary | ICD-10-CM | POA: Diagnosis not present

## 2017-09-01 DIAGNOSIS — M5441 Lumbago with sciatica, right side: Secondary | ICD-10-CM | POA: Diagnosis not present

## 2017-09-01 DIAGNOSIS — I10 Essential (primary) hypertension: Secondary | ICD-10-CM

## 2017-09-01 LAB — BASIC METABOLIC PANEL
BUN: 28 mg/dL — ABNORMAL HIGH (ref 6–23)
CO2: 31 meq/L (ref 19–32)
Calcium: 8.7 mg/dL (ref 8.4–10.5)
Chloride: 102 mEq/L (ref 96–112)
Creatinine, Ser: 0.99 mg/dL (ref 0.40–1.20)
GFR: 57.95 mL/min — AB (ref >60.00)
GLUCOSE: 107 mg/dL — AB (ref 70–99)
POTASSIUM: 3.4 meq/L — AB (ref 3.5–5.1)
SODIUM: 141 meq/L (ref 135–145)

## 2017-09-01 LAB — MAGNESIUM: Magnesium: 1.3 mg/dL — ABNORMAL LOW (ref 1.5–2.5)

## 2017-09-01 MED ORDER — NYSTATIN 100000 UNIT/GM EX OINT: 1.0000 "application " | TOPICAL_OINTMENT | Freq: Two times a day (BID) | CUTANEOUS | 0 refills | Status: DC

## 2017-09-01 MED ORDER — TRIAMCINOLONE ACETONIDE 0.1 % EX CREA: 1.0000 "application " | TOPICAL_CREAM | Freq: Two times a day (BID) | CUTANEOUS | 0 refills | Status: DC

## 2017-09-01 MED ORDER — ALPRAZOLAM 0.25 MG PO TABS: ORAL_TABLET | ORAL | 0 refills | Status: DC

## 2017-09-01 NOTE — Assessment & Plan Note (Signed)
Stable, continue current medications - Basic metabolic panel; Future

## 2017-09-01 NOTE — Assessment & Plan Note (Signed)
We discussed options for back pain today including physical therapy, evaluation by sports medicine. She is requesting to have an x-ray done today and will consider further recommendations pending x-ray results. - DG Lumbar Spine Complete; Future

## 2017-09-01 NOTE — Assessment & Plan Note (Signed)
Stable, we will continue celexa 20 daily and xanax 0.25 at bedtime - ALPRAZolam (XANAX) 0.25 MG tablet; TAKE 1 TABLET AT BEDTIME AS NEEDED FOR ANXIETY  Dispense: 30 tablet; Refill: 0

## 2017-09-01 NOTE — Patient Instructions (Addendum)
Please head downstairs for lab work/xrays.  Id like to see you back in about 3 months, for routine follow up of chronic conditions.  I have sent 2 creams that you can apply together for your rash, a steroid and an antifungal. If you find the name of the one you were taking before then I can try to sent it instead.  It was nice to see you. Thanks for letting me take care of you today :)  Intertrigo Intertrigo is skin irritation (inflammation) that happens in warm, moist areas of the body. The irritation can cause a rash and make skin raw and itchy. The rash is usually pink or red. It happens mostly between folds of skin or where skin rubs together, such as:  Toes.  Armpits.  Groin.  Belly.  Breasts.  Buttocks.  This condition is not passed from person to person (is not contagious). Follow these instructions at home:  Keep the affected area clean and dry.  Do not scratch your skin.  Stay cool as much as possible. Use an air conditioner or fan, if you can.  Apply over-the-counter and prescription medicines only as told by your doctor.  If you were prescribed an antibiotic medicine, use it as told by your doctor. Do not stop using the antibiotic even if your condition starts to get better.  Keep all follow-up visits as told by your doctor. This is important. How is this prevented?  Stay at a healthy weight.  Keep your feet dry. This is very important if you have diabetes. Wear cotton or wool socks.  Take care of and protect the skin in your groin and butt area as told by your doctor.  Do not wear tight clothes. Wear clothes that: ? Are loose. ? Take away moisture from your body. ? Are made of cotton.  Wear a bra that gives good support, if needed.  Shower and dry yourself fully after being active.  Keep your blood sugar under control if you have diabetes. Contact a doctor if:  Your symptoms do not get better with treatment  Your symptoms get worse or they  spread.  You notice more redness and warmth.  You have a fever. This information is not intended to replace advice given to you by your health care provider. Make sure you discuss any questions you have with your health care provider. Document Released: 08/16/2010 Document Revised: 12/20/2015 Document Reviewed: 01/15/2015 Elsevier Interactive Patient Education  [employer].

## 2017-09-07 ENCOUNTER — Other Ambulatory Visit: Payer: Self-pay | Admitting: Nurse Practitioner

## 2017-09-07 MED ORDER — MAGNESIUM OXIDE 400 MG PO CAPS: 1.0000 | ORAL_CAPSULE | Freq: Every day | ORAL | 2 refills | Status: DC

## 2017-09-23 ENCOUNTER — Other Ambulatory Visit: Payer: Self-pay | Admitting: Nurse Practitioner

## 2017-09-23 DIAGNOSIS — F4323 Adjustment disorder with mixed anxiety and depressed mood: Secondary | ICD-10-CM

## 2017-09-23 DIAGNOSIS — I739 Peripheral vascular disease, unspecified: Secondary | ICD-10-CM

## 2017-09-25 ENCOUNTER — Other Ambulatory Visit (INDEPENDENT_AMBULATORY_CARE_PROVIDER_SITE_OTHER): Payer: Medicare Other

## 2017-09-25 LAB — BASIC METABOLIC PANEL
BUN: 17 mg/dL (ref 6–23)
CHLORIDE: 100 meq/L (ref 96–112)
CO2: 35 mEq/L — ABNORMAL HIGH (ref 19–32)
Calcium: 9.2 mg/dL (ref 8.4–10.5)
Creatinine, Ser: 1.05 mg/dL (ref 0.40–1.20)
GFR: 54.14 mL/min — AB (ref >60.00)
Glucose, Bld: 95 mg/dL (ref 70–99)
POTASSIUM: 4.1 meq/L (ref 3.5–5.1)
Sodium: 140 mEq/L (ref 135–145)

## 2017-09-25 LAB — MAGNESIUM: Magnesium: 1.3 mg/dL — ABNORMAL LOW (ref 1.5–2.5)

## 2017-09-27 ENCOUNTER — Other Ambulatory Visit: Payer: Self-pay | Admitting: Nurse Practitioner

## 2017-09-27 DIAGNOSIS — F4323 Adjustment disorder with mixed anxiety and depressed mood: Secondary | ICD-10-CM

## 2017-09-28 ENCOUNTER — Telehealth: Payer: Self-pay | Admitting: Nurse Practitioner

## 2017-09-28 NOTE — Telephone Encounter (Signed)
Copied from [place] [id number]. Topic: Quick Communication - Rx Refill/Question >> Sep 28, 2017  3:07 PM Lolita Rieger, [place] wrote: Medication: xanax 0.25 mg   Has the patient contacted their pharmacy? yes   (Agent: If no, request that the patient contact the pharmacy for the refill.)   Preferred Pharm[facility] phone [facility] [address]   Agent: Please be advised that RX refills may take up to 3 business days. We ask that you follow-up with your pharmacy.

## 2017-09-28 NOTE — Telephone Encounter (Signed)
Last refill was 09/01/17 per database.

## 2017-10-05 ENCOUNTER — Telehealth: Payer: Self-pay | Admitting: Nurse Practitioner

## 2017-10-05 IMAGING — DX DG CHEST 1V PORT
1 series · 1 of 1 positions shown · non-contrast
Comparison: August 08, 2016

CLINICAL DATA: Cough with shortness of breath

EXAM:
PORTABLE CHEST 1 VIEW

[chest ap]
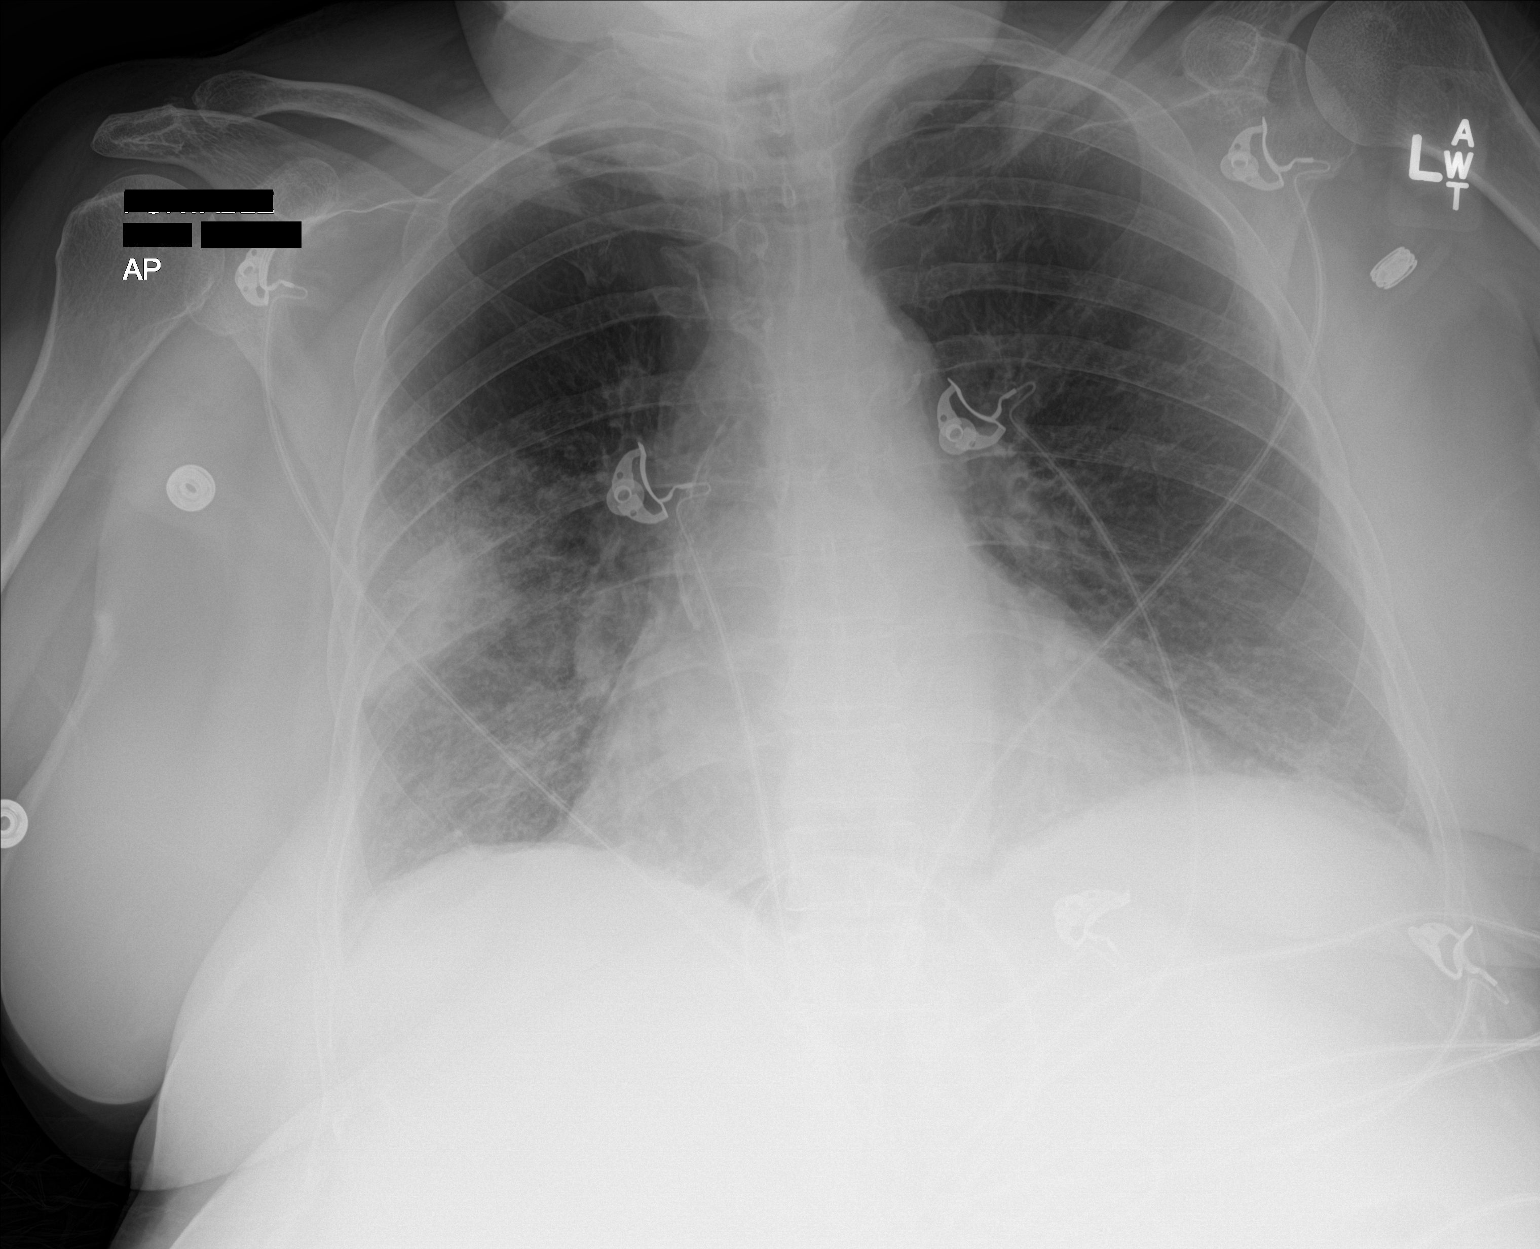

[1 of 1 positions shown; findings below may reference images not displayed]

FINDINGS: There is focal airspace consolidation in the periphery of the right
mid lung. There is also subtle infiltrate in the left base. Lungs
elsewhere clear. Heart is mildly enlarged with pulmonary vascularity
within normal limits. No adenopathy. There is aortic
atherosclerosis. No bone lesions.
IMPRESSION: Airspace consolidation in the right mid lung and to a lesser extent
in the left base consistent with pneumonia. Lungs elsewhere clear.
Stable cardiac prominence. There is aortic atherosclerosis.

Followup PA and lateral chest radiographs recommended in 3-4 weeks
following trial of antibiotic therapy to ensure resolution and
exclude underlying malignancy.

## 2017-10-05 NOTE — Telephone Encounter (Signed)
Left VM for pt to call back for results

## 2017-10-05 NOTE — Telephone Encounter (Signed)
Tried reaching out to patient again and no answer. LVM for her to call back about labs.

## 2017-10-05 NOTE — Telephone Encounter (Signed)
Copied from [place]. Topic: Quick Communication - See Telephone Encounter >> Oct 05, 2017 12:07 PM Bea Graff, NT wrote: CRM for notification. See Telephone encounter for: Pt calling to get lab results.   10/05/17.

## 2017-10-05 NOTE — Telephone Encounter (Signed)
Patient calling back to get lab results. 

## 2017-10-22 ENCOUNTER — Other Ambulatory Visit: Payer: Self-pay | Admitting: Nurse Practitioner

## 2017-10-22 DIAGNOSIS — E782 Mixed hyperlipidemia: Secondary | ICD-10-CM

## 2017-10-22 DIAGNOSIS — I739 Peripheral vascular disease, unspecified: Secondary | ICD-10-CM

## 2017-10-24 ENCOUNTER — Other Ambulatory Visit: Payer: Self-pay | Admitting: Nurse Practitioner

## 2017-10-24 DIAGNOSIS — J449 Chronic obstructive pulmonary disease, unspecified: Secondary | ICD-10-CM

## 2017-10-29 ENCOUNTER — Other Ambulatory Visit: Payer: Self-pay | Admitting: Nurse Practitioner

## 2017-10-29 DIAGNOSIS — F4323 Adjustment disorder with mixed anxiety and depressed mood: Secondary | ICD-10-CM

## 2017-10-29 MED ORDER — ALPRAZOLAM 0.25 MG PO TABS: ORAL_TABLET | ORAL | 0 refills | Status: DC

## 2017-10-29 NOTE — Telephone Encounter (Signed)
Check [place] registry last filled 09/29/17.Marland Kitchen/LMB

## 2017-10-29 NOTE — Telephone Encounter (Signed)
Copied from [place] [phone]. Topic: General - Other >> Oct 29, 2017  2:36 PM Darl Householder, RMA wrote: Reason for CRM: Medication refill request for ALPRAZolam (XANAX) 0.25 MG tablet to be sent to [place]

## 2017-10-29 NOTE — Telephone Encounter (Signed)
Refill of Xanax  LOV 09/01/17  [id number] [place], [place] [address] [place] [address].

## 2017-11-17 ENCOUNTER — Other Ambulatory Visit: Payer: Self-pay | Admitting: Nurse Practitioner

## 2017-11-17 DIAGNOSIS — F4323 Adjustment disorder with mixed anxiety and depressed mood: Secondary | ICD-10-CM

## 2017-11-21 ENCOUNTER — Other Ambulatory Visit: Payer: Self-pay | Admitting: Nurse Practitioner

## 2017-11-21 DIAGNOSIS — I739 Peripheral vascular disease, unspecified: Secondary | ICD-10-CM

## 2017-11-28 ENCOUNTER — Other Ambulatory Visit: Payer: Self-pay | Admitting: Nurse Practitioner

## 2017-11-28 DIAGNOSIS — I739 Peripheral vascular disease, unspecified: Secondary | ICD-10-CM

## 2017-11-30 ENCOUNTER — Other Ambulatory Visit: Payer: Self-pay | Admitting: Nurse Practitioner

## 2017-12-01 ENCOUNTER — Ambulatory Visit (INDEPENDENT_AMBULATORY_CARE_PROVIDER_SITE_OTHER): Payer: Medicare Other | Admitting: Nurse Practitioner

## 2017-12-01 ENCOUNTER — Other Ambulatory Visit (INDEPENDENT_AMBULATORY_CARE_PROVIDER_SITE_OTHER): Payer: Medicare Other

## 2017-12-01 ENCOUNTER — Encounter: Payer: Self-pay | Admitting: Nurse Practitioner

## 2017-12-01 VITALS — BP 118/60 | HR 62 | Temp 98.2°F | Resp 18 | Ht 61.0 in | Wt 164.0 lb

## 2017-12-01 DIAGNOSIS — Z1231 Encounter for screening mammogram for malignant neoplasm of breast: Secondary | ICD-10-CM

## 2017-12-01 DIAGNOSIS — I10 Essential (primary) hypertension: Secondary | ICD-10-CM

## 2017-12-01 DIAGNOSIS — J449 Chronic obstructive pulmonary disease, unspecified: Secondary | ICD-10-CM | POA: Diagnosis not present

## 2017-12-01 DIAGNOSIS — I739 Peripheral vascular disease, unspecified: Secondary | ICD-10-CM | POA: Diagnosis not present

## 2017-12-01 DIAGNOSIS — B0229 Other postherpetic nervous system involvement: Secondary | ICD-10-CM | POA: Diagnosis not present

## 2017-12-01 DIAGNOSIS — F4323 Adjustment disorder with mixed anxiety and depressed mood: Secondary | ICD-10-CM | POA: Diagnosis not present

## 2017-12-01 DIAGNOSIS — Z1239 Encounter for other screening for malignant neoplasm of breast: Secondary | ICD-10-CM

## 2017-12-01 DIAGNOSIS — E782 Mixed hyperlipidemia: Secondary | ICD-10-CM

## 2017-12-01 LAB — BASIC METABOLIC PANEL
BUN: 27 mg/dL — AB (ref 6–23)
CO2: 34 meq/L — AB (ref 19–32)
Calcium: 9.1 mg/dL (ref 8.4–10.5)
Chloride: 101 mEq/L (ref 96–112)
Creatinine, Ser: 0.98 mg/dL (ref 0.40–1.20)
GFR: 58.6 mL/min — AB (ref >60.00)
Glucose, Bld: 90 mg/dL (ref 70–99)
Potassium: 3.9 mEq/L (ref 3.5–5.1)
Sodium: 141 mEq/L (ref 135–145)

## 2017-12-01 LAB — MAGNESIUM: MAGNESIUM: 1.2 mg/dL — AB (ref 1.5–2.5)

## 2017-12-01 MED ORDER — GABAPENTIN 100 MG PO CAPS: 100.0000 mg | ORAL_CAPSULE | Freq: Three times a day (TID) | ORAL | 2 refills | Status: DC

## 2017-12-01 MED ORDER — FENOFIBRATE 145 MG PO TABS: 145.0000 mg | ORAL_TABLET | Freq: Every day | ORAL | 1 refills | Status: DC

## 2017-12-01 MED ORDER — SYMBICORT 160-4.5 MCG/ACT IN AERO: 2.0000 | INHALATION_SPRAY | Freq: Two times a day (BID) | RESPIRATORY_TRACT | 2 refills | Status: DC

## 2017-12-01 MED ORDER — FUROSEMIDE 20 MG PO TABS: ORAL_TABLET | ORAL | 0 refills | Status: DC

## 2017-12-01 MED ORDER — CITALOPRAM HYDROBROMIDE 20 MG PO TABS: 20.0000 mg | ORAL_TABLET | Freq: Every day | ORAL | 1 refills | Status: DC

## 2017-12-01 MED ORDER — ALPRAZOLAM 0.25 MG PO TABS: ORAL_TABLET | ORAL | 0 refills | Status: DC

## 2017-12-01 MED ORDER — COLESEVELAM HCL 625 MG PO TABS: 625.0000 mg | ORAL_TABLET | Freq: Two times a day (BID) | ORAL | 1 refills | Status: DC

## 2017-12-01 MED ORDER — METOPROLOL SUCCINATE ER 50 MG PO TB24: 50.0000 mg | ORAL_TABLET | Freq: Two times a day (BID) | ORAL | 1 refills | Status: DC

## 2017-12-01 NOTE — Patient Instructions (Signed)
I have placed a referral to pulmonology. Our office will call you to schedule this appointment. You should hear from our office in 7-10 days.  Please head downstairs for lab work/x-rays. If any of your test results are critically abnormal, you will be contacted right away. Your results may be released to your MyChart for viewing before I am able to provide you with my response. I will contact you within a week about your test results and any recommendations for abnormalities.  Please return to see me in about 6 months, we can do your annual physical if you are feeling well.  Keep up the great work on healthy diet!!!

## 2017-12-01 NOTE — Progress Notes (Signed)
Name: Courtney Brown   MRN: [id number]    DOB: [birth year]   Date:12/20/2017       Progress Note  Subjective  Chief Complaint  Chief Complaint  Patient presents with  . Medication Refill  . Cough    HPI [name] is here today requesting repeat evaluation of cough and requesting multiple medication refills. We will also update her magnesium level for recent hypomagnesemia and screening mammogram related to evista therapy.  Cough-  This began 1 week ago on Saturday She reports increased cough with thick white sputum  She reports allergy symptoms and wonders if her cough may be related to increased pollen recently  Denies fevers, malaise, fatigue, shortness of breath, chest pain, abdominal pain She overall feels well Taking mucinex DM, nasonex, claritin daily. She is also maintained on symbicort daily for COPD with reported compliance  Osteoporosis- maintained on evista 60mg  daily  Reports daily medication without noted adverse medication effects  Patient Active Problem List   Diagnosis Date Noted  . Chronic bilateral low back pain with right-sided sciatica 09/01/2017  . Colon polyps 08/08/2016  . PVD (peripheral vascular disease) ([place]) 08/08/2016  . COPD (chronic obstructive pulmonary disease) ([place]) 07/29/2016  . HTN (hypertension), benign 07/29/2016  . Hyperlipidemia 07/29/2016  . CAD (coronary artery disease) 07/29/2016  . Post herpetic neuralgia 07/29/2016  . Adjustment disorder with mixed anxiety and depressed mood 07/29/2016  . Osteoporosis 07/29/2016  . Gastroesophageal reflux disease without esophagitis 07/29/2016  . Hearing loss 07/29/2016    Past Surgical History:  Procedure Laterality Date  . ABDOMINAL HYSTERECTOMY  1998  . 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 HERNIA REPAIR     umbilical, ventral  . INSERTION OF ILIAC STENT  2008    Family History  Problem Relation Age of Onset  . Hearing loss Father   . Hearing loss Maternal Aunt   .  Hearing loss Maternal Grandmother   . Cancer Maternal Grandmother        colon  . Arthritis Mother   . Cancer Paternal Grandmother        colon    Social History   Socioeconomic History  . Marital status: Widowed    Spouse name: Not on file  . Number of children: Not on file  . Years of education: Not on file  . Highest education level: Not on file  Occupational History  . Not on file  Social Needs  . Financial resource strain: Not on file  . Food insecurity:    Worry: Not on file    Inability: Not on file  . Transportation needs:    Medical: Not on file    Non-medical: Not on file  Tobacco Use  . Smoking status: Former Smoker    Packs/day: 1.50    Years: 15.00    Pack years: 22.50  . Smokeless tobacco: Never Used  . Tobacco comment: stop 04/05/2006  Substance and Sexual Activity  . Alcohol use: Yes    Alcohol/week: 1.2 oz    Types: 2 Shots of liquor per week    Comment: social, weekly  . Drug use: No  . Sexual activity: Not Currently  Lifestyle  . Physical activity:    Days per week: Not on file    Minutes per session: Not on file  . Stress: Not on file  Relationships  . Social connections:    Talks on phone: Not on file    Gets together: Not on file  Attends religious service: Not on file    Active member of club or organization: Not on file    Attends meetings of clubs or organizations: Not on file    Relationship status: Not on file  . Intimate partner violence:    Fear of current or ex partner: Not on file    Emotionally abused: Not on file    Physically abused: Not on file    Forced sexual activity: Not on file  Other Topics Concern  . Not on file  Social History Narrative  . Not on file     Current Outpatient Medications:  .  acetaminophen (TYLENOL) 500 MG tablet, Take 500 mg by mouth every 6 (six) hours as needed., Disp: , Rfl:  .  albuterol (PROAIR HFA) 108 (90 Base) MCG/ACT inhaler, Inhale 1-2 puffs into the lungs every 6 (six) hours as  needed for wheezing or shortness of breath., Disp: 1 Inhaler, Rfl: 5 .  ALPRAZolam (XANAX) 0.25 MG tablet, TAKE 1 TABLET BY MOUTH AT BEDTIME AS NEEDED FOR ANXIETY, Disp: 30 tablet, Rfl: 0 .  amLODipine (NORVASC) 10 MG tablet, Take 1 tablet (10 mg total) by mouth at bedtime., Disp: 90 tablet, Rfl: 1 .  aspirin 81 MG tablet, Take 81 mg by mouth daily., Disp: , Rfl:  .  citalopram (CELEXA) 20 MG tablet, Take 1 tablet (20 mg total) by mouth daily., Disp: 30 tablet, Rfl: 1 .  colesevelam (WELCHOL) 625 MG tablet, Take 1 tablet (625 mg total) by mouth 2 (two) times daily with a meal., Disp: 60 tablet, Rfl: 1 .  fenofibrate (TRICOR) 145 MG tablet, Take 1 tablet (145 mg total) by mouth daily., Disp: 90 tablet, Rfl: 1 .  furosemide (LASIX) 20 MG tablet, TAKE 1 TABLET BY MOUTH DAILY. FOR LEG EDEMA, Disp: 90 tablet, Rfl: 0 .  gabapentin (NEURONTIN) 100 MG capsule, Take 1 capsule (100 mg total) by mouth 3 (three) times daily., Disp: 60 capsule, Rfl: 2 .  Garlic Oil 2694 MG CAPS, Take 1,000 mg by mouth 1 day or 1 dose., Disp: , Rfl:  .  Lactobacillus (PROBIOTIC ACIDOPHILUS) CAPS, Take by mouth once., Disp: , Rfl:  .  loratadine (CLARITIN) 10 MG tablet, Take 1 tablet (10 mg total) by mouth daily., Disp: 90 tablet, Rfl: 0 .  lovastatin (MEVACOR) 40 MG tablet, Take 1 tablet (40 mg total) by mouth at bedtime., Disp: 90 tablet, Rfl: 1 .  metoprolol succinate (TOPROL-XL) 50 MG 24 hr tablet, Take 1 tablet (50 mg total) by mouth 2 (two) times daily., Disp: 180 tablet, Rfl: 1 .  mometasone (NASONEX) 50 MCG/ACT nasal spray, PLACE 2 SPRAYS INTO THE NOSE DAILY., Disp: 17 g, Rfl: 1 .  nystatin ointment (MYCOSTATIN), Apply 1 application topically 2 (two) times daily., Disp: 30 g, Rfl: 0 .  raloxifene (EVISTA) 60 MG tablet, Take 1 tablet (60 mg total) by mouth daily., Disp: 90 tablet, Rfl: 1 .  SYMBICORT 160-4.5 MCG/ACT inhaler, Inhale 2 puffs into the lungs 2 (two) times daily., Disp: 1 Inhaler, Rfl: 2 .  tiotropium (SPIRIVA)  18 MCG inhalation capsule, Place 1 capsule (18 mcg total) into inhaler and inhale daily., Disp: 30 capsule, Rfl: 12 .  triamcinolone cream (KENALOG) 0.1 %, Apply 1 application topically 2 (two) times daily., Disp: 30 g, Rfl: 0 .  magnesium oxide (MAG-OX) 400 (241.3 Mg) MG tablet, TAKE 1 CAPSULE BY MOUTH TWICE DAILY., Disp: 60 tablet, Rfl: 0 .  omeprazole (PRILOSEC) 20 MG capsule, TAKE 1 CAPSULE BY  MOUTH EVERY DAY, Disp: 90 capsule, Rfl: 1  Allergies  Allergen Reactions  . Prednisone Other (See Comments)    Leg pain,light headed     ROS See HPI  Objective  Vitals:   12/01/17 1125  BP: 118/60  Pulse: 62  Resp: 18  Temp: 98.2 F (36.8 C)  TempSrc: Oral  SpO2: 95%  Weight: 164 lb (74.4 kg)  Height: 5\' 1"  (1.549 m)   Body mass index is 30.99 kg/m.  Physical Exam Vital signs reviewed. Constitutional: She isoriented to person, place, and time. She appearswell-developedand well-nourished.No distress.  HENT:  Head:Normocephalicand atraumatic.  Neck:Normal range of motion.Neck supple.No tracheal deviationpresent.  Cardiovascular:Normal rate,regular rhythm,normal heart soundsand intact distal pulses. She exhibits no edema. Pulmonary/Chest:Effort normal, lungs are clear. Norespiratory distress.  Musculoskeletal: Normal range of motion. No gross deformities.  Neurological: She is alert and oriented to person, place, and time. Coordination, balance, strength, speech and gait are normal.  Skin: Skin is warm and dry.  Psychiatric: She has anormal mood and affect. Herbehavior is normal.Judgmentand thought contentnormal.    Fall Risk: Fall Risk  05/05/2017 07/29/2016  Falls in the past year? No No   Assessment & Plan RTC in 6 months for CPE  -Reviewed Health Maintenance: up to date  Hypomagnesemia Continue magesium supplement Update labs F/U with further recommendations pending lab results - Magnesium; Future  Screening for breast cancer Continue  evista Update screening mammogram - MM DIGITAL SCREENING BILATERAL; Future

## 2017-12-02 ENCOUNTER — Other Ambulatory Visit: Payer: Self-pay | Admitting: Nurse Practitioner

## 2017-12-02 DIAGNOSIS — K219 Gastro-esophageal reflux disease without esophagitis: Secondary | ICD-10-CM

## 2017-12-08 ENCOUNTER — Other Ambulatory Visit: Payer: Self-pay | Admitting: Family

## 2017-12-08 MED ORDER — MAGNESIUM OXIDE 400 (241.3 MG) MG PO TABS: ORAL_TABLET | ORAL | 0 refills | Status: DC

## 2017-12-20 NOTE — Assessment & Plan Note (Signed)
Stable, continue current meds - metoprolol succinate (TOPROL-XL) 50 MG 24 hr tablet; Take 1 tablet (50 mg total) by mouth 2 (two) times daily.  Dispense: 180 tablet; Refill: 1 - Basic metabolic panel; Future

## 2017-12-20 NOTE — Assessment & Plan Note (Signed)
Stable, continue current medications - gabapentin (NEURONTIN) 100 MG capsule; Take 1 capsule (100 mg total) by mouth 3 (three) times daily.  Dispense: 60 capsule; Refill: 2

## 2017-12-20 NOTE — Assessment & Plan Note (Signed)
Overall feels well with no fevers, SOB and normal PE today Recurrent exacerbations of cough and increased sputum production this year, likely related to her dx of COPD Discssed referral to pulmonology for further evaluation and management of COPD due to her frequent exacerbations and she is agreeable - SYMBICORT 160-4.5 MCG/ACT inhaler; Inhale 2 puffs into the lungs 2 (two) times daily.  Dispense: 1 Inhaler; Refill: 2 - Ambulatory referral to Pulmonology

## 2017-12-20 NOTE — Assessment & Plan Note (Signed)
Stable, continue current medications F/U for new, worsening symptoms - furosemide (LASIX) 20 MG tablet; TAKE 1 TABLET BY MOUTH DAILY. FOR LEG EDEMA  Dispense: 90 tablet; Refill: 0

## 2017-12-20 NOTE — Assessment & Plan Note (Signed)
Stable, continue current meds Lipid panel UTD - colesevelam (WELCHOL) 625 MG tablet; Take 1 tablet (625 mg total) by mouth 2 (two) times daily with a meal.  Dispense: 60 tablet; Refill: 1 - fenofibrate (TRICOR) 145 MG tablet; Take 1 tablet (145 mg total) by mouth daily.  Dispense: 90 tablet; Refill: 1

## 2017-12-20 NOTE — Assessment & Plan Note (Signed)
Stable, continue current medications  F/u for new, worsening condition - citalopram (CELEXA) 20 MG tablet; Take 1 tablet (20 mg total) by mouth daily.  Dispense: 30 tablet; Refill: 1 - ALPRAZolam (XANAX) 0.25 MG tablet; TAKE 1 TABLET BY MOUTH AT BEDTIME AS NEEDED FOR ANXIETY  Dispense: 30 tablet; Refill: 0

## 2017-12-31 ENCOUNTER — Other Ambulatory Visit: Payer: Self-pay | Admitting: Nurse Practitioner

## 2017-12-31 DIAGNOSIS — F4323 Adjustment disorder with mixed anxiety and depressed mood: Secondary | ICD-10-CM

## 2017-12-31 NOTE — Telephone Encounter (Signed)
LOV  12/01/17 Courtney Brown Last refill 12/01/17  # 30  0 refill

## 2017-12-31 NOTE — Telephone Encounter (Signed)
Copied from [place]. Topic: Quick Communication - Rx Refill/Question >> Dec 31, 2017 10:23 AM Carolyn Stare wrote: Medication ALPRAZolam Duanne Moron) 0.25 MG tablet   [facility] [place]    Agent: Please be advised that RX refills may take up to 3 business days. We ask that you follow-up with your pharmacy.

## 2018-01-01 MED ORDER — ALPRAZOLAM 0.25 MG PO TABS: ORAL_TABLET | ORAL | 0 refills | Status: DC

## 2018-01-05 ENCOUNTER — Other Ambulatory Visit: Payer: Self-pay | Admitting: Nurse Practitioner

## 2018-01-07 ENCOUNTER — Encounter: Payer: Self-pay | Admitting: Nurse Practitioner

## 2018-01-08 ENCOUNTER — Other Ambulatory Visit: Payer: Self-pay

## 2018-01-08 DIAGNOSIS — B0229 Other postherpetic nervous system involvement: Secondary | ICD-10-CM

## 2018-01-08 MED ORDER — GABAPENTIN 100 MG PO CAPS: 100.0000 mg | ORAL_CAPSULE | Freq: Three times a day (TID) | ORAL | 1 refills | Status: DC

## 2018-01-12 ENCOUNTER — Ambulatory Visit (INDEPENDENT_AMBULATORY_CARE_PROVIDER_SITE_OTHER): Payer: Medicare Other | Admitting: Nurse Practitioner

## 2018-01-12 ENCOUNTER — Other Ambulatory Visit (INDEPENDENT_AMBULATORY_CARE_PROVIDER_SITE_OTHER): Payer: Medicare Other

## 2018-01-12 ENCOUNTER — Encounter: Payer: Self-pay | Admitting: Nurse Practitioner

## 2018-01-12 DIAGNOSIS — Z0000 Encounter for general adult medical examination without abnormal findings: Secondary | ICD-10-CM

## 2018-01-12 DIAGNOSIS — Z23 Encounter for immunization: Secondary | ICD-10-CM

## 2018-01-12 LAB — MAGNESIUM: MAGNESIUM: 1.7 mg/dL (ref 1.5–2.5)

## 2018-01-12 NOTE — Patient Instructions (Signed)
Please head downstairs for lab work/x-rays. If any of your test results are critically abnormal, you will be contacted right away. Your results may be released to your MyChart for viewing before I am able to provide you with my response. I will contact you within a week about your test results and any recommendations for abnormalities.  I will plan to see you back for your physical, or sooner if needed.

## 2018-01-12 NOTE — Progress Notes (Signed)
Name: Courtney Brown   MRN: [id number]    DOB: 1940/12/24   Date:01/12/2018       Progress Note  Subjective  Chief Complaint  Chief Complaint  Patient presents with  . Follow-up    magnesium level    HPI Courtney Brown is here today for follow up of hypomagnesemia, we have been following this over her past several visits, last magnesium level was 1.2 on 12/01/17 and she was instructed to start magnesium oxide 400mg  BID and return for follow up in 1 month. She is here today for follow up, reports she has been taking the magnesium oxide twice daily as instructed. She did notice feeling somewhat weak and shaky prior to starting the supplement, which has improved. She has experienced more regular bowel movements since staring the magnesium supplement, but otherwise no noted adverse effects and feels she is tolerating the supplement well. She denies syncope, confusion, abdominal pain, nausea, vomiting, constipation, diarrhea.   Patient Active Problem List   Diagnosis Date Noted  . Chronic bilateral low back pain with right-sided sciatica 09/01/2017  . Colon polyps 08/08/2016  . PVD (peripheral vascular disease) ([place]) 08/08/2016  . COPD (chronic obstructive pulmonary disease) ([place]) 07/29/2016  . HTN (hypertension), benign 07/29/2016  . Hyperlipidemia 07/29/2016  . CAD (coronary artery disease) 07/29/2016  . Post herpetic neuralgia 07/29/2016  . Adjustment disorder with mixed anxiety and depressed mood 07/29/2016  . Osteoporosis 07/29/2016  . Gastroesophageal reflux disease without esophagitis 07/29/2016  . Hearing loss 07/29/2016    Past Surgical History:  Procedure Laterality Date  . ABDOMINAL HYSTERECTOMY  1998  . 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 HERNIA REPAIR     umbilical, ventral  . INSERTION OF ILIAC STENT  2008    Family History  Problem Relation Age of Onset  . Hearing loss Father   . Hearing loss Maternal Aunt   . Hearing loss Maternal Grandmother   .  Cancer Maternal Grandmother        colon  . Arthritis Mother   . Cancer Paternal Grandmother        colon    Social History   Socioeconomic History  . Marital status: Widowed    Spouse name: Not on file  . Number of children: Not on file  . Years of education: Not on file  . Highest education level: Not on file  Occupational History  . Not on file  Social Needs  . Financial resource strain: Not on file  . Food insecurity:    Worry: Not on file    Inability: Not on file  . Transportation needs:    Medical: Not on file    Non-medical: Not on file  Tobacco Use  . Smoking status: Former Smoker    Packs/day: 1.50    Years: 15.00    Pack years: 22.50  . Smokeless tobacco: Never Used  . Tobacco comment: stop 04/05/2006  Substance and Sexual Activity  . Alcohol use: Yes    Alcohol/week: 1.2 oz    Types: 2 Shots of liquor per week    Comment: social, weekly  . Drug use: No  . Sexual activity: Not Currently  Lifestyle  . Physical activity:    Days per week: Not on file    Minutes per session: Not on file  . Stress: Not on file  Relationships  . Social connections:    Talks on phone: Not on file    Gets together: Not on file  Attends religious service: Not on file    Active member of club or organization: Not on file    Attends meetings of clubs or organizations: Not on file    Relationship status: Not on file  . Intimate partner violence:    Fear of current or ex partner: Not on file    Emotionally abused: Not on file    Physically abused: Not on file    Forced sexual activity: Not on file  Other Topics Concern  . Not on file  Social History Narrative  . Not on file     Current Outpatient Medications:  .  acetaminophen (TYLENOL) 500 MG tablet, Take 500 mg by mouth every 6 (six) hours as needed., Disp: , Rfl:  .  albuterol (PROAIR HFA) 108 (90 Base) MCG/ACT inhaler, Inhale 1-2 puffs into the lungs every 6 (six) hours as needed for wheezing or shortness of  breath., Disp: 1 Inhaler, Rfl: 5 .  ALPRAZolam (XANAX) 0.25 MG tablet, TAKE 1 TABLET BY MOUTH AT BEDTIME AS NEEDED FOR ANXIETY, Disp: 30 tablet, Rfl: 0 .  amLODipine (NORVASC) 10 MG tablet, Take 1 tablet (10 mg total) by mouth at bedtime., Disp: 90 tablet, Rfl: 1 .  aspirin 81 MG tablet, Take 81 mg by mouth daily., Disp: , Rfl:  .  citalopram (CELEXA) 20 MG tablet, Take 1 tablet (20 mg total) by mouth daily., Disp: 30 tablet, Rfl: 1 .  colesevelam (WELCHOL) 625 MG tablet, Take 1 tablet (625 mg total) by mouth 2 (two) times daily with a meal., Disp: 60 tablet, Rfl: 1 .  fenofibrate (TRICOR) 145 MG tablet, Take 1 tablet (145 mg total) by mouth daily., Disp: 90 tablet, Rfl: 1 .  furosemide (LASIX) 20 MG tablet, TAKE 1 TABLET BY MOUTH DAILY. FOR LEG EDEMA, Disp: 90 tablet, Rfl: 0 .  gabapentin (NEURONTIN) 100 MG capsule, Take 1 capsule (100 mg total) by mouth 3 (three) times daily., Disp: 90 capsule, Rfl: 1 .  Garlic Oil 9629 MG CAPS, Take 1,000 mg by mouth 1 day or 1 dose., Disp: , Rfl:  .  Lactobacillus (PROBIOTIC ACIDOPHILUS) CAPS, Take by mouth once., Disp: , Rfl:  .  loratadine (CLARITIN) 10 MG tablet, Take 1 tablet (10 mg total) by mouth daily., Disp: 90 tablet, Rfl: 0 .  lovastatin (MEVACOR) 40 MG tablet, Take 1 tablet (40 mg total) by mouth at bedtime., Disp: 90 tablet, Rfl: 1 .  magnesium oxide (MAG-OX) 400 MG tablet, TAKE 1 TABLET BY MOUTH TWICE A DAY, Disp: 60 tablet, Rfl: 0 .  metoprolol succinate (TOPROL-XL) 50 MG 24 hr tablet, Take 1 tablet (50 mg total) by mouth 2 (two) times daily., Disp: 180 tablet, Rfl: 1 .  mometasone (NASONEX) 50 MCG/ACT nasal spray, PLACE 2 SPRAYS INTO THE NOSE DAILY., Disp: 17 g, Rfl: 1 .  nystatin ointment (MYCOSTATIN), Apply 1 application topically 2 (two) times daily., Disp: 30 g, Rfl: 0 .  omeprazole (PRILOSEC) 20 MG capsule, TAKE 1 CAPSULE BY MOUTH EVERY DAY, Disp: 90 capsule, Rfl: 1 .  raloxifene (EVISTA) 60 MG tablet, Take 1 tablet (60 mg total) by mouth  daily., Disp: 90 tablet, Rfl: 1 .  SYMBICORT 160-4.5 MCG/ACT inhaler, Inhale 2 puffs into the lungs 2 (two) times daily., Disp: 1 Inhaler, Rfl: 2 .  tiotropium (SPIRIVA) 18 MCG inhalation capsule, Place 1 capsule (18 mcg total) into inhaler and inhale daily., Disp: 30 capsule, Rfl: 12 .  triamcinolone cream (KENALOG) 0.1 %, Apply 1 application topically 2 (two)  times daily., Disp: 30 g, Rfl: 0  Allergies  Allergen Reactions  . Prednisone Other (See Comments)    Leg pain,light headed     ROS See HPI  Objective  Vitals:   01/12/18 0957  BP: 140/62  Pulse: 60  Resp: 16  Temp: 98 F (36.7 C)  SpO2: 92%  Weight: 163 lb 12.8 oz (74.3 kg)  Height: 5\' 1"  (1.549 m)    Body mass index is 30.95 kg/m.  Physical Exam Vital signs reviewed. Constitutional: She isoriented to person, place, and time. She appearswell-developedand well-nourished.No distress.  HENT:  Head:Normocephalicand atraumatic.  Neck:Normal range of motion.Neck supple.  Cardiovascular:Normal rate,regular rhythm,normal heart soundsand intact distal pulses.She exhibits no edema. Pulmonary/Chest:Effort normal, lungs are clear. Norespiratory distress. Musculoskeletal: Normal range of motion. No gross deformities.  Neurological:She is alert and oriented to person, place, and time. Coordination, balance, strength, speech and gait are normal.  Skin: Skin is warm and dry. Psychiatric: She has anormal mood and affect. Herbehavior is normal.Judgmentand thought contentnormal.    Assessment & Plan RTC for CPE- scheduled in November  -Reviewed Health Maintenance: Need for 23-polyvalent pneumococcal polysaccharide vaccine- Pneumococcal polysaccharide vaccine 23-valent greater than or equal to 2yo subcutaneous/IM  Hypomagnesemia Continue magnesium supplement Update labs F/U with further recommendations pending lab results - Magnesium; Future

## 2018-01-15 ENCOUNTER — Telehealth: Payer: Self-pay

## 2018-01-15 NOTE — Telephone Encounter (Signed)
LVM for pt to call back.

## 2018-01-15 NOTE — Telephone Encounter (Signed)
Copied from [place] [phone]. Topic: Inquiry >> Jan 14, 2018 12:32 PM Oliver Pila B wrote: Reason for CRM: pt states she received a VM from her pcp/nurse and couldn't understand the message, contact pt to advise  >> Jan 14, 2018  1:24 PM Morey Hummingbird wrote: Did you call?

## 2018-01-18 ENCOUNTER — Ambulatory Visit (INDEPENDENT_AMBULATORY_CARE_PROVIDER_SITE_OTHER)
Admission: RE | Admit: 2018-01-18 | Discharge: 2018-01-18 | Disposition: A | Discharge status: Home / Self Care | Payer: Medicare Other | Source: Ambulatory Visit | Attending: Internal Medicine | Admitting: Internal Medicine

## 2018-01-18 ENCOUNTER — Ambulatory Visit (INDEPENDENT_AMBULATORY_CARE_PROVIDER_SITE_OTHER): Payer: Medicare Other | Admitting: Internal Medicine

## 2018-01-18 ENCOUNTER — Encounter: Payer: Self-pay | Admitting: Internal Medicine

## 2018-01-18 VITALS — BP 114/60 | HR 60 | Ht 60.0 in | Wt 162.0 lb

## 2018-01-18 DIAGNOSIS — J449 Chronic obstructive pulmonary disease, unspecified: Secondary | ICD-10-CM

## 2018-01-18 DIAGNOSIS — J9612 Chronic respiratory failure with hypercapnia: Secondary | ICD-10-CM | POA: Diagnosis not present

## 2018-01-18 DIAGNOSIS — J439 Emphysema, unspecified: Secondary | ICD-10-CM | POA: Diagnosis not present

## 2018-01-18 MED ORDER — SPIRIVA RESPIMAT 2.5 MCG/ACT IN AERS: 2.0000 | INHALATION_SPRAY | Freq: Every day | RESPIRATORY_TRACT | 0 refills | Status: DC

## 2018-01-18 MED ORDER — SPIRIVA RESPIMAT 2.5 MCG/ACT IN AERS: 2.0000 | INHALATION_SPRAY | Freq: Every day | RESPIRATORY_TRACT | 11 refills | Status: DC

## 2018-01-18 NOTE — Patient Instructions (Addendum)
Plan A = Automatic = symbicort 160 Take 2 puffs first thing in am and then another 2 puffs about 12 hours later.  spiriva 2 pffs each am   Work on inhaler technique:  relax and gently blow all the way out then take a nice smooth deep breath back in, triggering the inhaler at same time you start breathing in.  Hold for up to 5 seconds if you can. Blow symbicort  out thru nose. Rinse and gargle with water when done.     Plan B = Backup Only use your albuterol as a rescue medication to be used if you can't catch your breath by resting or doing a relaxed purse lip breathing pattern.  - The less you use it, the better it will work when you need it. - Ok to use the inhaler up to 2 puffs  every 4 hours if you must but call for appointment if use goes up over your usual need - Don't leave home without it !!  (think of it like the spare tire for your car)   Please remember to go to the  x-ray department downstairs in the basement  for your tests - we will call you with the results when they are available.      Please schedule a follow up office visit in 6 weeks, call sooner if needed with PFTs on return

## 2018-01-18 NOTE — Progress Notes (Signed)
Subjective:     Patient ID: Courtney Brown, female   DOB: 04-18-1941       MRN: [id number]  HPI    89 yowf quit smoking in 2007 @ wt around 155 with tendency to URI's / bronchitis and really bad spell in 2012 and started on symbicort then and 2018 added spiriva and helped some but PCP noted wheezing  Per pt despite symb/spiriva so referred to pulmonary clinic 01/18/2018 by Caesar Chestnut      01/18/2018 1st [facility] office visit/ Mechel Schutter  On symb and spiriva dpi  Chief Complaint  Patient presents with  . Pulmonary Consult    Referred by Caesar Chestnut, NP.   Pt states she was dxed with COPD several years ago.  She states she gets winded taking her trashcan to the street.  She uses proair 3 x per wk on average.   doe progessively worse x one year prior to OV   mb to house 150 ft slt elevation to street legs give out and sob about the same point in the walk  [facility] = can't walk 100 yards even at a slow pace at a flat grade s stopping due to legs > sob  Mopping and vacuuming x 5 min and difficult to finish a room / better if stops and uses saba but never rechallenges  Assoc trouble nasal congestion better with clariton / reaction to prednisone in past but doesn't remember specifics   No obvious day to day or daytime variability or assoc excess/ purulent sputum or mucus plugs or hemoptysis or cp or chest tightness, subjective wheeze or overt sinus or hb symptoms.   Sleeps on one pillow on side on couch  without nocturnal  or early am exacerbation  of respiratory  c/o's or need for noct saba. Also denies any obvious fluctuation of symptoms with weather or environmental changes or other aggravating or alleviating factors except as outlined above   No unusual exposure hx or h/o childhood pna/ asthma or knowledge of premature birth.  Current Allergies, Complete Past Medical History, Past Surgical History, Family History, and Social History were reviewed in [employer] record.  ROS  The following are not active complaints unless bolded Hoarseness, sore throat, dysphagia, dental problems, itching, sneezing,  nasal congestion or discharge of excess mucus or purulent secretions, ear ache,   fever, chills, sweats, unintended wt loss or wt gain, classically pleuritic or exertional cp,  orthopnea pnd or arm/hand swelling  or leg swelling, presyncope, palpitations, abdominal pain, anorexia, nausea, vomiting, diarrhea  or change in bowel habits or change in bladder habits, change in stools or change in urine, dysuria, hematuria,  rash, arthralgias, visual complaints, headache, numbness, weakness or ataxia or problems with walking or coordination,  change in mood or  memory.        Current Meds  Medication Sig  . acetaminophen (TYLENOL) 500 MG tablet Take 500 mg by mouth every 6 (six) hours as needed.  Marland Kitchen albuterol (PROAIR HFA) 108 (90 Base) MCG/ACT inhaler Inhale 1-2 puffs into the lungs every 6 (six) hours as needed for wheezing or shortness of breath.  . ALPRAZolam (XANAX) 0.25 MG tablet TAKE 1 TABLET BY MOUTH AT BEDTIME AS NEEDED FOR ANXIETY  . amLODipine (NORVASC) 10 MG tablet Take 1 tablet (10 mg total) by mouth at bedtime.  Marland Kitchen aspirin 81 MG tablet Take 81 mg by mouth daily.  . citalopram (CELEXA) 20 MG tablet Take 1 tablet (20 mg total) by mouth  daily.  . colesevelam (WELCHOL) 625 MG tablet Take 1 tablet (625 mg total) by mouth 2 (two) times daily with a meal.  . fenofibrate (TRICOR) 145 MG tablet Take 1 tablet (145 mg total) by mouth daily.  . furosemide (LASIX) 20 MG tablet TAKE 1 TABLET BY MOUTH DAILY. FOR LEG EDEMA  . gabapentin (NEURONTIN) 100 MG capsule Take 1 capsule (100 mg total) by mouth 3 (three) times daily.  . Garlic Oil 4627 MG CAPS Take 1,000 mg by mouth 1 day or 1 dose.  . Lactobacillus (PROBIOTIC ACIDOPHILUS) CAPS Take by mouth once.  . loratadine (CLARITIN) 10 MG tablet Take 1 tablet (10 mg total) by mouth daily.  Marland Kitchen lovastatin (MEVACOR)  40 MG tablet Take 1 tablet (40 mg total) by mouth at bedtime.  . magnesium oxide (MAG-OX) 400 MG tablet TAKE 1 TABLET BY MOUTH TWICE A DAY  . metoprolol succinate (TOPROL-XL) 50 MG 24 hr tablet Take 1 tablet (50 mg total) by mouth 2 (two) times daily.  . mometasone (NASONEX) 50 MCG/ACT nasal spray PLACE 2 SPRAYS INTO THE NOSE DAILY.  Marland Kitchen nystatin ointment (MYCOSTATIN) Apply 1 application topically 2 (two) times daily.  Marland Kitchen omeprazole (PRILOSEC) 20 MG capsule TAKE 1 CAPSULE BY MOUTH EVERY DAY  . raloxifene (EVISTA) 60 MG tablet Take 1 tablet (60 mg total) by mouth daily.  . SYMBICORT 160-4.5 MCG/ACT inhaler Inhale 2 puffs into the lungs 2 (two) times daily.  Marland Kitchen triamcinolone cream (KENALOG) 0.1 % Apply 1 application topically 2 (two) times daily.  . [ ]  tiotropium (SPIRIVA) 18 MCG inhalation capsule Place 1 capsule (18 mcg total) into inhaler and inhale daily.                    Review of Systems     Objective:   Physical Exam    amb obese wf nad   Wt Readings from Last 3 Encounters:  01/18/18 162 lb (73.5 kg)  01/12/18 163 lb 12.8 oz (74.3 kg)  12/01/17 164 lb (74.4 kg)     Vital signs reviewed - Note on arrival 02 sats  93% on RA       HEENT: nl dentition / oropharynx. Nl external ear canals without cough reflex - moderate bilateral non-specific turbinate edema     NECK :  without JVD/Nodes/TM/ nl carotid upstrokes bilaterally   LUNGS: no acc muscle use,  Mild barrel  contour chest wall with bilateral  slt Distant bs s audible wheeze and  without cough on insp or exp maneuver and mildd   Hyperresonant  to  percussion bilaterally     CV:  RRR  no s3 or murmur or increase in P2, and no edema   ABD:  soft and nontender with pos mid insp Hoover's  in the supine position. No bruits or organomegaly appreciated, bowel sounds nl  MS:   Nl gait/  ext warm without deformities, calf tenderness, cyanosis or clubbing No obvious joint restrictions   SKIN: warm and dry  without lesions    NEURO:  alert, approp, nl sensorium with  no motor or cerebellar deficits apparent.           CXR PA and Lateral:   01/18/2018 :    I personally reviewed images and   impression as follows:    mild copd changes/ mild CM       Assessment:

## 2018-01-19 ENCOUNTER — Encounter: Payer: Self-pay | Admitting: Internal Medicine

## 2018-01-19 ENCOUNTER — Other Ambulatory Visit: Payer: Self-pay | Admitting: Nurse Practitioner

## 2018-01-19 DIAGNOSIS — J9612 Chronic respiratory failure with hypercapnia: Secondary | ICD-10-CM | POA: Insufficient documentation

## 2018-01-19 DIAGNOSIS — E782 Mixed hyperlipidemia: Secondary | ICD-10-CM

## 2018-01-19 NOTE — Assessment & Plan Note (Signed)
HC03  12/01/17  = 34  C/w mild hypercarbia which is well compensated clinically so no specific rx needed

## 2018-01-19 NOTE — Assessment & Plan Note (Addendum)
PFT's  10/29/16  FEV1 0.99 (54 % ) ratio 63  p 33 % improvement from saba p ? prior to study with DLCO  63/60 % corrects to 90 % for alv volume  With classic curvature and ERV 18%   - 01/18/2018  After extensive coaching inhaler device  effectiveness =    75% try spriva smi 2.5 mcg 2 each am    Group D in terms of symptom/risk and laba/lama/ICS  therefore appropriate rx at this point but I suspect the dpi form of spiriva may not be that effective here and may do better on smi if she can master it then return here in 6 weeks for pfts    Discussed with pt:  Formulary restrictions will be an ongoing challenge for the forseable future and I would be happy to pick an alternative if the pt will first  provide me a list of them but pt  will need to return here for training for any new device that is required eg dpi vs hfa vs respimat.    In meantime we can always provide samples so the patient never runs out of any needed respiratory medications.   Total time devoted to counseling  > 50 % of initial 60 min office visit:  review case with pt/ discussion of options/alternatives/ personally creating written customized instructions  in presence of pt  then going over those specific  Instructions directly with the pt including how to use all of the meds but in particular covering each new medication in detail and the difference between the maintenance= "automatic" meds and the prns using an action plan format for the latter (If this problem/symptom => do that organization reading Left to right).  Please see AVS from this visit for a full list of these instructions which I personally wrote for this pt and  are unique to this visit.   See device teaching which extended face to face time for this visit

## 2018-01-19 NOTE — Progress Notes (Signed)
LMTCB

## 2018-01-20 NOTE — Progress Notes (Signed)
LMTCB

## 2018-01-25 NOTE — Progress Notes (Signed)
Letter sent per protocol for results

## 2018-01-27 ENCOUNTER — Other Ambulatory Visit: Payer: Self-pay | Admitting: Nurse Practitioner

## 2018-01-31 ENCOUNTER — Other Ambulatory Visit: Payer: Self-pay | Admitting: Nurse Practitioner

## 2018-01-31 DIAGNOSIS — F4323 Adjustment disorder with mixed anxiety and depressed mood: Secondary | ICD-10-CM

## 2018-02-01 ENCOUNTER — Telehealth: Payer: Self-pay | Admitting: Internal Medicine

## 2018-02-01 MED ORDER — ALPRAZOLAM 0.25 MG PO TABS: ORAL_TABLET | ORAL | 0 refills | Status: DC

## 2018-02-01 NOTE — Telephone Encounter (Signed)
Notes recorded by Tanda Rockers, MD on 01/19/2018 at 8:25 AM EDT Call pt: Reviewed cxr and no acute change so no change in recommendations made at [facility] with pt and notified of results per Dr. Melvyn Novas. Pt verbalized understanding and denied any questions.

## 2018-02-01 NOTE — Telephone Encounter (Signed)
Last refill was 01/01/18 per database please advise.

## 2018-02-09 ENCOUNTER — Other Ambulatory Visit: Payer: Self-pay | Admitting: Nurse Practitioner

## 2018-02-09 DIAGNOSIS — B0229 Other postherpetic nervous system involvement: Secondary | ICD-10-CM

## 2018-02-18 ENCOUNTER — Other Ambulatory Visit: Payer: Self-pay | Admitting: Nurse Practitioner

## 2018-02-18 DIAGNOSIS — E782 Mixed hyperlipidemia: Secondary | ICD-10-CM

## 2018-02-19 ENCOUNTER — Other Ambulatory Visit: Payer: Self-pay | Admitting: Nurse Practitioner

## 2018-03-01 ENCOUNTER — Other Ambulatory Visit: Payer: Self-pay | Admitting: Nurse Practitioner

## 2018-03-01 DIAGNOSIS — F4323 Adjustment disorder with mixed anxiety and depressed mood: Secondary | ICD-10-CM

## 2018-03-02 ENCOUNTER — Other Ambulatory Visit: Payer: Self-pay | Admitting: Nurse Practitioner

## 2018-03-02 DIAGNOSIS — F4323 Adjustment disorder with mixed anxiety and depressed mood: Secondary | ICD-10-CM

## 2018-03-03 ENCOUNTER — Encounter: Payer: Self-pay | Admitting: Internal Medicine

## 2018-03-03 ENCOUNTER — Ambulatory Visit: Payer: Medicare Other | Admitting: Internal Medicine

## 2018-03-03 ENCOUNTER — Ambulatory Visit (INDEPENDENT_AMBULATORY_CARE_PROVIDER_SITE_OTHER): Payer: Medicare Other | Admitting: Internal Medicine

## 2018-03-03 VITALS — BP 102/60 | HR 82 | Ht 60.0 in | Wt 164.0 lb

## 2018-03-03 DIAGNOSIS — J449 Chronic obstructive pulmonary disease, unspecified: Secondary | ICD-10-CM

## 2018-03-03 LAB — PULMONARY FUNCTION TEST
DL/VA % PRED: 94 %
DL/VA: 4.02 ml/min/mmHg/L
DLCO unc % pred: 67 %
DLCO unc: 12.8 ml/min/mmHg
FEF 25-75 POST: 0.66 L/s
FEF 25-75 Pre: 0.64 L/sec
FEF2575-%Change-Post: 4 %
FEF2575-%PRED-POST: 48 %
FEF2575-%Pred-Pre: 46 %
FEV1-%CHANGE-POST: 0 %
FEV1-%PRED-PRE: 69 %
FEV1-%Pred-Post: 69 %
FEV1-POST: 1.17 L
FEV1-PRE: 1.18 L
FEV1FVC-%Change-Post: -1 %
FEV1FVC-%PRED-PRE: 91 %
FEV6-%Change-Post: 0 %
FEV6-%PRED-POST: 80 %
FEV6-%Pred-Pre: 80 %
FEV6-POST: 1.73 L
FEV6-PRE: 1.72 L
FEV6FVC-%Change-Post: 0 %
FEV6FVC-%PRED-POST: 105 %
FEV6FVC-%PRED-PRE: 106 %
FVC-%Change-Post: 1 %
FVC-%PRED-POST: 76 %
FVC-%PRED-PRE: 75 %
FVC-POST: 1.74 L
FVC-PRE: 1.72 L
POST FEV1/FVC RATIO: 67 %
PRE FEV6/FVC RATIO: 100 %
Post FEV6/FVC ratio: 99 %
Pre FEV1/FVC ratio: 68 %
RV % PRED: 113 %
RV: 2.4 L
TLC % pred: 97 %
TLC: 4.32 L

## 2018-03-03 MED ORDER — ALPRAZOLAM 0.25 MG PO TABS: ORAL_TABLET | ORAL | 0 refills | Status: DC

## 2018-03-03 NOTE — Assessment & Plan Note (Signed)
PFT's  10/29/16  FEV1 0.99 (54 % ) ratio 63  p 33 % improvement from saba p ? prior to study with DLCO  63/60 % corrects to 90 % for alv volume  With classic curvature and ERV 18%  - 01/18/2018    try spriva smi 2.5  - PFT's  03/03/2018  FEV1  1.17 (69 % ) ratio 67  p 0 % improvement from saba p symb/spiriva  prior to study with DLCO  67 % corrects to 94 % for alv volume   - 03/03/2018  After extensive coaching inhaler device  effectiveness =    90% with spacer   Pt has improved but still  Group D in terms of symptom/risk and laba/lama/ICS  therefore appropriate rx at this point.  Could consider transition to stiolto if remains exac free over time   Pulmonary f/u is prn    I had an extended discussion with the patient reviewing all relevant studies completed to date and  lasting 15 to 20 minutes of a 25 minute visit    See device teaching which extended face to face time for this visit.  Each maintenance medication was reviewed in detail including emphasizing most importantly the difference between maintenance and prns and under what circumstances the prns are to be triggered using an action plan format that is not reflected in the computer generated alphabetically organized AVS which I have not found useful in most complex patients, especially with respiratory illnesses  Please see AVS for specific instructions unique to this visit that I personally wrote and verbalized to the the pt in detail and then reviewed with pt  by my nurse highlighting any  changes in therapy recommended at today's visit to their plan of care.

## 2018-03-03 NOTE — Progress Notes (Signed)
Subjective:     Patient ID: Courtney Brown, female   DOB: 17-Oct-1940       MRN: [id number]     Brief patient profile:  40 yowf quit smoking in 2007 @ wt around 155 with tendency to URI's / bronchitis and really bad spell in 2012 and started on symbicort then and 2018 added spiriva and helped some but PCP noted wheezing  Per pt despite symb/spiriva so referred to pulmonary clinic 01/18/2018 by Caesar Chestnut     History of Present Illness  01/18/2018 1st [facility] office visit/ Courtney Brown  On symb and spiriva dpi  Chief Complaint  Patient presents with  . Pulmonary Consult    Referred by Caesar Chestnut, NP.   Pt states she was dxed with COPD several years ago.  She states she gets winded taking her trashcan to the street.  She uses proair 3 x per wk on average.   doe progessively worse x one year prior to OV   mb to house 150 ft slt elevation to street legs give out and sob about the same point in the walk  [facility] = can't walk 100 yards even at a slow pace at a flat grade s stopping due to legs > sob  Mopping and vacuuming x 5 min and difficult to finish a room / better if stops and uses saba but never rechallenges  Assoc trouble nasal congestion better with clariton / reaction to prednisone in past but doesn't remember specifics  rec Plan A = Automatic = symbicort 160 Take 2 puffs first thing in am and then another 2 puffs about 12 hours later.  spiriva 2 pffs each am  Work on inhaler technique:  relax and gently blow all the way out then take a nice smooth deep breath back in, triggering the inhaler at same time you start breathing in.  Hold for up to 5 seconds if you can. Blow symbicort  out thru nose. Rinse and gargle with water when done.  Plan B = Backup Only use your albuterol as a rescue medication  Please remember to go to the  x-ray department downstairs in the basement  for your tests - we will call you with the results when they are available.     03/03/2018  f/u  ov/Courtney Brown re: GOLD II  Chief Complaint  Patient presents with  . Follow-up    COPD >> Breathing is improved slightly since her last OV. Feels like Spiriva Respimat is working better. PFT completed today.   Dyspnea:  mb to house easier  Cough: no  SABA use: rarely 02: no   No obvious day to day or daytime variability or assoc excess/ purulent sputum or mucus plugs or hemoptysis or cp or chest tightness, subjective wheeze or overt sinus or hb symptoms.   Sleeping: on side one pillow without nocturnal  or early am exacerbation  of respiratory  c/o's or need for noct saba. Also denies any obvious fluctuation of symptoms with weather or environmental changes or other aggravating or alleviating factors except as outlined above   No unusual exposure hx or h/o childhood pna/ asthma or knowledge of premature birth.  Current Allergies, Complete Past Medical History, Past Surgical History, Family History, and Social History were reviewed in [employer] record.  ROS  The following are not active complaints unless bolded Hoarseness, sore throat, dysphagia, dental problems, itching, sneezing,  nasal congestion or discharge of excess mucus or purulent secretions, ear ache,   fever,  chills, sweats, unintended wt loss or wt gain, classically pleuritic or exertional cp,  orthopnea pnd or arm/hand swelling  or leg swelling, presyncope, palpitations, abdominal pain, anorexia, nausea, vomiting, diarrhea  or change in bowel habits or change in bladder habits, change in stools or change in urine, dysuria, hematuria,  rash, arthralgias, visual complaints, headache, numbness, weakness or ataxia or problems with walking or coordination,  change in mood or  memory.        Current Meds  Medication Sig  . acetaminophen (TYLENOL) 500 MG tablet Take 500 mg by mouth every 6 (six) hours as needed.  Marland Kitchen albuterol (PROAIR HFA) 108 (90 Base) MCG/ACT inhaler Inhale 1-2 puffs into the lungs every 6 (six)  hours as needed for wheezing or shortness of breath.  . ALPRAZolam (XANAX) 0.25 MG tablet TAKE 1 TABLET BY MOUTH AT BEDTIME AS NEEDED FOR ANXIETY  . amLODipine (NORVASC) 10 MG tablet Take 1 tablet (10 mg total) by mouth at bedtime.  Marland Kitchen aspirin 81 MG tablet Take 81 mg by mouth daily.  . citalopram (CELEXA) 20 MG tablet TAKE 1 TABLET BY MOUTH EVERY DAY  . colesevelam (WELCHOL) 625 MG tablet TAKE 1 TABLET (625 MG TOTAL) BY MOUTH 2 (TWO) TIMES DAILY WITH A MEAL.  . colesevelam (WELCHOL) 625 MG tablet TAKE 1 TABLET (625 MG TOTAL) BY MOUTH 2 (TWO) TIMES DAILY WITH A MEAL.  . fenofibrate (TRICOR) 145 MG tablet Take 1 tablet (145 mg total) by mouth daily.  . furosemide (LASIX) 20 MG tablet TAKE 1 TABLET BY MOUTH DAILY. FOR LEG EDEMA  . gabapentin (NEURONTIN) 100 MG capsule Take 1 capsule (100 mg total) by mouth 3 (three) times daily. Annual appt due in Oct must see provider for future refills  . Garlic Oil 4709 MG CAPS Take 1,000 mg by mouth 1 day or 1 dose.  . Lactobacillus (PROBIOTIC ACIDOPHILUS) CAPS Take by mouth once.  . loratadine (CLARITIN) 10 MG tablet Take 1 tablet (10 mg total) by mouth daily.  Marland Kitchen lovastatin (MEVACOR) 40 MG tablet Take 1 tablet (40 mg total) by mouth at bedtime.  . magnesium oxide (MAG-OX) 400 MG tablet TAKE 1 TABLET BY MOUTH TWICE A DAY  . metoprolol succinate (TOPROL-XL) 50 MG 24 hr tablet Take 1 tablet (50 mg total) by mouth 2 (two) times daily.  . mometasone (NASONEX) 50 MCG/ACT nasal spray PLACE 2 SPRAYS INTO THE NOSE DAILY.  Marland Kitchen nystatin ointment (MYCOSTATIN) Apply 1 application topically 2 (two) times daily.  Marland Kitchen omeprazole (PRILOSEC) 20 MG capsule TAKE 1 CAPSULE BY MOUTH EVERY DAY  . raloxifene (EVISTA) 60 MG tablet Take 1 tablet (60 mg total) by mouth daily.  Marland Kitchen SPIRIVA RESPIMAT 2.5 MCG/ACT AERS Inhale 2 puffs into the lungs daily.  . SYMBICORT 160-4.5 MCG/ACT inhaler Inhale 2 puffs into the lungs 2 (two) times daily.  Marland Kitchen triamcinolone cream (KENALOG) 0.1 % Apply 1  application topically 2 (two) times daily.                                    Objective:   Physical Exam  amb mod obese wf nad   03/03/2018          164   01/18/18 162 lb (73.5 kg)  01/12/18 163 lb 12.8 oz (74.3 kg)  12/01/17 164 lb (74.4 kg)    Vital signs reviewed - Note on arrival 02 sats  94% on RA  HEENT: nl dentition / oropharynx. Nl external ear canals without cough reflex - mild  bilateral non-specific turbinate edema     NECK :  without JVD/Nodes/TM/ nl carotid upstrokes bilaterally   LUNGS: no acc muscle use,  Mild barrel  contour chest wall with bilateral  Distant bs s audible wheeze and  without cough on insp or exp maneuver and mild   Hyperresonant  to  percussion bilaterally     CV:  RRR  no s3 or murmur or increase in P2, and no edema   ABD:  soft and nontender with pos late  insp Hoover's  in the supine position. No bruits or organomegaly appreciated, bowel sounds nl  MS:   Nl gait/  ext warm without deformities, calf tenderness, cyanosis or clubbing No obvious joint restrictions   SKIN: warm and dry without lesions    NEURO:  alert, approp, nl sensorium with  no motor or cerebellar deficits apparent.                Assessment:

## 2018-03-03 NOTE — Patient Instructions (Addendum)
Plan A = Automatic = Symbicort 160 Take 2 puffs first thing in am and then another 2 puffs about 12 hours later.  Spiriva 2 puffs each am after your symbicort   Plan B = Backup Only use your albuterol(PROAIR)  as a rescue medication to be used if you can't catch your breath by resting or doing a relaxed purse lip breathing pattern.  - The less you use it, the better it will work when you need it. - Ok to use the inhaler up to 2 puffs  every 4 hours if you must but call for appointment if use goes up over your usual need - Don't leave home without it !!  (think of it like the spare tire for your car)     If you are satisfied with your treatment plan,  let your doctor know and he/she can either refill your medications or you can return here when your prescription runs out.     If in any way you are not 100% satisfied,  please tell us.  If 100% better, tell your friends!  Pulmonary follow up is as needed

## 2018-03-03 NOTE — Telephone Encounter (Signed)
Last refill was 02/01/18 per database.

## 2018-03-03 NOTE — Progress Notes (Signed)
PFT completed today.  

## 2018-03-14 ENCOUNTER — Other Ambulatory Visit: Payer: Self-pay | Admitting: Nurse Practitioner

## 2018-03-14 DIAGNOSIS — E782 Mixed hyperlipidemia: Secondary | ICD-10-CM

## 2018-03-16 ENCOUNTER — Other Ambulatory Visit: Payer: Self-pay | Admitting: Nurse Practitioner

## 2018-03-16 DIAGNOSIS — M810 Age-related osteoporosis without current pathological fracture: Secondary | ICD-10-CM

## 2018-03-30 ENCOUNTER — Other Ambulatory Visit: Payer: Self-pay | Admitting: Nurse Practitioner

## 2018-03-30 ENCOUNTER — Ambulatory Visit
Admission: RE | Admit: 2018-03-30 | Discharge: 2018-03-30 | Disposition: A | Discharge status: Home / Self Care | Payer: Medicare Other | Source: Ambulatory Visit | Attending: Nurse Practitioner | Admitting: Nurse Practitioner

## 2018-03-30 DIAGNOSIS — F4323 Adjustment disorder with mixed anxiety and depressed mood: Secondary | ICD-10-CM

## 2018-03-30 DIAGNOSIS — Z1231 Encounter for screening mammogram for malignant neoplasm of breast: Secondary | ICD-10-CM | POA: Diagnosis not present

## 2018-03-30 DIAGNOSIS — Z1239 Encounter for other screening for malignant neoplasm of breast: Secondary | ICD-10-CM

## 2018-03-30 HISTORY — DX: Other signs and symptoms in breast: N64.59

## 2018-04-01 NOTE — Telephone Encounter (Signed)
[place] controlled database checked. No info found for patient. Ok to Rf in PCP's absence? Thanks!

## 2018-04-03 MED ORDER — ALPRAZOLAM 0.25 MG PO TABS: ORAL_TABLET | ORAL | 0 refills | Status: DC

## 2018-04-08 ENCOUNTER — Other Ambulatory Visit: Payer: Self-pay | Admitting: Nurse Practitioner

## 2018-04-08 DIAGNOSIS — E782 Mixed hyperlipidemia: Secondary | ICD-10-CM

## 2018-04-14 ENCOUNTER — Other Ambulatory Visit: Payer: Self-pay | Admitting: Nurse Practitioner

## 2018-04-16 MED ORDER — MAGNESIUM OXIDE 400 MG PO TABS: ORAL_TABLET | ORAL | 0 refills | Status: DC

## 2018-04-27 ENCOUNTER — Other Ambulatory Visit: Payer: Self-pay | Admitting: Nurse Practitioner

## 2018-04-27 DIAGNOSIS — J449 Chronic obstructive pulmonary disease, unspecified: Secondary | ICD-10-CM

## 2018-05-03 ENCOUNTER — Other Ambulatory Visit: Payer: Self-pay | Admitting: Internal Medicine

## 2018-05-03 DIAGNOSIS — F4323 Adjustment disorder with mixed anxiety and depressed mood: Secondary | ICD-10-CM

## 2018-05-03 MED ORDER — ALPRAZOLAM 0.25 MG PO TABS: ORAL_TABLET | ORAL | 0 refills | Status: DC

## 2018-05-03 NOTE — Telephone Encounter (Signed)
[place] Controlled Database Checked Last filled: No history LOV w/you: 01/12/18 Next appt w/you: 06/03/18

## 2018-05-08 ENCOUNTER — Other Ambulatory Visit: Payer: Self-pay | Admitting: Nurse Practitioner

## 2018-05-08 DIAGNOSIS — B0229 Other postherpetic nervous system involvement: Secondary | ICD-10-CM

## 2018-05-18 ENCOUNTER — Other Ambulatory Visit: Payer: Self-pay | Admitting: Nurse Practitioner

## 2018-05-19 MED ORDER — MAGNESIUM OXIDE 400 MG PO TABS: ORAL_TABLET | ORAL | 0 refills | Status: DC

## 2018-05-20 ENCOUNTER — Other Ambulatory Visit: Payer: Self-pay | Admitting: Nurse Practitioner

## 2018-05-20 DIAGNOSIS — E782 Mixed hyperlipidemia: Secondary | ICD-10-CM

## 2018-05-20 DIAGNOSIS — I10 Essential (primary) hypertension: Secondary | ICD-10-CM

## 2018-05-23 ENCOUNTER — Other Ambulatory Visit: Payer: Self-pay | Admitting: Nurse Practitioner

## 2018-05-23 DIAGNOSIS — K219 Gastro-esophageal reflux disease without esophagitis: Secondary | ICD-10-CM

## 2018-05-23 DIAGNOSIS — E782 Mixed hyperlipidemia: Secondary | ICD-10-CM

## 2018-05-24 MED ORDER — LORATADINE 10 MG PO TABS: 10.0000 mg | ORAL_TABLET | Freq: Every day | ORAL | 1 refills | Status: DC

## 2018-05-27 ENCOUNTER — Other Ambulatory Visit: Payer: Self-pay | Admitting: Nurse Practitioner

## 2018-05-27 DIAGNOSIS — E782 Mixed hyperlipidemia: Secondary | ICD-10-CM

## 2018-05-29 ENCOUNTER — Other Ambulatory Visit: Payer: Self-pay | Admitting: Nurse Practitioner

## 2018-05-29 DIAGNOSIS — I10 Essential (primary) hypertension: Secondary | ICD-10-CM

## 2018-06-01 ENCOUNTER — Other Ambulatory Visit: Payer: Self-pay | Admitting: Nurse Practitioner

## 2018-06-01 DIAGNOSIS — F4323 Adjustment disorder with mixed anxiety and depressed mood: Secondary | ICD-10-CM

## 2018-06-02 MED ORDER — ALPRAZOLAM 0.25 MG PO TABS: ORAL_TABLET | ORAL | 0 refills | Status: DC

## 2018-06-02 NOTE — Telephone Encounter (Signed)
[place] Controlled Database Checked Last filled: 05/03/18 # 30 LOV w/you: 01/12/18 Next appt w/you: 06/03/18

## 2018-06-03 ENCOUNTER — Other Ambulatory Visit (INDEPENDENT_AMBULATORY_CARE_PROVIDER_SITE_OTHER): Payer: Medicare Other

## 2018-06-03 ENCOUNTER — Ambulatory Visit (INDEPENDENT_AMBULATORY_CARE_PROVIDER_SITE_OTHER): Payer: Medicare Other | Admitting: Nurse Practitioner

## 2018-06-03 ENCOUNTER — Encounter: Payer: Self-pay | Admitting: Nurse Practitioner

## 2018-06-03 VITALS — BP 110/52 | HR 62 | Ht 60.0 in | Wt 163.0 lb

## 2018-06-03 DIAGNOSIS — E782 Mixed hyperlipidemia: Secondary | ICD-10-CM | POA: Diagnosis not present

## 2018-06-03 DIAGNOSIS — Z0001 Encounter for general adult medical examination with abnormal findings: Secondary | ICD-10-CM

## 2018-06-03 DIAGNOSIS — Z23 Encounter for immunization: Secondary | ICD-10-CM | POA: Diagnosis not present

## 2018-06-03 DIAGNOSIS — I10 Essential (primary) hypertension: Secondary | ICD-10-CM | POA: Diagnosis not present

## 2018-06-03 DIAGNOSIS — K219 Gastro-esophageal reflux disease without esophagitis: Secondary | ICD-10-CM

## 2018-06-03 DIAGNOSIS — Z7189 Other specified counseling: Secondary | ICD-10-CM | POA: Insufficient documentation

## 2018-06-03 DIAGNOSIS — F4323 Adjustment disorder with mixed anxiety and depressed mood: Secondary | ICD-10-CM

## 2018-06-03 DIAGNOSIS — M810 Age-related osteoporosis without current pathological fracture: Secondary | ICD-10-CM

## 2018-06-03 DIAGNOSIS — H9190 Unspecified hearing loss, unspecified ear: Secondary | ICD-10-CM

## 2018-06-03 LAB — COMPREHENSIVE METABOLIC PANEL
ALBUMIN: 3.5 g/dL (ref 3.5–5.2)
ALT: 10 U/L (ref 0–35)
AST: 13 U/L (ref 0–37)
Alkaline Phosphatase: 42 U/L (ref 39–117)
BUN: 19 mg/dL (ref 6–23)
CALCIUM: 8.8 mg/dL (ref 8.4–10.5)
CHLORIDE: 103 meq/L (ref 96–112)
CO2: 32 mEq/L (ref 19–32)
Creatinine, Ser: 1.03 mg/dL (ref 0.40–1.20)
GFR: 55.25 mL/min — ABNORMAL LOW (ref >60.00)
Glucose, Bld: 124 mg/dL — ABNORMAL HIGH (ref 70–99)
Potassium: 3.5 mEq/L (ref 3.5–5.1)
SODIUM: 142 meq/L (ref 135–145)
Total Bilirubin: 0.4 mg/dL (ref 0.2–1.2)
Total Protein: 6.5 g/dL (ref 6.0–8.3)

## 2018-06-03 LAB — CBC
HEMATOCRIT: 43.7 % (ref 36.0–46.0)
Hemoglobin: 14.2 g/dL (ref 12.0–15.0)
MCHC: 32.5 g/dL (ref 30.0–36.0)
MCV: 91.6 fl (ref 78.0–100.0)
PLATELETS: 279 10*3/uL (ref 150.0–400.0)
RBC: 4.77 Mil/uL (ref 3.87–5.11)
RDW: 13.7 % (ref 11.5–15.5)
WBC: 9.2 10*3/uL (ref 4.0–10.5)

## 2018-06-03 LAB — LIPID PANEL
Cholesterol: 122 mg/dL (ref 0–200)
HDL: 51.9 mg/dL (ref >39.00)
LDL CALC: 45 mg/dL (ref 0–99)
NonHDL: 70.51
Total CHOL/HDL Ratio: 2
Triglycerides: 130 mg/dL (ref 0.0–149.0)
VLDL: 26 mg/dL (ref 0.0–40.0)

## 2018-06-03 LAB — MAGNESIUM: Magnesium: 1.7 mg/dL (ref 1.5–2.5)

## 2018-06-03 NOTE — Assessment & Plan Note (Signed)
Stable Continue current medication F/u for new, worsening symptoms - Magnesium; Future

## 2018-06-03 NOTE — Assessment & Plan Note (Signed)
Stable, on lower end but feels well Continue current medications Continue to monitor - Comprehensive metabolic panel; Future - Lipid panel; Future

## 2018-06-03 NOTE — Assessment & Plan Note (Signed)
dexa up to date Continue evista, add ca and vit d, also discussed importance of weight bearing exercise Will repeat DEXA in 2020

## 2018-06-03 NOTE — Assessment & Plan Note (Signed)
Update labs F/U with further recommendations pending lab results - Comprehensive metabolic panel; Future - CBC; Future - Lipid panel; Future

## 2018-06-03 NOTE — Assessment & Plan Note (Signed)
Stable Continue current medication F/u for new, worsening symptoms

## 2018-06-03 NOTE — Patient Instructions (Addendum)
Head downstairs for lab work.  For bone health, I recommend an adequate intake of dietary calcium (1200 mg/d) and vitamin D (800 IU daily).  Health Maintenance, Female Adopting a healthy lifestyle and getting preventive care can go a long way to promote health and wellness. Talk with your health care provider about what schedule of regular examinations is right for you. This is a good chance for you to check in with your provider about disease prevention and staying healthy. In between checkups, there are plenty of things you can do on your own. Experts have done a lot of research about which lifestyle changes and preventive measures are most likely to keep you healthy. Ask your health care provider for more information. Weight and diet Eat a healthy diet  Be sure to include plenty of vegetables, fruits, low-fat dairy products, and lean protein.  Do not eat a lot of foods high in solid fats, added sugars, or salt.  Get regular exercise. This is one of the most important things you can do for your health. ? Most adults should exercise for at least 150 minutes each week. The exercise should increase your heart rate and make you sweat (moderate-intensity exercise). ? Most adults should also do strengthening exercises at least twice a week. This is in addition to the moderate-intensity exercise.  Maintain a healthy weight  Body mass index (BMI) is a measurement that can be used to identify possible weight problems. It estimates body fat based on height and weight. Your health care provider can help determine your BMI and help you achieve or maintain a healthy weight.  For females 34 years of age and older: ? A BMI below 18.5 is considered underweight. ? A BMI of 18.5 to 24.9 is normal. ? A BMI of 25 to 29.9 is considered overweight. ? A BMI of 30 and above is considered obese.  Watch levels of cholesterol and blood lipids  You should start having your blood tested for lipids and cholesterol  at 76 years of age, then have this test every 5 years.  You may need to have your cholesterol levels checked more often if: ? Your lipid or cholesterol levels are high. ? You are older than 76 years of age. ? You are at high risk for heart disease.  Cancer screening Lung Cancer  Lung cancer screening is recommended for adults 4-84 years old who are at high risk for lung cancer because of a history of smoking.  A yearly low-dose CT scan of the lungs is recommended for people who: ? Currently smoke. ? Have quit within the past 15 years. ? Have at least a 30-pack-year history of smoking. A pack year is smoking an average of one pack of cigarettes a day for 1 year.  Yearly screening should continue until it has been 15 years since you quit.  Yearly screening should stop if you develop a health problem that would prevent you from having lung cancer treatment.  Breast Cancer  Practice breast self-awareness. This means understanding how your breasts normally appear and feel.  It also means doing regular breast self-exams. Let your health care provider know about any changes, no matter how small.  If you are in your 20s or 30s, you should have a clinical breast exam (CBE) by a health care provider every 1-3 years as part of a regular health exam.  If you are 51 or older, have a CBE every year. Also consider having a breast X-ray (mammogram) every year.  If you have a family history of breast cancer, talk to your health care provider about genetic screening.  If you are at high risk for breast cancer, talk to your health care provider about having an MRI and a mammogram every year.  Breast cancer gene (BRCA) assessment is recommended for women who have family members with BRCA-related cancers. BRCA-related cancers include: ? Breast. ? Ovarian. ? Tubal. ? Peritoneal cancers.  Results of the assessment will determine the need for genetic counseling and BRCA1 and BRCA2  testing.  Cervical Cancer Your health care provider may recommend that you be screened regularly for cancer of the pelvic organs (ovaries, uterus, and vagina). This screening involves a pelvic examination, including checking for microscopic changes to the surface of your cervix (Pap test). You may be encouraged to have this screening done every 3 years, beginning at age 21.  For women ages 30-65, health care providers may recommend pelvic exams and Pap testing every 3 years, or they may recommend the Pap and pelvic exam, combined with testing for human papilloma virus (HPV), every 5 years. Some types of HPV increase your risk of cervical cancer. Testing for HPV may also be done on women of any age with unclear Pap test results.  Other health care providers may not recommend any screening for nonpregnant women who are considered low risk for pelvic cancer and who do not have symptoms. Ask your health care provider if a screening pelvic exam is right for you.  If you have had past treatment for cervical cancer or a condition that could lead to cancer, you need Pap tests and screening for cancer for at least 20 years after your treatment. If Pap tests have been discontinued, your risk factors (such as having a new sexual partner) need to be reassessed to determine if screening should resume. Some women have medical problems that increase the chance of getting cervical cancer. In these cases, your health care provider may recommend more frequent screening and Pap tests.  Colorectal Cancer  This type of cancer can be detected and often prevented.  Routine colorectal cancer screening usually begins at 76 years of age and continues through 75 years of age.  Your health care provider may recommend screening at an earlier age if you have risk factors for colon cancer.  Your health care provider may also recommend using home test kits to check for hidden blood in the stool.  A small camera at the end of a  tube can be used to examine your colon directly (sigmoidoscopy or colonoscopy). This is done to check for the earliest forms of colorectal cancer.  Routine screening usually begins at age 50.  Direct examination of the colon should be repeated every 5-10 years through 75 years of age. However, you may need to be screened more often if early forms of precancerous polyps or small growths are found.  Skin Cancer  Check your skin from head to toe regularly.  Tell your health care provider about any new moles or changes in moles, especially if there is a change in a mole's shape or color.  Also tell your health care provider if you have a mole that is larger than the size of a pencil eraser.  Always use sunscreen. Apply sunscreen liberally and repeatedly throughout the day.  Protect yourself by wearing long sleeves, pants, a wide-brimmed hat, and sunglasses whenever you are outside.  Heart disease, diabetes, and high blood pressure  High blood pressure causes heart disease and   increases the risk of stroke. High blood pressure is more likely to develop in: ? People who have blood pressure in the high end of the normal range (130-139/85-89 mm Hg). ? People who are overweight or obese. ? People who are African American.  If you are 18-39 years of age, have your blood pressure checked every 3-5 years. If you are 40 years of age or older, have your blood pressure checked every year. You should have your blood pressure measured twice-once when you are at a hospital or clinic, and once when you are not at a hospital or clinic. Record the average of the two measurements. To check your blood pressure when you are not at a hospital or clinic, you can use: ? An automated blood pressure machine at a pharmacy. ? A home blood pressure monitor.  If you are between 55 years and 79 years old, ask your health care provider if you should take aspirin to prevent strokes.  Have regular diabetes screenings. This  involves taking a blood sample to check your fasting blood sugar level. ? If you are at a normal weight and have a low risk for diabetes, have this test once every three years after 76 years of age. ? If you are overweight and have a high risk for diabetes, consider being tested at a younger age or more often. Preventing infection Hepatitis B  If you have a higher risk for hepatitis B, you should be screened for this virus. You are considered at high risk for hepatitis B if: ? You were born in a country where hepatitis B is common. Ask your health care provider which countries are considered high risk. ? Your parents were born in a high-risk country, and you have not been immunized against hepatitis B (hepatitis B vaccine). ? You have HIV or AIDS. ? You use needles to inject street drugs. ? You live with someone who has hepatitis B. ? You have had sex with someone who has hepatitis B. ? You get hemodialysis treatment. ? You take certain medicines for conditions, including cancer, organ transplantation, and autoimmune conditions.  Hepatitis C  Blood testing is recommended for: ? Everyone born from 1945 through 1965. ? Anyone with known risk factors for hepatitis C.  Sexually transmitted infections (STIs)  You should be screened for sexually transmitted infections (STIs) including gonorrhea and chlamydia if: ? You are sexually active and are younger than 76 years of age. ? You are older than 76 years of age and your health care provider tells you that you are at risk for this type of infection. ? Your sexual activity has changed since you were last screened and you are at an increased risk for chlamydia or gonorrhea. Ask your health care provider if you are at risk.  If you do not have HIV, but are at risk, it may be recommended that you take a prescription medicine daily to prevent HIV infection. This is called pre-exposure prophylaxis (PrEP). You are considered at risk if: ? You are  sexually active and do not regularly use condoms or know the HIV status of your partner(s). ? You take drugs by injection. ? You are sexually active with a partner who has HIV.  Talk with your health care provider about whether you are at high risk of being infected with HIV. If you choose to begin PrEP, you should first be tested for HIV. You should then be tested every 3 months for as long as you are taking   PrEP. Pregnancy  If you are premenopausal and you may become pregnant, ask your health care provider about preconception counseling.  If you may become pregnant, take 400 to 800 micrograms (mcg) of folic acid every day.  If you want to prevent pregnancy, talk to your health care provider about birth control (contraception). Osteoporosis and menopause  Osteoporosis is a disease in which the bones lose minerals and strength with aging. This can result in serious bone fractures. Your risk for osteoporosis can be identified using a bone density scan.  If you are 104 years of age or older, or if you are at risk for osteoporosis and fractures, ask your health care provider if you should be screened.  Ask your health care provider whether you should take a calcium or vitamin D supplement to lower your risk for osteoporosis.  Menopause may have certain physical symptoms and risks.  Hormone replacement therapy may reduce some of these symptoms and risks. Talk to your health care provider about whether hormone replacement therapy is right for you. Follow these instructions at home:  Schedule regular health, dental, and eye exams.  Stay current with your immunizations.  Do not use any tobacco products including cigarettes, chewing tobacco, or electronic cigarettes.  If you are pregnant, do not drink alcohol.  If you are breastfeeding, limit how much and how often you drink alcohol.  Limit alcohol intake to no more than 1 drink per day for nonpregnant women. One drink equals 12 ounces of  beer, 5 ounces of wine, or 1 ounces of hard liquor.  Do not use street drugs.  Do not share needles.  Ask your health care provider for help if you need support or information about quitting drugs.  Tell your health care provider if you often feel depressed.  Tell your health care provider if you have ever been abused or do not feel safe at home. This information is not intended to replace advice given to you by your health care provider. Make sure you discuss any questions you have with your health care provider. Document Released: 01/27/2011 Document Revised: 12/20/2015 Document Reviewed: 04/17/2015 Elsevier Interactive Patient Education  [employer].

## 2018-06-03 NOTE — Assessment & Plan Note (Signed)
Reviewed annual screening exams, healthy lifestyle,  additional information provided on AVS - Comprehensive metabolic panel; Future - CBC; Future - Lipid panel; Future - Magnesium; Future  Need for influenza vaccination- Flu vaccine HIGH DOSE PF

## 2018-06-03 NOTE — Progress Notes (Signed)
Courtney Brown is a 77 y.o. female with the following history as recorded in EpicCare:  Patient Active Problem List   Diagnosis Date Noted  . Chronic respiratory failure with hypercapnia ([place]) 01/19/2018  . Chronic bilateral low back pain with right-sided sciatica 09/01/2017  . Colon polyps 08/08/2016  . PVD (peripheral vascular disease) ([place]) 08/08/2016  . COPD GOLD II 07/29/2016  . HTN (hypertension), benign 07/29/2016  . Hyperlipidemia 07/29/2016  . CAD (coronary artery disease) 07/29/2016  . Post herpetic neuralgia 07/29/2016  . Adjustment disorder with mixed anxiety and depressed mood 07/29/2016  . Osteoporosis 07/29/2016  . Gastroesophageal reflux disease without esophagitis 07/29/2016  . Hearing loss 07/29/2016    Current Outpatient Medications  Medication Sig Dispense Refill  . acetaminophen (TYLENOL) 500 MG tablet Take 500 mg by mouth every 6 (six) hours as needed.    Marland Kitchen albuterol (PROAIR HFA) 108 (90 Base) MCG/ACT inhaler Inhale 1-2 puffs into the lungs every 6 (six) hours as needed for wheezing or shortness of breath. 1 Inhaler 5  . ALPRAZolam (XANAX) 0.25 MG tablet TAKE 1 TABLET BY MOUTH AT BEDTIME AS NEEDED FOR ANXIETY 30 tablet 0  . amLODipine (NORVASC) 10 MG tablet TAKE 1 TABLET BY MOUTH EVERYDAY AT BEDTIME 90 tablet 0  . aspirin 81 MG tablet Take 81 mg by mouth daily.    . citalopram (CELEXA) 20 MG tablet TAKE 1 TABLET BY MOUTH EVERY DAY 90 tablet 1  . colesevelam (WELCHOL) 625 MG tablet TAKE 1 TABLET BY MOUTH 2 TIMES DAILY WITH A MEAL. 60 tablet 1  . fenofibrate (TRICOR) 145 MG tablet TAKE 1 TABLET BY MOUTH EVERY DAY 90 tablet 1  . furosemide (LASIX) 20 MG tablet TAKE 1 TABLET BY MOUTH DAILY. FOR LEG EDEMA 90 tablet 0  . gabapentin (NEURONTIN) 100 MG capsule Take 1 capsule (100 mg total) by mouth 3 (three) times daily. Must keep 05/2018 appt for future refills. 270 capsule 0  . Garlic Oil 9629 MG CAPS Take 1,000 mg by mouth 1 day or 1 dose.    . Lactobacillus (PROBIOTIC  ACIDOPHILUS) CAPS Take by mouth once.    . loratadine (CLARITIN) 10 MG tablet Take 1 tablet (10 mg total) by mouth daily. 90 tablet 1  . lovastatin (MEVACOR) 40 MG tablet TAKE 1 TABLET BY MOUTH EVERYDAY AT BEDTIME 90 tablet 0  . magnesium oxide (MAG-OX) 400 MG tablet TAKE 1 TABLET BY MOUTH TWICE A DAY 60 tablet 0  . metoprolol succinate (TOPROL-XL) 50 MG 24 hr tablet Take 1 tablet (50 mg total) by mouth 2 (two) times daily. 180 tablet 1  . mometasone (NASONEX) 50 MCG/ACT nasal spray PLACE 2 SPRAYS INTO THE NOSE DAILY. 17 g 5  . nystatin ointment (MYCOSTATIN) Apply 1 application topically 2 (two) times daily. 30 g 0  . omeprazole (PRILOSEC) 20 MG capsule TAKE 1 CAPSULE BY MOUTH EVERY DAY 90 capsule 1  . raloxifene (EVISTA) 60 MG tablet TAKE 1 TABLET BY MOUTH EVERY DAY 90 tablet 1  . SPIRIVA RESPIMAT 2.5 MCG/ACT AERS Inhale 2 puffs into the lungs daily. 1 Inhaler 11  . SYMBICORT 160-4.5 MCG/ACT inhaler Inhale 2 puffs into the lungs 2 (two) times daily. 1 Inhaler 2  . triamcinolone cream (KENALOG) 0.1 % Apply 1 application topically 2 (two) times daily. 30 g 0   No current facility-administered medications for this visit.     Allergies: Prednisone  Past Medical History:  Diagnosis Date  . Allergy   . Anxiety   .  Colon polyps   . COPD (chronic obstructive pulmonary disease) ([place])   . Emphysema of lung ([place])   . GERD (gastroesophageal reflux disease)   . H/O hiatal hernia   . Hyperlipidemia   . Hypertension   . Inverted nipple    bilateral, pt says that's normal  . Myocardial infarct, old 2008   no stent placed  . Osteoporosis   . Stroke [facility])    TIA    Past Surgical History:  Procedure Laterality Date  . ABDOMINAL HYSTERECTOMY  1998  . 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 HERNIA REPAIR     umbilical, ventral  . INSERTION OF ILIAC STENT  2008    Family History  Problem Relation Age of Onset  . Hearing loss Father   . Hearing loss Maternal Aunt   . Hearing  loss Maternal Grandmother   . Cancer Maternal Grandmother        colon  . Arthritis Mother   . Cancer Paternal Grandmother        colon  . Breast cancer Neg Hx     Social History   Tobacco Use  . Smoking status: Former Smoker    Packs/day: 1.50    Years: 44.00    Pack years: 66.00    Types: Cigarettes    Last attempt to quit: 07/28/2005    Years since quitting: 12.8  . Smokeless tobacco: Never Used  Substance Use Topics  . Alcohol use: Yes    Alcohol/week: 2.0 standard drinks    Types: 2 Shots of liquor per week    Comment: social, weekly     Subjective:  [name] is here today for annual medical exam  CPE-  Last dental exam: no, dentures Last vision exam: annually Mammogram- up to date Lung ca screening: quit smoking 12 years ago - declines referral for lung Ca screening Colonoscopy: up to date Lipids: lipid panel ordered today A1c: N/A Vaccinations: flu vacc today Diet and exercise: trying to watch diet, portions, picking healthy foods, no regular exercise   No LMP recorded. Patient has had a hysterectomy.  Hypertension -maintained on metoprolol succinate 50 BID, amlodipine 10, lasix 20 daily Reports daily medication compliance.  BP Readings from Last 3 Encounters:  06/03/18 (!) 110/52  03/03/18 102/60  01/18/18 114/60   Anxiety- xanax 0.25 qhs, celexa 20 daily, reports daily medication compliance with adequate control of mood and no noted adverse effects  GERD- prilosec 20, does experience breakthrough GERD symptoms if she misses a dose, but no symptoms if taking daily.  osteoporosis - maintained on raloxifene 60 daily, was not aware she should also be taking Ca, d supplements, last DEXA 2018 and prolia was recommended due to continued osteoporosis, however she declined and preferred to stay on raloxifene, says she will consider additional changes at next DEXA if needed  HLD- maintained on lovastatin 40, welchol 625, fenofibrate 145, says actually welchol was  started by GI provider many years ago for GI disturbance and has just remained on welchol since Reports daily medication compliance without adverse medication effects.  Lab Results  Component Value Date   CHOL 156 05/05/2017   HDL 53.80 05/05/2017   LDLCALC 70 05/05/2017   TRIG 161.0 (H) 05/05/2017   CHOLHDL 3 05/05/2017     Review of Systems  Constitutional: Negative for chills and fever.  HENT: Positive for hearing loss.   Eyes: Negative for blurred vision and double vision.  Respiratory: Negative for cough and  shortness of breath.   Cardiovascular: Negative for chest pain and palpitations.  Gastrointestinal: Negative for abdominal pain, constipation, diarrhea, heartburn, nausea and vomiting.  Genitourinary: Negative for dysuria and hematuria.  Musculoskeletal: Negative for falls.  Skin: Negative for rash.  Neurological: Negative for speech change, loss of consciousness and headaches.  Endo/Heme/Allergies: Does not bruise/bleed easily.  Psychiatric/Behavioral: Negative for depression. The patient is not nervous/anxious.    Hearing loss- reports full hearing loss in right ear for some time, now noticing decreasing hearing from left ear over past few months or longer, no ear pain,drainage, or injuries, does not listen to loud music, no exposure to loud noises  Objective:  Vitals:   06/03/18 1046  BP: (!) 110/52  Pulse: 62  SpO2: 95%  Weight: 163 lb (73.9 kg)  Height: 5' (1.524 m)    General: Well developed, well nourished, in no acute distress  Skin : Warm and dry.  Head: Normocephalic and atraumatic  Eyes: Sclera and conjunctiva clear; pupils round and reactive to light; extraocular movements intact  Ears: External normal; canals clear; tympanic membranes cloudy Oropharynx: Pink, supple. No suspicious lesions  Neck: Supple without thyromegaly, adenopathy  Lungs: Respirations unlabored; clear to auscultation bilaterally  CVS exam: normal rate, regular rhythm, normal S1,  S2 Abdomen: Soft; nontender; nondistended;  no masses  Musculoskeletal: No deformities; no active joint inflammation  Extremities: No edema, cyanosis Vessels: Symmetric bilaterally  Neurologic: Alert and oriented; speech intact; face symmetrical; moves all extremities well; CNII-XII intact without focal deficit  Psychiatric: Normal mood and affect.   Assessment:  1. Encounter for general adult medical examination with abnormal findings   2. Need for influenza vaccination   3. HTN (hypertension), benign   4. Mixed hyperlipidemia   5. Age related osteoporosis, unspecified pathological fracture presence   6. Gastroesophageal reflux disease without esophagitis   7. Adjustment disorder with mixed anxiety and depressed mood     Plan:   Return in about 6 months (around 12/02/2018) for routine follow up of chronic conditions.  Orders Placed This Encounter  Procedures  . Flu vaccine HIGH DOSE PF  . Comprehensive metabolic panel    Standing Status:   Future    Number of Occurrences:   1    Standing Expiration Date:   06/04/2019  . CBC    Standing Status:   Future    Number of Occurrences:   1    Standing Expiration Date:   06/04/2019  . Lipid panel    Standing Status:   Future    Number of Occurrences:   1    Standing Expiration Date:   06/04/2019  . Magnesium    Standing Status:   Future    Number of Occurrences:   1    Standing Expiration Date:   08/03/2018    Requested Prescriptions    No prescriptions requested or ordered in this encounter    Decreased hearing, unspecified laterality Discussed referral to ENT for further evaluation,she declines She says she will F/U if symptoms worsen

## 2018-06-22 ENCOUNTER — Other Ambulatory Visit: Payer: Self-pay | Admitting: Family

## 2018-06-23 MED ORDER — MAGNESIUM OXIDE 400 MG PO TABS: ORAL_TABLET | ORAL | 3 refills | Status: DC

## 2018-06-28 ENCOUNTER — Other Ambulatory Visit: Payer: Self-pay | Admitting: Nurse Practitioner

## 2018-06-28 DIAGNOSIS — I739 Peripheral vascular disease, unspecified: Secondary | ICD-10-CM

## 2018-06-28 DIAGNOSIS — F4323 Adjustment disorder with mixed anxiety and depressed mood: Secondary | ICD-10-CM

## 2018-06-28 MED ORDER — FUROSEMIDE 20 MG PO TABS: ORAL_TABLET | ORAL | 0 refills | Status: DC

## 2018-06-28 NOTE — Telephone Encounter (Signed)
[place] Controlled Database Checked Last filled: 06/02/18 # 30 LOV w/you: 06/03/18 Next appt w/you: 12/06/18

## 2018-06-30 MED ORDER — ALPRAZOLAM 0.25 MG PO TABS: ORAL_TABLET | ORAL | 0 refills | Status: DC

## 2018-07-03 ENCOUNTER — Encounter: Payer: Self-pay | Admitting: Nurse Practitioner

## 2018-07-05 ENCOUNTER — Other Ambulatory Visit: Payer: Self-pay | Admitting: *Deleted

## 2018-07-05 DIAGNOSIS — F4323 Adjustment disorder with mixed anxiety and depressed mood: Secondary | ICD-10-CM

## 2018-07-05 MED ORDER — ALPRAZOLAM 0.25 MG PO TABS: ORAL_TABLET | ORAL | 0 refills | Status: DC

## 2018-07-05 NOTE — Telephone Encounter (Signed)
Patient requesting Alprazolam be sent to [facility] since [facility] have none available, except 1mg  and 2 mg tab. Please advise.

## 2018-07-06 ENCOUNTER — Encounter: Payer: Self-pay | Admitting: Nurse Practitioner

## 2018-08-03 ENCOUNTER — Other Ambulatory Visit: Payer: Self-pay | Admitting: Nurse Practitioner

## 2018-08-03 DIAGNOSIS — F4323 Adjustment disorder with mixed anxiety and depressed mood: Secondary | ICD-10-CM

## 2018-08-04 MED ORDER — ALPRAZOLAM 0.25 MG PO TABS: ORAL_TABLET | ORAL | 0 refills | Status: DC

## 2018-08-04 NOTE — Telephone Encounter (Signed)
[place] Controlled Database Checked Last filled: 07/05/18 # 30 LOV w/you: 06/03/18 Next appt w/you: 12/06/18

## 2018-08-06 ENCOUNTER — Other Ambulatory Visit: Payer: Self-pay | Admitting: Nurse Practitioner

## 2018-08-06 DIAGNOSIS — B0229 Other postherpetic nervous system involvement: Secondary | ICD-10-CM

## 2018-08-11 ENCOUNTER — Other Ambulatory Visit: Payer: Self-pay | Admitting: Nurse Practitioner

## 2018-08-11 DIAGNOSIS — E782 Mixed hyperlipidemia: Secondary | ICD-10-CM

## 2018-08-11 DIAGNOSIS — I10 Essential (primary) hypertension: Secondary | ICD-10-CM

## 2018-08-11 MED ORDER — AMLODIPINE BESYLATE 10 MG PO TABS: 10.0000 mg | ORAL_TABLET | Freq: Every day | ORAL | 0 refills | Status: DC

## 2018-08-11 MED ORDER — LOVASTATIN 40 MG PO TABS: 40.0000 mg | ORAL_TABLET | Freq: Every day | ORAL | 0 refills | Status: DC

## 2018-08-13 ENCOUNTER — Other Ambulatory Visit: Payer: Self-pay | Admitting: Nurse Practitioner

## 2018-08-13 DIAGNOSIS — J449 Chronic obstructive pulmonary disease, unspecified: Secondary | ICD-10-CM

## 2018-08-13 MED ORDER — SYMBICORT 160-4.5 MCG/ACT IN AERO: 2.0000 | INHALATION_SPRAY | Freq: Two times a day (BID) | RESPIRATORY_TRACT | 2 refills | Status: DC

## 2018-08-13 NOTE — Telephone Encounter (Signed)
Copied from [place] [id number]. Topic: Quick Communication - Rx Refill/Question >> Aug 13, 2018  1:22 PM Blase Mess A wrote: Medication: SYMBICORT 160-4.5 MCG/ACT inhaler [[id number]]   Has the patient contacted their pharmacy? Yes  (Agent: If no, request that the patient contact the pharmacy for the refill.) (Agent: If yes, when and what did the pharmacy advise?)  Preferred Pharmacy (with phone number or street name): [id number] [address], [place]. [phone] (Phone) [phone] (Fax)    Agent: Please be advised that RX refills may take up to 3 business days. We ask that you follow-up with your pharmacy.

## 2018-08-23 ENCOUNTER — Other Ambulatory Visit: Payer: Self-pay | Admitting: Nurse Practitioner

## 2018-08-23 DIAGNOSIS — F4323 Adjustment disorder with mixed anxiety and depressed mood: Secondary | ICD-10-CM

## 2018-08-30 ENCOUNTER — Other Ambulatory Visit: Payer: Self-pay | Admitting: Nurse Practitioner

## 2018-08-30 DIAGNOSIS — E782 Mixed hyperlipidemia: Secondary | ICD-10-CM

## 2018-08-31 ENCOUNTER — Other Ambulatory Visit: Payer: Self-pay | Admitting: Nurse Practitioner

## 2018-08-31 DIAGNOSIS — F4323 Adjustment disorder with mixed anxiety and depressed mood: Secondary | ICD-10-CM

## 2018-09-01 ENCOUNTER — Other Ambulatory Visit: Payer: Self-pay | Admitting: Nurse Practitioner

## 2018-09-01 DIAGNOSIS — F4323 Adjustment disorder with mixed anxiety and depressed mood: Secondary | ICD-10-CM

## 2018-09-01 NOTE — Telephone Encounter (Signed)
[place] Controlled Database Checked Last filled: 08/04/18 # 30 LOV w/you: 06/03/18 Next appt w/you: 12/06/18

## 2018-09-02 MED ORDER — ALPRAZOLAM 0.25 MG PO TABS: ORAL_TABLET | ORAL | 0 refills | Status: DC

## 2018-09-23 ENCOUNTER — Other Ambulatory Visit: Payer: Self-pay | Admitting: Nurse Practitioner

## 2018-09-23 DIAGNOSIS — E782 Mixed hyperlipidemia: Secondary | ICD-10-CM

## 2018-09-27 ENCOUNTER — Other Ambulatory Visit: Payer: Self-pay | Admitting: Nurse Practitioner

## 2018-09-27 DIAGNOSIS — I739 Peripheral vascular disease, unspecified: Secondary | ICD-10-CM

## 2018-09-27 DIAGNOSIS — F4323 Adjustment disorder with mixed anxiety and depressed mood: Secondary | ICD-10-CM

## 2018-09-27 MED ORDER — ALPRAZOLAM 0.25 MG PO TABS: ORAL_TABLET | ORAL | 0 refills | Status: DC

## 2018-09-27 MED ORDER — FUROSEMIDE 20 MG PO TABS: ORAL_TABLET | ORAL | 0 refills | Status: DC

## 2018-09-27 NOTE — Telephone Encounter (Signed)
[place] Controlled Database Checked Last filled: 09/02/18 #30 LOV w/you: 06/03/18 Next appt w/you: 12/06/18

## 2018-10-06 ENCOUNTER — Other Ambulatory Visit: Payer: Self-pay | Admitting: Nurse Practitioner

## 2018-10-06 DIAGNOSIS — J449 Chronic obstructive pulmonary disease, unspecified: Secondary | ICD-10-CM

## 2018-10-22 ENCOUNTER — Other Ambulatory Visit: Payer: Self-pay | Admitting: *Deleted

## 2018-10-22 DIAGNOSIS — J449 Chronic obstructive pulmonary disease, unspecified: Secondary | ICD-10-CM

## 2018-10-22 MED ORDER — MOMETASONE FUROATE 50 MCG/ACT NA SUSP: 2.0000 | Freq: Every day | NASAL | 2 refills | Status: DC

## 2018-10-22 MED ORDER — MAGNESIUM OXIDE 400 MG PO TABS: ORAL_TABLET | ORAL | 2 refills | Status: DC

## 2018-11-03 ENCOUNTER — Other Ambulatory Visit: Payer: Self-pay | Admitting: *Deleted

## 2018-11-03 DIAGNOSIS — I10 Essential (primary) hypertension: Secondary | ICD-10-CM

## 2018-11-03 DIAGNOSIS — B0229 Other postherpetic nervous system involvement: Secondary | ICD-10-CM

## 2018-11-03 DIAGNOSIS — E782 Mixed hyperlipidemia: Secondary | ICD-10-CM

## 2018-11-03 MED ORDER — GABAPENTIN 100 MG PO CAPS: ORAL_CAPSULE | ORAL | 0 refills | Status: DC

## 2018-11-03 MED ORDER — AMLODIPINE BESYLATE 10 MG PO TABS: 10.0000 mg | ORAL_TABLET | Freq: Every day | ORAL | 0 refills | Status: DC

## 2018-11-03 MED ORDER — LOVASTATIN 40 MG PO TABS: 40.0000 mg | ORAL_TABLET | Freq: Every day | ORAL | 0 refills | Status: DC

## 2018-11-03 NOTE — Addendum Note (Signed)
Addended by: Cresenciano Lick on: 11/03/2018 10:21 AM   Modules accepted: Orders

## 2018-11-15 ENCOUNTER — Other Ambulatory Visit: Payer: Self-pay | Admitting: *Deleted

## 2018-11-15 DIAGNOSIS — M810 Age-related osteoporosis without current pathological fracture: Secondary | ICD-10-CM

## 2018-11-15 DIAGNOSIS — K219 Gastro-esophageal reflux disease without esophagitis: Secondary | ICD-10-CM

## 2018-11-15 DIAGNOSIS — E782 Mixed hyperlipidemia: Secondary | ICD-10-CM

## 2018-11-15 MED ORDER — RALOXIFENE HCL 60 MG PO TABS: 60.0000 mg | ORAL_TABLET | Freq: Every day | ORAL | 0 refills | Status: DC

## 2018-11-15 MED ORDER — OMEPRAZOLE 20 MG PO CPDR: DELAYED_RELEASE_CAPSULE | ORAL | 0 refills | Status: DC

## 2018-11-15 MED ORDER — FENOFIBRATE 145 MG PO TABS: 145.0000 mg | ORAL_TABLET | Freq: Every day | ORAL | 0 refills | Status: DC

## 2018-11-15 MED ORDER — LORATADINE 10 MG PO TABS: 10.0000 mg | ORAL_TABLET | Freq: Every day | ORAL | 0 refills | Status: DC

## 2018-11-15 NOTE — Addendum Note (Signed)
Addended by: Cresenciano Lick on: 11/15/2018 03:08 PM   Modules accepted: Orders

## 2018-11-16 ENCOUNTER — Other Ambulatory Visit: Payer: Self-pay | Admitting: *Deleted

## 2018-11-16 DIAGNOSIS — E782 Mixed hyperlipidemia: Secondary | ICD-10-CM

## 2018-11-16 MED ORDER — COLESEVELAM HCL 625 MG PO TABS: ORAL_TABLET | ORAL | 1 refills | Status: DC

## 2018-12-02 ENCOUNTER — Other Ambulatory Visit: Payer: Self-pay | Admitting: *Deleted

## 2018-12-02 DIAGNOSIS — F4323 Adjustment disorder with mixed anxiety and depressed mood: Secondary | ICD-10-CM

## 2018-12-02 MED ORDER — ALPRAZOLAM 0.25 MG PO TABS: ORAL_TABLET | ORAL | 0 refills | Status: DC

## 2018-12-02 NOTE — Telephone Encounter (Signed)
[place] Controlled Database Checked Last filled: 11/03/18 # 30 LOV w/ [place]: 06/03/18 Next appt: None

## 2018-12-02 NOTE — Telephone Encounter (Signed)
Done erx  Needs OV for further refills 

## 2018-12-06 ENCOUNTER — Ambulatory Visit: Payer: Medicare Other | Admitting: Nurse Practitioner

## 2018-12-10 ENCOUNTER — Other Ambulatory Visit: Payer: Self-pay | Admitting: Internal Medicine

## 2018-12-10 DIAGNOSIS — E782 Mixed hyperlipidemia: Secondary | ICD-10-CM

## 2018-12-31 ENCOUNTER — Ambulatory Visit (INDEPENDENT_AMBULATORY_CARE_PROVIDER_SITE_OTHER): Payer: Medicare Other | Admitting: Family

## 2018-12-31 ENCOUNTER — Encounter: Payer: Self-pay | Admitting: Family

## 2018-12-31 ENCOUNTER — Other Ambulatory Visit: Payer: Self-pay

## 2018-12-31 VITALS — BP 108/62 | HR 60 | Temp 98.1°F | Ht 60.0 in | Wt 160.2 lb

## 2018-12-31 DIAGNOSIS — F4323 Adjustment disorder with mixed anxiety and depressed mood: Secondary | ICD-10-CM | POA: Diagnosis not present

## 2018-12-31 DIAGNOSIS — K469 Unspecified abdominal hernia without obstruction or gangrene: Secondary | ICD-10-CM

## 2018-12-31 DIAGNOSIS — J449 Chronic obstructive pulmonary disease, unspecified: Secondary | ICD-10-CM | POA: Diagnosis not present

## 2018-12-31 DIAGNOSIS — I739 Peripheral vascular disease, unspecified: Secondary | ICD-10-CM

## 2018-12-31 DIAGNOSIS — K219 Gastro-esophageal reflux disease without esophagitis: Secondary | ICD-10-CM

## 2018-12-31 DIAGNOSIS — I10 Essential (primary) hypertension: Secondary | ICD-10-CM

## 2018-12-31 DIAGNOSIS — E782 Mixed hyperlipidemia: Secondary | ICD-10-CM

## 2018-12-31 MED ORDER — ALPRAZOLAM 0.25 MG PO TABS: ORAL_TABLET | ORAL | 1 refills | Status: DC

## 2018-12-31 MED ORDER — SPIRIVA RESPIMAT 2.5 MCG/ACT IN AERS: 2.0000 | INHALATION_SPRAY | Freq: Every day | RESPIRATORY_TRACT | 3 refills | Status: DC

## 2018-12-31 MED ORDER — METOPROLOL SUCCINATE ER 50 MG PO TB24: 50.0000 mg | ORAL_TABLET | Freq: Two times a day (BID) | ORAL | 1 refills | Status: DC

## 2018-12-31 MED ORDER — AMLODIPINE BESYLATE 10 MG PO TABS: 10.0000 mg | ORAL_TABLET | Freq: Every day | ORAL | 0 refills | Status: DC

## 2018-12-31 MED ORDER — FUROSEMIDE 20 MG PO TABS: ORAL_TABLET | ORAL | 1 refills | Status: DC

## 2018-12-31 NOTE — Progress Notes (Signed)
Courtney Brown is a 78 y.o. female with the following history as recorded in EpicCare:  Patient Active Problem List   Diagnosis Date Noted  . Encounter for general adult medical examination with abnormal findings 06/03/2018  . Chronic respiratory failure with hypercapnia ([place]) 01/19/2018  . Chronic bilateral low back pain with right-sided sciatica 09/01/2017  . Colon polyps 08/08/2016  . PVD (peripheral vascular disease) ([place]) 08/08/2016  . COPD GOLD II 07/29/2016  . HTN (hypertension), benign 07/29/2016  . Hyperlipidemia 07/29/2016  . CAD (coronary artery disease) 07/29/2016  . Post herpetic neuralgia 07/29/2016  . Adjustment disorder with mixed anxiety and depressed mood 07/29/2016  . Osteoporosis 07/29/2016  . Gastroesophageal reflux disease without esophagitis 07/29/2016  . Hearing loss 07/29/2016    Current Outpatient Medications  Medication Sig Dispense Refill  . acetaminophen (TYLENOL) 500 MG tablet Take 500 mg by mouth every 6 (six) hours as needed.    Marland Kitchen albuterol (PROAIR HFA) 108 (90 Base) MCG/ACT inhaler Inhale 1-2 puffs into the lungs every 6 (six) hours as needed for wheezing or shortness of breath. 1 Inhaler 5  . ALPRAZolam (XANAX) 0.25 MG tablet TAKE 1 TABLET BY MOUTH AT BEDTIME AS NEEDED FOR ANXIETY 30 tablet 1  . amLODipine (NORVASC) 10 MG tablet Take 1 tablet (10 mg total) by mouth at bedtime. 90 tablet 0  . aspirin 81 MG tablet Take 81 mg by mouth daily.    . Calcium Citrate-Vitamin D (CITRACAL + D PO) Take by mouth. Take twice a day as directed    . citalopram (CELEXA) 20 MG tablet TAKE 1 TABLET BY MOUTH EVERY DAY 90 tablet 1  . colesevelam (WELCHOL) 625 MG tablet TAKE 1 TABLET BY MOUTH 2 TIMES DAILY WITH A MEAL 60 tablet 3  . fenofibrate (TRICOR) 145 MG tablet Take 1 tablet (145 mg total) by mouth daily. 90 tablet 0  . furosemide (LASIX) 20 MG tablet TAKE 1 TABLET BY MOUTH DAILY. FOR LEG EDEMA 90 tablet 1  . gabapentin (NEURONTIN) 100 MG capsule TAKE 1 CAPSULE BY  MOUTH 3 TIMES DAILY. 270 capsule 0  . Garlic Oil 1517 MG CAPS Take 1,000 mg by mouth 1 day or 1 dose.    . Lactobacillus (PROBIOTIC ACIDOPHILUS) CAPS Take by mouth once.    . loratadine (CLARITIN) 10 MG tablet Take 1 tablet (10 mg total) by mouth daily. 90 tablet 0  . lovastatin (MEVACOR) 40 MG tablet Take 1 tablet (40 mg total) by mouth at bedtime. 90 tablet 0  . magnesium oxide (MAG-OX) 400 MG tablet TAKE 1 TABLET BY MOUTH TWICE A DAY 60 tablet 2  . metoprolol succinate (TOPROL-XL) 50 MG 24 hr tablet Take 1 tablet (50 mg total) by mouth 2 (two) times daily. 180 tablet 1  . mometasone (NASONEX) 50 MCG/ACT nasal spray Place 2 sprays into the nose daily. 17 g 2  . nystatin ointment (MYCOSTATIN) Apply 1 application topically 2 (two) times daily. 30 g 0  . omeprazole (PRILOSEC) 20 MG capsule TAKE 1 CAPSULE BY MOUTH EVERY DAY 90 capsule 0  . raloxifene (EVISTA) 60 MG tablet Take 1 tablet (60 mg total) by mouth daily. 90 tablet 0  . SPIRIVA RESPIMAT 2.5 MCG/ACT AERS Inhale 2 puffs into the lungs daily. 3 Inhaler 3  . SYMBICORT 160-4.5 MCG/ACT inhaler TAKE 2 PUFFS BY MOUTH TWICE A DAY 20.4 Inhaler 1  . triamcinolone cream (KENALOG) 0.1 % Apply 1 application topically 2 (two) times daily. 30 g 0   No current facility-administered  medications for this visit.     Allergies: Prednisone  Past Medical History:  Diagnosis Date  . Allergy   . Anxiety   . Colon polyps   . COPD (chronic obstructive pulmonary disease) ([place])   . Emphysema of lung ([place])   . GERD (gastroesophageal reflux disease)   . H/O hiatal hernia   . Hyperlipidemia   . Hypertension   . Inverted nipple    bilateral, pt says that's normal  . Myocardial infarct, old 2008   no stent placed  . Osteoporosis   . Stroke [facility])    TIA    Past Surgical History:  Procedure Laterality Date  . ABDOMINAL HYSTERECTOMY  1998  . 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 HERNIA REPAIR     umbilical, ventral  . INSERTION OF ILIAC  STENT  2008    Family History  Problem Relation Age of Onset  . Hearing loss Father   . Hearing loss Maternal Aunt   . Hearing loss Maternal Grandmother   . Cancer Maternal Grandmother        colon  . Arthritis Mother   . Cancer Paternal Grandmother        colon  . Breast cancer Neg Hx     Social History   Tobacco Use  . Smoking status: Former Smoker    Packs/day: 1.50    Years: 44.00    Pack years: 66.00    Types: Cigarettes    Last attempt to quit: 07/28/2005    Years since quitting: 13.4  . Smokeless tobacco: Never Used  Substance Use Topics  . 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 Due for CPE/ DEXA later this year; last colonoscopy in 2015- due for repeat in 2020/ she is unsure she wants to purse test;     Objective:  Vitals:   12/31/18 1039  BP: 108/62  Pulse: 60  Temp: 98.1 F (36.7 C)  TempSrc: Oral  SpO2: 97%  Weight: 160 lb 3.2 oz (72.7 kg)  Height: 5' (1.524 m)    General: Well developed, well nourished, in no acute distress  Skin : Warm and dry.  Head: Normocephalic and atraumatic  Eyes: Sclera and conjunctiva clear; pupils round and reactive to light; extraocular movements intact  Lungs: Respirations unlabored; clear to auscultation bilaterally without wheeze, rales, rhonchi  CVS exam: normal rate and regular rhythm.  Abdomen: Soft; nontender; nondistended; normoactive bowel sounds;  protrusion noted left lower abdomen  Musculoskeletal: No deformities; no active joint inflammation  Extremities: No edema, cyanosis, clubbing  Vessels: Symmetric bilaterally  Neurologic: Alert and oriented; speech intact; face symmetrical; moves all extremities well; CNII-XII intact without focal deficit   Assessment:  1. Abdominal hernia without obstruction and without gangrene, recurrence not specified, unspecified hernia type   2. HTN (hypertension), benign   3. PVD (peripheral vascular disease) ([place])  4. Adjustment disorder with mixed anxiety and depressed mood   5. COPD GOLD II   6. Mixed hyperlipidemia   7. Gastroesophageal reflux disease without esophagitis     Plan:  Extensive time spent reviewing medications/ updating medical history; Rx for Abd/ pelvic CT- will most likely need to see general surgeon; Rx for Xanax 0.25 mg qhs prn;   Return in about 6 months (around 07/02/2019) for CPE and DEXA on same day.  Orders Placed This Encounter  Procedures  . CT Abdomen Pelvis W Contrast    Standing Status:   Future    Standing Expiration Date:   04/01/2020    Order Specific Question:   If indicated for the ordered procedure, I authorize the administration of contrast media per Radiology protocol    Answer:   Yes    Order Specific Question:   Preferred imaging location?    Answer:   [id number]    Order Specific Question:   Is Oral Contrast requested for this exam?    Answer:   Yes, Per Radiology protocol    Order Specific Question:   Radiology Contrast Protocol - do NOT remove file path    Answer:   \\[email or web address]    Requested Prescriptions   Signed Prescriptions Disp Refills  . amLODipine (NORVASC) 10 MG tablet 90 tablet 0    Sig: Take 1 tablet (10 mg total) by mouth at bedtime.  . furosemide (LASIX) 20 MG tablet 90 tablet 1    Sig: TAKE 1 TABLET BY MOUTH DAILY. FOR LEG EDEMA  . metoprolol succinate (TOPROL-XL) 50 MG 24 hr tablet 180  tablet 1    Sig: Take 1 tablet (50 mg total) by mouth 2 (two) times daily.  Marland Kitchen SPIRIVA RESPIMAT 2.5 MCG/ACT AERS 3 Inhaler 3    Sig: Inhale 2 puffs into the lungs daily.  Marland Kitchen ALPRAZolam (XANAX) 0.25 MG tablet 30 tablet 1    Sig: TAKE 1 TABLET BY MOUTH AT BEDTIME AS NEEDED FOR ANXIETY

## 2019-01-17 ENCOUNTER — Other Ambulatory Visit: Payer: Self-pay | Admitting: *Deleted

## 2019-01-17 DIAGNOSIS — J449 Chronic obstructive pulmonary disease, unspecified: Secondary | ICD-10-CM

## 2019-01-17 MED ORDER — MOMETASONE FUROATE 50 MCG/ACT NA SUSP: 2.0000 | Freq: Every day | NASAL | 2 refills | Status: DC

## 2019-01-25 ENCOUNTER — Ambulatory Visit
Admission: RE | Admit: 2019-01-25 | Discharge: 2019-01-25 | Disposition: A | Discharge status: Home / Self Care | Payer: Medicare Other | Source: Ambulatory Visit | Attending: Family | Admitting: Family

## 2019-01-25 DIAGNOSIS — K469 Unspecified abdominal hernia without obstruction or gangrene: Secondary | ICD-10-CM

## 2019-01-25 DIAGNOSIS — N281 Cyst of kidney, acquired: Secondary | ICD-10-CM | POA: Diagnosis not present

## 2019-01-25 MED ORDER — IOPAMIDOL (ISOVUE-300) INJECTION 61%
100.0000 mL | Freq: Once | INTRAVENOUS | Status: AC | PRN
  Administered 2019-01-25: 16:00:00 100 mL via INTRAVENOUS

## 2019-01-27 ENCOUNTER — Other Ambulatory Visit: Payer: Self-pay | Admitting: Internal Medicine

## 2019-01-27 ENCOUNTER — Other Ambulatory Visit: Payer: Self-pay | Admitting: Family

## 2019-01-27 DIAGNOSIS — E782 Mixed hyperlipidemia: Secondary | ICD-10-CM

## 2019-01-27 DIAGNOSIS — I10 Essential (primary) hypertension: Secondary | ICD-10-CM

## 2019-01-27 DIAGNOSIS — K439 Ventral hernia without obstruction or gangrene: Secondary | ICD-10-CM

## 2019-01-31 ENCOUNTER — Other Ambulatory Visit: Payer: Self-pay | Admitting: Internal Medicine

## 2019-01-31 DIAGNOSIS — B0229 Other postherpetic nervous system involvement: Secondary | ICD-10-CM

## 2019-02-07 ENCOUNTER — Other Ambulatory Visit: Payer: Self-pay | Admitting: Internal Medicine

## 2019-02-07 DIAGNOSIS — K219 Gastro-esophageal reflux disease without esophagitis: Secondary | ICD-10-CM

## 2019-02-07 DIAGNOSIS — E782 Mixed hyperlipidemia: Secondary | ICD-10-CM

## 2019-02-07 DIAGNOSIS — M810 Age-related osteoporosis without current pathological fracture: Secondary | ICD-10-CM

## 2019-02-10 ENCOUNTER — Other Ambulatory Visit: Payer: Self-pay | Admitting: Internal Medicine

## 2019-02-21 ENCOUNTER — Other Ambulatory Visit: Payer: Self-pay | Admitting: Family

## 2019-02-21 DIAGNOSIS — F4323 Adjustment disorder with mixed anxiety and depressed mood: Secondary | ICD-10-CM

## 2019-02-21 MED ORDER — CITALOPRAM HYDROBROMIDE 20 MG PO TABS: 20.0000 mg | ORAL_TABLET | Freq: Every day | ORAL | 1 refills | Status: DC

## 2019-03-01 ENCOUNTER — Other Ambulatory Visit: Payer: Self-pay | Admitting: Family

## 2019-03-01 DIAGNOSIS — J449 Chronic obstructive pulmonary disease, unspecified: Secondary | ICD-10-CM

## 2019-03-01 MED ORDER — SYMBICORT 160-4.5 MCG/ACT IN AERO: INHALATION_SPRAY | RESPIRATORY_TRACT | 1 refills | Status: DC

## 2019-03-04 ENCOUNTER — Other Ambulatory Visit: Payer: Self-pay | Admitting: Family

## 2019-03-04 ENCOUNTER — Telehealth: Payer: Self-pay | Admitting: Family

## 2019-03-04 DIAGNOSIS — F4323 Adjustment disorder with mixed anxiety and depressed mood: Secondary | ICD-10-CM

## 2019-03-04 MED ORDER — ALPRAZOLAM 0.25 MG PO TABS: ORAL_TABLET | ORAL | 1 refills | Status: DC

## 2019-03-04 NOTE — Telephone Encounter (Signed)
Medication Refill - Medication: ALPRAZolam (XANAX) 0.25 MG tablet   Has the patient contacted their pharmacy? Yes.  Pt states she only has two pills left and that the pharmacy has tried to contact office. Please advise.  (Agent: If no, request that the patient contact the pharmacy for the refill.) (Agent: If yes, when and what did the pharmacy advise?)  Preferred Pharmacy (with phone number or street name):  [id number] [address], [place] [address]  Phone: [phone] Fax: [phone]  Not a 24 hour pharmacy; exact hours not known.     Agent: Please be advised that RX refills may take up to 3 business days. We ask that you follow-up with your pharmacy.

## 2019-03-07 ENCOUNTER — Other Ambulatory Visit: Payer: Self-pay | Admitting: Family

## 2019-03-07 MED ORDER — MAGNESIUM OXIDE 400 MG PO TABS: ORAL_TABLET | ORAL | 3 refills | Status: DC

## 2019-03-08 ENCOUNTER — Other Ambulatory Visit: Payer: Self-pay | Admitting: Internal Medicine

## 2019-03-08 DIAGNOSIS — E782 Mixed hyperlipidemia: Secondary | ICD-10-CM

## 2019-04-12 ENCOUNTER — Other Ambulatory Visit: Payer: Self-pay | Admitting: Family

## 2019-04-12 DIAGNOSIS — J449 Chronic obstructive pulmonary disease, unspecified: Secondary | ICD-10-CM

## 2019-04-23 ENCOUNTER — Other Ambulatory Visit: Payer: Self-pay | Admitting: Internal Medicine

## 2019-04-23 DIAGNOSIS — E782 Mixed hyperlipidemia: Secondary | ICD-10-CM

## 2019-04-23 DIAGNOSIS — I10 Essential (primary) hypertension: Secondary | ICD-10-CM

## 2019-05-03 ENCOUNTER — Other Ambulatory Visit: Payer: Self-pay | Admitting: Family

## 2019-05-03 DIAGNOSIS — F4323 Adjustment disorder with mixed anxiety and depressed mood: Secondary | ICD-10-CM

## 2019-05-05 ENCOUNTER — Other Ambulatory Visit: Payer: Self-pay | Admitting: Family

## 2019-05-06 ENCOUNTER — Other Ambulatory Visit: Payer: Self-pay | Admitting: Family

## 2019-05-06 DIAGNOSIS — B0229 Other postherpetic nervous system involvement: Secondary | ICD-10-CM

## 2019-05-06 MED ORDER — GABAPENTIN 100 MG PO CAPS: ORAL_CAPSULE | ORAL | 2 refills | Status: DC

## 2019-05-07 ENCOUNTER — Other Ambulatory Visit: Payer: Self-pay | Admitting: Internal Medicine

## 2019-05-07 DIAGNOSIS — E782 Mixed hyperlipidemia: Secondary | ICD-10-CM

## 2019-05-07 DIAGNOSIS — M810 Age-related osteoporosis without current pathological fracture: Secondary | ICD-10-CM

## 2019-05-07 DIAGNOSIS — K219 Gastro-esophageal reflux disease without esophagitis: Secondary | ICD-10-CM

## 2019-05-21 ENCOUNTER — Other Ambulatory Visit: Payer: Self-pay | Admitting: Family

## 2019-05-21 DIAGNOSIS — J449 Chronic obstructive pulmonary disease, unspecified: Secondary | ICD-10-CM

## 2019-07-01 ENCOUNTER — Other Ambulatory Visit: Payer: Self-pay | Admitting: *Deleted

## 2019-07-01 DIAGNOSIS — M810 Age-related osteoporosis without current pathological fracture: Secondary | ICD-10-CM

## 2019-07-04 ENCOUNTER — Ambulatory Visit (INDEPENDENT_AMBULATORY_CARE_PROVIDER_SITE_OTHER)
Admission: RE | Admit: 2019-07-04 | Discharge: 2019-07-04 | Disposition: A | Discharge status: Home / Self Care | Payer: Medicare Other | Source: Ambulatory Visit | Attending: Family | Admitting: Family

## 2019-07-04 ENCOUNTER — Other Ambulatory Visit (INDEPENDENT_AMBULATORY_CARE_PROVIDER_SITE_OTHER): Payer: Medicare Other

## 2019-07-04 ENCOUNTER — Ambulatory Visit (INDEPENDENT_AMBULATORY_CARE_PROVIDER_SITE_OTHER): Payer: Medicare Other | Admitting: Family

## 2019-07-04 ENCOUNTER — Other Ambulatory Visit: Payer: Self-pay

## 2019-07-04 ENCOUNTER — Encounter: Payer: Self-pay | Admitting: Family

## 2019-07-04 VITALS — BP 104/62 | HR 74 | Temp 98.0°F | Ht 60.0 in | Wt 160.6 lb

## 2019-07-04 DIAGNOSIS — K219 Gastro-esophageal reflux disease without esophagitis: Secondary | ICD-10-CM

## 2019-07-04 DIAGNOSIS — M810 Age-related osteoporosis without current pathological fracture: Secondary | ICD-10-CM

## 2019-07-04 DIAGNOSIS — I739 Peripheral vascular disease, unspecified: Secondary | ICD-10-CM

## 2019-07-04 DIAGNOSIS — E782 Mixed hyperlipidemia: Secondary | ICD-10-CM

## 2019-07-04 DIAGNOSIS — Z0000 Encounter for general adult medical examination without abnormal findings: Secondary | ICD-10-CM | POA: Diagnosis not present

## 2019-07-04 DIAGNOSIS — I10 Essential (primary) hypertension: Secondary | ICD-10-CM

## 2019-07-04 DIAGNOSIS — J449 Chronic obstructive pulmonary disease, unspecified: Secondary | ICD-10-CM

## 2019-07-04 DIAGNOSIS — B0229 Other postherpetic nervous system involvement: Secondary | ICD-10-CM

## 2019-07-04 DIAGNOSIS — J9612 Chronic respiratory failure with hypercapnia: Secondary | ICD-10-CM

## 2019-07-04 DIAGNOSIS — F4323 Adjustment disorder with mixed anxiety and depressed mood: Secondary | ICD-10-CM

## 2019-07-04 LAB — CBC WITH DIFFERENTIAL/PLATELET
Basophils Absolute: 0 10*3/uL (ref 0.0–0.1)
Basophils Relative: 0.6 % (ref 0.0–3.0)
Eosinophils Absolute: 0.2 10*3/uL (ref 0.0–0.7)
Eosinophils Relative: 2.9 % (ref 0.0–5.0)
HCT: 43.7 % (ref 36.0–46.0)
Hemoglobin: 14.3 g/dL (ref 12.0–15.0)
Lymphocytes Relative: 21 % (ref 12.0–46.0)
Lymphs Abs: 1.7 10*3/uL (ref 0.7–4.0)
MCHC: 32.8 g/dL (ref 30.0–36.0)
MCV: 91.3 fl (ref 78.0–100.0)
Monocytes Absolute: 0.9 10*3/uL (ref 0.1–1.0)
Monocytes Relative: 11.7 % (ref 3.0–12.0)
Neutro Abs: 5.2 10*3/uL (ref 1.4–7.7)
Neutrophils Relative %: 63.8 % (ref 43.0–77.0)
Platelets: 279 10*3/uL (ref 150.0–400.0)
RBC: 4.79 Mil/uL (ref 3.87–5.11)
RDW: 13.7 % (ref 11.5–15.5)
WBC: 8.1 10*3/uL (ref 4.0–10.5)

## 2019-07-04 LAB — COMPREHENSIVE METABOLIC PANEL
ALT: 12 U/L (ref 0–35)
AST: 18 U/L (ref 0–37)
Albumin: 3.5 g/dL (ref 3.5–5.2)
Alkaline Phosphatase: 39 U/L (ref 39–117)
BUN: 31 mg/dL — ABNORMAL HIGH (ref 6–23)
CO2: 33 mEq/L — ABNORMAL HIGH (ref 19–32)
Calcium: 8.8 mg/dL (ref 8.4–10.5)
Chloride: 101 mEq/L (ref 96–112)
Creatinine, Ser: 1.13 mg/dL (ref 0.40–1.20)
GFR: 46.58 mL/min — ABNORMAL LOW (ref >60.00)
Glucose, Bld: 88 mg/dL (ref 70–99)
Potassium: 3.9 mEq/L (ref 3.5–5.1)
Sodium: 141 mEq/L (ref 135–145)
Total Bilirubin: 0.4 mg/dL (ref 0.2–1.2)
Total Protein: 6.4 g/dL (ref 6.0–8.3)

## 2019-07-04 LAB — LIPID PANEL
Cholesterol: 130 mg/dL (ref 0–200)
HDL: 49.2 mg/dL (ref >39.00)
LDL Cholesterol: 53 mg/dL (ref 0–99)
NonHDL: 80.72
Total CHOL/HDL Ratio: 3
Triglycerides: 141 mg/dL (ref 0.0–149.0)
VLDL: 28.2 mg/dL (ref 0.0–40.0)

## 2019-07-04 LAB — VITAMIN D 25 HYDROXY (VIT D DEFICIENCY, FRACTURES): VITD: 35.22 ng/mL (ref 30.00–100.00)

## 2019-07-04 LAB — MAGNESIUM: Magnesium: 1.7 mg/dL (ref 1.5–2.5)

## 2019-07-04 MED ORDER — MOMETASONE FUROATE 50 MCG/ACT NA SUSP: 2.0000 | Freq: Every day | NASAL | 3 refills | Status: DC

## 2019-07-04 MED ORDER — FUROSEMIDE 20 MG PO TABS: ORAL_TABLET | ORAL | 3 refills | Status: DC

## 2019-07-04 MED ORDER — ALBUTEROL SULFATE HFA 108 (90 BASE) MCG/ACT IN AERS: 1.0000 | INHALATION_SPRAY | Freq: Four times a day (QID) | RESPIRATORY_TRACT | 6 refills | Status: DC | PRN

## 2019-07-04 MED ORDER — SPIRIVA RESPIMAT 2.5 MCG/ACT IN AERS: INHALATION_SPRAY | RESPIRATORY_TRACT | 3 refills | Status: DC

## 2019-07-04 MED ORDER — RALOXIFENE HCL 60 MG PO TABS: 60.0000 mg | ORAL_TABLET | Freq: Every day | ORAL | 3 refills | Status: DC

## 2019-07-04 MED ORDER — METOPROLOL SUCCINATE ER 50 MG PO TB24: 50.0000 mg | ORAL_TABLET | Freq: Two times a day (BID) | ORAL | 3 refills | Status: DC

## 2019-07-04 MED ORDER — AMLODIPINE BESYLATE 10 MG PO TABS: 10.0000 mg | ORAL_TABLET | Freq: Every day | ORAL | 3 refills | Status: DC

## 2019-07-04 MED ORDER — LOVASTATIN 40 MG PO TABS: 40.0000 mg | ORAL_TABLET | Freq: Every day | ORAL | 3 refills | Status: DC

## 2019-07-04 MED ORDER — MAGNESIUM OXIDE 400 MG PO TABS: ORAL_TABLET | ORAL | 3 refills | Status: DC

## 2019-07-04 MED ORDER — COLESEVELAM HCL 625 MG PO TABS: ORAL_TABLET | ORAL | 3 refills | Status: DC

## 2019-07-04 MED ORDER — ALPRAZOLAM 0.25 MG PO TABS: ORAL_TABLET | ORAL | 1 refills | Status: DC

## 2019-07-04 MED ORDER — OMEPRAZOLE 20 MG PO CPDR: 20.0000 mg | DELAYED_RELEASE_CAPSULE | Freq: Every day | ORAL | 3 refills | Status: DC

## 2019-07-04 MED ORDER — GABAPENTIN 100 MG PO CAPS: ORAL_CAPSULE | ORAL | 3 refills | Status: DC

## 2019-07-04 MED ORDER — SYMBICORT 160-4.5 MCG/ACT IN AERO: INHALATION_SPRAY | RESPIRATORY_TRACT | 3 refills | Status: DC

## 2019-07-04 MED ORDER — CITALOPRAM HYDROBROMIDE 20 MG PO TABS: 20.0000 mg | ORAL_TABLET | Freq: Every day | ORAL | 3 refills | Status: DC

## 2019-07-04 MED ORDER — LORATADINE 10 MG PO TABS: 10.0000 mg | ORAL_TABLET | Freq: Every day | ORAL | 3 refills | Status: DC

## 2019-07-04 MED ORDER — FENOFIBRATE 145 MG PO TABS: 145.0000 mg | ORAL_TABLET | Freq: Every day | ORAL | 3 refills | Status: DC

## 2019-07-04 NOTE — Progress Notes (Signed)
Courtney Brown is a 78 y.o. female with the following history as recorded in EpicCare:  Patient Active Problem List   Diagnosis Date Noted  . Encounter for general adult medical examination with abnormal findings 06/03/2018  . Chronic respiratory failure with hypercapnia ([place]) 01/19/2018  . Chronic bilateral low back pain with right-sided sciatica 09/01/2017  . Colon polyps 08/08/2016  . PVD (peripheral vascular disease) ([place]) 08/08/2016  . COPD GOLD II 07/29/2016  . HTN (hypertension), benign 07/29/2016  . Hyperlipidemia 07/29/2016  . CAD (coronary artery disease) 07/29/2016  . Post herpetic neuralgia 07/29/2016  . Adjustment disorder with mixed anxiety and depressed mood 07/29/2016  . Osteoporosis 07/29/2016  . Gastroesophageal reflux disease without esophagitis 07/29/2016  . Hearing loss 07/29/2016    Current Outpatient Medications  Medication Sig Dispense Refill  . acetaminophen (TYLENOL) 500 MG tablet Take 500 mg by mouth every 6 (six) hours as needed.    Marland Kitchen albuterol (PROAIR HFA) 108 (90 Base) MCG/ACT inhaler Inhale 1-2 puffs into the lungs every 6 (six) hours as needed for wheezing or shortness of breath. 6.7 g 6  . ALPRAZolam (XANAX) 0.25 MG tablet TAKE 1 TABLET BY MOUTH AT BEDTIME AS NEEDED FOR ANXIETY 30 tablet 1  . amLODipine (NORVASC) 10 MG tablet Take 1 tablet (10 mg total) by mouth daily. 90 tablet 3  . aspirin 81 MG tablet Take 81 mg by mouth daily.    . Calcium Citrate-Vitamin D (CITRACAL + D PO) Take by mouth. Take twice a day as directed    . citalopram (CELEXA) 20 MG tablet Take 1 tablet (20 mg total) by mouth daily. 90 tablet 3  . colesevelam (WELCHOL) 625 MG tablet TAKE 1 TABLET BY MOUTH 2 TIMES DAILY WITH A MEAL 180 tablet 3  . fenofibrate (TRICOR) 145 MG tablet Take 1 tablet (145 mg total) by mouth daily. 90 tablet 3  . furosemide (LASIX) 20 MG tablet TAKE 1 TABLET BY MOUTH DAILY. FOR LEG EDEMA; may take 2nd dose as needed 180 tablet 3  . gabapentin (NEURONTIN)  100 MG capsule TAKE 1 CAPSULE BY MOUTH THREE TIMES A DAY 270 capsule 3  . Garlic Oil 2947 MG CAPS Take 1,000 mg by mouth 1 day or 1 dose.    . Lactobacillus (PROBIOTIC ACIDOPHILUS) CAPS Take by mouth once.    . loratadine (CLARITIN) 10 MG tablet Take 1 tablet (10 mg total) by mouth daily. 90 tablet 3  . lovastatin (MEVACOR) 40 MG tablet Take 1 tablet (40 mg total) by mouth at bedtime. 90 tablet 3  . magnesium oxide (MAG-OX) 400 MG tablet TAKE 1 TABLET BY MOUTH TWICE A DAY 180 tablet 3  . metoprolol succinate (TOPROL-XL) 50 MG 24 hr tablet Take 1 tablet (50 mg total) by mouth 2 (two) times daily. 180 tablet 3  . mometasone (NASONEX) 50 MCG/ACT nasal spray Place 2 sprays into the nose daily. 51 g 3  . nystatin ointment (MYCOSTATIN) Apply 1 application topically 2 (two) times daily. 30 g 0  . omeprazole (PRILOSEC) 20 MG capsule Take 1 capsule (20 mg total) by mouth daily. 90 capsule 3  . raloxifene (EVISTA) 60 MG tablet Take 1 tablet (60 mg total) by mouth daily. 90 tablet 3  . SPIRIVA RESPIMAT 2.5 MCG/ACT AERS 2 puff per day 12 g 3  . SYMBICORT 160-4.5 MCG/ACT inhaler 2 puffs bid 30.6 Inhaler 3  . triamcinolone cream (KENALOG) 0.1 % Apply 1 application topically 2 (two) times daily. 30 g 0   No  current facility-administered medications for this visit.     Allergies: Prednisone  Past Medical History:  Diagnosis Date  . Allergy   . Anxiety   . Colon polyps   . COPD (chronic obstructive pulmonary disease) ([place])   . Emphysema of lung ([place])   . GERD (gastroesophageal reflux disease)   . H/O hiatal hernia   . Hyperlipidemia   . Hypertension   . Inverted nipple    bilateral, pt says that's normal  . Myocardial infarct, old 2008   no stent placed  . Osteoporosis   . Stroke [facility])    TIA    Past Surgical History:  Procedure Laterality Date  . ABDOMINAL HYSTERECTOMY  1998  . 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 HERNIA REPAIR     umbilical, ventral  . INSERTION OF ILIAC  STENT  2008    Family History  Problem Relation Age of Onset  . Hearing loss Father   . Hearing loss Maternal Aunt   . Hearing loss Maternal Grandmother   . Cancer Maternal Grandmother        colon  . Arthritis Mother   . Cancer Paternal Grandmother        colon  . Breast cancer Neg Hx     Social History   Tobacco Use  . Smoking status: Former Smoker    Packs/day: 1.50    Years: 44.00    Pack years: 66.00    Types: Cigarettes    Quit date: 07/28/2005    Years since quitting: 13.9  . Smokeless tobacco: Never Used  Substance Use Topics  . Alcohol use: Yes    Alcohol/week: 2.0 standard drinks    Types: 2 Shots of liquor per week    Comment: social, weekly    Subjective:  Presents for yearly CPE; is scheduled for DEXA later today; needs to get fasting labs updated today; still wants to see surgeon about possible hernia- referral was done in July 2020 but patient was never contacted; defers colonoscopy until after seeing surgeon;   Review of Systems  Constitutional: Negative.   HENT: Negative.   Eyes: Negative.   Respiratory: Negative.   Cardiovascular: Negative.   Gastrointestinal: Positive for abdominal pain.  Genitourinary: Negative.   Musculoskeletal: Negative.   Skin: Negative.   Neurological: Negative.   Endo/Heme/Allergies: Negative.   Psychiatric/Behavioral: Negative for depression. The patient is nervous/anxious.      Health Maintenance  Topic Date Due  . COLONOSCOPY  07/03/2020 (Originally 05/27/2019)  . TETANUS/TDAP  11/04/2026  . INFLUENZA VACCINE  Completed  . DEXA SCAN  Completed  . PNA vac Low Risk Adult  Completed      Objective:  Vitals:   07/04/19 0958  BP: 104/62  Pulse: 74  Temp: 98 F (36.7 C)  TempSrc: Oral  SpO2: 94%  Weight: 160 lb 9.6 oz (72.8 kg)  Height: 5' (1.524 m)    General: Well developed, well nourished, in no acute distress  Skin : Warm and dry.  Head: Normocephalic and atraumatic  Eyes: Sclera and conjunctiva  clear; pupils round and reactive to light; extraocular movements intact  Ears: External normal; canals clear; tympanic membranes normal  Oropharynx: Pink, supple. No suspicious lesions  Neck: Supple without thyromegaly, adenopathy  Lungs: Respirations unlabored; clear to auscultation bilaterally without wheeze, rales, rhonchi  CVS exam: normal rate and regular rhythm.  Abdomen: Soft; nontender; nondistended; normoactive bowel sounds; no masses or hepatosplenomegaly  Musculoskeletal: No deformities; no active joint  inflammation  Extremities: No edema, cyanosis, clubbing  Vessels: Symmetric bilaterally  Neurologic: Alert and oriented; speech intact; face symmetrical; moves all extremities well; CNII-XII intact without focal deficit    Assessment:  1. PE (physical exam), annual   2. COPD mixed type ([place]) Chronic  3. PVD (peripheral vascular disease) ([facility]) Chronic  4. Chronic respiratory failure with hypercapnia ([facility]) Chronic  5. Adjustment disorder with mixed anxiety and depressed mood   6. HTN (hypertension), benign   7. Chronic obstructive pulmonary disease, unspecified COPD type ([place])   8. Mixed hyperlipidemia   9. PVD (peripheral vascular disease) ([place])   10. Post herpetic neuralgia   11. Hypomagnesemia   12. Gastroesophageal reflux disease without esophagitis   13. Age related osteoporosis, unspecified pathological fracture presence     Plan:  Age appropriate preventive healthcare needs addressed; encouraged regular eye doctor and dental exams; encouraged regular exercise; will update labs and refills as needed today; follow-up to be determined; Under care of pulmonology for COPD- continue as scheduled; Refills updated x 1 year;   This visit occurred during the SARS-CoV-2 public health emergency.  Safety protocols were in place, including screening questions prior to the visit, additional usage of staff PPE, and extensive cleaning of exam room while observing appropriate contact  time as indicated for disinfecting solutions.     No follow-ups on file.  Orders Placed This Encounter  Procedures  . CBC w/Diff    Standing Status:   Future    Number of Occurrences:   1    Standing Expiration Date:   07/03/2020  . Comp Met (CMET)    Standing Status:   Future    Number of Occurrences:   1    Standing Expiration Date:   07/03/2020  . Lipid panel    Standing Status:   Future    Number of Occurrences:   1    Standing Expiration Date:   07/03/2020  . Vitamin D (25 hydroxy)    Standing Status:   Future    Number of Occurrences:   1    Standing Expiration Date:   07/03/2020  . Magnesium    Standing Status:   Future    Number of Occurrences:   1    Standing Expiration Date:   07/03/2020    Requested Prescriptions   Signed Prescriptions Disp Refills  . citalopram (CELEXA) 20 MG tablet 90 tablet 3    Sig: Take 1 tablet (20 mg total) by mouth daily.  Marland Kitchen amLODipine (NORVASC) 10 MG tablet 90 tablet 3    Sig: Take 1 tablet (10 mg total) by mouth daily.  Marland Kitchen albuterol (PROAIR HFA) 108 (90 Base) MCG/ACT inhaler 6.7 g 6    Sig: Inhale 1-2 puffs into the lungs every 6 (six) hours as needed for wheezing or shortness of breath.  . colesevelam (WELCHOL) 625 MG tablet 180 tablet 3    Sig: TAKE 1 TABLET BY MOUTH 2 TIMES DAILY WITH A MEAL  . fenofibrate (TRICOR) 145 MG tablet 90 tablet 3    Sig: Take 1 tablet (145 mg total) by mouth daily.  . furosemide (LASIX) 20 MG tablet 180 tablet 3    Sig: TAKE 1 TABLET BY MOUTH DAILY. FOR LEG EDEMA; may take 2nd dose as needed  . gabapentin (NEURONTIN) 100 MG capsule 270 capsule 3    Sig: TAKE 1 CAPSULE BY MOUTH THREE TIMES A DAY  . loratadine (CLARITIN) 10 MG tablet 90 tablet 3    Sig: Take  1 tablet (10 mg total) by mouth daily.  Marland Kitchen lovastatin (MEVACOR) 40 MG tablet 90 tablet 3    Sig: Take 1 tablet (40 mg total) by mouth at bedtime.  . magnesium oxide (MAG-OX) 400 MG tablet 180 tablet 3    Sig: TAKE 1 TABLET BY MOUTH TWICE A DAY  .  metoprolol succinate (TOPROL-XL) 50 MG 24 hr tablet 180 tablet 3    Sig: Take 1 tablet (50 mg total) by mouth 2 (two) times daily.  . mometasone (NASONEX) 50 MCG/ACT nasal spray 51 g 3    Sig: Place 2 sprays into the nose daily.  Marland Kitchen omeprazole (PRILOSEC) 20 MG capsule 90 capsule 3    Sig: Take 1 capsule (20 mg total) by mouth daily.  . raloxifene (EVISTA) 60 MG tablet 90 tablet 3    Sig: Take 1 tablet (60 mg total) by mouth daily.  Marland Kitchen SPIRIVA RESPIMAT 2.5 MCG/ACT AERS 12 g 3    Sig: 2 puff per day  . SYMBICORT 160-4.5 MCG/ACT inhaler 30.6 Inhaler 3    Sig: 2 puffs bid  . ALPRAZolam (XANAX) 0.25 MG tablet 30 tablet 1    Sig: TAKE 1 TABLET BY MOUTH AT BEDTIME AS NEEDED FOR ANXIETY

## 2019-07-20 ENCOUNTER — Telehealth: Payer: Self-pay | Admitting: *Deleted

## 2019-07-20 DIAGNOSIS — J449 Chronic obstructive pulmonary disease, unspecified: Secondary | ICD-10-CM | POA: Diagnosis not present

## 2019-07-20 DIAGNOSIS — K432 Incisional hernia without obstruction or gangrene: Secondary | ICD-10-CM | POA: Diagnosis not present

## 2019-07-20 DIAGNOSIS — I252 Old myocardial infarction: Secondary | ICD-10-CM | POA: Diagnosis not present

## 2019-07-20 NOTE — Telephone Encounter (Signed)
Did we get an actual referral? Is there a reason she is being asked for clearance or is it just because someone saw her on aspirin. I don't see any cardiac history.   Preop callback:  Please clarify with requesting provider and/or patient.

## 2019-07-20 NOTE — Telephone Encounter (Signed)
Our office did receive an actual referral from the surgeons office. I have sent a message to our scheduling team to schedule an appt.   I called Dr. Marylou Mccoy office and did confirm the pt will indeed need cardiac clearance. I did inform the surgeon's office that we have never seen the pt and she will need a New Pt appt.I thanked Dr. Marylou Mccoy office for their help.

## 2019-07-20 NOTE — Telephone Encounter (Signed)
A message was left, re: her new patient appointment. 

## 2019-07-20 NOTE — Telephone Encounter (Signed)
   [facility] Pre-operative Risk Assessment    Request for surgical clearance: THIS WILL BE A NEW PT. I WILL SEND MESSAGE TO SCHEDULERS TO PLEASE SCHEDULE NEW PT APPT FOR SURGERY CLEARANCE.   1. What type of surgery is being performed? HERNIA SURGERY   2. When is this surgery scheduled? TBD   3. What type of clearance is required (medical clearance vs. Pharmacy clearance to hold med vs. Both)?  MEDICAL  4. Are there any medications that need to be held prior to surgery and how long? ASA    5. Practice name and name of physician performing surgery? [facility]; DR. Volney American  6. What is your office phone number [phone]    7.   What is your office fax number [phone]  8.   Anesthesia type (None, local, MAC, general) ? GENERAL   Julaine Hua 07/20/2019, 11:10 AM  _________________________________________________________________   (provider comments below)

## 2019-07-21 NOTE — Telephone Encounter (Signed)
Pt has been scheduled to see Dr. Gwenlyn Found 08/19/19. I will forward clearance notes to Dr. Gwenlyn Found for the upcoming appt. I will forward notes to surgeon as FYI about appt. I will remove from the pre op call back pool.

## 2019-07-28 ENCOUNTER — Telehealth: Payer: Self-pay

## 2019-07-28 NOTE — Telephone Encounter (Addendum)
   Primary Cardiologist: New patient - scheduled with Dr. Gwenlyn Found  Chart reviewed as part of pre-operative protocol coverage.   This is the second request received. Initial request received 07/20/19, it was confirmed by Dr. Marylou Mccoy office that cardiac clearance was requested. She was scheduled for a new patient appointment with Dr. Gwenlyn Found on 08/19/2019.   The requesting surgeon's office has previously been made aware of this appt. Cardia clearance and aspirin recommendations will be provided at that time.   Loel Dubonnet, NP 07/28/2019, 10:26 AM

## 2019-07-28 NOTE — Telephone Encounter (Signed)
   [facility] Pre-operative Risk Assessment    Request for surgical clearance:  1. What type of surgery is being performed? Hernia Surgery  2. When is this surgery scheduled? TBD  3. What type of clearance is required (medical clearance vs. Pharmacy clearance to hold med vs. Both)? Both  4. Are there any medications that need to be held prior to surgery and how long? Aspirin  5. Practice name and name of physician performing surgery? [facility]   Dr.Luke Kinsinger  6. What is your office phone number [phone]   7.   What is your office fax number [phone]  8.   Anesthesia type  General   Kathyrn Lass 07/28/2019, 9:34 AM  _________________________________________________________________   (provider comments below)

## 2019-08-03 ENCOUNTER — Other Ambulatory Visit: Payer: Self-pay | Admitting: Family

## 2019-08-03 DIAGNOSIS — I739 Peripheral vascular disease, unspecified: Secondary | ICD-10-CM

## 2019-08-19 ENCOUNTER — Encounter: Payer: Self-pay | Admitting: Cardiovascular Disease

## 2019-08-19 ENCOUNTER — Ambulatory Visit (INDEPENDENT_AMBULATORY_CARE_PROVIDER_SITE_OTHER): Payer: Medicare Other | Admitting: Cardiovascular Disease

## 2019-08-19 ENCOUNTER — Other Ambulatory Visit: Payer: Self-pay

## 2019-08-19 VITALS — BP 131/59 | HR 68 | Temp 97.2°F | Ht 60.0 in | Wt 164.4 lb

## 2019-08-19 DIAGNOSIS — Z01818 Encounter for other preprocedural examination: Secondary | ICD-10-CM | POA: Diagnosis not present

## 2019-08-19 DIAGNOSIS — I10 Essential (primary) hypertension: Secondary | ICD-10-CM

## 2019-08-19 DIAGNOSIS — Z01810 Encounter for preprocedural cardiovascular examination: Secondary | ICD-10-CM

## 2019-08-19 DIAGNOSIS — I2510 Atherosclerotic heart disease of native coronary artery without angina pectoris: Secondary | ICD-10-CM

## 2019-08-19 DIAGNOSIS — E782 Mixed hyperlipidemia: Secondary | ICD-10-CM | POA: Diagnosis not present

## 2019-08-19 DIAGNOSIS — Z720 Tobacco use: Secondary | ICD-10-CM

## 2019-08-19 NOTE — Assessment & Plan Note (Signed)
Patient referred by Dr. Kieth Brightly, general surgeon, for preoperative clearance before elective ventral hernia repair.  Patient's risk factor includes 90-pack-year tobacco abuse having quit 13 years ago, treated hypertension and hyperlipidemia.  She apparently had a heart catheterization in [place] 7/08 that was unremarkable.  She denies chest pain.  I am going get a 2D echo to the assess LV function and if this is normal we will clear her for upcoming procedure which is relatively low risk.

## 2019-08-19 NOTE — Assessment & Plan Note (Signed)
History of chronic catheterization in 2008 in [place] that revealed no evidence of significant CAD.

## 2019-08-19 NOTE — Patient Instructions (Signed)
Medication Instructions:  Your physician recommends that you continue on your current medications as directed. Please refer to the Current Medication list given to you today.  If you need a refill on your cardiac medications before your next appointment, please call your pharmacy.   Lab work: NONE  Testing/Procedures: Your physician has requested that you have an echocardiogram. Echocardiography is a painless test that uses sound waves to create images of your heart. It provides your doctor with information about the size and shape of your heart and how well your heart's chambers and valves are working. This procedure takes approximately one hour. There are no restrictions for this procedure. [address]  Follow-Up: At [facility], you and your health needs are our priority.  As part of our continuing mission to provide you with exceptional heart care, we have created designated Provider Care Teams.  These Care Teams include your primary Cardiologist (physician) and Advanced Practice Providers (APPs -  Physician Assistants and Nurse Practitioners) who all work together to provide you with the care you need, when you need it. You may see Dr. Berry or one of the following Advanced Practice Providers on your designated Care Team:    Luke Kilroy, PA-C  Callie Goodrich, PA-C  Jesse Cleaver, FNP  Your physician wants you to follow-up as needed   

## 2019-08-19 NOTE — Assessment & Plan Note (Signed)
History of essential potential blood pressure measured today at 131/59.  She is on amlodipine and metoprolol.

## 2019-08-19 NOTE — Assessment & Plan Note (Signed)
History of hyperlipidemia on statin therapy with lipid profile performed 07/04/2019 revealing total cholesterol of 130, LDL 53 and HDL 49.

## 2019-08-19 NOTE — Assessment & Plan Note (Signed)
90-pack-year history tobacco abuse having quit 13 years ago back in 2009.  She does have COPD.

## 2019-08-19 NOTE — Progress Notes (Signed)
08/19/2019 Courtney Brown   [birth year]  [id number]  Primary Physician [name], FNP Primary Cardiologist: Lorretta Harp MD [name], [place]  HPI:  Courtney Brown is a 79 y.o. moderately overweight widowed Caucasian female mother of 37, grandmother of 2 grandchildren referred by Dr. Kieth Brightly, general surgeon, for preoperative clearance prior to elective ventral hernia repair.  Patient relocated to [facility] 7/17 from [place].  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  Risk factors include 90-pack-year history tobacco abuse having quit in 2009, treated hypertension and hyperlipidemia.  She has had a TIA in the past and question MI with cath 7/08 in [place] that was unremarkable.  She denies chest pain but does get short of breath because of her COPD.  She has a ventral hernia which requires repair electively she was referred here for preoperative clearance.   Current Meds  Medication Sig  . acetaminophen (TYLENOL) 500 MG tablet Take 500 mg by mouth every 6 (six) hours as needed.  Marland Kitchen albuterol (PROAIR HFA) 108 (90 Base) MCG/ACT inhaler Inhale 1-2 puffs into the lungs every 6 (six) hours as needed for wheezing or shortness of breath.  . ALPRAZolam (XANAX) 0.25 MG tablet TAKE 1 TABLET BY MOUTH AT BEDTIME AS NEEDED FOR ANXIETY  . amLODipine (NORVASC) 10 MG tablet Take 1 tablet (10 mg total) by mouth daily.  Marland Kitchen aspirin 81 MG tablet Take 81 mg by mouth daily.  . Calcium Citrate-Vitamin D (CITRACAL + D PO) Take by mouth. Take twice a day as directed  . citalopram (CELEXA) 20 MG tablet Take 1 tablet (20 mg total) by mouth daily.  . colesevelam (WELCHOL) 625 MG tablet TAKE 1 TABLET BY MOUTH 2 TIMES DAILY WITH A MEAL  . fenofibrate (TRICOR) 145 MG tablet Take 1 tablet (145 mg total) by mouth daily.  . furosemide (LASIX) 20 MG tablet TAKE 1 TABLET BY MOUTH DAILY. FOR LEG EDEMA  . gabapentin (NEURONTIN) 100 MG capsule TAKE 1 CAPSULE BY  MOUTH THREE TIMES A DAY  . Garlic Oil 123XX123 MG CAPS Take 1,000 mg by mouth 1 day or 1 dose.  . Lactobacillus (PROBIOTIC ACIDOPHILUS) CAPS Take by mouth once.  . loratadine (CLARITIN) 10 MG tablet Take 1 tablet (10 mg total) by mouth daily.  Marland Kitchen lovastatin (MEVACOR) 40 MG tablet Take 1 tablet (40 mg total) by mouth at bedtime.  . magnesium oxide (MAG-OX) 400 MG tablet TAKE 1 TABLET BY MOUTH TWICE A DAY  . metoprolol succinate (TOPROL-XL) 50 MG 24 hr tablet Take 1 tablet (50 mg total) by mouth 2 (two) times daily.  . mometasone (NASONEX) 50 MCG/ACT nasal spray Place 2 sprays into the nose daily.  Marland Kitchen nystatin ointment (MYCOSTATIN) Apply 1 application topically 2 (two) times daily.  Marland Kitchen omeprazole (PRILOSEC) 20 MG capsule Take 1 capsule (20 mg total) by mouth daily.  . raloxifene (EVISTA) 60 MG tablet Take 1 tablet (60 mg total) by mouth daily.  Marland Kitchen SPIRIVA RESPIMAT 2.5 MCG/ACT AERS 2 puff per day  . SYMBICORT 160-4.5 MCG/ACT inhaler 2 puffs bid  . triamcinolone cream (KENALOG) 0.1 % Apply 1 application topically 2 (two) times daily.     Allergies  Allergen Reactions  . Prednisone Other (See Comments)    Leg pain,light headed    Social History   Socioeconomic History  . Marital status: Widowed    Spouse name: Not on file  . Number of children: Not on file  .  Years of education: Not on file  . Highest education level: Not on file  Occupational History  . Not on file  Tobacco Use  . Smoking status: Former Smoker    Packs/day: 1.50    Years: 44.00    Pack years: 66.00    Types: Cigarettes    Quit date: 07/28/2005    Years since quitting: 14.0  . Smokeless tobacco: Never Used  Substance and Sexual Activity  . Alcohol use: Yes    Alcohol/week: 2.0 standard drinks    Types: 2 Shots of liquor per week    Comment: social, weekly  . Drug use: No  . Sexual activity: Not Currently  Other Topics Concern  . Not on file  Social History Narrative  . Not on file   Social Determinants of Health    Financial Resource Strain:   . Difficulty of Paying Living Expenses: Not on file  Food Insecurity:   . Worried About Charity fundraiser in the Last Year: Not on file  . Ran Out of Food in the Last Year: Not on file  Transportation Needs:   . Lack of Transportation (Medical): Not on file  . Lack of Transportation (Non-Medical): Not on file  Physical Activity:   . Days of Exercise per Week: Not on file  . Minutes of Exercise per Session: Not on file  Stress:   . Feeling of Stress : Not on file  Social Connections:   . Frequency of Communication with Friends and Family: Not on file  . Frequency of Social Gatherings with Friends and Family: Not on file  . Attends Religious Services: Not on file  . Active Member of Clubs or Organizations: Not on file  . Attends Archivist Meetings: Not on file  . Marital Status: Not on file  Intimate Partner Violence:   . Fear of Current or Ex-Partner: Not on file  . Emotionally Abused: Not on file  . Physically Abused: Not on file  . Sexually Abused: Not on file     Review of Systems: General: negative for chills, fever, night sweats or weight changes.  Cardiovascular: negative for chest pain, dyspnea on exertion, edema, orthopnea, palpitations, paroxysmal nocturnal dyspnea or shortness of breath Dermatological: negative for rash Respiratory: negative for cough or wheezing Urologic: negative for hematuria Abdominal: negative for nausea, vomiting, diarrhea, bright red blood per rectum, melena, or hematemesis Neurologic: negative for visual changes, syncope, or dizziness All other systems reviewed and are otherwise negative except as noted above.    Blood pressure (!) 131/59, pulse 68, temperature (!) 97.2 F (36.2 C), height 5' (1.524 m), weight 164 lb 6.4 oz (74.6 kg), SpO2 92 %.  General appearance: alert and no distress Neck: no adenopathy, no carotid bruit, no JVD, supple, symmetrical, trachea midline and thyroid not enlarged,  symmetric, no tenderness/mass/nodules Lungs: clear to auscultation bilaterally Heart: regular rate and rhythm, S1, S2 normal, no murmur, click, rub or gallop Extremities: extremities normal, atraumatic, no cyanosis or edema Pulses: 2+ and symmetric Skin: Skin color, texture, turgor normal. No rashes or lesions Neurologic: Alert and oriented X 3, normal strength and tone. Normal symmetric reflexes. Normal coordination and gait  EKG sinus rhythm at 60 with left axis deviation and nonspecific ST and T wave changes.  I personally reviewed this EKG.  ASSESSMENT AND PLAN:   HTN (hypertension), benign History of essential potential blood pressure measured today at 131/59.  She is on amlodipine and metoprolol.  Hyperlipidemia History of hyperlipidemia on  statin therapy with lipid profile performed 07/04/2019 revealing total cholesterol of 130, LDL 53 and HDL 49.  CAD (coronary artery disease) History of chronic catheterization in 2008 in [place] that revealed no evidence of significant CAD.  Preoperative clearance Patient referred by Dr. Kieth Brightly, general surgeon, for preoperative clearance before elective ventral hernia repair.  Patient's risk factor includes 90-pack-year tobacco abuse having quit 13 years ago, treated hypertension and hyperlipidemia.  She apparently had a heart catheterization in [place] 7/08 that was unremarkable.  She denies chest pain.  I am going get a 2D echo to the assess LV function and if this is normal we will clear her for upcoming procedure which is relatively low risk.  Tobacco abuse 90-pack-year history tobacco abuse having quit 13 years ago back in 2009.  She does have COPD.      Lorretta Harp MD FACP,FACC,FAHA, [facility] 08/19/2019 2:27 PM

## 2019-08-22 ENCOUNTER — Encounter: Payer: Self-pay | Admitting: Family

## 2019-08-29 ENCOUNTER — Other Ambulatory Visit (HOSPITAL_COMMUNITY): Payer: Medicare Other

## 2019-08-31 ENCOUNTER — Emergency Department (HOSPITAL_COMMUNITY): Payer: Medicare Other

## 2019-08-31 ENCOUNTER — Encounter (HOSPITAL_COMMUNITY): Payer: Self-pay | Admitting: Family Medicine

## 2019-08-31 ENCOUNTER — Other Ambulatory Visit (HOSPITAL_COMMUNITY): Payer: Medicare Other

## 2019-08-31 ENCOUNTER — Emergency Department (HOSPITAL_COMMUNITY)
Admission: EM | Admit: 2019-08-31 | Discharge: 2019-08-31 | Disposition: A | Discharge status: Home / Self Care | Payer: Medicare Other | Attending: Emergency Medicine | Admitting: Emergency Medicine

## 2019-08-31 ENCOUNTER — Encounter (HOSPITAL_COMMUNITY): Payer: Self-pay

## 2019-08-31 ENCOUNTER — Other Ambulatory Visit: Payer: Self-pay

## 2019-08-31 DIAGNOSIS — W19XXXA Unspecified fall, initial encounter: Secondary | ICD-10-CM

## 2019-08-31 DIAGNOSIS — S40012A Contusion of left shoulder, initial encounter: Secondary | ICD-10-CM

## 2019-08-31 DIAGNOSIS — I10 Essential (primary) hypertension: Secondary | ICD-10-CM | POA: Insufficient documentation

## 2019-08-31 DIAGNOSIS — I252 Old myocardial infarction: Secondary | ICD-10-CM | POA: Insufficient documentation

## 2019-08-31 DIAGNOSIS — Y92242 Post office as the place of occurrence of the external cause: Secondary | ICD-10-CM | POA: Insufficient documentation

## 2019-08-31 DIAGNOSIS — S161XXA Strain of muscle, fascia and tendon at neck level, initial encounter: Secondary | ICD-10-CM

## 2019-08-31 DIAGNOSIS — R52 Pain, unspecified: Secondary | ICD-10-CM | POA: Diagnosis not present

## 2019-08-31 DIAGNOSIS — Z87891 Personal history of nicotine dependence: Secondary | ICD-10-CM | POA: Diagnosis not present

## 2019-08-31 DIAGNOSIS — W101XXA Fall (on)(from) sidewalk curb, initial encounter: Secondary | ICD-10-CM | POA: Insufficient documentation

## 2019-08-31 DIAGNOSIS — Z79899 Other long term (current) drug therapy: Secondary | ICD-10-CM | POA: Diagnosis not present

## 2019-08-31 DIAGNOSIS — S60222A Contusion of left hand, initial encounter: Secondary | ICD-10-CM

## 2019-08-31 DIAGNOSIS — S4992XA Unspecified injury of left shoulder and upper arm, initial encounter: Secondary | ICD-10-CM | POA: Diagnosis not present

## 2019-08-31 DIAGNOSIS — J449 Chronic obstructive pulmonary disease, unspecified: Secondary | ICD-10-CM | POA: Diagnosis not present

## 2019-08-31 DIAGNOSIS — S6992XA Unspecified injury of left wrist, hand and finger(s), initial encounter: Secondary | ICD-10-CM | POA: Diagnosis not present

## 2019-08-31 DIAGNOSIS — S8002XA Contusion of left knee, initial encounter: Secondary | ICD-10-CM | POA: Diagnosis not present

## 2019-08-31 DIAGNOSIS — S8001XA Contusion of right knee, initial encounter: Secondary | ICD-10-CM | POA: Diagnosis not present

## 2019-08-31 DIAGNOSIS — S199XXA Unspecified injury of neck, initial encounter: Secondary | ICD-10-CM | POA: Diagnosis not present

## 2019-08-31 DIAGNOSIS — Z8673 Personal history of transient ischemic attack (TIA), and cerebral infarction without residual deficits: Secondary | ICD-10-CM | POA: Insufficient documentation

## 2019-08-31 DIAGNOSIS — Z7982 Long term (current) use of aspirin: Secondary | ICD-10-CM | POA: Insufficient documentation

## 2019-08-31 DIAGNOSIS — Y999 Unspecified external cause status: Secondary | ICD-10-CM | POA: Diagnosis not present

## 2019-08-31 DIAGNOSIS — S0083XA Contusion of other part of head, initial encounter: Secondary | ICD-10-CM | POA: Diagnosis not present

## 2019-08-31 DIAGNOSIS — S8000XA Contusion of unspecified knee, initial encounter: Secondary | ICD-10-CM

## 2019-08-31 DIAGNOSIS — S0990XA Unspecified injury of head, initial encounter: Secondary | ICD-10-CM | POA: Diagnosis not present

## 2019-08-31 DIAGNOSIS — M25562 Pain in left knee: Secondary | ICD-10-CM | POA: Diagnosis not present

## 2019-08-31 DIAGNOSIS — S80212A Abrasion, left knee, initial encounter: Secondary | ICD-10-CM | POA: Diagnosis not present

## 2019-08-31 DIAGNOSIS — Y9301 Activity, walking, marching and hiking: Secondary | ICD-10-CM | POA: Insufficient documentation

## 2019-08-31 DIAGNOSIS — S8992XA Unspecified injury of left lower leg, initial encounter: Secondary | ICD-10-CM | POA: Diagnosis not present

## 2019-08-31 DIAGNOSIS — Z743 Need for continuous supervision: Secondary | ICD-10-CM | POA: Diagnosis not present

## 2019-08-31 DIAGNOSIS — R58 Hemorrhage, not elsewhere classified: Secondary | ICD-10-CM | POA: Diagnosis not present

## 2019-08-31 DIAGNOSIS — S0993XA Unspecified injury of face, initial encounter: Secondary | ICD-10-CM | POA: Diagnosis not present

## 2019-08-31 DIAGNOSIS — S8991XA Unspecified injury of right lower leg, initial encounter: Secondary | ICD-10-CM | POA: Diagnosis not present

## 2019-08-31 MED ORDER — HYDROCODONE-ACETAMINOPHEN 5-325 MG PO TABS: 1.0000 | ORAL_TABLET | Freq: Four times a day (QID) | ORAL | 0 refills | Status: DC | PRN

## 2019-08-31 MED ORDER — HYDROCODONE-ACETAMINOPHEN 5-325 MG PO TABS
2.0000 | ORAL_TABLET | Freq: Once | ORAL | Status: AC
  Administered 2019-08-31: 2 via ORAL
  Filled 2019-08-31: qty 2

## 2019-08-31 NOTE — ED Provider Notes (Signed)
[facility] DEPT Provider Note   CSN: [id number] Arrival date & time: 08/31/19  1603     History Chief Complaint  Patient presents with  . Fall    Courtney Brown is a 79 y.o. female.  Patient is a 79 year old female with past medical history of hypertension, coronary artery disease, hyperlipidemia.  She presents today for evaluation of fall.  [place] she [place] curb and fell forward.  She struck her face on the ground, but denies LOC.  She is complaining of headache, neck pain, bilateral knee pain, left shoulder pain, and left hand pain.  The history is provided by the patient.  Fall This is a new problem. The current episode started less than 1 hour ago. The problem occurs constantly. The problem has not changed since onset.Associated symptoms include headaches. Pertinent negatives include no chest pain, no abdominal pain and no shortness of breath. Exacerbated by: Movement and ambulation. Nothing relieves the symptoms. She has tried nothing for the symptoms.       Past Medical History:  Diagnosis Date  . Allergy   . Anxiety   . Colon polyps   . COPD (chronic obstructive pulmonary disease) ([place])   . Emphysema of lung ([place])   . GERD (gastroesophageal reflux disease)   . H/O hiatal hernia   . Hyperlipidemia   . Hypertension   . Inverted nipple    bilateral, pt says that's normal  . Myocardial infarct, old 2008   no stent placed  . Osteoporosis   . Stroke [facility])    TIA    Patient Active Problem List   Diagnosis Date Noted  . Preoperative clearance 08/19/2019  . Tobacco abuse 08/19/2019  . Encounter for general adult medical examination with abnormal findings 06/03/2018  . Chronic respiratory failure with hypercapnia ([place]) 01/19/2018  . Chronic bilateral low back pain with right-sided sciatica 09/01/2017  . Colon polyps 08/08/2016  . PVD (peripheral vascular disease) ([place]) 08/08/2016  .  COPD GOLD II 07/29/2016  . HTN (hypertension), benign 07/29/2016  . Hyperlipidemia 07/29/2016  . CAD (coronary artery disease) 07/29/2016  . Post herpetic neuralgia 07/29/2016  . Adjustment disorder with mixed anxiety and depressed mood 07/29/2016  . Osteoporosis 07/29/2016  . Gastroesophageal reflux disease without esophagitis 07/29/2016  . Hearing loss 07/29/2016    Past Surgical History:  Procedure Laterality Date  . ABDOMINAL HYSTERECTOMY  1998  . 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 HERNIA REPAIR     umbilical, ventral  . INSERTION OF ILIAC STENT  2008     OB History   No obstetric history on file.     Family History  Problem Relation Age of Onset  . Hearing loss Father   . Hearing loss Maternal Aunt   . Hearing loss Maternal Grandmother   . Cancer Maternal Grandmother        colon  . Arthritis Mother   . Cancer Paternal Grandmother        colon  . Breast cancer Neg Hx     Social History   Tobacco Use  . Smoking status: Former Smoker    Packs/day: 1.50    Years: 44.00    Pack years: 66.00    Types: Cigarettes    Quit date: 07/28/2005    Years since quitting: 14.1  . Smokeless tobacco: Never Used  Substance Use Topics  . Alcohol use: Yes    Alcohol/week: 2.0  standard drinks    Types: 2 Shots of liquor per week    Comment: social, weekly  . Drug use: No    Home Medications Prior to Admission medications   Medication Sig Start Date End Date Taking? Authorizing Provider  acetaminophen (TYLENOL) 500 MG tablet Take 500 mg by mouth every 6 (six) hours as needed.    [provider]  albuterol (PROAIR HFA) 108 (90 Base) MCG/ACT inhaler Inhale 1-2 puffs into the lungs every 6 (six) hours as needed for wheezing or shortness of breath. 07/04/19   [name], FNP  ALPRAZolam Duanne Moron) 0.25 MG tablet TAKE 1 TABLET BY MOUTH AT BEDTIME AS NEEDED FOR ANXIETY 07/04/19   [name], FNP  amLODipine (NORVASC) 10 MG tablet Take  1 tablet (10 mg total) by mouth daily. 07/04/19   [name], FNP  aspirin 81 MG tablet Take 81 mg by mouth daily.    [provider]  Calcium Citrate-Vitamin D (CITRACAL + D PO) Take by mouth. Take twice a day as directed    [provider]  citalopram (CELEXA) 20 MG tablet Take 1 tablet (20 mg total) by mouth daily. 07/04/19   [name], FNP  colesevelam [facility]) 625 MG tablet TAKE 1 TABLET BY MOUTH 2 TIMES DAILY WITH A MEAL 07/04/19   [name], FNP  fenofibrate (TRICOR) 145 MG tablet Take 1 tablet (145 mg total) by mouth daily. 07/04/19   [name], FNP  furosemide (LASIX) 20 MG tablet TAKE 1 TABLET BY MOUTH DAILY. FOR LEG EDEMA 08/03/19   [name], FNP  gabapentin (NEURONTIN) 100 MG capsule TAKE 1 CAPSULE BY MOUTH THREE TIMES A DAY 07/04/19   [name], FNP  Garlic Oil 123XX123 MG CAPS Take 1,000 mg by mouth 1 day or 1 dose.    [provider]  Lactobacillus (PROBIOTIC ACIDOPHILUS) CAPS Take by mouth once.    [provider]  loratadine (CLARITIN) 10 MG tablet Take 1 tablet (10 mg total) by mouth daily. 07/04/19   [name], FNP  lovastatin (MEVACOR) 40 MG tablet Take 1 tablet (40 mg total) by mouth at bedtime. 07/04/19   [name], FNP  magnesium oxide (MAG-OX) 400 MG tablet TAKE 1 TABLET BY MOUTH TWICE A DAY 07/04/19   [name], FNP  metoprolol succinate (TOPROL-XL) 50 MG 24 hr tablet Take 1 tablet (50 mg total) by mouth 2 (two) times daily. 07/04/19   [name], FNP  mometasone (NASONEX) 50 MCG/ACT nasal spray Place 2 sprays into the nose daily. 07/04/19   [name], FNP  nystatin ointment (MYCOSTATIN) Apply 1 application topically 2 (two) times daily. 09/01/17   Lance Sell, NP  omeprazole (PRILOSEC) 20 MG capsule Take 1 capsule (20 mg total) by mouth daily. 07/04/19   [name], FNP  raloxifene (EVISTA) 60 MG  tablet Take 1 tablet (60 mg total) by mouth daily. 07/04/19   [name], FNP  SPIRIVA RESPIMAT 2.5 MCG/ACT AERS 2 puff per day 07/04/19   [name], FNP  [facility] 160-4.5 MCG/ACT inhaler 2 puffs bid 07/04/19   [name], FNP  triamcinolone cream (KENALOG) 0.1 % Apply 1 application topically 2 (two) times daily. 09/01/17   Lance Sell, NP    Allergies    Prednisone  Review of Systems   Review of Systems  Respiratory: Negative for shortness of breath.   Cardiovascular: Negative for chest pain.  Gastrointestinal: Negative for abdominal pain.  Neurological: Positive for headaches.  All other systems reviewed and are negative.   Physical Exam Updated Vital Signs BP 134/61 (BP Location: Left Arm)   Pulse 64   Temp (!) 97.4 F (36.3 C) (Oral)   Resp 20   SpO2 93%   Physical Exam Vitals and nursing note reviewed.  Constitutional:      General: She is not in acute distress.    Appearance: She is well-developed. She is not diaphoretic.  HENT:     Head: Normocephalic and atraumatic.     Comments: There are abrasions to the left cheek and the left supraorbital region, but no laceration. Eyes:     Extraocular Movements: Extraocular movements intact.     Pupils: Pupils are equal, round, and reactive to light.     Comments: There is some swelling to the soft tissues of the left cheek and supraorbital ridge, however there is no evidence for any eye injury.  Patient has full range of motion of both eyes and no swelling.  Neck:     Comments: There is tenderness to palpation in the soft tissues of the cervical region.  There is no bony tenderness or step-off. Cardiovascular:     Rate and Rhythm: Normal rate and regular rhythm.     Heart sounds: No murmur. No friction rub. No gallop.   Pulmonary:     Effort: Pulmonary effort is normal. No respiratory distress.     Breath sounds: Normal breath sounds. No wheezing.  Abdominal:     General: Bowel  sounds are normal. There is no distension.     Palpations: Abdomen is soft.     Tenderness: There is no abdominal tenderness.  Musculoskeletal:        General: Normal range of motion.     Cervical back: Normal range of motion and neck supple.     Comments: The right knee has a moderate sized effusion noted.  There are abrasions to the patellar region.  She has good range of motion with no crepitus or significant discomfort.  There is minimal laxity with varus and valgus stress.  Anterior and posterior drawer tests are negative.  The left knee has a superficial abrasion noted over the patella.  There is no effusion.  She has good range of motion with no crepitus.  There is no varus or valgus laxity and anterior and posterior drawer test are negative.  The left hand has ecchymosis over the first metacarpal and proximal phalanx of the thumb.  There is no obvious deformity.  She has good range of motion, sensation, and motor and cap refill are intact.  Skin:    General: Skin is warm and dry.  Neurological:     General: No focal deficit present.     Mental Status: She is alert and oriented to person, place, and time.     Cranial Nerves: No cranial nerve deficit.     Motor: No weakness.     Coordination: Coordination normal.     ED Results / Procedures / Treatments   Labs (all labs ordered are listed, but only abnormal results are displayed) Labs Reviewed - No data to display  EKG None  Radiology No results found.  Procedures Procedures (including critical care time)  Medications Ordered in ED Medications - No data to display  ED Course  I have reviewed the triage vital signs and the nursing notes.  Pertinent labs & imaging results that were available during my  care of the patient were reviewed by me and considered in my medical decision making (see chart for details).    MDM Rules/Calculators/A&P  Patient presenting after a fall, the details of which are described in the  HPI.  She has abrasions to the face, headache, and pain in both knees, left hand, and left shoulder.  Imaging studies of these areas are all unremarkable.  Patient is neurologically intact and vitals are stable.  At this point, I feel as though she is appropriate for discharge with medicine for pain, rest, ice, and follow-up as needed.  Final Clinical Impression(s) / ED Diagnoses Final diagnoses:  None    Rx / DC Orders ED Discharge Orders    None       Veryl Speak, MD 08/31/19 1851

## 2019-08-31 NOTE — ED Notes (Signed)
Pt verbalizes understanding of DC instructions. Pt belongings returned and is ambulatory out of ED.  

## 2019-08-31 NOTE — ED Triage Notes (Signed)
Per EMS: [place]pped and fell off the curb. Patient reports left hand pain and bilateral knee pain. No LOC. No bloodthinners. Patient has no reported neuro deficits and lives by herself.

## 2019-08-31 NOTE — Discharge Instructions (Addendum)
Rest.  Ice affected areas for 20 minutes every 2 hours while awake for the next 2 days.  Ibuprofen 600 mg every 6 hours as needed for pain.  Take hydrocodone as prescribed as needed for pain not relieved with ibuprofen.  Follow-up with your primary doctor if symptoms are not improving in the next week.

## 2019-09-05 ENCOUNTER — Telehealth: Payer: Self-pay

## 2019-09-05 ENCOUNTER — Other Ambulatory Visit: Payer: Self-pay | Admitting: Family

## 2019-09-05 DIAGNOSIS — F4323 Adjustment disorder with mixed anxiety and depressed mood: Secondary | ICD-10-CM

## 2019-09-05 MED ORDER — ALPRAZOLAM 0.25 MG PO TABS: ORAL_TABLET | ORAL | 1 refills | Status: DC

## 2019-09-05 NOTE — Telephone Encounter (Signed)
Medication Refill - Medication: ALPRAZolam (XANAX) 0.25 MG tablet    Has the patient contacted their pharmacy? Yes.   (Agent: If no, request that the patient contact the pharmacy for the refill.) (Agent: If yes, when and what did the pharmacy advise?)  Preferred Pharmacy (with phone number or street name): [id number] - [place], [place].  Agent: Please be advised that RX refills may take up to 3 business days. We ask that you follow-up with your pharmacy.

## 2019-09-11 NOTE — Progress Notes (Signed)
Subjective:    Patient ID: Courtney Brown, female    DOB: 24-Oct-1940, 79 y.o.   MRN: [id number]  HPI The patient is here for follow up from the ED.   ED 2/3: she went to the ED after a fall.  [place] curb and fell forward.  She hit her face, left hand and bilateral knees on the ground.  She denies LOC.  She complained at that time of a headache, neck pain, b/l knee pain, left shoulder pain and left hand pain.  She denied chest pain.  She had not tried anything for the pain.    In ED had xrays of left shoulder, right knee, left hand, Ct head, face and c-spine done.   She received vicodin in the ED.  She had abrasions on her face.  Right knee had moderate effusion and abrasions, left knee had abrasion, left hand had ecchymosis.  Rest of her exam and joint exams were normal.   All imaging was unremarkable.  She was discharged home with a prescription for Vicodin and advised to follow-up if her pain did not improve.   She still has a lot of pain in her right knee.  There is still significant bruising. She has decreased ROM.  Her left knee is still hurting.  Her left cheek bone is a little tender.  She was mostly concerned because of the extensive bruising in her right leg and knee that is still present.  She has only taken a few of the Vicodin and at this point is only taking Tylenol as needed.      Medications and allergies reviewed with patient and updated if appropriate.  Patient Active Problem List   Diagnosis Date Noted  . Preoperative clearance 08/19/2019  . Tobacco abuse 08/19/2019  . Encounter for general adult medical examination with abnormal findings 06/03/2018  . Chronic respiratory failure with hypercapnia ([place]) 01/19/2018  . Chronic bilateral low back pain with right-sided sciatica 09/01/2017  . Colon polyps 08/08/2016  . PVD (peripheral vascular disease) ([place]) 08/08/2016  . COPD GOLD II 07/29/2016  . HTN (hypertension),  benign 07/29/2016  . Hyperlipidemia 07/29/2016  . CAD (coronary artery disease) 07/29/2016  . Post herpetic neuralgia 07/29/2016  . Adjustment disorder with mixed anxiety and depressed mood 07/29/2016  . Osteoporosis 07/29/2016  . Gastroesophageal reflux disease without esophagitis 07/29/2016  . Hearing loss 07/29/2016    Current Outpatient Medications on File Prior to Visit  Medication Sig Dispense Refill  . acetaminophen (TYLENOL) 500 MG tablet Take 500 mg by mouth every 6 (six) hours as needed.    Marland Kitchen albuterol (PROAIR HFA) 108 (90 Base) MCG/ACT inhaler Inhale 1-2 puffs into the lungs every 6 (six) hours as needed for wheezing or shortness of breath. 6.7 g 6  . ALPRAZolam (XANAX) 0.25 MG tablet TAKE 1 TABLET BY MOUTH AT BEDTIME AS NEEDED FOR ANXIETY 30 tablet 1  . amLODipine (NORVASC) 10 MG tablet Take 1 tablet (10 mg total) by mouth daily. 90 tablet 3  . aspirin 81 MG tablet Take 81 mg by mouth daily.    . Calcium Citrate-Vitamin D (CITRACAL + D PO) Take by mouth. Take twice a day as directed    . citalopram (CELEXA) 20 MG tablet Take 1 tablet (20 mg total) by mouth daily. 90 tablet 3  . colesevelam (WELCHOL) 625 MG tablet TAKE 1 TABLET BY MOUTH 2 TIMES DAILY WITH A MEAL 180 tablet 3  .  fenofibrate (TRICOR) 145 MG tablet Take 1 tablet (145 mg total) by mouth daily. 90 tablet 3  . furosemide (LASIX) 20 MG tablet TAKE 1 TABLET BY MOUTH DAILY. FOR LEG EDEMA 90 tablet 1  . gabapentin (NEURONTIN) 100 MG capsule TAKE 1 CAPSULE BY MOUTH THREE TIMES A DAY 270 capsule 3  . Garlic Oil 123XX123 MG CAPS Take 1,000 mg by mouth 1 day or 1 dose.    Marland Kitchen HYDROcodone-acetaminophen (NORCO) 5-325 MG tablet Take 1-2 tablets by mouth every 6 (six) hours as needed. 12 tablet 0  . Lactobacillus (PROBIOTIC ACIDOPHILUS) CAPS Take by mouth once.    . loratadine (CLARITIN) 10 MG tablet Take 1 tablet (10 mg total) by mouth daily. 90 tablet 3  . magnesium oxide (MAG-OX) 400 MG tablet TAKE 1 TABLET BY MOUTH TWICE A DAY 180  tablet 3  . metoprolol succinate (TOPROL-XL) 50 MG 24 hr tablet Take 1 tablet (50 mg total) by mouth 2 (two) times daily. 180 tablet 3  . mometasone (NASONEX) 50 MCG/ACT nasal spray Place 2 sprays into the nose daily. 51 g 3  . nystatin ointment (MYCOSTATIN) Apply 1 application topically 2 (two) times daily. 30 g 0  . omeprazole (PRILOSEC) 20 MG capsule Take 1 capsule (20 mg total) by mouth daily. 90 capsule 3  . raloxifene (EVISTA) 60 MG tablet Take 1 tablet (60 mg total) by mouth daily. 90 tablet 3  . SPIRIVA RESPIMAT 2.5 MCG/ACT AERS 2 puff per day 12 g 3  . SYMBICORT 160-4.5 MCG/ACT inhaler 2 puffs bid 30.6 Inhaler 3  . triamcinolone cream (KENALOG) 0.1 % Apply 1 application topically 2 (two) times daily. 30 g 0  . lovastatin (MEVACOR) 40 MG tablet Take 1 tablet (40 mg total) by mouth at bedtime. (Patient not taking: Reported on 09/12/2019) 90 tablet 3   No current facility-administered medications on file prior to visit.    Past Medical History:  Diagnosis Date  . Allergy   . Anxiety   . Colon polyps   . COPD (chronic obstructive pulmonary disease) ([place])   . Emphysema of lung ([place])   . GERD (gastroesophageal reflux disease)   . H/O hiatal hernia   . Hyperlipidemia   . Hypertension   . Inverted nipple    bilateral, pt says that's normal  . Myocardial infarct, old 2008   no stent placed  . Osteoporosis   . Stroke [facility])    TIA    Past Surgical History:  Procedure Laterality Date  . ABDOMINAL HYSTERECTOMY  1998  . 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 HERNIA REPAIR     umbilical, ventral  . INSERTION OF ILIAC STENT  2008    Social History   Socioeconomic History  . Marital status: Widowed    Spouse name: Not on file  . Number of children: Not on file  . Years of education: Not on file  . Highest education level: Not on file  Occupational History  . Not on file  Tobacco Use  . Smoking status: Former Smoker    Packs/day: 1.50    Years: 44.00     Pack years: 66.00    Types: Cigarettes    Quit date: 07/28/2005    Years since quitting: 14.1  . Smokeless tobacco: Never Used  Substance and Sexual Activity  . Alcohol use: Yes    Alcohol/week: 2.0 standard drinks    Types: 2 Shots of liquor per week    Comment: social, weekly  .  Drug use: No  . Sexual activity: Not Currently  Other Topics Concern  . Not on file  Social History Narrative  . Not on file   Social Determinants of Health   Financial Resource Strain:   . Difficulty of Paying Living Expenses: Not on file  Food Insecurity:   . Worried About Charity fundraiser in the Last Year: Not on file  . Ran Out of Food in the Last Year: Not on file  Transportation Needs:   . Lack of Transportation (Medical): Not on file  . Lack of Transportation (Non-Medical): Not on file  Physical Activity:   . Days of Exercise per Week: Not on file  . Minutes of Exercise per Session: Not on file  Stress:   . Feeling of Stress : Not on file  Social Connections:   . Frequency of Communication with Friends and Family: Not on file  . Frequency of Social Gatherings with Friends and Family: Not on file  . Attends Religious Services: Not on file  . Active Member of Clubs or Organizations: Not on file  . Attends Archivist Meetings: Not on file  . Marital Status: Not on file    Family History  Problem Relation Age of Onset  . Hearing loss Father   . Hearing loss Maternal Aunt   . Hearing loss Maternal Grandmother   . Cancer Maternal Grandmother        colon  . Arthritis Mother   . Cancer Paternal Grandmother        colon  . Breast cancer Neg Hx     Review of Systems  Constitutional: Negative for chills and fever.  Eyes: Negative for visual disturbance.  Respiratory: Negative for cough, shortness of breath and wheezing.   Cardiovascular: Negative for chest pain.  Gastrointestinal: Negative for nausea.  Musculoskeletal: Positive for arthralgias.  Skin: Positive for color  change and wound.  Neurological: Negative for dizziness, light-headedness and headaches.       Objective:   Vitals:   09/12/19 1458  BP: 140/62  Pulse: 60  Resp: 16  Temp: 98 F (36.7 C)  SpO2: 95%   BP Readings from Last 3 Encounters:  09/12/19 140/62  08/31/19 130/60  08/19/19 (!) 131/59   Wt Readings from Last 3 Encounters:  09/12/19 162 lb 3.2 oz (73.6 kg)  08/19/19 164 lb 6.4 oz (74.6 kg)  07/04/19 160 lb 9.6 oz (72.8 kg)   Body mass index is 31.68 kg/m.   Physical Exam Constitutional:      Appearance: Normal appearance.  HENT:     Head: Normocephalic.     Comments: Bruising left cheek to left side of chin - nontender to palpation, no swelling.  No deformity Musculoskeletal:     Comments: Left knee with healing abrasion on anterior patella, mild surrounding erythema, mild effusion and tenderness with palpation.  Normal range of motion. Right knee with moderate effusion with a purple discoloration-probable hematoma.  Tenderness with palpation throughout knee that is mild.  Bruising from just above the knee to foot is mildly tender to palpation.  Mild swelling right lower leg  Skin:    General: Skin is warm and dry.     Findings: Bruising (Bruising left hand and wrist is nontender.  Bruising near left knee and extensive bruising right leg and knee) present.  Neurological:     Mental Status: She is alert.     Sensory: No sensory deficit.  Assessment & Plan:    See Problem List for Assessment and Plan of chronic medical problems.    This visit occurred during the SARS-CoV-2 public health emergency.  Safety protocols were in place, including screening questions prior to the visit, additional usage of staff PPE, and extensive cleaning of exam room while observing appropriate contact time as indicated for disinfecting solutions.

## 2019-09-12 ENCOUNTER — Ambulatory Visit (INDEPENDENT_AMBULATORY_CARE_PROVIDER_SITE_OTHER): Payer: Medicare Other | Admitting: Internal Medicine

## 2019-09-12 ENCOUNTER — Other Ambulatory Visit: Payer: Self-pay

## 2019-09-12 ENCOUNTER — Encounter: Payer: Self-pay | Admitting: Internal Medicine

## 2019-09-12 DIAGNOSIS — M25061 Hemarthrosis, right knee: Secondary | ICD-10-CM | POA: Diagnosis not present

## 2019-09-12 DIAGNOSIS — M25562 Pain in left knee: Secondary | ICD-10-CM

## 2019-09-12 DIAGNOSIS — S0081XA Abrasion of other part of head, initial encounter: Secondary | ICD-10-CM | POA: Diagnosis not present

## 2019-09-12 NOTE — Patient Instructions (Addendum)
Continue tylenol as needed for your pain.   You can apply heat to the right knee and leg   It will take another 2-4 weeks for your body to reabsorb the bruising.     If your right knee swelling does not improve we can have you see Dr Tamala Julian.    Please call if there is no improvement in your symptoms.    Hematoma A hematoma is a collection of blood under the skin, in an organ, in a body space, in a joint space, or in other tissue. The blood can thicken (clot) to form a lump that you can see and feel. The lump is often firm and may become sore and tender. Most hematomas get better in a few days to weeks. However, some hematomas may be serious and require medical care. Hematomas can range from very small to very large. What are the causes? This condition is caused by:  A blunt or penetrating injury.  A leakage from a blood vessel under the skin.  Some medical procedures, including surgeries, such as oral surgery, face lifts, and surgeries on the joints.  Some medical conditions that cause bleeding or bruising. There may be multiple hematomas that appear in different areas of the body. What increases the risk? You are more likely to develop this condition if:  You are an older adult.  You use blood thinners. What are the signs or symptoms?  Symptoms of this condition depend on where the hematoma is located.  Common symptoms of a hematoma that is under the skin include:  A firm lump on the body.  Pain and tenderness in the area.  Bruising. Blue, dark blue, purple-red, or yellowish skin (discoloration) may appear at the site of the hematoma if the hematoma is close to the surface of the skin. Common symptoms of a hematoma that is deep in the tissues or body spaces may be less obvious. They include:  A collection of blood in the stomach (intra-abdominal hematoma). This may cause pain in the abdomen, weakness, fainting, and shortness of breath.  A collection of blood in the head  (intracranial hematoma). This may cause a headache or symptoms such as weakness, trouble speaking or understanding, or a change in consciousness. How is this diagnosed? This condition is diagnosed based on:  Your medical history.  A physical exam.  Imaging tests, such as an ultrasound or CT scan. These may be needed if your health care provider suspects a hematoma in deeper tissues or body spaces.  Blood tests. These may be needed if your health care provider believes that the hematoma is caused by a medical condition. How is this treated? Treatment for this condition depends on the cause, size, and location of the hematoma. Treatment may include:  Doing nothing. The majority of hematomas do not need treatment as many of them go away on their own over time.  Surgery or close monitoring. This may be needed for large hematomas or hematomas that affect vital organs.  Medicines. Medicines may be given if there is an underlying medical cause for the hematoma. Follow these instructions at home: Managing pain, stiffness, and swelling   If directed, put ice on the affected area. ? Put ice in a plastic bag. ? Place a towel between your skin and the bag. ? Leave the ice on for 20 minutes, 2-3 times a day for the first couple of days.  If directed, apply heat to the affected area after applying ice for a couple of  days. Use the heat source that your health care provider recommends, such as a moist heat pack or a heating pad. ? Place a towel between your skin and the heat source. ? Leave the heat on for 20-30 minutes. ? Remove the heat if your skin turns bright red. This is especially important if you are unable to feel pain, heat, or cold. You may have a greater risk of getting burned.  Raise (elevate) the affected area above the level of your heart while you are sitting or lying down.  If told, wrap the affected area with an elastic bandage. The bandage applies pressure (compression) to the  area, which may help to reduce swelling and promote healing. Do not wrap the bandage too tightly around the affected area.  If your hematoma is on a leg or foot (lower extremity) and is painful, your health care provider may recommend crutches. Use them as told by your health care provider. General instructions  Take over-the-counter and prescription medicines only as told by your health care provider.  Keep all follow-up visits as told by your health care provider. This is important. Contact a health care provider if:  You have a fever.  The swelling or discoloration gets worse.  You develop more hematomas. Get help right away if:  Your pain is worse or your pain is not controlled with medicine.  Your skin over the hematoma breaks or starts bleeding.  Your hematoma is in your chest or abdomen and you have weakness, shortness of breath, or a change in consciousness.  You have a hematoma on your scalp that is caused by a fall or injury, and you also have: ? A headache that gets worse. ? Trouble speaking or understanding speech. ? Weakness. ? Change in alertness or consciousness. Summary  A hematoma is a collection of blood under the skin, in an organ, in a body space, in a joint space, or in other tissue.  This condition usually does not need treatment because many hematomas go away on their own over time.  Large hematomas, or those that may affect vital organs, may need surgical drainage or monitoring. If the hematoma is caused by a medical condition, medicines may be prescribed.  Get help right away if your hematoma breaks or starts to bleed, you have shortness of breath, or you have a headache or trouble speaking after a fall. This information is not intended to replace advice given to you by your health care provider. Make sure you discuss any questions you have with your health care provider. Document Revised: 12/08/2018 Document Reviewed: 12/17/2017 Elsevier Patient  Education  2020 [employer].

## 2019-09-12 NOTE — Assessment & Plan Note (Signed)
Secondary to fall Exam with a superficial abrasion that is healing and mild effusion Symptoms are improving Continue symptomatic treatment-can ice or apply heat Tylenol as needed

## 2019-09-12 NOTE — Assessment & Plan Note (Signed)
Left cheek Secondary to fall 2/3 Abrasion is healing, associated ecchymosis slowly improving and nontender Imaging in the ED negative for fracture No further treatment needed

## 2019-09-12 NOTE — Assessment & Plan Note (Signed)
Right knee is swollen with surrounding ecchymosis.  Moderate effusion and tenderness Discussed that the ecchymosis will take a few more weeks to resolve and that she can apply heat Continue Tylenol as needed She does have knee pain from knee effusion, which is likely a hemarthrosis X-rays done in the ED showed no fracture She is seen some improvement Discussed that I can set her up with sports medicine to have the knee drained if she wishes, but at this time she would like to give it another week-she will call with questions, concerns or if she would like to be referred

## 2019-09-13 ENCOUNTER — Other Ambulatory Visit: Payer: Self-pay | Admitting: Family

## 2019-09-13 ENCOUNTER — Telehealth: Payer: Self-pay | Admitting: Family

## 2019-09-13 NOTE — Telephone Encounter (Signed)
° ° ° ° ° ° °  1. Which medications need to be refilled? (please list name of each medication and dose if known) fenofibrate (TRICOR) 145 MG tablet  2. Which pharmacy/location (including street and city if local pharmacy) is medication to be sent to? [id number] [place], [place] - [address].  3. Do they need a 30 day or 90 day supply? [place]

## 2019-09-13 NOTE — Telephone Encounter (Signed)
My chart message sent to patient today.

## 2019-09-13 NOTE — Telephone Encounter (Signed)
Attempted to reach patient but voicemail was full and I was unable to leave a message.

## 2019-09-13 NOTE — Telephone Encounter (Signed)
The Tricor was done for a 90 day supply with 3 refills in December. She has a year's supply of this medication.  She needs to check with her pharmacy.

## 2019-09-14 NOTE — Telephone Encounter (Signed)
Last read by Jonette Mate at 6:25 PM on 09/13/2019.

## 2019-11-11 ENCOUNTER — Other Ambulatory Visit: Payer: Self-pay | Admitting: Family

## 2019-11-11 DIAGNOSIS — F4323 Adjustment disorder with mixed anxiety and depressed mood: Secondary | ICD-10-CM

## 2019-11-11 MED ORDER — ALPRAZOLAM 0.25 MG PO TABS: ORAL_TABLET | ORAL | 1 refills | Status: DC

## 2020-01-02 NOTE — Telephone Encounter (Signed)
Error

## 2020-01-08 ENCOUNTER — Other Ambulatory Visit: Payer: Self-pay | Admitting: Family

## 2020-01-08 DIAGNOSIS — F4323 Adjustment disorder with mixed anxiety and depressed mood: Secondary | ICD-10-CM

## 2020-01-09 ENCOUNTER — Telehealth: Payer: Self-pay

## 2020-01-09 NOTE — Telephone Encounter (Signed)
1.Medication Requested:ALPRAZolam (XANAX) 0.25 MG tablet  2. Pharmacy (Name, Street, City[id number] [place], [address]  3. On Med List: Yes   4. Last Visit with PCP: 2.15.21   5. Next visit date with PCP: n/a   Agent: Please be advised that RX refills may take up to 3 business days. We ask that you follow-up with your pharmacy.

## 2020-01-09 NOTE — Telephone Encounter (Signed)
LM on voicemail to inform pt that [facility] she [facility] [address] at 0950 this morning; that she should send inquiries regarding the medication to her pharmacy, but if she had any questions to call LPC at [facility] back.

## 2020-02-06 ENCOUNTER — Telehealth: Payer: Self-pay | Admitting: Family

## 2020-02-06 NOTE — Telephone Encounter (Signed)
    1.Medication Requested:fenofibrate (TRICOR) 145 MG tablet  2. Pharmacy (Name[facility] Street, City[id number] [place], [place].  3. On Med List: yes  4. Last Visit with PCP: 09/12/19  5. Next visit date with PCP: n/a   Agent: Please be advised that RX refills may take up to 3 business days. We ask that you follow-up with your pharmacy.

## 2020-02-06 NOTE — Telephone Encounter (Signed)
LVM for patient to return to clarify need for refill as it looks like the patient should still have some supply left.

## 2020-02-07 ENCOUNTER — Other Ambulatory Visit: Payer: Self-pay | Admitting: Family

## 2020-02-07 DIAGNOSIS — I739 Peripheral vascular disease, unspecified: Secondary | ICD-10-CM

## 2020-02-07 MED ORDER — FUROSEMIDE 20 MG PO TABS: ORAL_TABLET | ORAL | 0 refills | Status: DC

## 2020-02-07 NOTE — Telephone Encounter (Signed)
F/u    The patient calling back    Prior authorization on  furosemide (LASIX) 20 MG tablet  [id number] [place] [place] - [place].

## 2020-02-08 NOTE — Telephone Encounter (Signed)
Pharmacy called to inquire about insurance coverage for prescribed furosemide.  Pharmacy states that the patient's insurance will cover medication, however refill not authorized until 7/19 due to refill 11/2019.  Will notify patient.

## 2020-02-09 NOTE — Telephone Encounter (Signed)
LVM to inform patient that furosemide prescription has been sent & pharmacy states that the patient's insurance will cover medication, however refill not authorized until 7/19.  Instructions left for patient to return phone call if additional ques/concerns.

## 2020-02-10 ENCOUNTER — Ambulatory Visit (HOSPITAL_COMMUNITY): Payer: Medicare Other | Attending: Cardiovascular Disease

## 2020-02-10 ENCOUNTER — Other Ambulatory Visit: Payer: Self-pay

## 2020-02-10 DIAGNOSIS — Z01810 Encounter for preprocedural cardiovascular examination: Secondary | ICD-10-CM | POA: Insufficient documentation

## 2020-02-10 DIAGNOSIS — I10 Essential (primary) hypertension: Secondary | ICD-10-CM | POA: Diagnosis not present

## 2020-02-10 DIAGNOSIS — I2510 Atherosclerotic heart disease of native coronary artery without angina pectoris: Secondary | ICD-10-CM

## 2020-02-10 LAB — ECHOCARDIOGRAM COMPLETE
Area-P 1/2: 2.14 cm2
S' Lateral: 2.8 cm

## 2020-03-07 ENCOUNTER — Other Ambulatory Visit: Payer: Self-pay | Admitting: Family

## 2020-03-07 DIAGNOSIS — F4323 Adjustment disorder with mixed anxiety and depressed mood: Secondary | ICD-10-CM

## 2020-03-07 DIAGNOSIS — I739 Peripheral vascular disease, unspecified: Secondary | ICD-10-CM

## 2020-04-18 ENCOUNTER — Other Ambulatory Visit: Payer: Self-pay | Admitting: Family

## 2020-04-18 MED ORDER — MAGNESIUM OXIDE 400 MG PO TABS: ORAL_TABLET | ORAL | 3 refills | Status: DC

## 2020-04-23 ENCOUNTER — Telehealth: Payer: Self-pay | Admitting: Family

## 2020-04-23 MED ORDER — MAGNESIUM OXIDE 400 MG PO TABS: ORAL_TABLET | ORAL | 3 refills | Status: DC

## 2020-04-23 NOTE — Telephone Encounter (Signed)
Patient states the pharmacy did not get the following RX.  Could you please send it again.  Thank you.  magnesium oxide (MAG-OX) 400 MG tablet

## 2020-05-09 ENCOUNTER — Other Ambulatory Visit: Payer: Self-pay | Admitting: Family

## 2020-05-09 DIAGNOSIS — F4323 Adjustment disorder with mixed anxiety and depressed mood: Secondary | ICD-10-CM

## 2020-05-09 DIAGNOSIS — I739 Peripheral vascular disease, unspecified: Secondary | ICD-10-CM

## 2020-05-10 ENCOUNTER — Telehealth: Payer: Self-pay | Admitting: Family

## 2020-05-10 NOTE — Telephone Encounter (Signed)
error 

## 2020-05-11 ENCOUNTER — Other Ambulatory Visit: Payer: Self-pay | Admitting: Family

## 2020-05-11 DIAGNOSIS — E782 Mixed hyperlipidemia: Secondary | ICD-10-CM

## 2020-05-11 MED ORDER — FENOFIBRATE 145 MG PO TABS: 145.0000 mg | ORAL_TABLET | Freq: Every day | ORAL | 0 refills | Status: DC

## 2020-05-15 ENCOUNTER — Other Ambulatory Visit: Payer: Self-pay | Admitting: Family

## 2020-05-15 MED ORDER — MAGNESIUM OXIDE 400 MG PO TABS: ORAL_TABLET | ORAL | 3 refills | Status: DC

## 2020-06-15 ENCOUNTER — Other Ambulatory Visit: Payer: Self-pay | Admitting: Family

## 2020-06-15 MED ORDER — MAGNESIUM OXIDE 400 MG PO TABS: ORAL_TABLET | ORAL | 2 refills | Status: DC

## 2020-06-29 ENCOUNTER — Other Ambulatory Visit: Payer: Self-pay | Admitting: Family

## 2020-07-09 ENCOUNTER — Telehealth: Payer: Self-pay | Admitting: Family

## 2020-07-09 NOTE — Telephone Encounter (Signed)
    1.Medication Requested:ALPRAZolam (XANAX) 0.25 MG tablet  2. Pharmacy (Name[facility] Street, City[id number] [place], [place].  3. On Med List: yes  4. Last Visit with PCP: 07/04/2019  5. Next visit date with PCP: patient to call back for appointment   Agent: Please be advised that RX refills may take up to 3 business days. We ask that you follow-up with your pharmacy.

## 2020-07-09 NOTE — Telephone Encounter (Signed)
Will discuss at [place] on 07/10/20;

## 2020-07-10 ENCOUNTER — Encounter: Payer: Self-pay | Admitting: Family

## 2020-07-10 ENCOUNTER — Other Ambulatory Visit: Payer: Self-pay

## 2020-07-10 ENCOUNTER — Ambulatory Visit (INDEPENDENT_AMBULATORY_CARE_PROVIDER_SITE_OTHER): Payer: Medicare Other | Admitting: Family

## 2020-07-10 VITALS — BP 136/64 | HR 67 | Temp 97.7°F | Ht 60.0 in | Wt 164.0 lb

## 2020-07-10 DIAGNOSIS — I10 Essential (primary) hypertension: Secondary | ICD-10-CM | POA: Diagnosis not present

## 2020-07-10 DIAGNOSIS — M810 Age-related osteoporosis without current pathological fracture: Secondary | ICD-10-CM | POA: Diagnosis not present

## 2020-07-10 DIAGNOSIS — F4323 Adjustment disorder with mixed anxiety and depressed mood: Secondary | ICD-10-CM | POA: Diagnosis not present

## 2020-07-10 DIAGNOSIS — I739 Peripheral vascular disease, unspecified: Secondary | ICD-10-CM

## 2020-07-10 DIAGNOSIS — J449 Chronic obstructive pulmonary disease, unspecified: Secondary | ICD-10-CM

## 2020-07-10 DIAGNOSIS — Z23 Encounter for immunization: Secondary | ICD-10-CM | POA: Diagnosis not present

## 2020-07-10 DIAGNOSIS — Z0000 Encounter for general adult medical examination without abnormal findings: Secondary | ICD-10-CM | POA: Diagnosis not present

## 2020-07-10 DIAGNOSIS — K219 Gastro-esophageal reflux disease without esophagitis: Secondary | ICD-10-CM

## 2020-07-10 DIAGNOSIS — Z789 Other specified health status: Secondary | ICD-10-CM

## 2020-07-10 DIAGNOSIS — E782 Mixed hyperlipidemia: Secondary | ICD-10-CM

## 2020-07-10 DIAGNOSIS — B0229 Other postherpetic nervous system involvement: Secondary | ICD-10-CM

## 2020-07-10 LAB — COMPREHENSIVE METABOLIC PANEL
ALT: 13 U/L (ref 0–35)
AST: 17 U/L (ref 0–37)
Albumin: 3.3 g/dL — ABNORMAL LOW (ref 3.5–5.2)
Alkaline Phosphatase: 39 U/L (ref 39–117)
BUN: 26 mg/dL — ABNORMAL HIGH (ref 6–23)
CO2: 31 mEq/L (ref 19–32)
Calcium: 9 mg/dL (ref 8.4–10.5)
Chloride: 101 mEq/L (ref 96–112)
Creatinine, Ser: 1.19 mg/dL (ref 0.40–1.20)
GFR: 43.67 mL/min — ABNORMAL LOW (ref >60.00)
Glucose, Bld: 123 mg/dL — ABNORMAL HIGH (ref 70–99)
Potassium: 3.7 mEq/L (ref 3.5–5.1)
Sodium: 139 mEq/L (ref 135–145)
Total Bilirubin: 0.4 mg/dL (ref 0.2–1.2)
Total Protein: 6.1 g/dL (ref 6.0–8.3)

## 2020-07-10 LAB — MAGNESIUM: Magnesium: 1.7 mg/dL (ref 1.5–2.5)

## 2020-07-10 LAB — VITAMIN D 25 HYDROXY (VIT D DEFICIENCY, FRACTURES): VITD: 38.74 ng/mL (ref 30.00–100.00)

## 2020-07-10 LAB — LIPID PANEL
Cholesterol: 148 mg/dL (ref 0–200)
HDL: 46.8 mg/dL (ref >39.00)
LDL Cholesterol: 74 mg/dL (ref 0–99)
NonHDL: 101.3
Total CHOL/HDL Ratio: 3
Triglycerides: 135 mg/dL (ref 0.0–149.0)
VLDL: 27 mg/dL (ref 0.0–40.0)

## 2020-07-10 LAB — CBC WITH DIFFERENTIAL/PLATELET
Basophils Absolute: 0 10*3/uL (ref 0.0–0.1)
Basophils Relative: 0.4 % (ref 0.0–3.0)
Eosinophils Absolute: 0.2 10*3/uL (ref 0.0–0.7)
Eosinophils Relative: 2.6 % (ref 0.0–5.0)
HCT: 41.6 % (ref 36.0–46.0)
Hemoglobin: 13.7 g/dL (ref 12.0–15.0)
Lymphocytes Relative: 19.9 % (ref 12.0–46.0)
Lymphs Abs: 1.6 10*3/uL (ref 0.7–4.0)
MCHC: 33 g/dL (ref 30.0–36.0)
MCV: 90.4 fl (ref 78.0–100.0)
Monocytes Absolute: 0.8 10*3/uL (ref 0.1–1.0)
Monocytes Relative: 9.5 % (ref 3.0–12.0)
Neutro Abs: 5.4 10*3/uL (ref 1.4–7.7)
Neutrophils Relative %: 67.6 % (ref 43.0–77.0)
Platelets: 286 10*3/uL (ref 150.0–400.0)
RBC: 4.6 Mil/uL (ref 3.87–5.11)
RDW: 14.8 % (ref 11.5–15.5)
WBC: 8 10*3/uL (ref 4.0–10.5)

## 2020-07-10 MED ORDER — METOPROLOL SUCCINATE ER 50 MG PO TB24: 50.0000 mg | ORAL_TABLET | Freq: Two times a day (BID) | ORAL | 3 refills | Status: DC

## 2020-07-10 MED ORDER — CITALOPRAM HYDROBROMIDE 20 MG PO TABS: 20.0000 mg | ORAL_TABLET | Freq: Every day | ORAL | 3 refills | Status: DC

## 2020-07-10 MED ORDER — ALPRAZOLAM 0.25 MG PO TABS: 0.2500 mg | ORAL_TABLET | Freq: Two times a day (BID) | ORAL | 1 refills | Status: DC | PRN

## 2020-07-10 MED ORDER — SYMBICORT 160-4.5 MCG/ACT IN AERO: INHALATION_SPRAY | RESPIRATORY_TRACT | 3 refills | Status: DC

## 2020-07-10 MED ORDER — COLESEVELAM HCL 625 MG PO TABS: ORAL_TABLET | ORAL | 3 refills | Status: DC

## 2020-07-10 MED ORDER — RALOXIFENE HCL 60 MG PO TABS: 60.0000 mg | ORAL_TABLET | Freq: Every day | ORAL | 3 refills | Status: DC

## 2020-07-10 MED ORDER — GABAPENTIN 100 MG PO CAPS: ORAL_CAPSULE | ORAL | 3 refills | Status: DC

## 2020-07-10 MED ORDER — OMEPRAZOLE 20 MG PO CPDR: 20.0000 mg | DELAYED_RELEASE_CAPSULE | Freq: Every day | ORAL | 3 refills | Status: DC

## 2020-07-10 MED ORDER — AMLODIPINE BESYLATE 10 MG PO TABS: 10.0000 mg | ORAL_TABLET | Freq: Every day | ORAL | 3 refills | Status: DC

## 2020-07-10 MED ORDER — ALBUTEROL SULFATE HFA 108 (90 BASE) MCG/ACT IN AERS: 1.0000 | INHALATION_SPRAY | Freq: Four times a day (QID) | RESPIRATORY_TRACT | 6 refills | Status: DC | PRN

## 2020-07-10 MED ORDER — MOMETASONE FUROATE 50 MCG/ACT NA SUSP: 2.0000 | Freq: Every day | NASAL | 3 refills | Status: DC

## 2020-07-10 MED ORDER — SPIRIVA RESPIMAT 2.5 MCG/ACT IN AERS: INHALATION_SPRAY | RESPIRATORY_TRACT | 3 refills | Status: DC

## 2020-07-10 MED ORDER — FUROSEMIDE 20 MG PO TABS: ORAL_TABLET | ORAL | 3 refills | Status: DC

## 2020-07-10 MED ORDER — FENOFIBRATE 145 MG PO TABS: 145.0000 mg | ORAL_TABLET | Freq: Every day | ORAL | 3 refills | Status: DC

## 2020-07-10 NOTE — Progress Notes (Addendum)
Courtney Brown is a 79 y.o. female with the following history as recorded in EpicCare:  Patient Active Problem List   Diagnosis Date Noted   Hemarthrosis of right knee 09/12/2019   Left knee pain 09/12/2019   Facial abrasion, initial encounter 09/12/2019   Preoperative clearance 08/19/2019   Tobacco abuse 08/19/2019   Encounter for general adult medical examination with abnormal findings 06/03/2018   Chronic respiratory failure with hypercapnia ([place]) 01/19/2018   Chronic bilateral low back pain with right-sided sciatica 09/01/2017   Colon polyps 08/08/2016   PVD (peripheral vascular disease) ([place]) 08/08/2016   COPD GOLD II 07/29/2016   HTN (hypertension), benign 07/29/2016   Hyperlipidemia 07/29/2016   CAD (coronary artery disease) 07/29/2016   Post herpetic neuralgia 07/29/2016   Adjustment disorder with mixed anxiety and depressed mood 07/29/2016   Osteoporosis 07/29/2016   Gastroesophageal reflux disease without esophagitis 07/29/2016   Hearing loss 07/29/2016    Current Outpatient Medications  Medication Sig Dispense Refill   acetaminophen (TYLENOL) 500 MG tablet Take 500 mg by mouth every 6 (six) hours as needed.     aspirin 81 MG tablet Take 81 mg by mouth daily.     Calcium Citrate-Vitamin D (CITRACAL + D PO) Take by mouth. Take twice a day as directed     Garlic Oil 1610 MG CAPS Take 1,000 mg by mouth 1 day or 1 dose.     Lactobacillus (PROBIOTIC ACIDOPHILUS) CAPS Take by mouth once.     loratadine (CLARITIN) 10 MG tablet TAKE 1 TABLET BY MOUTH EVERY DAY 90 tablet 3   magnesium oxide (MAG-OX) 400 MG tablet TAKE 1 TABLET BY MOUTH TWICE A DAY 180 tablet 2   nystatin ointment (MYCOSTATIN) Apply 1 application topically 2 (two) times daily. 30 g 0   triamcinolone cream (KENALOG) 0.1 % Apply 1 application topically 2 (two) times daily. 30 g 0   albuterol (PROAIR HFA) 108 (90 Base) MCG/ACT inhaler Inhale 1-2 puffs into the lungs every 6 (six) hours as  needed for wheezing or shortness of breath. 6.7 g 6   ALPRAZolam (XANAX) 0.25 MG tablet Take 1 tablet (0.25 mg total) by mouth 2 (two) times daily as needed for anxiety. 30 tablet 1   amLODipine (NORVASC) 10 MG tablet Take 1 tablet (10 mg total) by mouth daily. 90 tablet 3   citalopram (CELEXA) 20 MG tablet Take 1 tablet (20 mg total) by mouth daily. 90 tablet 3   colesevelam (WELCHOL) 625 MG tablet TAKE 1 TABLET BY MOUTH 2 TIMES DAILY WITH A MEAL 180 tablet 3   fenofibrate (TRICOR) 145 MG tablet Take 1 tablet (145 mg total) by mouth daily. 90 tablet 3   furosemide (LASIX) 20 MG tablet Take 1 tablet daily as directed; may take 2nd tablet as needed for swelling 180 tablet 3   gabapentin (NEURONTIN) 100 MG capsule TAKE 1 CAPSULE BY MOUTH THREE TIMES A DAY 270 capsule 3   lovastatin (MEVACOR) 40 MG tablet Take 1 tablet (40 mg total) by mouth at bedtime. (Patient not taking: Reported on 07/10/2020) 90 tablet 3   metoprolol succinate (TOPROL-XL) 50 MG 24 hr tablet Take 1 tablet (50 mg total) by mouth 2 (two) times daily. 180 tablet 3   mometasone (NASONEX) 50 MCG/ACT nasal spray Place 2 sprays into the nose daily. 51 g 3   omeprazole (PRILOSEC) 20 MG capsule Take 1 capsule (20 mg total) by mouth daily. 90 capsule 3   raloxifene (EVISTA) 60 MG tablet Take 1 tablet (  60 mg total) by mouth daily. 90 tablet 3   SPIRIVA RESPIMAT 2.5 MCG/ACT AERS 2 puff per day 12 g 3   SYMBICORT 160-4.5 MCG/ACT inhaler 2 puffs bid 30.6 each 3   No current facility-administered medications for this visit.    Allergies: Prednisone and Statins  Past Medical History:  Diagnosis Date   Allergy    Anxiety    Colon polyps    COPD (chronic obstructive pulmonary disease) (HCC)    Emphysema of lung (HCC)    GERD (gastroesophageal reflux disease)    H/O hiatal hernia    Hyperlipidemia    Hypertension    Inverted nipple    bilateral, pt says that's normal   Myocardial infarct, old 2008   no stent  placed   Osteoporosis    Stroke ([place])    TIA    Past Surgical History:  Procedure Laterality Date   ABDOMINAL HYSTERECTOMY  1998   CHOLECYSTECTOMY  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   HERNIA REPAIR     umbilical, ventral   INSERTION OF ILIAC STENT  2008    Family History  Problem Relation Age of Onset   Hearing loss Father    Hearing loss Maternal Aunt    Hearing loss Maternal Grandmother    Cancer Maternal Grandmother        colon   Arthritis Mother    Cancer Paternal Grandmother        colon   Breast cancer Neg Hx     Social History   Tobacco Use   Smoking status: Former Smoker    Packs/day: 1.50    Years: 44.00    Pack years: 66.00    Types: Cigarettes    Quit date: 07/28/2005    Years since quitting: 14.9   Smokeless tobacco: Never Used  Substance Use Topics   Alcohol use: Yes    Alcohol/week: 2.0 standard drinks    Types: 2 Shots of liquor per week    Comment: social, weekly    Subjective:  Presents for yearly CPE; needs refills today; Has had a difficult past year- lost her mother (2 years old) in May, daughter in law in August, another daughter in Sports coach in November; Was planning to have hernia surgery in February and fell on the day she was scheduled for the cardiac pre-op appointment; echo was done in July 2021 and will be meeting with surgeon at end of January 2022 to discuss hernia surgery; wants to wait on colonoscopy until surgery is done;  She stopped her statin a number of months ago due to concerns for body aches- notes she does feel better off the medication; prefers not to take unless she really has to.   Review of Systems  Constitutional: Negative.   HENT: Negative.   Eyes: Negative.   Respiratory: Negative.   Cardiovascular: Negative.   Gastrointestinal: Negative.   Genitourinary: Negative.   Skin: Negative.   Neurological: Negative.   Endo/Heme/Allergies: Negative.   Psychiatric/Behavioral: Positive for depression.        Situational due to multiple deaths in the family in 2021;      Objective:  Vitals:   07/10/20 0957  BP: 136/64  Pulse: 67  Temp: 97.7 F (36.5 C)  TempSrc: Oral  SpO2: 94%  Weight: 164 lb (74.4 kg)  Height: 5' (1.524 m)    General: Well developed, well nourished, in no acute distress  Skin : Warm and dry.  Head: Normocephalic and atraumatic  Eyes: Sclera  and conjunctiva clear; pupils round and reactive to light; extraocular movements intact  Ears: External normal; canals clear; tympanic membranes normal  Oropharynx: Pink, supple. No suspicious lesions  Neck: Supple without thyromegaly, adenopathy  Lungs: Respirations unlabored; clear to auscultation bilaterally without wheeze, rales, rhonchi  CVS exam: normal rate and regular rhythm.  Abdomen: Soft; nontender; nondistended; normoactive bowel sounds; no masses or hepatosplenomegaly  Musculoskeletal: No deformities; no active joint inflammation  Extremities: No edema, cyanosis, clubbing  Vessels: Symmetric bilaterally  Neurologic: Alert and oriented; speech intact; face symmetrical; moves all extremities well; CNII-XII intact without focal deficit   Assessment:  1. PE (physical exam), annual   2. Chronic obstructive pulmonary disease, unspecified COPD type ([place])   3. Adjustment disorder with mixed anxiety and depressed mood   4. HTN (hypertension), benign   5. Mixed hyperlipidemia   6. PVD (peripheral vascular disease) ([place])   7. Post herpetic neuralgia   8. Gastroesophageal reflux disease without esophagitis   9. Age related osteoporosis, unspecified pathological fracture presence   10. Hypomagnesemia   11. Needs flu shot   12. Statin intolerance     Plan:  Age appropriate preventive healthcare needs addressed; encouraged regular eye doctor and dental exams; encouraged regular exercise; will update labs and refills as needed today; follow-up to be determined; Patient defers COVID vaccines; agrees to flu shot today;    This visit occurred during the SARS-CoV-2 public health emergency.  Safety protocols were in place, including screening questions prior to the visit, additional usage of staff PPE, and extensive cleaning of exam room while observing appropriate contact time as indicated for disinfecting solutions.     No follow-ups on file.  Orders Placed This Encounter  Procedures   Flu Vaccine QUAD High Dose(Fluad)   CBC with Differential/Platelet    Standing Status:   Future    Number of Occurrences:   1    Standing Expiration Date:   07/10/2021   Comp Met (CMET)    Standing Status:   Future    Number of Occurrences:   1    Standing Expiration Date:   07/10/2021   Lipid panel    Standing Status:   Future    Number of Occurrences:   1    Standing Expiration Date:   07/10/2021   Vitamin D (25 hydroxy)    Standing Status:   Future    Number of Occurrences:   1    Standing Expiration Date:   07/10/2021   Magnesium    Standing Status:   Future    Number of Occurrences:   1    Standing Expiration Date:   07/10/2021    Requested Prescriptions   Signed Prescriptions Disp Refills   albuterol (PROAIR HFA) 108 (90 Base) MCG/ACT inhaler 6.7 g 6    Sig: Inhale 1-2 puffs into the lungs every 6 (six) hours as needed for wheezing or shortness of breath.   ALPRAZolam (XANAX) 0.25 MG tablet 30 tablet 1    Sig: Take 1 tablet (0.25 mg total) by mouth 2 (two) times daily as needed for anxiety.   amLODipine (NORVASC) 10 MG tablet 90 tablet 3    Sig: Take 1 tablet (10 mg total) by mouth daily.   citalopram (CELEXA) 20 MG tablet 90 tablet 3    Sig: Take 1 tablet (20 mg total) by mouth daily.   colesevelam (WELCHOL) 625 MG tablet 180 tablet 3    Sig: TAKE 1 TABLET BY MOUTH 2 TIMES DAILY WITH  A MEAL   fenofibrate (TRICOR) 145 MG tablet 90 tablet 3    Sig: Take 1 tablet (145 mg total) by mouth daily.   furosemide (LASIX) 20 MG tablet 180 tablet 3    Sig: Take 1 tablet daily as directed; may take  2nd tablet as needed for swelling   gabapentin (NEURONTIN) 100 MG capsule 270 capsule 3    Sig: TAKE 1 CAPSULE BY MOUTH THREE TIMES A DAY   metoprolol succinate (TOPROL-XL) 50 MG 24 hr tablet 180 tablet 3    Sig: Take 1 tablet (50 mg total) by mouth 2 (two) times daily.   mometasone (NASONEX) 50 MCG/ACT nasal spray 51 g 3    Sig: Place 2 sprays into the nose daily.   omeprazole (PRILOSEC) 20 MG capsule 90 capsule 3    Sig: Take 1 capsule (20 mg total) by mouth daily.   raloxifene (EVISTA) 60 MG tablet 90 tablet 3    Sig: Take 1 tablet (60 mg total) by mouth daily.   SPIRIVA RESPIMAT 2.5 MCG/ACT AERS 12 g 3    Sig: 2 puff per day   SYMBICORT 160-4.5 MCG/ACT inhaler 30.6 each 3    Sig: 2 puffs bid

## 2020-07-10 NOTE — Addendum Note (Signed)
Addended by: Sherlene Shams on: 07/10/2020 01:35 PM   Modules accepted: Orders

## 2020-07-11 ENCOUNTER — Other Ambulatory Visit: Payer: Self-pay | Admitting: Family

## 2020-07-11 DIAGNOSIS — Z1231 Encounter for screening mammogram for malignant neoplasm of breast: Secondary | ICD-10-CM

## 2020-07-26 ENCOUNTER — Other Ambulatory Visit: Payer: Self-pay | Admitting: Family

## 2020-07-26 DIAGNOSIS — I739 Peripheral vascular disease, unspecified: Secondary | ICD-10-CM

## 2020-07-26 NOTE — Telephone Encounter (Signed)
1.Medication Requested: magnesium oxide (MAG-OX) 400 MG tablet    2. Pharmacy (Name, Street, [place]): [id number] - [place], [place] - [address]  3. On Med List: yes   4. Last Visit with PCP: 12.14.21  5. Next visit date with PCP: n/a   Agent: Please be advised that RX refills may take up to 3 business days. We ask that you follow-up with your pharmacy.

## 2020-07-30 ENCOUNTER — Other Ambulatory Visit: Payer: Self-pay | Admitting: Family

## 2020-07-30 MED ORDER — MAGNESIUM OXIDE 400 MG PO TABS: ORAL_TABLET | ORAL | 2 refills | Status: DC

## 2020-08-23 ENCOUNTER — Ambulatory Visit: Payer: Self-pay | Admitting: General Surgery

## 2020-08-23 ENCOUNTER — Telehealth: Payer: Self-pay

## 2020-08-23 DIAGNOSIS — K432 Incisional hernia without obstruction or gangrene: Secondary | ICD-10-CM | POA: Diagnosis not present

## 2020-08-23 NOTE — Telephone Encounter (Signed)
   [facility] Pre-operative Risk Assessment    HEARTCARE STAFF: - Please ensure there is not already an duplicate clearance open for this procedure. - Under Visit Info/Reason for Call, type in Other and utilize the format Clearance MM/DD/YY or Clearance TBD. Do not use dashes or single digits. - If request is for dental extraction, please clarify the # of teeth to be extracted.  Request for surgical clearance:  1. What type of surgery is being performed? Laparoscopic Incisional Hernia Repair   2. When is this surgery scheduled? TBD   3. What type of clearance is required (medical clearance vs. Pharmacy clearance to hold med vs. Both)? BOTH  4. Are there any medications that need to be held prior to surgery and how long? Aspirin    5. Practice name and name of physician performing surgery? [facility]- Dr.Kinsinger    6. What is the office phone number? [phone]   7.   What is the office fax number? [phone] (ATTN: Malachi Bonds, CMA)  8.   Anesthesia type (None, local, MAC, general) ? Unknown   Ena Dawley 08/23/2020, 5:21 PM  _________________________________________________________________   (provider comments below)

## 2020-08-24 NOTE — Telephone Encounter (Signed)
   Primary Cardiologist: Dr. Gwenlyn Found  Chart reviewed as part of pre-operative protocol coverage. Because of Courtney Brown's past medical history and time since last visit, she will require a follow-up visit in order to better assess preoperative cardiovascular risk.  Pre-op covering staff: - Please schedule appointment and call patient to inform them. If patient already had an upcoming appointment within acceptable timeframe, please add "pre-op clearance" to the appointment notes so provider is aware. - Please contact requesting surgeon's office via preferred method (i.e, phone, fax) to inform them of need for appointment prior to surgery.  If applicable, this message will also be routed to pharmacy pool and/or primary cardiologist for input on holding anticoagulant/antiplatelet agent as requested below so that this information is available to the clearing provider at time of patient's appointment.   I will forward to Dr. Gwenlyn Found to review as well.  Patient was previously seen for preoperative clearance by Dr. Gwenlyn Found on 08/19/2019 for hernia repair.  She has a 90-pack-year history of tobacco abuse quit around 2009.  She has a history of COPD, CAD, hypertension and hyperlipidemia.  Although note also mentions she has a history of cardiac catheterization in 2008 in [place] that revealed no evidence of significant CAD.  It does not mention what is the degree of CAD.  Dr. Gwenlyn Found did recommend a echocardiogram prior to preop clearance.  Echocardiogram was finally performed in July 2021 that showed normal EF, moderate LVH.  Given the prolonged duration since the previous visit, will need another preop clearance visit.  [place], [place]  08/24/2020, 8:40 AM

## 2020-08-24 NOTE — Telephone Encounter (Signed)
Called the requesting office and informed them that the patient is scheduled for a cardiac clearance appointment on 09/12/20 at 11:45 AM with Kerin Ransom, PA-C.

## 2020-08-24 NOTE — Telephone Encounter (Signed)
Called and left a detailed voice message for patient Courtney Brown about being scheduled for a preop clearance appointment with Kerin Ransom, PA-C on 09/12/20 at 11:45 AM and to give our office a call if this appointment date and time does not work for her to be rescheduled.

## 2020-09-06 ENCOUNTER — Telehealth: Payer: Self-pay | Admitting: Family

## 2020-09-06 NOTE — Telephone Encounter (Signed)
Last RF per controlled substance database: 08/08/20

## 2020-09-06 NOTE — Telephone Encounter (Signed)
ALPRAZolam (XANAX) 0.25 MG tablet [id number] - [place], [place] - [address] Phone:  [phone]  Fax:  [phone]     Requesting a refill Last seen-12.14.21 Next apt- n/a

## 2020-09-07 ENCOUNTER — Other Ambulatory Visit: Payer: Self-pay | Admitting: Family

## 2020-09-07 DIAGNOSIS — F4323 Adjustment disorder with mixed anxiety and depressed mood: Secondary | ICD-10-CM

## 2020-09-07 MED ORDER — ALPRAZOLAM 0.25 MG PO TABS: 0.2500 mg | ORAL_TABLET | Freq: Two times a day (BID) | ORAL | 1 refills | Status: DC | PRN

## 2020-09-12 ENCOUNTER — Ambulatory Visit: Payer: Medicare Other | Admitting: Cardiology

## 2020-09-23 ENCOUNTER — Other Ambulatory Visit: Payer: Self-pay | Admitting: Family

## 2020-09-23 DIAGNOSIS — J449 Chronic obstructive pulmonary disease, unspecified: Secondary | ICD-10-CM

## 2020-09-26 ENCOUNTER — Encounter: Payer: Self-pay | Admitting: Cardiology

## 2020-09-26 ENCOUNTER — Other Ambulatory Visit: Payer: Self-pay

## 2020-09-26 ENCOUNTER — Ambulatory Visit (INDEPENDENT_AMBULATORY_CARE_PROVIDER_SITE_OTHER): Payer: Medicare Other | Admitting: Cardiology

## 2020-09-26 VITALS — BP 106/54 | HR 64 | Ht 60.0 in | Wt 161.0 lb

## 2020-09-26 DIAGNOSIS — I2510 Atherosclerotic heart disease of native coronary artery without angina pectoris: Secondary | ICD-10-CM

## 2020-09-26 DIAGNOSIS — J449 Chronic obstructive pulmonary disease, unspecified: Secondary | ICD-10-CM | POA: Diagnosis not present

## 2020-09-26 DIAGNOSIS — Z01818 Encounter for other preprocedural examination: Secondary | ICD-10-CM | POA: Diagnosis not present

## 2020-09-26 DIAGNOSIS — I10 Essential (primary) hypertension: Secondary | ICD-10-CM | POA: Diagnosis not present

## 2020-09-26 NOTE — Assessment & Plan Note (Signed)
MI 09/03/2006 per Dr. Koleen Nimrod in [place] She has had no angina since moving her in 2017.  Echocardiogram done July 2021 showed preserved LV function.

## 2020-09-26 NOTE — Assessment & Plan Note (Signed)
Controlled- Echo 2021- LVH, grade 1 DD

## 2020-09-26 NOTE — Telephone Encounter (Signed)
   Primary Cardiologist: Dr Gwenlyn Found  Chart reviewed and the patient seen in the office today as part of pre-operative protocol coverage. Given past medical history and time since last visit, based on ACC/AHA guidelines, Courtney Brown would be at acceptable risk for the planned procedure without further cardiovascular testing.   OK to hold aspirin pre op if needed.   The patient was advised that if she develops new symptoms prior to surgery to contact our office to arrange for a follow-up visit, and she verbalized understanding.  I will route this recommendation to the requesting party via Epic fax function and remove from pre-op pool.  Please call with questions.  Kerin Ransom, PA-C 09/26/2020, 4:11 PM

## 2020-09-26 NOTE — Patient Instructions (Signed)
Medication Instructions:  No Changes  *If you need a refill on your cardiac medications before your next appointment, please call your pharmacy*   Lab Work: No Labs If you have labs (blood work) drawn today and your tests are completely normal, you will receive your results only by: . MyChart Message (if you have MyChart) OR . A paper copy in the mail If you have any lab test that is abnormal or we need to change your treatment, we will call you to review the results.   Testing/Procedures: No Testing   Follow-Up: At [facility], you and your health needs are our priority.  As part of our continuing mission to provide you with exceptional heart care, we have created designated Provider Care Teams.  These Care Teams include your primary Cardiologist (physician) and Advanced Practice Providers (APPs -  Physician Assistants and Nurse Practitioners) who all work together to provide you with the care you need, when you need it.  Your next appointment:   1 year(s)  The format for your next appointment:   In Person  Provider:   Jonathan Berry, MD    

## 2020-09-26 NOTE — Assessment & Plan Note (Signed)
Followed by [facility]- stable on inhalers

## 2020-09-26 NOTE — Assessment & Plan Note (Signed)
The patient is an acceptable risk for the proposed procedure without further cardiac work-up.  I will fax official clearance to the operating surgeon.

## 2020-09-26 NOTE — Progress Notes (Signed)
Cardiology Office Note:    Date:  09/26/2020   ID:  Courtney Brown, DOB 1941-04-14, MRN [id number]  PCP:  Courtney Brown, [place]  Cardiologist:  No primary care provider on file.  Electrophysiologist:  None   Referring MD: Courtney Brown,*   No chief complaint on file. Pre op clearance  History of Present Illness:    Courtney Brown is a 80 y.o. female with a hx of coronary disease.  In 2007 she quit smoking.  A year later she was admitted to the hospital in [facility] with a "heart attack".  She says she had a heart catheterization done but did not have to have a stent.  She did follow-up with a cardiologist there for 10 years.  She tells me he did due to stress test over that time and they were both okay.  She moved to [facility] in 2017.  She has been followed in the past by [facility] for her COPD.  She has treated hypertension.  She was seen by Dr. Alvester Brown in January 2021 for preop clearance prior to hernia repair.  He suggested an echocardiogram.  After that the patient had a fall followed by some deaths in the family and her echo was not done until July 2021.  It did reveal preserved LV function.  Patient is in the office today again for preop clearance prior to abdominal hernia repair.  She denies having any chest pain.  [place] and denies any unusual discomfort when she is walking around the park.  She does have chronic dyspnea on exertion which she attributes to her COPD, this is unchanged.  Past Medical History:  Diagnosis Date  . Allergy   . Anxiety   . Colon polyps   . COPD (chronic obstructive pulmonary disease) ([place])   . Emphysema of lung ([place])   . GERD (gastroesophageal reflux disease)   . H/O hiatal hernia   . Hyperlipidemia   . Hypertension   . Inverted nipple    bilateral, pt says that's normal  . Myocardial infarct, old 2008   no stent placed  . Osteoporosis   . Stroke [facility])    TIA    Past Surgical History:   Procedure Laterality Date  . ABDOMINAL HYSTERECTOMY  1998  . 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 HERNIA REPAIR     umbilical, ventral  . INSERTION OF ILIAC STENT  2008    Current Medications: Current Meds  Medication Sig  . acetaminophen (TYLENOL) 500 MG tablet Take 500 mg by mouth every 6 (six) hours as needed.  Marland Kitchen albuterol (PROAIR HFA) 108 (90 Base) MCG/ACT inhaler Inhale 1-2 puffs into the lungs every 6 (six) hours as needed for wheezing or shortness of breath.  . ALPRAZolam (XANAX) 0.25 MG tablet Take 1 tablet (0.25 mg total) by mouth 2 (two) times daily as needed for anxiety.  Marland Kitchen amLODipine (NORVASC) 10 MG tablet Take 1 tablet (10 mg total) by mouth daily.  Marland Kitchen aspirin 81 MG tablet Take 81 mg by mouth daily.  . Calcium Citrate-Vitamin D (CITRACAL + D PO) Take by mouth. Take twice a day as directed  . citalopram (CELEXA) 20 MG tablet Take 1 tablet (20 mg total) by mouth daily.  . colesevelam (WELCHOL) 625 MG tablet TAKE 1 TABLET BY MOUTH 2 TIMES DAILY WITH A MEAL  . fenofibrate (TRICOR) 145 MG tablet Take 1 tablet (145 mg total) by mouth daily.  Marland Kitchen  furosemide (LASIX) 20 MG tablet TAKE 1 TABLET BY MOUTH DAILY FOR LEG EDEMA  . gabapentin (NEURONTIN) 100 MG capsule TAKE 1 CAPSULE BY MOUTH THREE TIMES A DAY  . Garlic Oil 5885 MG CAPS Take 1,000 mg by mouth 1 day or 1 dose.  . Lactobacillus (PROBIOTIC ACIDOPHILUS) CAPS Take by mouth once.  . loratadine (CLARITIN) 10 MG tablet TAKE 1 TABLET BY MOUTH EVERY DAY  . magnesium oxide (MAG-OX) 400 MG tablet TAKE 1 TABLET BY MOUTH TWICE A DAY  . metoprolol succinate (TOPROL-XL) 50 MG 24 hr tablet Take 1 tablet (50 mg total) by mouth 2 (two) times daily.  . mometasone (NASONEX) 50 MCG/ACT nasal spray PLACE 2 SPRAYS INTO THE NOSE DAILY.  Marland Kitchen nystatin ointment (MYCOSTATIN) Apply 1 application topically 2 (two) times daily.  Marland Kitchen omeprazole (PRILOSEC) 20 MG capsule Take 1 capsule (20 mg total) by mouth daily.  . raloxifene (EVISTA) 60 MG  tablet Take 1 tablet (60 mg total) by mouth daily.  Marland Kitchen SPIRIVA RESPIMAT 2.5 MCG/ACT AERS 2 puff per day  . SYMBICORT 160-4.5 MCG/ACT inhaler 2 puffs bid  . triamcinolone cream (KENALOG) 0.1 % Apply 1 application topically 2 (two) times daily.     Allergies:   Prednisone and Statins   Social History   Socioeconomic History  . Marital status: Widowed    Spouse name: Not on file  . Number of children: Not on file  . Years of education: Not on file  . Highest education level: Not on file  Occupational History  . Not on file  Tobacco Use  . Smoking status: Former Smoker    Packs/day: 1.50    Years: 44.00    Pack years: 66.00    Types: Cigarettes    Quit date: 07/28/2005    Years since quitting: 15.1  . Smokeless tobacco: Never Used  Substance and Sexual Activity  . Alcohol use: Yes    Alcohol/week: 2.0 standard drinks    Types: 2 Shots of liquor per week    Comment: social, weekly  . Drug use: No  . Sexual activity: Not Currently  Other Topics Concern  . Not on file  Social History Narrative  . Not on file   Social Determinants of Health   Financial Resource Strain: Not on file  Food Insecurity: Not on file  Transportation Needs: Not on file  Physical Activity: Not on file  Stress: Not on file  Social Connections: Not on file     Family History: The patient's family history includes Arthritis in her mother; Cancer in her maternal grandmother and paternal grandmother; Hearing loss in her father, maternal aunt, and maternal grandmother. There is no history of Breast cancer.  ROS:   Please see the history of present illness.     All other systems reviewed and are negative.  EKGs/Labs/Other Studies Reviewed:    The following studies were reviewed today: Echo 02/10/2020- IMPRESSIONS    1. Left ventricular ejection fraction, by estimation, is 55 to 60%. The  left ventricle has normal function. The left ventricle has no regional  wall motion abnormalities. There is  moderate concentric left ventricular  hypertrophy. Left ventricular  diastolic parameters are consistent with Grade I diastolic dysfunction  (impaired relaxation).  2. Right ventricular systolic function is normal. The right ventricular  size is normal. There is normal pulmonary artery systolic pressure.  3. Right atrial size was mildly dilated.  4. The mitral valve is normal in structure. No evidence of mitral valve  regurgitation. No evidence of mitral stenosis.  5. The aortic valve is normal in structure. Aortic valve regurgitation is  not visualized. No aortic stenosis is present.  6. The inferior vena cava is normal in size with greater than 50%  respiratory variability, suggesting right atrial pressure of 3 mmHg.   EKG:  EKG is ordered today.  The ekg ordered today demonstrates NSR, HR 64, RAD  Recent Labs: 07/10/2020: ALT 13; BUN 26; Creatinine, Ser 1.19; Hemoglobin 13.7; Magnesium 1.7; Platelets 286.0; Potassium 3.7; Sodium 139  Recent Lipid Panel    Component Value Date/Time   CHOL 148 07/10/2020 1037   TRIG 135.0 07/10/2020 1037   HDL 46.80 07/10/2020 1037   CHOLHDL 3 07/10/2020 1037   VLDL 27.0 07/10/2020 1037   LDLCALC 74 07/10/2020 1037    Physical Exam:    VS:  BP (!) 106/54   Pulse 64   Ht 5' (1.524 m)   Wt 161 lb (73 kg)   BMI 31.44 kg/m     Wt Readings from Last 3 Encounters:  09/26/20 161 lb (73 kg)  07/10/20 164 lb (74.4 kg)  09/12/19 162 lb 3.2 oz (73.6 kg)     GEN: Obese caucasian female,  well developed in no acute distress HEENT: Normal NECK: No JVD; No carotid bruits CARDIAC: RRR, no murmurs, rubs, gallops RESPIRATORY:  Clear to auscultation without rales, wheezing or rhonchi  ABDOMEN: Obese, soft, non-tender, non-distended,peri umbilical hernia noted MUSCULOSKELETAL:  No edema; No deformity  SKIN: Warm and dry NEUROLOGIC:  Alert and oriented x 3 PSYCHIATRIC:  Normal affect   ASSESSMENT:    Preoperative clearance The patient is an  acceptable risk for the proposed procedure without further cardiac work-up.  I will fax official clearance to the operating surgeon.  CAD (coronary artery disease) MI 09/03/2006 per Dr. Koleen Nimrod in [place] She has had no angina since moving her in 2017.  Echocardiogram done July 2021 showed preserved LV function.  COPD GOLD II Followed by [facility]- stable on inhalers  HTN (hypertension), benign Controlled- Echo 2021- LVH, grade 1 DD  PLAN:    OK for surgery- I will fax clearance.  F/U Dr Gwenlyn Found in one year.    Medication Adjustments/Labs and Tests Ordered: Current medicines are reviewed at length with the patient today.  Concerns regarding medicines are outlined above.  Orders Placed This Encounter  Procedures  . EKG 12-Lead   No orders of the defined types were placed in this encounter.   Patient Instructions  Medication Instructions:  No Changes *If you need a refill on your cardiac medications before your next appointment, please call your pharmacy*   Lab Work: No Labs If you have labs (blood work) drawn today and your tests are completely normal, you will receive your results only by: Marland Kitchen MyChart Message (if you have MyChart) OR . A paper copy in the mail If you have any lab test that is abnormal or we need to change your treatment, we will call you to review the results.   Testing/Procedures: No Testing   Follow-Up: At [facility], you and your health needs are our priority.  As part of our continuing mission to provide you with exceptional heart care, we have created designated Provider Care Teams.  These Care Teams include your primary Cardiologist (physician) and Advanced Practice Providers (APPs -  Physician Assistants and Nurse Practitioners) who all work together to provide you with the care you need, when you need it.  Your next appointment:  1 year(s)  The format for your next appointment:   In Person  Provider:   Quay Burow,  MD       Signed, Kerin Ransom, PA-C  09/26/2020 4:03 PM    [place]

## 2020-10-08 ENCOUNTER — Other Ambulatory Visit: Payer: Self-pay | Admitting: Family

## 2020-10-08 DIAGNOSIS — J449 Chronic obstructive pulmonary disease, unspecified: Secondary | ICD-10-CM

## 2020-10-17 NOTE — Progress Notes (Addendum)
DUE TO COVID-19 ONLY ONE VISITOR IS ALLOWED TO COME WITH YOU AND STAY IN THE WAITING ROOM ONLY DURING PRE OP AND PROCEDURE DAY OF SURGERY. THE 1 VISITOR  MAY VISIT WITH YOU AFTER SURGERY IN YOUR PRIVATE ROOM DURING VISITING HOURS ONLY!  YOU NEED TO HAVE A COVID 19 TEST ON__3/29/2022_____ @_______ , THIS TEST MUST BE DONE BEFORE SURGERY,  COVID TESTING SITE [address] [address], IT IS ON THE RIGHT GOING OUT [address] APPROXIMATELY  2 MINUTES PAST [employer] ON THE RIGHT. ONCE YOUR COVID TEST IS COMPLETED,  PLEASE BEGIN THE QUARANTINE INSTRUCTIONS AS OUTLINED IN YOUR HANDOUT.                Courtney Brown  10/17/2020   Your procedure is scheduled on:  10/26/2020  Report to [facility] Main  Entrance   Report to admitting at      100pm   Call this number if you have problems the morning of surgery [phone]    REMEMBER: NO  SOLID FOOD CANDY OR GUM AFTER MIDNIGHT. CLEAR LIQUIDS UNTIL    12 noon        . NOTHING BY MOUTH EXCEPT CLEAR LIQUIDS UNTIL     12 noon   . PLEASE FINISH ENSURE DRINK PER SURGEON ORDER  WHICH NEEDS TO BE COMPLETED AT  12 noon     .      CLEAR LIQUID DIET   Foods Allowed                                                                    Coffee and tea, regular and decaf                            Fruit ices (not with fruit pulp)                                      Iced Popsicles                                    Carbonated beverages, regular and diet                                    Cranberry, grape and apple juices Sports drinks like Gatorade Lightly seasoned clear broth or consume(fat free) Sugar, honey syrup ___________________________________________________________________      BRUSH YOUR TEETH MORNING OF SURGERY AND RINSE YOUR MOUTH OUT, NO CHEWING GUM CANDY OR MINTS.     Take these medicines the morning of surgery with A SIP OF WATER:  Celexa, inhalers as usual and bring, amlodpine, toprol, nasal spray  , prilosec , gabapentin, claritin DO NOT TAKE ANY DIABETIC MEDICATIONS DAY OF YOUR SURGERY                               You may not have any metal on your body including hair pins  and              piercings  Do not wear jewelry, make-up, lotions, powders or perfumes, deodorant             Do not wear nail polish on your fingernails.  Do not shave  48 hours prior to surgery.              Men may shave face and neck.   Do not bring valuables to the hospital. [place].  Contacts, dentures or bridgework may not be worn into surgery.  Leave suitcase in the car. After surgery it may be brought to your room.     Patients discharged the day of surgery will not be allowed to drive home. IF YOU ARE HAVING SURGERY AND GOING HOME THE SAME DAY, YOU MUST HAVE AN ADULT TO DRIVE YOU HOME AND BE WITH YOU FOR 24 HOURS. YOU MAY GO HOME BY TAXI OR UBER OR ORTHERWISE, BUT AN ADULT MUST ACCOMPANY YOU HOME AND STAY WITH YOU FOR 24 HOURS.  Name and phone number of your driver:  Special Instructions: N/A              Please read over the following fact sheets you were given: _____________________________________________________________________  [facility] - Preparing for Surgery Before surgery, you can play an important role.  Because skin is not sterile, your skin needs to be as free of germs as possible.  You can reduce the number of germs on your skin by washing with CHG (chlorahexidine gluconate) soap before surgery.  CHG is an antiseptic cleaner which kills germs and bonds with the skin to continue killing germs even after washing. Please DO NOT use if you have an allergy to CHG or antibacterial soaps.  If your skin becomes reddened/irritated stop using the CHG and inform your nurse when you arrive at Short Stay. Do not shave (including legs and underarms) for at least 48 hours prior to the first CHG shower.  You may shave your face/neck. Please follow these  instructions carefully:  1.  Shower with CHG Soap the night before surgery and the  morning of Surgery.  2.  If you choose to wash your hair, wash your hair first as usual with your  normal  shampoo.  3.  After you shampoo, rinse your hair and body thoroughly to remove the  shampoo.                           4.  Use CHG as you would any other liquid soap.  You can apply chg directly  to the skin and wash                       Gently with a scrungie or clean washcloth.  5.  Apply the CHG Soap to your body ONLY FROM THE NECK DOWN.   Do not use on face/ open                           Wound or open sores. Avoid contact with eyes, ears mouth and genitals (private parts).                       Wash face,  Genitals (private parts) with your normal  soap.             6.  Wash thoroughly, paying special attention to the area where your surgery  will be performed.  7.  Thoroughly rinse your body with warm water from the neck down.  8.  DO NOT shower/wash with your normal soap after using and rinsing off  the CHG Soap.                9.  Pat yourself dry with a clean towel.            10.  Wear clean pajamas.            11.  Place clean sheets on your bed the night of your first shower and do not  sleep with pets. Day of Surgery : Do not apply any lotions/deodorants the morning of surgery.  Please wear clean clothes to the hospital/surgery center.  FAILURE TO FOLLOW THESE INSTRUCTIONS MAY RESULT IN THE CANCELLATION OF YOUR SURGERY PATIENT SIGNATURE_________________________________  NURSE SIGNATURE__________________________________  ________________________________________________________________________

## 2020-10-19 ENCOUNTER — Encounter (HOSPITAL_COMMUNITY): Payer: Self-pay

## 2020-10-19 ENCOUNTER — Other Ambulatory Visit: Payer: Self-pay

## 2020-10-19 ENCOUNTER — Encounter (HOSPITAL_COMMUNITY)
Admission: RE | Admit: 2020-10-19 | Discharge: 2020-10-19 | Disposition: A | Discharge status: Home / Self Care | Payer: Medicare Other | Source: Ambulatory Visit | Attending: General Surgery | Admitting: General Surgery

## 2020-10-19 DIAGNOSIS — Z01812 Encounter for preprocedural laboratory examination: Secondary | ICD-10-CM | POA: Diagnosis not present

## 2020-10-19 HISTORY — DX: Unspecified osteoarthritis, unspecified site: M19.90

## 2020-10-19 HISTORY — DX: Pneumonia, unspecified organism: J18.9

## 2020-10-19 HISTORY — DX: Dyspnea, unspecified: R06.00

## 2020-10-19 HISTORY — DX: Atherosclerotic heart disease of native coronary artery without angina pectoris: I25.10

## 2020-10-19 HISTORY — DX: Sepsis, unspecified organism: A41.9

## 2020-10-19 HISTORY — DX: Malignant (primary) neoplasm, unspecified: C80.1

## 2020-10-19 HISTORY — DX: Edema, unspecified: R60.9

## 2020-10-19 LAB — CBC
HCT: 44.6 % (ref 36.0–46.0)
Hemoglobin: 14.1 g/dL (ref 12.0–15.0)
MCH: 29.2 pg (ref 26.0–34.0)
MCHC: 31.6 g/dL (ref 30.0–36.0)
MCV: 92.3 fL (ref 80.0–100.0)
Platelets: 342 10*3/uL (ref 150–400)
RBC: 4.83 MIL/uL (ref 3.87–5.11)
RDW: 14.8 % (ref 11.5–15.5)
WBC: 8.3 10*3/uL (ref 4.0–10.5)
nRBC: 0 % (ref 0.0–0.2)

## 2020-10-19 LAB — BASIC METABOLIC PANEL
Anion gap: 8 (ref 5–15)
BUN: 23 mg/dL (ref 8–23)
CO2: 28 mmol/L (ref 22–32)
Calcium: 9 mg/dL (ref 8.9–10.3)
Chloride: 101 mmol/L (ref 98–111)
Creatinine, Ser: 1.04 mg/dL — ABNORMAL HIGH (ref 0.44–1.00)
GFR, Estimated: 55 mL/min — ABNORMAL LOW (ref >60)
Glucose, Bld: 101 mg/dL — ABNORMAL HIGH (ref 70–99)
Potassium: 4.1 mmol/L (ref 3.5–5.1)
Sodium: 137 mmol/L (ref 135–145)

## 2020-10-19 NOTE — Progress Notes (Addendum)
Anesthesia Review:  PCP: DR Jodi Mourning LOV 07/10/2020  Cardiologist : DR Lorie Phenix  09/26/20 preop cardiac visit with Jana Half  Chest x-ray : EKG :09/26/20  Echo :02/10/2020  PFT- 2019  Stress test: Cardiac Cath :  Activity level:  Sleep Study/ CPAP : Fasting Blood Sugar :      / Checks Blood Sugar -- times a day:   Blood Thinner/ Instructions /Last Dose: ASA / Instructions/ Last Dose :  81 mg Aspirin

## 2020-10-22 NOTE — Progress Notes (Signed)
Anesthesia Chart Review   Case: [id number] Date/Time: 10/26/20 1445   Procedure: LAPAROSCOPIC INCISIONAL HERNIA REPAIR (N/A )   Anesthesia type: General   Pre-op diagnosis: INCISIONAL HERNIA   Location: [facility] ROOM 01 / [facility]   Surgeons: Kinsinger, [name], MD      DISCUSSION:80 y.o. former smoker with h/o GERD, HTN, COPD, CAD (MI 2008), incisional hernia scheduled for above procedure 10/26/20 with Dr. Gurney Maxin.   Pt last seen by cardiology 09/26/20. Per OV note, "The patient is an acceptable risk for the proposed procedure without further cardiac work-up." VS: BP (!) 137/59   Pulse 62   Temp 36.8 C (Oral)   Resp 16   SpO2 97%   PROVIDERS: [name], FNP is PCP   Quay Burow, MD is Cardiologist  LABS: Labs reviewed: Acceptable for surgery. (all labs ordered are listed, but only abnormal results are displayed)  Labs Reviewed  BASIC METABOLIC PANEL - Abnormal; Notable for the following components:      Result Value   Glucose, Bld 101 (*)    Creatinine, Ser 1.04 (*)    GFR, Estimated 55 (*)    All other components within normal limits  CBC     IMAGES:   EKG: 09/26/20 Rate 64 bpm    CV: Echo 02/10/2020 IMPRESSIONS    1. Left ventricular ejection fraction, by estimation, is 55 to 60%. The  left ventricle has normal function. The left ventricle has no regional  wall motion abnormalities. There is moderate concentric left ventricular  hypertrophy. Left ventricular  diastolic parameters are consistent with Grade I diastolic dysfunction  (impaired relaxation).  2. Right ventricular systolic function is normal. The right ventricular  size is normal. There is normal pulmonary artery systolic pressure.  3. Right atrial size was mildly dilated.  4. The mitral valve is normal in structure. No evidence of mitral valve  regurgitation. No evidence of mitral stenosis.  5. The aortic valve is normal in structure. Aortic valve regurgitation is  not  visualized. No aortic stenosis is present.  6. The inferior vena cava is normal in size with greater than 50%  respiratory variability, suggesting right atrial pressure of 3 mmHg. Past Medical History:  Diagnosis Date  . Allergy   . Anxiety   . Arthritis   . Cancer (HCC)    hx of melanoma   . Colon polyps   . COPD (chronic obstructive pulmonary disease) ([place])   . Coronary artery disease   . Dyspnea    with exertion   . Edema    in lower extremties   . Emphysema of lung ([place])   . GERD (gastroesophageal reflux disease)   . H/O hiatal hernia   . Hyperlipidemia   . Hypertension   . Inverted nipple    bilateral, pt says that's normal  . Myocardial infarct, old 2008   no stent placed  . Osteoporosis   . Pneumonia    hx of   . 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 Stroke [facility])    TIA    Past Surgical History:  Procedure Laterality Date  . ABDOMINAL HYSTERECTOMY  1998  . 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 HERNIA REPAIR     umbilical, ventral  . INSERTION OF ILIAC STENT  2008    MEDICATIONS: . acetaminophen (TYLENOL) 500 MG tablet  . albuterol (PROAIR HFA) 108 (90  Base) MCG/ACT inhaler  . ALPRAZolam (XANAX) 0.25 MG tablet  . amLODipine (NORVASC) 10 MG tablet  . aspirin 81 MG tablet  . Calcium Citrate-Vitamin D (CITRACAL + D PO)  . citalopram (CELEXA) 20 MG tablet  . colesevelam (WELCHOL) 625 MG tablet  . fenofibrate (TRICOR) 145 MG tablet  . furosemide (LASIX) 20 MG tablet  . gabapentin (NEURONTIN) 100 MG capsule  . Garlic Oil 0981 MG CAPS  . Lactobacillus (PROBIOTIC ACIDOPHILUS) CAPS  . loratadine (CLARITIN) 10 MG tablet  . magnesium oxide (MAG-OX) 400 MG tablet  . metoprolol succinate (TOPROL-XL) 50 MG 24 hr tablet  . mometasone (NASONEX) 50 MCG/ACT nasal spray  . nystatin ointment (MYCOSTATIN)  . omeprazole (PRILOSEC) 20 MG capsule  . raloxifene (EVISTA) 60 MG tablet  .  SPIRIVA RESPIMAT 2.5 MCG/ACT AERS  . SYMBICORT 160-4.5 MCG/ACT inhaler  . triamcinolone cream (KENALOG) 0.1 %   No current facility-administered medications for this encounter.    Konrad Felix, PA-C [facility] Pre-Surgical Testing [phone]

## 2020-10-23 ENCOUNTER — Other Ambulatory Visit (HOSPITAL_COMMUNITY)
Admission: RE | Admit: 2020-10-23 | Discharge: 2020-10-23 | Disposition: A | Discharge status: Home / Self Care | Payer: Medicare Other | Source: Ambulatory Visit | Attending: General Surgery | Admitting: General Surgery

## 2020-10-23 DIAGNOSIS — J449 Chronic obstructive pulmonary disease, unspecified: Secondary | ICD-10-CM | POA: Diagnosis not present

## 2020-10-23 DIAGNOSIS — I2510 Atherosclerotic heart disease of native coronary artery without angina pectoris: Secondary | ICD-10-CM | POA: Diagnosis not present

## 2020-10-23 DIAGNOSIS — Z7982 Long term (current) use of aspirin: Secondary | ICD-10-CM | POA: Diagnosis not present

## 2020-10-23 DIAGNOSIS — K219 Gastro-esophageal reflux disease without esophagitis: Secondary | ICD-10-CM | POA: Diagnosis not present

## 2020-10-23 DIAGNOSIS — E785 Hyperlipidemia, unspecified: Secondary | ICD-10-CM | POA: Diagnosis not present

## 2020-10-23 DIAGNOSIS — Z87891 Personal history of nicotine dependence: Secondary | ICD-10-CM | POA: Diagnosis not present

## 2020-10-23 DIAGNOSIS — K922 Gastrointestinal hemorrhage, unspecified: Secondary | ICD-10-CM | POA: Diagnosis not present

## 2020-10-23 DIAGNOSIS — Z800 Family history of malignant neoplasm of digestive organs: Secondary | ICD-10-CM | POA: Diagnosis not present

## 2020-10-23 DIAGNOSIS — J439 Emphysema, unspecified: Secondary | ICD-10-CM | POA: Diagnosis not present

## 2020-10-23 DIAGNOSIS — Z5331 Laparoscopic surgical procedure converted to open procedure: Secondary | ICD-10-CM | POA: Diagnosis not present

## 2020-10-23 DIAGNOSIS — Z79899 Other long term (current) drug therapy: Secondary | ICD-10-CM | POA: Diagnosis not present

## 2020-10-23 DIAGNOSIS — Z20822 Contact with and (suspected) exposure to covid-19: Secondary | ICD-10-CM | POA: Insufficient documentation

## 2020-10-23 DIAGNOSIS — E1151 Type 2 diabetes mellitus with diabetic peripheral angiopathy without gangrene: Secondary | ICD-10-CM | POA: Diagnosis not present

## 2020-10-23 DIAGNOSIS — I252 Old myocardial infarction: Secondary | ICD-10-CM | POA: Diagnosis not present

## 2020-10-23 DIAGNOSIS — T83728A Exposure of other implanted mesh and other prosthetic materials to surrounding organ or tissue, initial encounter: Secondary | ICD-10-CM | POA: Diagnosis not present

## 2020-10-23 DIAGNOSIS — E669 Obesity, unspecified: Secondary | ICD-10-CM | POA: Diagnosis present

## 2020-10-23 DIAGNOSIS — Z8673 Personal history of transient ischemic attack (TIA), and cerebral infarction without residual deficits: Secondary | ICD-10-CM | POA: Diagnosis not present

## 2020-10-23 DIAGNOSIS — Z4589 Encounter for adjustment and management of other implanted devices: Secondary | ICD-10-CM | POA: Diagnosis not present

## 2020-10-23 DIAGNOSIS — K529 Noninfective gastroenteritis and colitis, unspecified: Secondary | ICD-10-CM | POA: Diagnosis not present

## 2020-10-23 DIAGNOSIS — Z6831 Body mass index (BMI) 31.0-31.9, adult: Secondary | ICD-10-CM | POA: Diagnosis not present

## 2020-10-23 DIAGNOSIS — Z7951 Long term (current) use of inhaled steroids: Secondary | ICD-10-CM | POA: Diagnosis not present

## 2020-10-23 DIAGNOSIS — Z01812 Encounter for preprocedural laboratory examination: Secondary | ICD-10-CM | POA: Insufficient documentation

## 2020-10-23 DIAGNOSIS — I10 Essential (primary) hypertension: Secondary | ICD-10-CM | POA: Diagnosis not present

## 2020-10-23 DIAGNOSIS — F419 Anxiety disorder, unspecified: Secondary | ICD-10-CM | POA: Diagnosis present

## 2020-10-23 DIAGNOSIS — K449 Diaphragmatic hernia without obstruction or gangrene: Secondary | ICD-10-CM | POA: Diagnosis not present

## 2020-10-23 DIAGNOSIS — K660 Peritoneal adhesions (postprocedural) (postinfection): Secondary | ICD-10-CM | POA: Diagnosis not present

## 2020-10-23 DIAGNOSIS — K432 Incisional hernia without obstruction or gangrene: Secondary | ICD-10-CM | POA: Diagnosis not present

## 2020-10-23 LAB — SARS CORONAVIRUS 2 (TAT 6-24 HRS): SARS Coronavirus 2: NEGATIVE

## 2020-10-25 MED ORDER — BUPIVACAINE LIPOSOME 1.3 % IJ SUSP
20.0000 mL | Freq: Once | INTRAMUSCULAR | Status: DC
  Filled 2020-10-25: qty 20

## 2020-10-26 ENCOUNTER — Ambulatory Visit (HOSPITAL_COMMUNITY): Payer: Medicare Other | Admitting: Certified Registered Nurse Anesthetist

## 2020-10-26 ENCOUNTER — Ambulatory Visit (HOSPITAL_COMMUNITY): Payer: Medicare Other | Admitting: Physician Assistant

## 2020-10-26 ENCOUNTER — Inpatient Hospital Stay (HOSPITAL_COMMUNITY)
Admission: RE | Admit: 2020-10-26 | Discharge: 2020-10-30 | DRG: 331 | Disposition: A | Discharge status: Home / Self Care | Payer: Medicare Other | Attending: General Surgery | Admitting: General Surgery

## 2020-10-26 ENCOUNTER — Encounter (HOSPITAL_COMMUNITY): Payer: Self-pay | Admitting: General Surgery

## 2020-10-26 ENCOUNTER — Encounter (HOSPITAL_COMMUNITY)
Admission: RE | Disposition: A | Discharge status: Home / Self Care | Payer: Self-pay | Source: Home / Self Care | Attending: General Surgery

## 2020-10-26 DIAGNOSIS — E1151 Type 2 diabetes mellitus with diabetic peripheral angiopathy without gangrene: Secondary | ICD-10-CM | POA: Diagnosis present

## 2020-10-26 DIAGNOSIS — Z79899 Other long term (current) drug therapy: Secondary | ICD-10-CM

## 2020-10-26 DIAGNOSIS — I2510 Atherosclerotic heart disease of native coronary artery without angina pectoris: Secondary | ICD-10-CM | POA: Diagnosis present

## 2020-10-26 DIAGNOSIS — Z8673 Personal history of transient ischemic attack (TIA), and cerebral infarction without residual deficits: Secondary | ICD-10-CM | POA: Diagnosis not present

## 2020-10-26 DIAGNOSIS — I10 Essential (primary) hypertension: Secondary | ICD-10-CM | POA: Diagnosis not present

## 2020-10-26 DIAGNOSIS — Z7951 Long term (current) use of inhaled steroids: Secondary | ICD-10-CM | POA: Diagnosis not present

## 2020-10-26 DIAGNOSIS — J439 Emphysema, unspecified: Secondary | ICD-10-CM | POA: Diagnosis not present

## 2020-10-26 DIAGNOSIS — I252 Old myocardial infarction: Secondary | ICD-10-CM | POA: Diagnosis not present

## 2020-10-26 DIAGNOSIS — J449 Chronic obstructive pulmonary disease, unspecified: Secondary | ICD-10-CM | POA: Diagnosis not present

## 2020-10-26 DIAGNOSIS — K449 Diaphragmatic hernia without obstruction or gangrene: Secondary | ICD-10-CM | POA: Diagnosis not present

## 2020-10-26 DIAGNOSIS — Z800 Family history of malignant neoplasm of digestive organs: Secondary | ICD-10-CM | POA: Diagnosis not present

## 2020-10-26 DIAGNOSIS — Z87891 Personal history of nicotine dependence: Secondary | ICD-10-CM

## 2020-10-26 DIAGNOSIS — Z7982 Long term (current) use of aspirin: Secondary | ICD-10-CM | POA: Diagnosis not present

## 2020-10-26 DIAGNOSIS — Z6831 Body mass index (BMI) 31.0-31.9, adult: Secondary | ICD-10-CM | POA: Diagnosis not present

## 2020-10-26 DIAGNOSIS — K432 Incisional hernia without obstruction or gangrene: Secondary | ICD-10-CM | POA: Diagnosis not present

## 2020-10-26 DIAGNOSIS — K660 Peritoneal adhesions (postprocedural) (postinfection): Secondary | ICD-10-CM | POA: Diagnosis not present

## 2020-10-26 DIAGNOSIS — Z5331 Laparoscopic surgical procedure converted to open procedure: Secondary | ICD-10-CM

## 2020-10-26 DIAGNOSIS — K219 Gastro-esophageal reflux disease without esophagitis: Secondary | ICD-10-CM | POA: Diagnosis present

## 2020-10-26 DIAGNOSIS — F419 Anxiety disorder, unspecified: Secondary | ICD-10-CM | POA: Diagnosis present

## 2020-10-26 DIAGNOSIS — E669 Obesity, unspecified: Secondary | ICD-10-CM | POA: Diagnosis present

## 2020-10-26 DIAGNOSIS — E785 Hyperlipidemia, unspecified: Secondary | ICD-10-CM | POA: Diagnosis not present

## 2020-10-26 HISTORY — PX: INCISIONAL HERNIA REPAIR: SHX193

## 2020-10-26 LAB — CBC
HCT: 42.6 % (ref 36.0–46.0)
Hemoglobin: 13.5 g/dL (ref 12.0–15.0)
MCH: 29.8 pg (ref 26.0–34.0)
MCHC: 31.7 g/dL (ref 30.0–36.0)
MCV: 94 fL (ref 80.0–100.0)
Platelets: 319 10*3/uL (ref 150–400)
RBC: 4.53 MIL/uL (ref 3.87–5.11)
RDW: 15 % (ref 11.5–15.5)
WBC: 14.8 10*3/uL — ABNORMAL HIGH (ref 4.0–10.5)
nRBC: 0 % (ref 0.0–0.2)

## 2020-10-26 LAB — CREATININE, SERUM
Creatinine, Ser: 1.22 mg/dL — ABNORMAL HIGH (ref 0.44–1.00)
GFR, Estimated: 45 mL/min — ABNORMAL LOW (ref >60)

## 2020-10-26 SURGERY — REPAIR, HERNIA, INCISIONAL
Anesthesia: General | Site: Abdomen

## 2020-10-26 MED ORDER — ENOXAPARIN SODIUM 40 MG/0.4ML ~~LOC~~ SOLN
40.0000 mg | SUBCUTANEOUS | Status: DC
  Administered 2020-10-27 – 2020-10-30 (×4): 40 mg via SUBCUTANEOUS
  Filled 2020-10-26 (×4): qty 0.4

## 2020-10-26 MED ORDER — ROCURONIUM BROMIDE 10 MG/ML (PF) SYRINGE
PREFILLED_SYRINGE | INTRAVENOUS | Status: DC | PRN
  Administered 2020-10-26: 60 mg via INTRAVENOUS

## 2020-10-26 MED ORDER — PROPOFOL 10 MG/ML IV BOLUS
INTRAVENOUS | Status: DC | PRN
  Administered 2020-10-26: 120 mg via INTRAVENOUS

## 2020-10-26 MED ORDER — DEXAMETHASONE SODIUM PHOSPHATE 10 MG/ML IJ SOLN
INTRAMUSCULAR | Status: DC | PRN
  Administered 2020-10-26: 4 mg via INTRAVENOUS

## 2020-10-26 MED ORDER — ONDANSETRON HCL 4 MG/2ML IJ SOLN
INTRAMUSCULAR | Status: AC
  Filled 2020-10-26: qty 2

## 2020-10-26 MED ORDER — DIPHENHYDRAMINE HCL 50 MG/ML IJ SOLN: 12.5000 mg | Freq: Four times a day (QID) | INTRAMUSCULAR | Status: DC | PRN

## 2020-10-26 MED ORDER — DIPHENHYDRAMINE HCL 12.5 MG/5ML PO ELIX: 12.5000 mg | ORAL_SOLUTION | Freq: Four times a day (QID) | ORAL | Status: DC | PRN

## 2020-10-26 MED ORDER — FENTANYL CITRATE (PF) 100 MCG/2ML IJ SOLN
INTRAMUSCULAR | Status: AC
  Filled 2020-10-26: qty 2

## 2020-10-26 MED ORDER — CHLORHEXIDINE GLUCONATE CLOTH 2 % EX PADS: 6.0000 | MEDICATED_PAD | Freq: Once | CUTANEOUS | Status: DC

## 2020-10-26 MED ORDER — SUGAMMADEX SODIUM 200 MG/2ML IV SOLN
INTRAVENOUS | Status: DC | PRN
  Administered 2020-10-26: 200 mg via INTRAVENOUS

## 2020-10-26 MED ORDER — CELECOXIB 200 MG PO CAPS
400.0000 mg | ORAL_CAPSULE | ORAL | Status: AC
  Administered 2020-10-26: 400 mg via ORAL
  Filled 2020-10-26: qty 2

## 2020-10-26 MED ORDER — BUPIVACAINE-EPINEPHRINE 0.25% -1:200000 IJ SOLN
INTRAMUSCULAR | Status: DC | PRN
  Administered 2020-10-26: 30 mL

## 2020-10-26 MED ORDER — 0.9 % SODIUM CHLORIDE (POUR BTL) OPTIME
TOPICAL | Status: DC | PRN
  Administered 2020-10-26: 1000 mL

## 2020-10-26 MED ORDER — LIDOCAINE 2% (20 MG/ML) 5 ML SYRINGE
INTRAMUSCULAR | Status: DC | PRN
  Administered 2020-10-26: 80 mg via INTRAVENOUS

## 2020-10-26 MED ORDER — CHLORHEXIDINE GLUCONATE 0.12 % MT SOLN
15.0000 mL | Freq: Once | OROMUCOSAL | Status: AC
  Administered 2020-10-26: 15 mL via OROMUCOSAL

## 2020-10-26 MED ORDER — BUPIVACAINE LIPOSOME 1.3 % IJ SUSP
INTRAMUSCULAR | Status: DC | PRN
  Administered 2020-10-26: 20 mL

## 2020-10-26 MED ORDER — ORAL CARE MOUTH RINSE: 15.0000 mL | Freq: Once | OROMUCOSAL | Status: AC

## 2020-10-26 MED ORDER — MORPHINE SULFATE (PF) 2 MG/ML IV SOLN: 2.0000 mg | INTRAVENOUS | Status: DC | PRN

## 2020-10-26 MED ORDER — FENTANYL CITRATE (PF) 100 MCG/2ML IJ SOLN
25.0000 ug | INTRAMUSCULAR | Status: DC | PRN
  Administered 2020-10-26 (×2): 50 ug via INTRAVENOUS

## 2020-10-26 MED ORDER — ONDANSETRON 4 MG PO TBDP: 4.0000 mg | ORAL_TABLET | Freq: Four times a day (QID) | ORAL | Status: DC | PRN

## 2020-10-26 MED ORDER — ONDANSETRON HCL 4 MG/2ML IJ SOLN
INTRAMUSCULAR | Status: DC | PRN
  Administered 2020-10-26: 4 mg via INTRAVENOUS

## 2020-10-26 MED ORDER — ONDANSETRON HCL 4 MG/2ML IJ SOLN: 4.0000 mg | Freq: Four times a day (QID) | INTRAMUSCULAR | Status: DC | PRN

## 2020-10-26 MED ORDER — TRAMADOL HCL 50 MG PO TABS: 50.0000 mg | ORAL_TABLET | Freq: Four times a day (QID) | ORAL | Status: DC | PRN

## 2020-10-26 MED ORDER — OXYCODONE HCL 5 MG PO TABS
5.0000 mg | ORAL_TABLET | ORAL | Status: DC | PRN
  Administered 2020-10-27 (×2): 10 mg via ORAL
  Filled 2020-10-26 (×2): qty 2

## 2020-10-26 MED ORDER — ONDANSETRON HCL 4 MG/2ML IJ SOLN: 4.0000 mg | Freq: Once | INTRAMUSCULAR | Status: DC | PRN

## 2020-10-26 MED ORDER — DEXAMETHASONE SODIUM PHOSPHATE 10 MG/ML IJ SOLN
INTRAMUSCULAR | Status: AC
  Filled 2020-10-26: qty 1

## 2020-10-26 MED ORDER — OXYCODONE HCL 5 MG PO TABS: 5.0000 mg | ORAL_TABLET | Freq: Once | ORAL | Status: DC | PRN

## 2020-10-26 MED ORDER — CEFAZOLIN SODIUM-DEXTROSE 2-4 GM/100ML-% IV SOLN
2.0000 g | INTRAVENOUS | Status: AC
  Administered 2020-10-26: 2 g via INTRAVENOUS
  Filled 2020-10-26: qty 100

## 2020-10-26 MED ORDER — LIDOCAINE 2% (20 MG/ML) 5 ML SYRINGE
INTRAMUSCULAR | Status: AC
  Filled 2020-10-26: qty 5

## 2020-10-26 MED ORDER — OXYCODONE HCL 5 MG/5ML PO SOLN: 5.0000 mg | Freq: Once | ORAL | Status: DC | PRN

## 2020-10-26 MED ORDER — ACETAMINOPHEN 500 MG PO TABS
1000.0000 mg | ORAL_TABLET | ORAL | Status: AC
  Administered 2020-10-26: 1000 mg via ORAL
  Filled 2020-10-26: qty 2

## 2020-10-26 MED ORDER — FENTANYL CITRATE (PF) 100 MCG/2ML IJ SOLN
INTRAMUSCULAR | Status: DC | PRN
  Administered 2020-10-26 (×4): 50 ug via INTRAVENOUS

## 2020-10-26 MED ORDER — LACTATED RINGERS IV SOLN: INTRAVENOUS | Status: DC

## 2020-10-26 MED ORDER — ROCURONIUM BROMIDE 10 MG/ML (PF) SYRINGE
PREFILLED_SYRINGE | INTRAVENOUS | Status: AC
  Filled 2020-10-26: qty 10

## 2020-10-26 SURGICAL SUPPLY — 45 items
APPLIER CLIP 5 13 M/L LIGAMAX5 (MISCELLANEOUS)
BENZOIN TINCTURE PRP APPL 2/3 (GAUZE/BANDAGES/DRESSINGS) ×3 IMPLANT
BINDER ABDOMINAL 12 ML 46-62 (SOFTGOODS) IMPLANT
BNDG ADH 1X3 SHEER STRL LF (GAUZE/BANDAGES/DRESSINGS) ×9 IMPLANT
CABLE HIGH FREQUENCY MONO STRZ (ELECTRODE) ×3 IMPLANT
CHLORAPREP W/TINT 26 (MISCELLANEOUS) ×3 IMPLANT
CLIP APPLIE 5 13 M/L LIGAMAX5 (MISCELLANEOUS) IMPLANT
COVER SURGICAL LIGHT HANDLE (MISCELLANEOUS) ×3 IMPLANT
COVER WAND RF STERILE (DRAPES) IMPLANT
DECANTER SPIKE VIAL GLASS SM (MISCELLANEOUS) ×3 IMPLANT
DEVICE SECURE STRAP 25 ABSORB (INSTRUMENTS) IMPLANT
DRAIN CHANNEL 19F RND (DRAIN) IMPLANT
DRSG OPSITE POSTOP 4X10 (GAUZE/BANDAGES/DRESSINGS) ×3 IMPLANT
ELECT REM PT RETURN 15FT ADLT (MISCELLANEOUS) ×3 IMPLANT
EVACUATOR SILICONE 100CC (DRAIN) IMPLANT
GLOVE SURG POLYISO LF SZ7 (GLOVE) ×3 IMPLANT
GLOVE SURG UNDER POLY LF SZ7 (GLOVE) ×3 IMPLANT
GOWN STRL REUS W/TWL LRG LVL3 (GOWN DISPOSABLE) ×3 IMPLANT
GOWN STRL REUS W/TWL XL LVL3 (GOWN DISPOSABLE) ×6 IMPLANT
GRASPER SUT TROCAR 14GX15 (MISCELLANEOUS) ×3 IMPLANT
HANDLE SUCTION POOLE (INSTRUMENTS) ×2 IMPLANT
KIT BASIN OR (CUSTOM PROCEDURE TRAY) ×3 IMPLANT
KIT TURNOVER KIT A (KITS) ×3 IMPLANT
PENCIL SMOKE EVACUATOR (MISCELLANEOUS) IMPLANT
RELOAD PROXIMATE 100 BLUE (MISCELLANEOUS) IMPLANT
RELOAD PROXIMATE 100MM BLUE (MISCELLANEOUS)
RELOAD PROXIMATE 75MM BLUE (ENDOMECHANICALS) ×6 IMPLANT
SCISSORS LAP 5X35 DISP (ENDOMECHANICALS) ×3 IMPLANT
SET IRRIG TUBING LAPAROSCOPIC (IRRIGATION / IRRIGATOR) IMPLANT
SET TUBE SMOKE EVAC HIGH FLOW (TUBING) ×3 IMPLANT
SHEARS HARMONIC ACE PLUS 36CM (ENDOMECHANICALS) ×3 IMPLANT
SLEEVE XCEL OPT CAN 5 100 (ENDOMECHANICALS) ×3 IMPLANT
STAPLER GUN LINEAR PROX 60 (STAPLE) ×3 IMPLANT
STAPLER PROXIMATE 75MM BLUE (STAPLE) ×3 IMPLANT
STRIP CLOSURE SKIN 1/2X4 (GAUZE/BANDAGES/DRESSINGS) ×3 IMPLANT
SUCTION POOLE HANDLE (INSTRUMENTS) ×3
SUT ETHILON 2 0 PS N (SUTURE) IMPLANT
SUT MNCRL AB 4-0 PS2 18 (SUTURE) ×3 IMPLANT
SUT NOVA NAB GS-21 0 18 T12 DT (SUTURE) ×6 IMPLANT
SUT PDS AB 0 CT 36 (SUTURE) IMPLANT
SUT VICRYL 0 UR6 27IN ABS (SUTURE) IMPLANT
TOWEL OR 17X26 10 PK STRL BLUE (TOWEL DISPOSABLE) ×3 IMPLANT
TRAY LAPAROSCOPIC (CUSTOM PROCEDURE TRAY) ×3 IMPLANT
TROCAR BLADELESS OPT 5 100 (ENDOMECHANICALS) ×3 IMPLANT
TROCAR XCEL 12X100 BLDLESS (ENDOMECHANICALS) ×3 IMPLANT

## 2020-10-26 NOTE — Transfer of Care (Signed)
Immediate Anesthesia Transfer of Care Note  Patient: Courtney Brown  Procedure(s) Performed: DIAGNOSTIC LAPAROSCOPY MESH EXPLANTATION; SMALL BOWEL RESECTION; PRIMARY REPAIR OF INCISIONAL HERNIA (N/A Abdomen)  Patient Location: PACU  Anesthesia Type:General  Level of Consciousness: drowsy and patient cooperative  Airway & Oxygen Therapy: Patient Spontanous Breathing and Patient connected to face mask oxygen  Post-op Assessment: Report given to RN and Post -op Vital signs reviewed and stable  Post vital signs: Reviewed and stable  Last Vitals:  Vitals Value Taken Time  BP 161/75 10/26/20 1750  Temp    Pulse 62 10/26/20 1753  Resp 14 10/26/20 1753  SpO2 99 % 10/26/20 1753  Vitals shown include unvalidated device data.  Last Pain:  Vitals:   10/26/20 1314  TempSrc: Oral  PainSc: 0-No pain      Patients Stated Pain Goal: 3 (83/46/21 9471)  Complications: No complications documented.

## 2020-10-26 NOTE — Op Note (Signed)
Procedure(s): DIAGNOSTIC LAPAROSCOPY MESH EXPLANTATION; SMALL BOWEL RESECTION; PRIMARY REPAIR OF INCISIONAL HERNIA Procedure Note  Yomaira Solar female 80 y.o. 10/26/2020  Procedure(s) and Anesthesia Type:    * DIAGNOSTIC LAPAROSCOPY MESH EXPLANTATION; SMALL BOWEL RESECTION; PRIMARY REPAIR OF INCISIONAL HERNIA - General  Surgeon(s) and Role:    * Kinsinger, [name], MD - Primary    * Caryn Bee, MD - Resident-Assisting   Indications:  Ms. Catarino is a 80 year old female with a history of umbilical hernia for which she underwent open mesh repair in the past.  Now she presented with a symptomatic recurrent ventral hernia.  She was therefore scheduled for elective laparoscopic ventral hernia repair.     Surgeon: [name] Kinsinger  Assistants: Caryn Bee, MD  Anesthesia: General endotracheal anesthesia   Procedure Detail DIAGNOSTIC LAPAROSCOPY MESH EXPLANTATION; SMALL BOWEL RESECTION; PRIMARY REPAIR OF INCISIONAL HERNIA  After informed consent was obtained, the patient was brought to the operating room and positioned on the operating room table in the supine position with both arms tucked.  After an adequate level of general anesthesia been obtained, a urinary catheter was placed without any complications.  The abdomen was then prepped and draped in standard sterile fashion.  A surgical timeout was performed following the standard institutional protocol.  We started with a 5 mm incision in the left upper quadrant right below the costal margin.  The abdomen was then entered under direct visualization in Optiview technique.  Pneumoperitoneum was achieved to insufflation pressure of 15 mmHg.  Diagnostic laparoscopy was performed did reveal a recurrent ventral hernia with the old mesh partially nonadherent to the anterior abdominal wall on the left.  There was also moderate amount of adhesions between the omentum and anterior abdominal wall.  We then placed 2 additional 5 mm ports  along the left lateral abdomen.  We then started lysing the adhesions between the omentum and anterior abdominal wall the combination of blunt dissection and the harmonic energy device.  We then encountered a loop of small bowel that was also adherent to the anterior abdominal wall and after a short period we identified a injury to this segment of small bowel.  There was no spillage of GI contents. At this point we converted to an open procedure.  A 59RC supraumbilical midline laparotomy was performed and dissection carried down to the fascia/hernia sac with electrocautery.  Of note, we encountered a pus pocket right on top of the old mesh.  The mesh was easily identified and completely excised.  We also finished the lysis of adhesion inferiorly.  Of note,  the previously injured loop of small bowel was very densely adherent to the mesh.  We then completely mobilized this loop of small bowel and resected the injured segment with two 75 mm GIA staple loads.  We then performed a stapled side-to-side functional end-to-end anastomosis with an additional 75 mm GIA staple load.  The common enterotomy was closed with a TA stapler.  The mesenteric defect was closed with 2 figure-of-eight 3-0 silk stitches.  A crotch stitch of 3-0 silk was placed.  We then raised bilateral cutaneous flaps and dissected back to healthy fascia on both sides.  The fascia was then closed primarily with 2 running 0 PDS sutures.  0.5% Marcaine mixed one-to-one with Exparel was injected into the fascia and subcutaneous tissues.  The skin was closed with staples and a dry dressing was applied.  All 3 port sites were also stapled ended dry dressing was applied.  All  needle and instrument counts were correct at the end of the case.  The patient was awakened from anesthesia without any complications and transferred to PACU in stable condition.  Findings: Recurrent ventral hernia, densely adherent small bowel to the mesh and anterior abdominal  wall, moderate amount adhesions between omentum and anterior abdominal wall, pus pocket anterior to mesh  Estimated Blood Loss:  less than 50 mL         Drains: None         Total IV Fluids: see anesthesia record  Blood Given: none          Specimens: explanted mesh         Implants: none        Complications:  * No complications entered in OR log *         Disposition: PACU - hemodynamically stable.         Condition: stable

## 2020-10-26 NOTE — Anesthesia Procedure Notes (Signed)
Procedure Name: Intubation Date/Time: 10/26/2020 4:02 PM Performed by: Montel Clock, CRNA Pre-anesthesia Checklist: Patient identified, Emergency Drugs available, Suction available, Patient being monitored and Timeout performed Patient Re-evaluated:Patient Re-evaluated prior to induction Oxygen Delivery Method: Circle system utilized Preoxygenation: Pre-oxygenation with 100% oxygen Induction Type: IV induction Ventilation: Mask ventilation without difficulty and Oral airway inserted - appropriate to patient size Laryngoscope Size: Mac and 3 Grade View: Grade I Tube type: Oral Tube size: 7.0 mm Number of attempts: 1 Airway Equipment and Method: Stylet Placement Confirmation: ETT inserted through vocal cords under direct vision,  positive ETCO2 and breath sounds checked- equal and bilateral Secured at: 21 cm Tube secured with: Tape Dental Injury: Teeth and Oropharynx as per pre-operative assessment

## 2020-10-26 NOTE — H&P (Signed)
Courtney Brown is an 80 y.o. female.   Chief Complaint: hernia HPI: 80 yo female with periumbilical hernia with abdominal pain.  Past Medical History:  Diagnosis Date  . Allergy   . Anxiety   . Arthritis   . Cancer (HCC)    hx of melanoma   . Colon polyps   . COPD (chronic obstructive pulmonary disease) ([place])   . Coronary artery disease   . Dyspnea    with exertion   . Edema    in lower extremties   . Emphysema of lung ([place])   . GERD (gastroesophageal reflux disease)   . H/O hiatal hernia   . Hyperlipidemia   . Hypertension   . Inverted nipple    bilateral, pt says that's normal  . Myocardial infarct, old 2008   no stent placed  . Osteoporosis   . Pneumonia    hx of   . 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 Stroke [facility])    TIA    Past Surgical History:  Procedure Laterality Date  . ABDOMINAL HYSTERECTOMY  1998  . 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 HERNIA REPAIR     umbilical, ventral  . INSERTION OF ILIAC STENT  2008    Family History  Problem Relation Age of Onset  . Hearing loss Father   . Hearing loss Maternal Aunt   . Hearing loss Maternal Grandmother   . Cancer Maternal Grandmother        colon  . Arthritis Mother   . Cancer Paternal Grandmother        colon  . Breast cancer Neg Hx    Social History:  reports that she quit smoking about 15 years ago. Her smoking use included cigarettes. She has a 66.00 pack-year smoking history. She has never used smokeless tobacco. She reports current alcohol use of about 2.0 standard drinks of alcohol per week. She reports that she does not use drugs.  Allergies:  Allergies  Allergen Reactions  . Prednisone Other (See Comments)    Leg pain,light headed  . Statins     Myalgias- per patient, she feels better off the medication    Medications Prior to Admission  Medication Sig Dispense Refill  . acetaminophen (TYLENOL) 500 MG  tablet Take 500 mg by mouth every 6 (six) hours as needed for mild pain or moderate pain.    Marland Kitchen albuterol (PROAIR HFA) 108 (90 Base) MCG/ACT inhaler Inhale 1-2 puffs into the lungs every 6 (six) hours as needed for wheezing or shortness of breath. 6.7 g 6  . ALPRAZolam (XANAX) 0.25 MG tablet Take 1 tablet (0.25 mg total) by mouth 2 (two) times daily as needed for anxiety. (Patient taking differently: Take 0.25 mg by mouth at bedtime. May take additional 0.25 mg twice a day as needed for anxity) 30 tablet 1  . amLODipine (NORVASC) 10 MG tablet Take 1 tablet (10 mg total) by mouth daily. 90 tablet 3  . aspirin 81 MG tablet Take 81 mg by mouth at bedtime.    . Calcium Citrate-Vitamin D (CITRACAL + D PO) Take 1 tablet by mouth 2 (two) times daily. 600 mg calcium    . citalopram (CELEXA) 20 MG tablet Take 1 tablet (20 mg total) by mouth daily. 90 tablet 3  . colesevelam (WELCHOL) 625 MG tablet TAKE 1 TABLET BY MOUTH 2 TIMES DAILY WITH A MEAL (Patient  taking differently: Take 625 mg by mouth 2 (two) times daily with a meal.) 180 tablet 3  . fenofibrate (TRICOR) 145 MG tablet Take 1 tablet (145 mg total) by mouth daily. 90 tablet 3  . furosemide (LASIX) 20 MG tablet TAKE 1 TABLET BY MOUTH DAILY FOR LEG EDEMA (Patient taking differently: Take 20 mg by mouth daily. May take and additional 20 mg a day if needed for Edema) 90 tablet 1  . gabapentin (NEURONTIN) 100 MG capsule TAKE 1 CAPSULE BY MOUTH THREE TIMES A DAY (Patient taking differently: Take 100 mg by mouth 3 (three) times daily.) 270 capsule 3  . Garlic Oil 3785 MG CAPS Take 1,000 mg by mouth daily.    . Lactobacillus (PROBIOTIC ACIDOPHILUS) CAPS Take 1 tablet by mouth daily.    Marland Kitchen loratadine (CLARITIN) 10 MG tablet TAKE 1 TABLET BY MOUTH EVERY DAY (Patient taking differently: Take 10 mg by mouth daily.) 90 tablet 3  . magnesium oxide (MAG-OX) 400 MG tablet TAKE 1 TABLET BY MOUTH TWICE A DAY (Patient taking differently: Take 400 mg by mouth 2 (two) times  daily.) 180 tablet 2  . metoprolol succinate (TOPROL-XL) 50 MG 24 hr tablet Take 1 tablet (50 mg total) by mouth 2 (two) times daily. 180 tablet 3  . mometasone (NASONEX) 50 MCG/ACT nasal spray PLACE 2 SPRAYS INTO THE NOSE DAILY. 51 each 3  . nystatin ointment (MYCOSTATIN) Apply 1 application topically 2 (two) times daily. (Patient taking differently: Apply 1 application topically daily as needed (Yeast under breast).) 30 g 0  . omeprazole (PRILOSEC) 20 MG capsule Take 1 capsule (20 mg total) by mouth daily. 90 capsule 3  . raloxifene (EVISTA) 60 MG tablet Take 1 tablet (60 mg total) by mouth daily. 90 tablet 3  . SPIRIVA RESPIMAT 2.5 MCG/ACT AERS 2 puff per day (Patient taking differently: Inhale 2 puffs into the lungs daily.) 12 g 3  . SYMBICORT 160-4.5 MCG/ACT inhaler 2 puffs bid (Patient taking differently: Inhale 2 puffs into the lungs 2 (two) times daily.) 30.6 each 3  . triamcinolone cream (KENALOG) 0.1 % Apply 1 application topically 2 (two) times daily. (Patient taking differently: Apply 1 application topically daily as needed (yeast infection under breast).) 30 g 0    No results found for this or any previous visit (from the past 48 hour(s)). No results found.  Review of Systems  Constitutional: Negative for chills and fever.  HENT: Negative for hearing loss.   Respiratory: Negative for cough.   Cardiovascular: Negative for chest pain and palpitations.  Gastrointestinal: Negative for abdominal pain, nausea and vomiting.  Genitourinary: Negative for dysuria and urgency.  Musculoskeletal: Negative for myalgias and neck pain.  Skin: Negative for rash.  Neurological: Negative for dizziness and headaches.  Hematological: Does not bruise/bleed easily.  Psychiatric/Behavioral: Negative for suicidal ideas.    Blood pressure (!) 168/79, pulse 75, temperature 98.2 F (36.8 C), temperature source Oral, resp. rate 18, height 5' (1.524 m), weight 72.6 kg, SpO2 93 %. Physical Exam Vitals  reviewed.  Constitutional:      Appearance: She is well-developed.  HENT:     Head: Normocephalic and atraumatic.  Eyes:     Conjunctiva/sclera: Conjunctivae normal.     Pupils: Pupils are equal, round, and reactive to light.  Cardiovascular:     Rate and Rhythm: Normal rate and regular rhythm.  Pulmonary:     Effort: Pulmonary effort is normal.     Breath sounds: Normal breath sounds.  Abdominal:  General: Bowel sounds are normal. There is no distension.     Palpations: Abdomen is soft.     Tenderness: There is no abdominal tenderness.  Musculoskeletal:        General: Normal range of motion.     Cervical back: Normal range of motion and neck supple.  Skin:    General: Skin is warm and dry.  Neurological:     Mental Status: She is alert and oriented to person, place, and time.  Psychiatric:        Behavior: Behavior normal.    Assessment/Plan 80 yo female with laxity at the repair with concern for recurrence. -lap incisional hernia repair -observation stay -ERAS protocol  Mickeal Skinner, MD 10/26/2020, 3:14 PM

## 2020-10-26 NOTE — Anesthesia Postprocedure Evaluation (Signed)
Anesthesia Post Note  Patient: Midwife  Procedure(s) Performed: DIAGNOSTIC LAPAROSCOPY MESH EXPLANTATION; SMALL BOWEL RESECTION; PRIMARY REPAIR OF INCISIONAL HERNIA (N/A Abdomen)     Patient location during evaluation: PACU Anesthesia Type: General Level of consciousness: awake and alert and oriented Pain management: pain level controlled Vital Signs Assessment: post-procedure vital signs reviewed and stable Respiratory status: spontaneous breathing, nonlabored ventilation and respiratory function stable Cardiovascular status: blood pressure returned to baseline and stable Postop Assessment: no apparent nausea or vomiting Anesthetic complications: no   No complications documented.  Last Vitals:  Vitals:   10/26/20 1845 10/26/20 1858  BP: 113/80   Pulse: (!) 55 (!) 55  Resp: 13 11  Temp:    SpO2: 93% 91%    Last Pain:  Vitals:   10/26/20 1830  TempSrc:   PainSc: 8                  Tiberius Loftus A.

## 2020-10-26 NOTE — Anesthesia Preprocedure Evaluation (Signed)
Anesthesia Evaluation  Patient identified by MRN, date of birth, ID band Patient awake    Reviewed: Allergy & Precautions, NPO status , Patient's Chart, lab work & pertinent test results, reviewed documented beta blocker date and time   Airway Mallampati: III  TM Distance: >3 FB Neck ROM: Full    Dental  (+) Caps, Missing, Dental Advisory Given   Pulmonary shortness of breath, with exertion and at rest, pneumonia, resolved, COPD,  COPD inhaler, former smoker,    Pulmonary exam normal breath sounds clear to auscultation       Cardiovascular hypertension, Pt. on medications + CAD, + Past MI and + Peripheral Vascular Disease  Normal cardiovascular exam Rhythm:Regular Rate:Normal  EKG 09/26/20 NSR, Normal  Echo 02/10/20 1. Left ventricular ejection fraction, by estimation, is 55 to 60%. The left ventricle has normal function. The left ventricle has no regional wall motion abnormalities. There is moderate concentric left ventricular hypertrophy. Left ventricular diastolic parameters are consistent with Grade I diastolic dysfunction (impaired relaxation).  2. Right ventricular systolic function is normal. The right ventricular size is normal. There is normal pulmonary artery systolic pressure.  3. Right atrial size was mildly dilated.  4. The mitral valve is normal in structure. No evidence of mitral valve regurgitation. No evidence of mitral stenosis.  5. The aortic valve is normal in structure. Aortic valve regurgitation is not visualized. No aortic stenosis is present.  6. The inferior vena cava is normal in size with greater than 50% respiratory variability, suggesting right atrial pressure of 3 mmHg.    Neuro/Psych PSYCHIATRIC DISORDERS Anxiety  Neuromuscular disease CVA, No Residual Symptoms    GI/Hepatic Neg liver ROS, hiatal hernia, GERD  Medicated and Controlled,  Endo/Other  Obesity Hyperlipidemia  Renal/GU negative Renal  ROS  negative genitourinary   Musculoskeletal  (+) Arthritis , Osteoarthritis,  Chronic low back pain with sciatica Incisional hernia   Abdominal (+) + obese,   Peds  Hematology   Anesthesia Other Findings   Reproductive/Obstetrics                             Anesthesia Physical Anesthesia Plan  ASA: III  Anesthesia Plan: General   Post-op Pain Management:    Induction: Intravenous  PONV Risk Score and Plan: 4 or greater and Treatment may vary due to age or medical condition, Ondansetron and Dexamethasone  Airway Management Planned: Oral ETT  Additional Equipment: None  Intra-op Plan:   Post-operative Plan: Extubation in OR  Informed Consent: I have reviewed the patients History and Physical, chart, labs and discussed the procedure including the risks, benefits and alternatives for the proposed anesthesia with the patient or authorized representative who has indicated his/her understanding and acceptance.     Dental advisory given  Plan Discussed with: CRNA and Anesthesiologist  Anesthesia Plan Comments:         Anesthesia Quick Evaluation

## 2020-10-27 ENCOUNTER — Encounter (HOSPITAL_COMMUNITY): Payer: Self-pay | Admitting: General Surgery

## 2020-10-27 ENCOUNTER — Other Ambulatory Visit: Payer: Self-pay

## 2020-10-27 LAB — BASIC METABOLIC PANEL
Anion gap: 7 (ref 5–15)
BUN: 24 mg/dL — ABNORMAL HIGH (ref 8–23)
CO2: 23 mmol/L (ref 22–32)
Calcium: 7.9 mg/dL — ABNORMAL LOW (ref 8.9–10.3)
Chloride: 101 mmol/L (ref 98–111)
Creatinine, Ser: 1.46 mg/dL — ABNORMAL HIGH (ref 0.44–1.00)
GFR, Estimated: 36 mL/min — ABNORMAL LOW (ref >60)
Glucose, Bld: 146 mg/dL — ABNORMAL HIGH (ref 70–99)
Potassium: 4.5 mmol/L (ref 3.5–5.1)
Sodium: 131 mmol/L — ABNORMAL LOW (ref 135–145)

## 2020-10-27 LAB — CBC
HCT: 40.1 % (ref 36.0–46.0)
Hemoglobin: 12.3 g/dL (ref 12.0–15.0)
MCH: 28.6 pg (ref 26.0–34.0)
MCHC: 30.7 g/dL (ref 30.0–36.0)
MCV: 93.3 fL (ref 80.0–100.0)
Platelets: 278 10*3/uL (ref 150–400)
RBC: 4.3 MIL/uL (ref 3.87–5.11)
RDW: 15 % (ref 11.5–15.5)
WBC: 15.6 10*3/uL — ABNORMAL HIGH (ref 4.0–10.5)
nRBC: 0 % (ref 0.0–0.2)

## 2020-10-27 MED ORDER — TIOTROPIUM BROMIDE MONOHYDRATE 2.5 MCG/ACT IN AERS: 2.0000 | INHALATION_SPRAY | Freq: Every day | RESPIRATORY_TRACT | Status: DC

## 2020-10-27 MED ORDER — LORATADINE 10 MG PO TABS
10.0000 mg | ORAL_TABLET | Freq: Every day | ORAL | Status: DC
  Administered 2020-10-27 – 2020-10-30 (×4): 10 mg via ORAL
  Filled 2020-10-27 (×4): qty 1

## 2020-10-27 MED ORDER — PANTOPRAZOLE SODIUM 40 MG IV SOLR
40.0000 mg | Freq: Every day | INTRAVENOUS | Status: DC
  Administered 2020-10-27 – 2020-10-29 (×3): 40 mg via INTRAVENOUS
  Filled 2020-10-27 (×3): qty 40

## 2020-10-27 MED ORDER — ALPRAZOLAM 0.25 MG PO TABS
0.2500 mg | ORAL_TABLET | Freq: Two times a day (BID) | ORAL | Status: DC | PRN
  Administered 2020-10-29: 0.25 mg via ORAL
  Filled 2020-10-27: qty 1

## 2020-10-27 MED ORDER — METOPROLOL SUCCINATE ER 50 MG PO TB24
50.0000 mg | ORAL_TABLET | Freq: Two times a day (BID) | ORAL | Status: DC
  Administered 2020-10-27 – 2020-10-30 (×7): 50 mg via ORAL
  Filled 2020-10-27 (×7): qty 1

## 2020-10-27 MED ORDER — ALBUTEROL SULFATE HFA 108 (90 BASE) MCG/ACT IN AERS
1.0000 | INHALATION_SPRAY | Freq: Four times a day (QID) | RESPIRATORY_TRACT | Status: DC | PRN
  Filled 2020-10-27: qty 6.7

## 2020-10-27 MED ORDER — CITALOPRAM HYDROBROMIDE 20 MG PO TABS
20.0000 mg | ORAL_TABLET | Freq: Every day | ORAL | Status: DC
  Administered 2020-10-27 – 2020-10-30 (×4): 20 mg via ORAL
  Filled 2020-10-27 (×4): qty 1

## 2020-10-27 MED ORDER — UMECLIDINIUM BROMIDE 62.5 MCG/INH IN AEPB
1.0000 | INHALATION_SPRAY | Freq: Every day | RESPIRATORY_TRACT | Status: DC
  Administered 2020-10-27 – 2020-10-30 (×4): 1 via RESPIRATORY_TRACT
  Filled 2020-10-27: qty 7

## 2020-10-27 MED ORDER — AMLODIPINE BESYLATE 10 MG PO TABS
10.0000 mg | ORAL_TABLET | Freq: Every day | ORAL | Status: DC
Start: 2020-10-27 — End: 2020-10-30
  Administered 2020-10-27 – 2020-10-30 (×4): 10 mg via ORAL
  Filled 2020-10-27 (×4): qty 1

## 2020-10-27 MED ORDER — COLESEVELAM HCL 625 MG PO TABS
625.0000 mg | ORAL_TABLET | Freq: Two times a day (BID) | ORAL | Status: DC
  Administered 2020-10-27 – 2020-10-30 (×7): 625 mg via ORAL
  Filled 2020-10-27 (×8): qty 1

## 2020-10-27 MED ORDER — GABAPENTIN 100 MG PO CAPS
100.0000 mg | ORAL_CAPSULE | Freq: Three times a day (TID) | ORAL | Status: DC
  Administered 2020-10-27 – 2020-10-30 (×10): 100 mg via ORAL
  Filled 2020-10-27 (×10): qty 1

## 2020-10-27 MED ORDER — MOMETASONE FURO-FORMOTEROL FUM 200-5 MCG/ACT IN AERO
2.0000 | INHALATION_SPRAY | Freq: Two times a day (BID) | RESPIRATORY_TRACT | Status: DC
  Administered 2020-10-27 – 2020-10-30 (×6): 2 via RESPIRATORY_TRACT
  Filled 2020-10-27: qty 8.8

## 2020-10-27 NOTE — Progress Notes (Signed)
Patient is due to void at 2 am and was un able to void,3 people bladder scanned the pt but was unsuccessful to locate the bladder, paged on call surgeon Dr. Brantley Stage and with verbal order to do in and out cath.Patient tolerated the procedure, 100cc of amber urine was collected. We will continue to monitor.

## 2020-10-27 NOTE — Progress Notes (Signed)
1 Day Post-Op   Subjective/Chief Complaint: Complains of soreness. No flatus yet   Objective: Vital signs in last 24 hours: Temp:  [97.4 F (36.3 C)-98.2 F (36.8 C)] 97.7 F (36.5 C) (04/02 0915) Pulse Rate:  [54-75] 69 (04/02 0915) Resp:  [9-18] 16 (04/02 0915) BP: (95-168)/(49-80) 124/51 (04/02 0915) SpO2:  [82 %-99 %] 95 % (04/02 0915) Weight:  [72.6 kg] 72.6 kg (04/01 1314) Last BM Date: 10/26/20  Intake/Output from previous day: 04/01 0701 - 04/02 0700 In: 1240 [P.O.:240; I.V.:1000] Out: 350 [Urine:250; Blood:100] Intake/Output this shift: Total I/O In: 460 [P.O.:460] Out: 600 [Urine:600]  General appearance: alert and cooperative Resp: clear to auscultation bilaterally Cardio: regular rate and rhythm GI: soft, appropriately tender. incisioin ok  Lab Results:  Recent Labs    10/26/20 2020 10/27/20 0444  WBC 14.8* 15.6*  HGB 13.5 12.3  HCT 42.6 40.1  PLT 319 278   BMET Recent Labs    10/26/20 2020 10/27/20 0444  NA  --  131*  K  --  4.5  CL  --  101  CO2  --  23  GLUCOSE  --  146*  BUN  --  24*  CREATININE 1.22* 1.46*  CALCIUM  --  7.9*   PT/INR No results for input(s): LABPROT, INR in the last 72 hours. ABG No results for input(s): PHART, HCO3 in the last 72 hours.  Invalid input(s): PCO2, PO2  Studies/Results: No results found.  Anti-infectives: Anti-infectives (From admission, onward)   Start     Dose/Rate Route Frequency Ordered Stop   10/26/20 1315  ceFAZolin (ANCEF) IVPB 2g/100 mL premix        2 g 200 mL/hr over 30 Minutes Intravenous On call to O.R. 10/26/20 1256 10/26/20 1613      Assessment/Plan: s/p Procedure(s): DIAGNOSTIC LAPAROSCOPY MESH EXPLANTATION; SMALL BOWEL RESECTION; PRIMARY REPAIR OF INCISIONAL HERNIA (N/A) continue clears until bowel function returns  ambulate  LOS: 1 day    Autumn Messing III 10/27/2020

## 2020-10-28 NOTE — Progress Notes (Signed)
2 Days Post-Op   Subjective/Chief Complaint: No complaints. Feels better. No flatus or bm yet   Objective: Vital signs in last 24 hours: Temp:  [97.6 F (36.4 C)-98 F (36.7 C)] 98 F (36.7 C) (04/03 0532) Pulse Rate:  [62-67] 64 (04/03 0532) Resp:  [14-16] 14 (04/03 0532) BP: (120-139)/(56-71) 139/62 (04/03 0532) SpO2:  [89 %-96 %] 93 % (04/03 0532) Last BM Date: 10/26/20  Intake/Output from previous day: 04/02 0701 - 04/03 0700 In: 1303 [P.O.:820; I.V.:483] Out: 1200 [Urine:1200] Intake/Output this shift: No intake/output data recorded.  General appearance: alert and cooperative Resp: clear to auscultation bilaterally Cardio: regular rate and rhythm GI: soft, minimal tenderness. few bs  Lab Results:  Recent Labs    10/26/20 2020 10/27/20 0444  WBC 14.8* 15.6*  HGB 13.5 12.3  HCT 42.6 40.1  PLT 319 278   BMET Recent Labs    10/26/20 2020 10/27/20 0444  NA  --  131*  K  --  4.5  CL  --  101  CO2  --  23  GLUCOSE  --  146*  BUN  --  24*  CREATININE 1.22* 1.46*  CALCIUM  --  7.9*   PT/INR No results for input(s): LABPROT, INR in the last 72 hours. ABG No results for input(s): PHART, HCO3 in the last 72 hours.  Invalid input(s): PCO2, PO2  Studies/Results: No results found.  Anti-infectives: Anti-infectives (From admission, onward)   Start     Dose/Rate Route Frequency Ordered Stop   10/26/20 1315  ceFAZolin (ANCEF) IVPB 2g/100 mL premix        2 g 200 mL/hr over 30 Minutes Intravenous On call to O.R. 10/26/20 1256 10/26/20 1613      Assessment/Plan: s/p Procedure(s): DIAGNOSTIC LAPAROSCOPY MESH EXPLANTATION; SMALL BOWEL RESECTION; PRIMARY REPAIR OF INCISIONAL HERNIA (N/A) stay on clears until bowel function returns  ambulate  LOS: 2 days    Courtney Brown 10/28/2020

## 2020-10-29 LAB — CREATININE, SERUM
Creatinine, Ser: 0.98 mg/dL (ref 0.44–1.00)
GFR, Estimated: 59 mL/min — ABNORMAL LOW (ref >60)

## 2020-10-29 NOTE — Care Management Important Message (Signed)
Important Message  Patient Details IM Letter given to the Patient Name: Courtney Brown MRN: [id number] Date of Birth: 1940-10-24   Medicare Important Message Given:  Yes     Kerin Salen 10/29/2020, 1:36 PM

## 2020-10-29 NOTE — Progress Notes (Signed)
Pt ambulated in the hall without oxygen and her O2 sats were 86-87%.

## 2020-10-29 NOTE — Progress Notes (Signed)
Progress Note: General Surgery Service   Chief Complaint/Subjective: Tolerating liquids, +flatus overnight, ambulating well, soreness improving  Objective: Vital signs in last 24 hours: Temp:  [98.2 F (36.8 C)-98.5 F (36.9 C)] 98.3 F (36.8 C) (04/04 0558) Pulse Rate:  [63-68] 65 (04/04 0558) Resp:  [16-18] 17 (04/04 0558) BP: (125-151)/(56-64) 136/56 (04/04 0558) SpO2:  [91 %-94 %] 94 % (04/04 0558) Last BM Date: 10/26/20  Intake/Output from previous day: 04/03 0701 - 04/04 0700 In: 76.4 [I.V.:76.4] Out: 1900 [Urine:1900] Intake/Output this shift: No intake/output data recorded.  Gen: NAD  Resp: nonlabored  Card: RRR  Abd: soft, incisions c/d/i no erythema  Lab Results: CBC  Recent Labs    10/26/20 2020 10/27/20 0444  WBC 14.8* 15.6*  HGB 13.5 12.3  HCT 42.6 40.1  PLT 319 278   BMET Recent Labs    10/27/20 0444 10/29/20 0439  NA 131*  --   K 4.5  --   CL 101  --   CO2 23  --   GLUCOSE 146*  --   BUN 24*  --   CREATININE 1.46* 0.98  CALCIUM 7.9*  --    PT/INR No results for input(s): LABPROT, INR in the last 72 hours. ABG No results for input(s): PHART, HCO3 in the last 72 hours.  Invalid input(s): PCO2, PO2  Anti-infectives: Anti-infectives (From admission, onward)   Start     Dose/Rate Route Frequency Ordered Stop   10/26/20 1315  ceFAZolin (ANCEF) IVPB 2g/100 mL premix        2 g 200 mL/hr over 30 Minutes Intravenous On call to O.R. 10/26/20 1256 10/26/20 1613      Medications: Scheduled Meds: . amLODipine  10 mg Oral Daily  . citalopram  20 mg Oral Daily  . colesevelam  625 mg Oral BID WC  . enoxaparin (LOVENOX) injection  40 mg Subcutaneous Q24H  . gabapentin  100 mg Oral TID  . loratadine  10 mg Oral Daily  . metoprolol succinate  50 mg Oral BID  . mometasone-formoterol  2 puff Inhalation BID  . pantoprazole (PROTONIX) IV  40 mg Intravenous Daily  . umeclidinium bromide  1 puff Inhalation Daily   Continuous Infusions: .  lactated ringers 10 mL/hr at 10/28/20 1341   PRN Meds:.albuterol, ALPRAZolam, diphenhydrAMINE **OR** diphenhydrAMINE, morphine injection, ondansetron **OR** ondansetron (ZOFRAN) IV, oxyCODONE, traMADol  Assessment/Plan: s/p Procedure(s): DIAGNOSTIC LAPAROSCOPY MESH EXPLANTATION; SMALL BOWEL RESECTION; PRIMARY REPAIR OF INCISIONAL HERNIA 10/26/2020 -DM diet -continue ambulate -saline locked -IS/pulm toilet -assess need for oxygen -possible dc tomorrow    LOS: 3 days   Mickeal Skinner, MD [facility]

## 2020-10-30 MED ORDER — PANTOPRAZOLE SODIUM 40 MG PO TBEC
40.0000 mg | DELAYED_RELEASE_TABLET | Freq: Every day | ORAL | Status: DC
  Administered 2020-10-30: 40 mg via ORAL
  Filled 2020-10-30: qty 1

## 2020-10-30 MED ORDER — OXYCODONE HCL 5 MG PO TABS: 5.0000 mg | ORAL_TABLET | Freq: Four times a day (QID) | ORAL | 0 refills | Status: DC | PRN

## 2020-10-30 NOTE — Discharge Instructions (Signed)
[facility] _______[facility] ° °UMBILICAL OR INGUINAL HERNIA REPAIR: POST OP INSTRUCTIONS ° °Always review your discharge instruction sheet given to you by the facility where your surgery was performed. °IF YOU HAVE DISABILITY OR FAMILY LEAVE FORMS, YOU MUST BRING THEM TO THE OFFICE FOR PROCESSING.   °DO NOT GIVE THEM TO YOUR DOCTOR. ° °1. A  prescription for pain medication may be given to you upon discharge.  Take your pain medication as prescribed, if needed.  If narcotic pain medicine is not needed, then you may take acetaminophen (Tylenol) or ibuprofen (Advil) as needed. °2. Take your usually prescribed medications unless otherwise directed. °If you need a refill on your pain medication, please contact your pharmacy.  They will contact our office to request authorization. Prescriptions will not be filled after 5 pm or on week-ends. °3. You should follow a light diet the first 24 hours after arrival home, such as soup and crackers, etc.  Be sure to include lots of fluids daily.  Resume your normal diet the day after surgery. °4.Most patients will experience some swelling and bruising around the umbilicus or in the groin and scrotum.  Ice packs and reclining will help.  Swelling and bruising can take several days to resolve.  °6. It is common to experience some constipation if taking pain medication after surgery.  Increasing fluid intake and taking a stool softener (such as Colace) will usually help or prevent this problem from occurring.  A mild laxative (Milk of Magnesia or Miralax) should be taken according to package directions if there are no bowel movements after 48 hours. °7. Unless discharge instructions indicate otherwise, you may remove your bandages 24-48 hours after surgery, and you may shower at that time.  You may have steri-strips (small skin tapes) in place directly over the incision.  These strips should be left on the skin for 7-10 days.  If your surgeon used skin glue on the  incision, you may shower in 24 hours.  The glue will flake off over the next 2-3 weeks.  Any sutures or staples will be removed at the office during your follow-up visit. °8. ACTIVITIES:  You may resume regular (light) daily activities beginning the next day--such as daily self-care, walking, climbing stairs--gradually increasing activities as tolerated.  You may have sexual intercourse when it is comfortable.  Refrain from any heavy lifting or straining until approved by your doctor. ° °a.You may drive when you are no longer taking prescription pain medication, you can comfortably wear a seatbelt, and you can safely maneuver your car and apply brakes. °b.RETURN TO WORK:   °_____________________________________________ ° °9.You should see your doctor in the office for a follow-up appointment approximately 2-3 weeks after your surgery.  Make sure that you call for this appointment within a day or two after you arrive home to insure a convenient appointment time. °10.OTHER INSTRUCTIONS: _________________________ °   _____________________________________ ° °WHEN TO CALL YOUR DOCTOR: °1. Fever over 101.0 °2. Inability to urinate °3. Nausea and/or vomiting °4. Extreme swelling or bruising °5. Continued bleeding from incision. °6. Increased pain, redness, or drainage from the incision ° °The clinic staff is available to answer your questions during regular business hours.  Please don’t hesitate to call and ask to speak to one of the nurses for clinical concerns.  If you have a medical emergency, go to the nearest emergency room or call 911.  A surgeon from [facility] is always on call at the hospital ° ° °  [address], [address], [place], [place]  [address] ? ° [address] [address], [place], [place]   [address] °[phone] ? [phone] ? FAX [phone] °Web site: [email or web address] °

## 2020-10-30 NOTE — Progress Notes (Signed)
Discharge instructions discussed with patient, verbalized agreement and understanding 

## 2020-10-30 NOTE — Discharge Summary (Signed)
Physician Discharge Summary  Courtney Brown BDZ:[id number] DOB: 1941-04-22 DOA: 10/26/2020  PCP: [name], FNP  Admit date: 10/26/2020 Discharge date: 10/30/2020  Recommendations for Outpatient Follow-up:  1.  (include homehealth, outpatient follow-up instructions, specific recommendations for PCP to follow-up on, etc.)   Follow-up Information    [name], [name], MD Follow up in 3 week(s).   Specialty: General Surgery Contact information: [place] [address] [phone]              Discharge Diagnoses:  Active Problems:   Incisional hernia   Surgical Procedure: small bowel resection, primary incisional hernia repair  Discharge Condition: Good Disposition: Home  Diet recommendation: carb modified diet   Hospital Course:  80 yo female underwent incisional hernia repair. During the case she additionally had a small intestine resection. Post op she was admitted to the surgical floor. She was slowly advanced for diet over 4 days. She initially had a small bump in creatinine which improved over the following days to baseline. She ambulated well. It took 3 days to wean O2 nasal cannula and did not require it when walking by time of discharge. She had a BM POD 4 and was discharged home POD 4.  Discharge Instructions  Discharge Instructions    Call MD for:  difficulty breathing, headache or visual disturbances   Complete by: As directed    Call MD for:  persistant nausea and vomiting   Complete by: As directed    Call MD for:  redness, tenderness, or signs of infection (pain, swelling, redness, odor or green/yellow discharge around incision site)   Complete by: As directed    Call MD for:  severe uncontrolled pain   Complete by: As directed    Call MD for:  temperature >100.4   Complete by: As directed    Diet - low sodium heart healthy   Complete by: As directed    Increase activity slowly   Complete by: As directed       Allergies as of 10/30/2020      Reactions   Prednisone Other (See Comments)   Leg pain,light headed   Statins    Myalgias- per patient, she feels better off the medication      Medication List    TAKE these medications   acetaminophen 500 MG tablet Commonly known as: TYLENOL Take 500 mg by mouth every 6 (six) hours as needed for mild pain or moderate pain.   albuterol 108 (90 Base) MCG/ACT inhaler Commonly known as: ProAir HFA Inhale 1-2 puffs into the lungs every 6 (six) hours as needed for wheezing or shortness of breath.   ALPRAZolam 0.25 MG tablet Commonly known as: XANAX Take 1 tablet (0.25 mg total) by mouth 2 (two) times daily as needed for anxiety. What changed:   when to take this  additional instructions   amLODipine 10 MG tablet Commonly known as: NORVASC Take 1 tablet (10 mg total) by mouth daily.   aspirin 81 MG tablet Take 81 mg by mouth at bedtime.   citalopram 20 MG tablet Commonly known as: CELEXA Take 1 tablet (20 mg total) by mouth daily.   CITRACAL + D PO Take 1 tablet by mouth 2 (two) times daily. 600 mg calcium   colesevelam 625 MG tablet Commonly known as: WELCHOL TAKE 1 TABLET BY MOUTH 2 TIMES DAILY WITH A MEAL What changed:   how much to take  how to take this  when to take  this  additional instructions   fenofibrate 145 MG tablet Commonly known as: TRICOR Take 1 tablet (145 mg total) by mouth daily.   furosemide 20 MG tablet Commonly known as: LASIX TAKE 1 TABLET BY MOUTH DAILY FOR LEG EDEMA What changed:   how much to take  how to take this  when to take this  additional instructions   gabapentin 100 MG capsule Commonly known as: NEURONTIN TAKE 1 CAPSULE BY MOUTH THREE TIMES A DAY What changed:   how much to take  how to take this  when to take this  additional instructions   Garlic Oil 0272 MG Caps Take 1,000 mg by mouth daily.   loratadine 10 MG tablet Commonly known as: CLARITIN TAKE 1 TABLET BY  MOUTH EVERY DAY   magnesium oxide 400 MG tablet Commonly known as: MAG-OX TAKE 1 TABLET BY MOUTH TWICE A DAY What changed:   how much to take  how to take this  when to take this  additional instructions   metoprolol succinate 50 MG 24 hr tablet Commonly known as: TOPROL-XL Take 1 tablet (50 mg total) by mouth 2 (two) times daily.   mometasone 50 MCG/ACT nasal spray Commonly known as: NASONEX PLACE 2 SPRAYS INTO THE NOSE DAILY.   nystatin ointment Commonly known as: MYCOSTATIN Apply 1 application topically 2 (two) times daily. What changed:   when to take this  reasons to take this   omeprazole 20 MG capsule Commonly known as: PRILOSEC Take 1 capsule (20 mg total) by mouth daily.   oxyCODONE 5 MG immediate release tablet Commonly known as: Oxy IR/ROXICODONE Take 1 tablet (5 mg total) by mouth every 6 (six) hours as needed for severe pain.   Probiotic Acidophilus Caps Take 1 tablet by mouth daily.   raloxifene 60 MG tablet Commonly known as: EVISTA Take 1 tablet (60 mg total) by mouth daily.   Spiriva Respimat 2.5 MCG/ACT Aers Generic drug: Tiotropium Bromide Monohydrate 2 puff per day What changed:   how much to take  how to take this  when to take this  additional instructions   Symbicort 160-4.5 MCG/ACT inhaler Generic drug: budesonide-formoterol 2 puffs bid What changed:   how much to take  how to take this  when to take this  additional instructions   triamcinolone 0.1 % Commonly known as: KENALOG Apply 1 application topically 2 (two) times daily. What changed:   when to take this  reasons to take this       Follow-up Information    [name], [name], MD Follow up in 3 week(s).   Specialty: General Surgery Contact information: [place] [address] [phone]                The results of significant diagnostics from this hospitalization (including imaging, microbiology, ancillary  and laboratory) are listed below for reference.    Significant Diagnostic Studies: No results found.  Labs: Basic Metabolic Panel: Recent Labs  Lab 10/26/20 2020 10/27/20 0444 10/29/20 0439  NA  --  131*  --   K  --  4.5  --   CL  --  101  --   CO2  --  23  --   GLUCOSE  --  146*  --   BUN  --  24*  --   CREATININE 1.22* 1.46* 0.98  CALCIUM  --  7.9*  --    Liver Function Tests: No results for input(s): AST, ALT,  ALKPHOS, BILITOT, PROT, ALBUMIN in the last 168 hours.  CBC: Recent Labs  Lab 10/26/20 2020 10/27/20 0444  WBC 14.8* 15.6*  HGB 13.5 12.3  HCT 42.6 40.1  MCV 94.0 93.3  PLT 319 278    CBG: No results for input(s): GLUCAP in the last 168 hours.  Active Problems:   Incisional hernia   Time coordinating discharge: 15 min

## 2020-10-31 LAB — SURGICAL PATHOLOGY

## 2020-11-06 ENCOUNTER — Ambulatory Visit: Payer: Self-pay | Admitting: General Surgery

## 2020-11-12 ENCOUNTER — Telehealth: Payer: Self-pay | Admitting: Family

## 2020-11-12 DIAGNOSIS — F4323 Adjustment disorder with mixed anxiety and depressed mood: Secondary | ICD-10-CM

## 2020-11-12 MED ORDER — ALPRAZOLAM 0.25 MG PO TABS: 0.2500 mg | ORAL_TABLET | Freq: Two times a day (BID) | ORAL | 1 refills | Status: DC | PRN

## 2020-11-12 NOTE — Telephone Encounter (Signed)
1.Medication Requested: ALPRAZolam (XANAX) 0.25 MG tablet    2. Pharmacy (Name, Street, [place]): [id number] [place], [place]  3. On Med List: yes   4. Last Visit with PCP: 07-10-20  5. Next visit date with PCP: n/a    Agent: Please be advised that RX refills may take up to 3 business days. We ask that you follow-up with your pharmacy.

## 2020-11-12 NOTE — Telephone Encounter (Signed)
I have attempted to call pt and get her scheduled. No answer so I left a message to call back.

## 2020-11-12 NOTE — Telephone Encounter (Signed)
I am okay to do her refill- in general, she is a once a year patient.  So if she wants to do TOC, it can be done in a few months/ not emergent to do right away.

## 2020-11-12 NOTE — Telephone Encounter (Signed)
I have attempted to call pt and ask if he was going to follow Mickel Baas to [facility] or stay at [facility]?   No answer so I left a message to call back.   Also pt is asking for med refill of Xanax .25 mg. Last seen was 07/10/20 and rx was sent on 09/07/20 w/ #30 +1.   Please advise on refill.

## 2020-12-11 ENCOUNTER — Other Ambulatory Visit: Payer: Self-pay | Admitting: Family

## 2020-12-11 DIAGNOSIS — J449 Chronic obstructive pulmonary disease, unspecified: Secondary | ICD-10-CM

## 2021-01-10 ENCOUNTER — Other Ambulatory Visit: Payer: Self-pay | Admitting: Family

## 2021-01-10 DIAGNOSIS — F4323 Adjustment disorder with mixed anxiety and depressed mood: Secondary | ICD-10-CM

## 2021-01-10 NOTE — Telephone Encounter (Signed)
1.Medication Requested: ALPRAZolam (XANAX) 0.25 MG tablet 2. Pharmacy (Name, Street, [place]): [id number] [place], [address] [place]. Phone:  [phone]  Fax:  [phone]     3. On Med List: Y    4. Last Visit with PCP: 07/17/2020  5. Next visit date with PCP: 01/30/2021   Agent: Please be advised that RX refills may take up to 3 business days. We ask that you follow-up with your pharmacy.   Patient is scheduled for a transfer of care on 01/30/2021 with Dr.Jones so I was unsure who to send this too.   Please advise.

## 2021-01-10 NOTE — Telephone Encounter (Signed)
She would not be eligible for the Rx until her OV with Jones. Will defer to former PCP and CMA.

## 2021-01-11 MED ORDER — ALPRAZOLAM 0.25 MG PO TABS
0.2500 mg | ORAL_TABLET | Freq: Two times a day (BID) | ORAL | 0 refills | Status: DC | PRN
Start: 2021-01-11 — End: 2021-02-13

## 2021-01-11 NOTE — Telephone Encounter (Signed)
Patient is requesting a refill of the following medications: Requested Prescriptions   Pending Prescriptions Disp Refills   ALPRAZolam (XANAX) 0.25 MG tablet 30 tablet 0    Sig: Take 1 tablet (0.25 mg total) by mouth 2 (two) times daily as needed for anxiety.    Date of patient request: 01/11/21 Last office visit: 07/10/20 with Mickel Baas CPE Date of last refill: 11/12/20 Last refill amount: 30 + 1 Follow up time period per chart: Next F/u w/ JONES on 02/02/21

## 2021-01-11 NOTE — Addendum Note (Signed)
Addended by: Kittie Plater, Caridad Silveira HUA on: 01/11/2021 09:05 AM   Modules accepted: Orders

## 2021-01-17 ENCOUNTER — Ambulatory Visit: Payer: Medicare Other

## 2021-01-30 ENCOUNTER — Encounter: Payer: Self-pay | Admitting: Internal Medicine

## 2021-01-30 ENCOUNTER — Ambulatory Visit (INDEPENDENT_AMBULATORY_CARE_PROVIDER_SITE_OTHER): Payer: Medicare Other | Admitting: Internal Medicine

## 2021-01-30 ENCOUNTER — Other Ambulatory Visit: Payer: Self-pay

## 2021-01-30 VITALS — BP 128/76 | HR 63 | Temp 98.3°F | Ht 60.0 in | Wt 161.0 lb

## 2021-01-30 DIAGNOSIS — R7303 Prediabetes: Secondary | ICD-10-CM | POA: Insufficient documentation

## 2021-01-30 DIAGNOSIS — B0229 Other postherpetic nervous system involvement: Secondary | ICD-10-CM

## 2021-01-30 DIAGNOSIS — R739 Hyperglycemia, unspecified: Secondary | ICD-10-CM

## 2021-01-30 DIAGNOSIS — I10 Essential (primary) hypertension: Secondary | ICD-10-CM

## 2021-01-30 DIAGNOSIS — N1832 Chronic kidney disease, stage 3b: Secondary | ICD-10-CM | POA: Diagnosis not present

## 2021-01-30 MED ORDER — GABAPENTIN 100 MG PO CAPS: 100.0000 mg | ORAL_CAPSULE | Freq: Two times a day (BID) | ORAL | 1 refills | Status: DC

## 2021-01-30 NOTE — Progress Notes (Signed)
Subjective:  Patient ID: Courtney Brown, female    DOB: 1940/12/27  Age: 80 y.o. MRN: [id number]  CC: COPD and Hypertension  This visit occurred during the SARS-CoV-2 public health emergency.  Safety protocols were in place, including screening questions prior to the visit, additional usage of staff PPE, and extensive cleaning of exam room while observing appropriate contact time as indicated for disinfecting solutions.    HPI Courtney Brown presents for f/up and to establish.  She tells me her blood pressure is well controlled.  She is active and denies any recent episodes of chest pain, diaphoresis, or edema.  She has chronic shortness of breath related to COPD.  She has a history of postherpetic neuralgia over the left flank.  She would like to decrease her gabapentin dose to twice a day.  Outpatient Medications Prior to Visit  Medication Sig Dispense Refill   acetaminophen (TYLENOL) 500 MG tablet Take 500 mg by mouth every 6 (six) hours as needed for mild pain or moderate pain.     albuterol (VENTOLIN HFA) 108 (90 Base) MCG/ACT inhaler INHALE 1-2 PUFFS BY MOUTH EVERY 6 HOURS AS NEEDED FOR WHEEZE OR SHORTNESS OF BREATH 6.7 each 1   ALPRAZolam (XANAX) 0.25 MG tablet Take 1 tablet (0.25 mg total) by mouth 2 (two) times daily as needed for anxiety. 30 tablet 0   amLODipine (NORVASC) 10 MG tablet Take 1 tablet (10 mg total) by mouth daily. 90 tablet 3   aspirin 81 MG tablet Take 81 mg by mouth at bedtime.     Calcium Citrate-Vitamin D (CITRACAL + D PO) Take 1 tablet by mouth 2 (two) times daily. 600 mg calcium     citalopram (CELEXA) 20 MG tablet Take 1 tablet (20 mg total) by mouth daily. 90 tablet 3   colesevelam (WELCHOL) 625 MG tablet TAKE 1 TABLET BY MOUTH 2 TIMES DAILY WITH A MEAL (Patient taking differently: Take 625 mg by mouth 2 (two) times daily with a meal.) 180 tablet 3   fenofibrate (TRICOR) 145 MG tablet Take 1 tablet (145 mg total) by mouth daily. 90 tablet 3   furosemide (LASIX)  20 MG tablet TAKE 1 TABLET BY MOUTH DAILY FOR LEG EDEMA (Patient taking differently: Take 20 mg by mouth daily. May take and additional 20 mg a day if needed for Edema) 90 tablet 1   Garlic Oil 8937 MG CAPS Take 1,000 mg by mouth daily.     Lactobacillus (PROBIOTIC ACIDOPHILUS) CAPS Take 1 tablet by mouth daily.     loratadine (CLARITIN) 10 MG tablet TAKE 1 TABLET BY MOUTH EVERY DAY (Patient taking differently: Take 10 mg by mouth daily.) 90 tablet 3   magnesium oxide (MAG-OX) 400 MG tablet TAKE 1 TABLET BY MOUTH TWICE A DAY (Patient taking differently: Take 400 mg by mouth 2 (two) times daily.) 180 tablet 2   metoprolol succinate (TOPROL-XL) 50 MG 24 hr tablet Take 1 tablet (50 mg total) by mouth 2 (two) times daily. 180 tablet 3   mometasone (NASONEX) 50 MCG/ACT nasal spray PLACE 2 SPRAYS INTO THE NOSE DAILY. 51 each 3   omeprazole (PRILOSEC) 20 MG capsule Take 1 capsule (20 mg total) by mouth daily. 90 capsule 3   raloxifene (EVISTA) 60 MG tablet Take 1 tablet (60 mg total) by mouth daily. 90 tablet 3   SPIRIVA RESPIMAT 2.5 MCG/ACT AERS 2 puff per day (Patient taking differently: Inhale 2 puffs into the lungs daily.) 12 g 3   SYMBICORT 160-4.5 MCG/ACT  inhaler 2 puffs bid (Patient taking differently: Inhale 2 puffs into the lungs 2 (two) times daily.) 30.6 each 3   triamcinolone cream (KENALOG) 0.1 % Apply 1 application topically 2 (two) times daily. (Patient taking differently: Apply 1 application topically daily as needed (yeast infection under breast).) 30 g 0   gabapentin (NEURONTIN) 100 MG capsule TAKE 1 CAPSULE BY MOUTH THREE TIMES A DAY (Patient taking differently: Take 100 mg by mouth 3 (three) times daily.) 270 capsule 3   nystatin ointment (MYCOSTATIN) Apply 1 application topically 2 (two) times daily. (Patient taking differently: Apply 1 application topically daily as needed (Yeast under breast).) 30 g 0   oxyCODONE (OXY IR/ROXICODONE) 5 MG immediate release tablet Take 1 tablet (5 mg  total) by mouth every 6 (six) hours as needed for severe pain. 15 tablet 0   No facility-administered medications prior to visit.    ROS Review of Systems  Constitutional:  Negative for diaphoresis, fatigue and unexpected weight change.  HENT: Negative.    Respiratory:  Positive for shortness of breath. Negative for cough, chest tightness and wheezing.   Cardiovascular:  Negative for chest pain, palpitations and leg swelling.  Gastrointestinal:  Positive for abdominal distention. Negative for abdominal pain, constipation, nausea and vomiting.  Endocrine: Negative.   Genitourinary: Negative.  Negative for difficulty urinating and dysuria.  Musculoskeletal: Negative.   Skin: Negative.   Neurological: Negative.  Negative for dizziness, weakness, light-headedness and headaches.  Hematological:  Negative for adenopathy. Does not bruise/bleed easily.  Psychiatric/Behavioral: Negative.     Objective:  BP 128/76 (BP Location: Left Arm, Patient Position: Sitting, Cuff Size: Large)   Pulse 63   Temp 98.3 F (36.8 C) (Oral)   Ht 5' (1.524 m)   Wt 161 lb (73 kg)   SpO2 93%   BMI 31.44 kg/m   BP Readings from Last 3 Encounters:  01/30/21 128/76  10/30/20 (!) 137/55  10/19/20 (!) 137/59    Wt Readings from Last 3 Encounters:  01/30/21 161 lb (73 kg)  10/26/20 160 lb (72.6 kg)  09/26/20 161 lb (73 kg)    Physical Exam Vitals reviewed.  HENT:     Nose: Nose normal.     Mouth/Throat:     Mouth: Mucous membranes are moist.  Eyes:     Conjunctiva/sclera: Conjunctivae normal.  Cardiovascular:     Rate and Rhythm: Normal rate and regular rhythm.     Heart sounds: No murmur heard.   No gallop.  Pulmonary:     Effort: Pulmonary effort is normal.     Breath sounds: No stridor. No wheezing, rhonchi or rales.  Abdominal:     General: Abdomen is protuberant. Bowel sounds are normal. There is distension. There are no signs of injury.     Palpations: Abdomen is soft. There is no  hepatomegaly, splenomegaly or mass.     Tenderness: There is no abdominal tenderness. There is no guarding or rebound.     Hernia: A hernia is present. Hernia is present in the ventral area.  Musculoskeletal:        General: Normal range of motion.     Cervical back: Neck supple.     Right lower leg: No edema.     Left lower leg: No edema.  Lymphadenopathy:     Cervical: No cervical adenopathy.  Skin:    General: Skin is warm and dry.  Neurological:     General: No focal deficit present.     Mental Status: She  is alert.  Psychiatric:        Mood and Affect: Mood normal.        Behavior: Behavior normal.    Lab Results  Component Value Date   WBC 8.8 01/30/2021   HGB 13.0 01/30/2021   HCT 39.9 01/30/2021   PLT 351.0 01/30/2021   GLUCOSE 104 (H) 01/30/2021   CHOL 148 07/10/2020   TRIG 135.0 07/10/2020   HDL 46.80 07/10/2020   LDLCALC 74 07/10/2020   ALT 13 07/10/2020   AST 17 07/10/2020   NA 139 01/30/2021   K 3.9 01/30/2021   CL 101 01/30/2021   CREATININE 1.16 01/30/2021   BUN 21 01/30/2021   CO2 31 01/30/2021   TSH 3.42 01/30/2021   HGBA1C 6.0 01/30/2021    No results found.  Assessment & Plan:   Courtney Brown was seen today for copd and hypertension.  Diagnoses and all orders for this visit:  HTN (hypertension), benign- Her blood pressure is adequately well controlled. -     CBC with Differential/Platelet; Future -     Basic metabolic panel; Future -     TSH; Future -     TSH -     Basic metabolic panel -     CBC with Differential/Platelet  Chronic hyperglycemia- Her A1c is at 6.0%.  Medical therapy is not yet indicated. -     Basic metabolic panel; Future -     Hemoglobin A1c; Future -     Hemoglobin A1c -     Basic metabolic panel  Post herpetic neuralgia -     gabapentin (NEURONTIN) 100 MG capsule; Take 1 capsule (100 mg total) by mouth 2 (two) times daily.  Stage 3b chronic kidney disease ([place])- Her BP is adequately well controlled.  She will avoid  nephrotoxic agents.  I have discontinued Courtney Brown's nystatin ointment and oxyCODONE. I have also changed her gabapentin. Additionally, I am having her maintain her aspirin, Probiotic Acidophilus, acetaminophen, Garlic Oil, triamcinolone cream, Calcium Citrate-Vitamin D (CITRACAL + D PO), loratadine, amLODipine, citalopram, colesevelam, fenofibrate, metoprolol succinate, omeprazole, raloxifene, Spiriva Respimat, Symbicort, furosemide, magnesium oxide, mometasone, albuterol, and ALPRAZolam.  Meds ordered this encounter  Medications   gabapentin (NEURONTIN) 100 MG capsule    Sig: Take 1 capsule (100 mg total) by mouth 2 (two) times daily.    Dispense:  180 capsule    Refill:  1      Follow-up: Return in about 6 months (around 08/02/2021).  Courtney Calico, MD

## 2021-01-30 NOTE — Patient Instructions (Signed)

## 2021-01-31 ENCOUNTER — Other Ambulatory Visit: Payer: Self-pay | Admitting: General Surgery

## 2021-01-31 DIAGNOSIS — N1832 Chronic kidney disease, stage 3b: Secondary | ICD-10-CM | POA: Insufficient documentation

## 2021-01-31 DIAGNOSIS — B0229 Other postherpetic nervous system involvement: Secondary | ICD-10-CM | POA: Insufficient documentation

## 2021-01-31 DIAGNOSIS — K432 Incisional hernia without obstruction or gangrene: Secondary | ICD-10-CM

## 2021-01-31 LAB — BASIC METABOLIC PANEL
BUN: 21 mg/dL (ref 6–23)
CO2: 31 mEq/L (ref 19–32)
Calcium: 9 mg/dL (ref 8.4–10.5)
Chloride: 101 mEq/L (ref 96–112)
Creatinine, Ser: 1.16 mg/dL (ref 0.40–1.20)
GFR: 44.85 mL/min — ABNORMAL LOW (ref >60.00)
Glucose, Bld: 104 mg/dL — ABNORMAL HIGH (ref 70–99)
Potassium: 3.9 mEq/L (ref 3.5–5.1)
Sodium: 139 mEq/L (ref 135–145)

## 2021-01-31 LAB — CBC WITH DIFFERENTIAL/PLATELET
Basophils Absolute: 0.1 10*3/uL (ref 0.0–0.1)
Basophils Relative: 1.1 % (ref 0.0–3.0)
Eosinophils Absolute: 0.2 10*3/uL (ref 0.0–0.7)
Eosinophils Relative: 2.7 % (ref 0.0–5.0)
HCT: 39.9 % (ref 36.0–46.0)
Hemoglobin: 13 g/dL (ref 12.0–15.0)
Lymphocytes Relative: 18.1 % (ref 12.0–46.0)
Lymphs Abs: 1.6 10*3/uL (ref 0.7–4.0)
MCHC: 32.5 g/dL (ref 30.0–36.0)
MCV: 85.8 fl (ref 78.0–100.0)
Monocytes Absolute: 0.9 10*3/uL (ref 0.1–1.0)
Monocytes Relative: 10.2 % (ref 3.0–12.0)
Neutro Abs: 6 10*3/uL (ref 1.4–7.7)
Neutrophils Relative %: 67.9 % (ref 43.0–77.0)
Platelets: 351 10*3/uL (ref 150.0–400.0)
RBC: 4.65 Mil/uL (ref 3.87–5.11)
RDW: 14.9 % (ref 11.5–15.5)
WBC: 8.8 10*3/uL (ref 4.0–10.5)

## 2021-01-31 LAB — TSH: TSH: 3.42 u[IU]/mL (ref 0.35–5.50)

## 2021-01-31 LAB — HEMOGLOBIN A1C: Hgb A1c MFr Bld: 6 % (ref 4.6–6.5)

## 2021-02-13 ENCOUNTER — Telehealth: Payer: Self-pay | Admitting: Internal Medicine

## 2021-02-13 ENCOUNTER — Other Ambulatory Visit: Payer: Self-pay | Admitting: Internal Medicine

## 2021-02-13 DIAGNOSIS — F4323 Adjustment disorder with mixed anxiety and depressed mood: Secondary | ICD-10-CM

## 2021-02-13 DIAGNOSIS — F411 Generalized anxiety disorder: Secondary | ICD-10-CM

## 2021-02-13 MED ORDER — ALPRAZOLAM 0.25 MG PO TABS: 0.2500 mg | ORAL_TABLET | Freq: Two times a day (BID) | ORAL | 4 refills | Status: DC | PRN

## 2021-02-13 NOTE — Telephone Encounter (Signed)
   Patient requesting refill for ALPRAZolam Duanne Moron) 0.25 MG tablet   Pharmacy [id number] [place], [place].

## 2021-02-19 ENCOUNTER — Other Ambulatory Visit: Payer: Medicare Other

## 2021-02-21 ENCOUNTER — Ambulatory Visit
Admission: RE | Admit: 2021-02-21 | Discharge: 2021-02-21 | Disposition: A | Discharge status: Home / Self Care | Payer: Medicare Other | Source: Ambulatory Visit | Attending: General Surgery | Admitting: General Surgery

## 2021-02-21 DIAGNOSIS — K432 Incisional hernia without obstruction or gangrene: Secondary | ICD-10-CM | POA: Diagnosis not present

## 2021-02-21 DIAGNOSIS — N281 Cyst of kidney, acquired: Secondary | ICD-10-CM | POA: Diagnosis not present

## 2021-02-21 DIAGNOSIS — I700 Atherosclerosis of aorta: Secondary | ICD-10-CM | POA: Diagnosis not present

## 2021-02-21 DIAGNOSIS — D3501 Benign neoplasm of right adrenal gland: Secondary | ICD-10-CM | POA: Diagnosis not present

## 2021-02-21 MED ORDER — IOPAMIDOL (ISOVUE-370) INJECTION 76%
80.0000 mL | Freq: Once | INTRAVENOUS | Status: AC | PRN
  Administered 2021-02-21: 80 mL via INTRAVENOUS

## 2021-03-08 ENCOUNTER — Ambulatory Visit: Payer: Medicare Other

## 2021-03-12 ENCOUNTER — Other Ambulatory Visit: Payer: Self-pay | Admitting: Family

## 2021-03-12 DIAGNOSIS — I739 Peripheral vascular disease, unspecified: Secondary | ICD-10-CM

## 2021-03-15 ENCOUNTER — Ambulatory Visit
Admission: RE | Admit: 2021-03-15 | Discharge: 2021-03-15 | Disposition: A | Discharge status: Home / Self Care | Payer: Medicare Other | Source: Ambulatory Visit | Attending: Family | Admitting: Family

## 2021-03-15 ENCOUNTER — Other Ambulatory Visit: Payer: Self-pay | Admitting: Family

## 2021-03-15 ENCOUNTER — Other Ambulatory Visit: Payer: Self-pay

## 2021-03-15 DIAGNOSIS — Z1231 Encounter for screening mammogram for malignant neoplasm of breast: Secondary | ICD-10-CM

## 2021-03-18 ENCOUNTER — Other Ambulatory Visit: Payer: Self-pay | Admitting: Family

## 2021-03-18 DIAGNOSIS — E782 Mixed hyperlipidemia: Secondary | ICD-10-CM

## 2021-03-20 ENCOUNTER — Telehealth: Payer: Self-pay | Admitting: Internal Medicine

## 2021-03-20 NOTE — Telephone Encounter (Signed)
Left message for patient to call me back at [phone] to schedule Medicare Annual Wellness Visit   No hx of AWV eligible as of 02/25/13  Please schedule at anytime with [facility] Advisor if patient calls the office back.    40 Minutes appointment   Any questions, please call me at [phone]

## 2021-04-28 ENCOUNTER — Other Ambulatory Visit: Payer: Self-pay | Admitting: Family

## 2021-04-29 NOTE — Telephone Encounter (Signed)
Rx sent to pcp

## 2021-04-30 ENCOUNTER — Other Ambulatory Visit: Payer: Self-pay | Admitting: Family

## 2021-04-30 DIAGNOSIS — E782 Mixed hyperlipidemia: Secondary | ICD-10-CM

## 2021-04-30 DIAGNOSIS — K219 Gastro-esophageal reflux disease without esophagitis: Secondary | ICD-10-CM

## 2021-04-30 DIAGNOSIS — J449 Chronic obstructive pulmonary disease, unspecified: Secondary | ICD-10-CM

## 2021-04-30 DIAGNOSIS — M810 Age-related osteoporosis without current pathological fracture: Secondary | ICD-10-CM

## 2021-04-30 DIAGNOSIS — I10 Essential (primary) hypertension: Secondary | ICD-10-CM

## 2021-05-01 ENCOUNTER — Other Ambulatory Visit: Payer: Self-pay | Admitting: Internal Medicine

## 2021-05-01 DIAGNOSIS — E782 Mixed hyperlipidemia: Secondary | ICD-10-CM

## 2021-05-01 DIAGNOSIS — J449 Chronic obstructive pulmonary disease, unspecified: Secondary | ICD-10-CM

## 2021-05-01 DIAGNOSIS — I10 Essential (primary) hypertension: Secondary | ICD-10-CM

## 2021-05-01 DIAGNOSIS — K219 Gastro-esophageal reflux disease without esophagitis: Secondary | ICD-10-CM

## 2021-05-01 DIAGNOSIS — J301 Allergic rhinitis due to pollen: Secondary | ICD-10-CM

## 2021-05-01 DIAGNOSIS — M810 Age-related osteoporosis without current pathological fracture: Secondary | ICD-10-CM

## 2021-05-01 MED ORDER — METOPROLOL SUCCINATE ER 50 MG PO TB24: 50.0000 mg | ORAL_TABLET | Freq: Two times a day (BID) | ORAL | 1 refills | Status: DC

## 2021-05-01 MED ORDER — RALOXIFENE HCL 60 MG PO TABS: 60.0000 mg | ORAL_TABLET | Freq: Every day | ORAL | 1 refills | Status: DC

## 2021-05-01 MED ORDER — COLESEVELAM HCL 625 MG PO TABS: 625.0000 mg | ORAL_TABLET | Freq: Two times a day (BID) | ORAL | 1 refills | Status: DC

## 2021-05-01 MED ORDER — SYMBICORT 160-4.5 MCG/ACT IN AERO: INHALATION_SPRAY | RESPIRATORY_TRACT | 1 refills | Status: DC

## 2021-05-01 MED ORDER — SPIRIVA RESPIMAT 2.5 MCG/ACT IN AERS: INHALATION_SPRAY | RESPIRATORY_TRACT | 1 refills | Status: DC

## 2021-05-01 MED ORDER — AMLODIPINE BESYLATE 10 MG PO TABS: 10.0000 mg | ORAL_TABLET | Freq: Every day | ORAL | 1 refills | Status: DC

## 2021-05-01 MED ORDER — OMEPRAZOLE 20 MG PO CPDR: 20.0000 mg | DELAYED_RELEASE_CAPSULE | Freq: Every day | ORAL | 1 refills | Status: DC

## 2021-05-01 MED ORDER — LORATADINE 10 MG PO TABS: 10.0000 mg | ORAL_TABLET | Freq: Every day | ORAL | 1 refills | Status: DC

## 2021-05-03 ENCOUNTER — Other Ambulatory Visit: Payer: Self-pay | Admitting: Family

## 2021-05-03 ENCOUNTER — Other Ambulatory Visit: Payer: Self-pay | Admitting: Internal Medicine

## 2021-06-13 ENCOUNTER — Other Ambulatory Visit: Payer: Self-pay | Admitting: Family

## 2021-06-13 ENCOUNTER — Other Ambulatory Visit: Payer: Self-pay | Admitting: Internal Medicine

## 2021-06-13 DIAGNOSIS — J449 Chronic obstructive pulmonary disease, unspecified: Secondary | ICD-10-CM

## 2021-06-13 DIAGNOSIS — F4323 Adjustment disorder with mixed anxiety and depressed mood: Secondary | ICD-10-CM

## 2021-06-13 DIAGNOSIS — E782 Mixed hyperlipidemia: Secondary | ICD-10-CM

## 2021-06-19 DIAGNOSIS — L7634 Postprocedural seroma of skin and subcutaneous tissue following other procedure: Secondary | ICD-10-CM | POA: Diagnosis not present

## 2021-07-15 ENCOUNTER — Telehealth: Payer: Self-pay | Admitting: Internal Medicine

## 2021-07-15 NOTE — Telephone Encounter (Signed)
1.Medication Requested: ALPRAZolam (XANAX) 0.25 MG tablet  2. Pharmacy (Name, Street, [place]): [id number] [place], [address]  3. On Med List: yes  4. Last Visit with PCP: 01-30-2021  5. Next visit date with PCP: 08-05-2021   Agent: Please be advised that RX refills may take up to 3 business days. We ask that you follow-up with your pharmacy.

## 2021-07-16 ENCOUNTER — Other Ambulatory Visit: Payer: Self-pay | Admitting: Internal Medicine

## 2021-07-16 DIAGNOSIS — F411 Generalized anxiety disorder: Secondary | ICD-10-CM

## 2021-07-16 MED ORDER — ALPRAZOLAM 0.25 MG PO TABS: 0.2500 mg | ORAL_TABLET | Freq: Two times a day (BID) | ORAL | 4 refills | Status: DC | PRN

## 2021-08-05 ENCOUNTER — Ambulatory Visit: Payer: Medicare Other | Admitting: Internal Medicine

## 2021-08-13 ENCOUNTER — Other Ambulatory Visit: Payer: Self-pay | Admitting: Internal Medicine

## 2021-08-21 ENCOUNTER — Other Ambulatory Visit: Payer: Self-pay

## 2021-08-21 ENCOUNTER — Ambulatory Visit (INDEPENDENT_AMBULATORY_CARE_PROVIDER_SITE_OTHER): Payer: Medicare Other | Admitting: Internal Medicine

## 2021-08-21 ENCOUNTER — Encounter: Payer: Self-pay | Admitting: Internal Medicine

## 2021-08-21 VITALS — BP 126/56 | HR 67 | Temp 98.2°F | Resp 20 | Ht 60.0 in | Wt 156.0 lb

## 2021-08-21 DIAGNOSIS — N1832 Chronic kidney disease, stage 3b: Secondary | ICD-10-CM

## 2021-08-21 DIAGNOSIS — E785 Hyperlipidemia, unspecified: Secondary | ICD-10-CM

## 2021-08-21 DIAGNOSIS — Z23 Encounter for immunization: Secondary | ICD-10-CM

## 2021-08-21 DIAGNOSIS — J9612 Chronic respiratory failure with hypercapnia: Secondary | ICD-10-CM

## 2021-08-21 DIAGNOSIS — Z0000 Encounter for general adult medical examination without abnormal findings: Secondary | ICD-10-CM

## 2021-08-21 DIAGNOSIS — Z0001 Encounter for general adult medical examination with abnormal findings: Secondary | ICD-10-CM

## 2021-08-21 DIAGNOSIS — R7303 Prediabetes: Secondary | ICD-10-CM

## 2021-08-21 LAB — CBC WITH DIFFERENTIAL/PLATELET
Basophils Absolute: 0 10*3/uL (ref 0.0–0.1)
Basophils Relative: 0.6 % (ref 0.0–3.0)
Eosinophils Absolute: 0.3 10*3/uL (ref 0.0–0.7)
Eosinophils Relative: 4.2 % (ref 0.0–5.0)
HCT: 40.9 % (ref 36.0–46.0)
Hemoglobin: 12.9 g/dL (ref 12.0–15.0)
Lymphocytes Relative: 21.2 % (ref 12.0–46.0)
Lymphs Abs: 1.6 10*3/uL (ref 0.7–4.0)
MCHC: 31.7 g/dL (ref 30.0–36.0)
MCV: 84.4 fl (ref 78.0–100.0)
Monocytes Absolute: 1 10*3/uL (ref 0.1–1.0)
Monocytes Relative: 12.8 % — ABNORMAL HIGH (ref 3.0–12.0)
Neutro Abs: 4.6 10*3/uL (ref 1.4–7.7)
Neutrophils Relative %: 61.2 % (ref 43.0–77.0)
Platelets: 367 10*3/uL (ref 150.0–400.0)
RBC: 4.85 Mil/uL (ref 3.87–5.11)
RDW: 16.9 % — ABNORMAL HIGH (ref 11.5–15.5)
WBC: 7.5 10*3/uL (ref 4.0–10.5)

## 2021-08-21 LAB — BASIC METABOLIC PANEL
BUN: 19 mg/dL (ref 6–23)
CO2: 33 mEq/L — ABNORMAL HIGH (ref 19–32)
Calcium: 8.7 mg/dL (ref 8.4–10.5)
Chloride: 100 mEq/L (ref 96–112)
Creatinine, Ser: 0.98 mg/dL (ref 0.40–1.20)
GFR: 54.7 mL/min — ABNORMAL LOW (ref >60.00)
Glucose, Bld: 90 mg/dL (ref 70–99)
Potassium: 4.1 mEq/L (ref 3.5–5.1)
Sodium: 137 mEq/L (ref 135–145)

## 2021-08-21 LAB — LIPID PANEL
Cholesterol: 145 mg/dL (ref 0–200)
HDL: 41.7 mg/dL (ref >39.00)
NonHDL: 103.42
Total CHOL/HDL Ratio: 3
Triglycerides: 207 mg/dL — ABNORMAL HIGH (ref 0.0–149.0)
VLDL: 41.4 mg/dL — ABNORMAL HIGH (ref 0.0–40.0)

## 2021-08-21 LAB — HEPATIC FUNCTION PANEL
ALT: 10 U/L (ref 0–35)
AST: 18 U/L (ref 0–37)
Albumin: 3.2 g/dL — ABNORMAL LOW (ref 3.5–5.2)
Alkaline Phosphatase: 47 U/L (ref 39–117)
Bilirubin, Direct: 0.1 mg/dL (ref 0.0–0.3)
Total Bilirubin: 0.4 mg/dL (ref 0.2–1.2)
Total Protein: 6.6 g/dL (ref 6.0–8.3)

## 2021-08-21 LAB — LDL CHOLESTEROL, DIRECT: Direct LDL: 81 mg/dL

## 2021-08-21 MED ORDER — SHINGRIX 50 MCG/0.5ML IM SUSR: 0.5000 mL | Freq: Once | INTRAMUSCULAR | 1 refills | Status: AC

## 2021-08-21 NOTE — Progress Notes (Signed)
Subjective:  Patient ID: Courtney Brown, female    DOB: 08-29-1940  Age: 81 y.o. MRN: [id number]  CC: Annual Exam, Hypertension, and Hyperlipidemia  This visit occurred during the SARS-CoV-2 public health emergency.  Safety protocols were in place, including screening questions prior to the visit, additional usage of staff PPE, and extensive cleaning of exam room while observing appropriate contact time as indicated for disinfecting solutions.    HPI Courtney Brown presents for a CPX and f/up -  She complains of a baseline level of shortness of breath.  She denies cough, wheezing, chest pain, or hemoptysis.  Outpatient Medications Prior to Visit  Medication Sig Dispense Refill   acetaminophen (TYLENOL) 500 MG tablet Take 500 mg by mouth every 6 (six) hours as needed for mild pain or moderate pain.     albuterol (VENTOLIN HFA) 108 (90 Base) MCG/ACT inhaler INHALE 1-2 PUFFS BY MOUTH EVERY 6 HOURS AS NEEDED FOR WHEEZE OR SHORTNESS OF BREATH 6.7 each 1   ALPRAZolam (XANAX) 0.25 MG tablet Take 1 tablet (0.25 mg total) by mouth 2 (two) times daily as needed for anxiety. 30 tablet 4   aspirin 81 MG tablet Take 81 mg by mouth at bedtime.     budesonide-formoterol (SYMBICORT) 160-4.5 MCG/ACT inhaler TAKE 2 PUFFS BY MOUTH TWICE A DAY 30.6 each 1   Calcium Citrate-Vitamin D (CITRACAL + D PO) Take 1 tablet by mouth 2 (two) times daily. 600 mg calcium     citalopram (CELEXA) 20 MG tablet TAKE 1 TABLET BY MOUTH EVERY DAY 90 tablet 1   colesevelam (WELCHOL) 625 MG tablet Take 1 tablet (625 mg total) by mouth 2 (two) times daily with a meal. 180 tablet 1   fenofibrate (TRICOR) 145 MG tablet TAKE 1 TABLET BY MOUTH EVERY DAY 90 tablet 1   furosemide (LASIX) 20 MG tablet TAKE 1 TABLET BY MOUTH DAILY FOR LEG EDEMA 90 tablet 1   gabapentin (NEURONTIN) 100 MG capsule Take 1 capsule (100 mg total) by mouth 2 (two) times daily. 563 capsule 1   Garlic Oil 8937 MG CAPS Take 1,000 mg by mouth daily.     Lactobacillus  (PROBIOTIC ACIDOPHILUS) CAPS Take 1 tablet by mouth daily.     loratadine (CLARITIN) 10 MG tablet Take 1 tablet (10 mg total) by mouth daily. 90 tablet 1   magnesium oxide (MAG-OX) 400 (240 Mg) MG tablet TAKE 1 TABLET BY MOUTH TWICE A DAY 165 tablet 0   metoprolol succinate (TOPROL-XL) 50 MG 24 hr tablet Take 1 tablet (50 mg total) by mouth 2 (two) times daily. 180 tablet 1   mometasone (NASONEX) 50 MCG/ACT nasal spray PLACE 2 SPRAYS INTO THE NOSE DAILY. 51 each 3   omeprazole (PRILOSEC) 20 MG capsule Take 1 capsule (20 mg total) by mouth daily. 90 capsule 1   raloxifene (EVISTA) 60 MG tablet Take 1 tablet (60 mg total) by mouth daily. 90 tablet 1   SPIRIVA RESPIMAT 2.5 MCG/ACT AERS 2 puff per day 12 g 1   triamcinolone cream (KENALOG) 0.1 % Apply 1 application topically 2 (two) times daily. (Patient taking differently: Apply 1 application topically daily as needed (yeast infection under breast).) 30 g 0   amLODipine (NORVASC) 10 MG tablet Take 1 tablet (10 mg total) by mouth daily. 90 tablet 1   magnesium oxide (MAG-OX) 400 MG tablet TAKE 1 TABLET BY MOUTH TWICE A DAY (Patient taking differently: Take 400 mg by mouth 2 (two) times daily.) 180 tablet 2  No facility-administered medications prior to visit.    ROS Review of Systems  Constitutional:  Negative for chills, diaphoresis, fatigue, fever and unexpected weight change.  HENT: Negative.    Eyes: Negative.   Respiratory:  Positive for shortness of breath. Negative for cough, chest tightness and wheezing.   Cardiovascular:  Negative for chest pain, palpitations and leg swelling.  Gastrointestinal:  Negative for abdominal pain, constipation, diarrhea, nausea and vomiting.  Endocrine: Negative.   Genitourinary: Negative.  Negative for difficulty urinating, dysuria and hematuria.  Musculoskeletal: Negative.  Negative for arthralgias and myalgias.  Skin: Negative.  Negative for color change and pallor.  Neurological: Negative.  Negative  for dizziness, weakness and light-headedness.  Hematological:  Negative for adenopathy. Does not bruise/bleed easily.  Psychiatric/Behavioral: Negative.  Negative for agitation.    Objective:  BP (!) 126/56 (BP Location: Left Arm, Patient Position: Sitting, Cuff Size: Large)    Pulse 67    Temp 98.2 F (36.8 C) (Oral)    Resp 20    Ht 5' (1.524 m)    Wt 156 lb (70.8 kg)    SpO2 93%    BMI 30.47 kg/m   BP Readings from Last 3 Encounters:  08/21/21 (!) 126/56  01/30/21 128/76  10/30/20 (!) 137/55    Wt Readings from Last 3 Encounters:  08/21/21 156 lb (70.8 kg)  01/30/21 161 lb (73 kg)  10/26/20 160 lb (72.6 kg)    Physical Exam Vitals reviewed.  Constitutional:      Appearance: She is not ill-appearing.  HENT:     Nose: Nose normal.     Mouth/Throat:     Mouth: Mucous membranes are moist.  Eyes:     General: No scleral icterus.    Conjunctiva/sclera: Conjunctivae normal.  Cardiovascular:     Rate and Rhythm: Normal rate and regular rhythm.     Heart sounds: No murmur heard. Pulmonary:     Effort: Pulmonary effort is normal.     Breath sounds: No stridor. No wheezing, rhonchi or rales.  Abdominal:     General: Abdomen is flat.     Palpations: There is no mass.     Tenderness: There is no abdominal tenderness. There is no guarding.     Hernia: No hernia is present.  Musculoskeletal:        General: Normal range of motion.     Cervical back: Neck supple.     Right lower leg: No edema.     Left lower leg: No edema.  Lymphadenopathy:     Cervical: No cervical adenopathy.  Skin:    General: Skin is warm and dry.     Coloration: Skin is not pale.  Neurological:     General: No focal deficit present.     Mental Status: She is alert.  Psychiatric:        Mood and Affect: Mood normal.        Behavior: Behavior normal.    Lab Results  Component Value Date   WBC 7.5 08/21/2021   HGB 12.9 08/21/2021   HCT 40.9 08/21/2021   PLT 367.0 08/21/2021   GLUCOSE 90  08/21/2021   CHOL 145 08/21/2021   TRIG 207.0 (H) 08/21/2021   HDL 41.70 08/21/2021   LDLDIRECT 81.0 08/21/2021   LDLCALC 74 07/10/2020   ALT 10 08/21/2021   AST 18 08/21/2021   NA 137 08/21/2021   K 4.1 08/21/2021   CL 100 08/21/2021   CREATININE 0.98 08/21/2021   BUN 19  08/21/2021   CO2 33 (H) 08/21/2021   TSH 3.42 01/30/2021   HGBA1C 5.7 08/21/2021    MM 3D SCREEN BREAST BILATERAL  Result Date: 03/19/2021 CLINICAL DATA:  Screening. EXAM: DIGITAL SCREENING BILATERAL MAMMOGRAM WITH TOMOSYNTHESIS AND CAD TECHNIQUE: Bilateral screening digital craniocaudal and mediolateral oblique mammograms were obtained. Bilateral screening digital breast tomosynthesis was performed. The images were evaluated with computer-aided detection. COMPARISON:  Previous exam(s). ACR Breast Density Category b: There are scattered areas of fibroglandular density. FINDINGS: There are no findings suspicious for malignancy. IMPRESSION: No mammographic evidence of malignancy. A result letter of this screening mammogram will be mailed directly to the patient. RECOMMENDATION: Screening mammogram in one year. (Code:[id number]-B-01Y) BI-RADS CATEGORY  1: Negative. Electronically Signed   By: Margarette Canada M.D.   On: 03/19/2021 09:17    Assessment & Plan:   Darlina was seen today for annual exam, hypertension and hyperlipidemia.  Diagnoses and all orders for this visit:  Chronic respiratory failure with hypercapnia ([place])- She is well compensated. -     CBC with Differential/Platelet; Future -     CBC with Differential/Platelet  Stage 3b chronic kidney disease ([place])- Her renal function is stable.  Her blood pressure is somewhat overcontrolled.  Will discontinue the CCB. -     Basic metabolic panel; Future -     Urinalysis, Routine w reflex microscopic; Future -     CBC with Differential/Platelet; Future -     CBC with Differential/Platelet -     Urinalysis, Routine w reflex microscopic -     Basic metabolic  panel  Encounter for general adult medical examination with abnormal findings- Exam completed, labs reviewed, vaccines reviewed and updated, no cancer screenings indicated, patient education was given.  Prediabetes- Her A1c is normal. -     Basic metabolic panel; Future -     Hemoglobin A1c; Future -     Hemoglobin A1c -     Basic metabolic panel  Hyperlipidemia LDL goal <70- She does not tolerate statins. -     Lipid panel; Future -     Hepatic function panel; Future -     Hepatic function panel -     Lipid panel  Need for shingles vaccine -     Zoster Vaccine Adjuvanted [facility]) injection; Inject 0.5 mLs into the muscle once for 1 dose.  Other orders -     LDL cholesterol, direct   I have discontinued [facility]'s amLODipine. I am also having her start on Shingrix. Additionally, I am having her maintain her aspirin, Probiotic Acidophilus, acetaminophen, Garlic Oil, triamcinolone cream, Calcium Citrate-Vitamin D (CITRACAL + D PO), mometasone, albuterol, gabapentin, furosemide, colesevelam, loratadine, metoprolol succinate, omeprazole, raloxifene, Spiriva Respimat, budesonide-formoterol, citalopram, fenofibrate, ALPRAZolam, and magnesium oxide.  Meds ordered this encounter  Medications   Zoster Vaccine Adjuvanted [facility]) injection    Sig: Inject 0.5 mLs into the muscle once for 1 dose.    Dispense:  0.5 mL    Refill:  1     Follow-up: Return in about 6 months (around 02/18/2022).  Scarlette Calico, MD

## 2021-08-21 NOTE — Patient Instructions (Signed)
Chronic Obstructive Pulmonary Disease °Chronic obstructive pulmonary disease (COPD) is a long-term (chronic) condition that affects the lungs. COPD is a general term that can be used to describe many different lung problems that cause lung inflammation and limit airflow, including chronic bronchitis and emphysema. °If you have COPD, your lung function will probably never return to normal. In most cases, it gets worse over time. However, there are steps you can take to slow the progression of the disease and improve your quality of life. °What are the causes? °This condition may be caused by: °Smoking. This is the most common cause. °Certain genes passed down through families. °What increases the risk? °The following factors may make you more likely to develop this condition: °Being exposed to secondhand smoke from cigarettes, pipes, or cigars. °Being exposed to chemicals and other irritants, such as fumes and dust in the work environment. °Having chronic lung conditions or infections. °What are the signs or symptoms? °Symptoms of this condition include: °Shortness of breath, especially during physical activity. °Chronic cough with a large amount of thick mucus. Sometimes, the cough may not have any mucus (dry cough). °Wheezing and rapid breathing. °Gray or bluish discoloration (cyanosis) of the skin, especially in the fingers, toes, or lips. °Feeling tired (fatigue). °Weight loss. °Chest tightness. °Frequent infections. °Episodes when breathing symptoms become much worse (exacerbations). °At the later stages of this disease, you may have swelling in the ankles, feet, or legs. °How is this diagnosed? °This condition is diagnosed based on: °Your medical history. °A physical exam. °You may also have tests, including: °Lung (pulmonary) function tests. This may include a spirometry test, which measures your ability to exhale properly. °Chest X-ray. °CT scan. °Blood tests. °How is this treated? °This condition may be  treated with: °Medicines. These may include inhaled rescue medicines to treat acute exacerbations as well as medicines that you take long-term (maintenance medicines) to prevent flare-ups of COPD. °Bronchodilators help treat COPD by dilating the airways to allow increased airflow and make your breathing more comfortable. °Steroids can reduce airway inflammation and help prevent exacerbations. °Smoking cessation. If you smoke, your health care provider may ask you to quit, and may also recommend therapy or replacement products to help you quit. °Pulmonary rehabilitation. This may involve working with a team of health care providers and specialists, such as respiratory, occupational, and physical therapists. °Exercise and physical activity. These are beneficial for nearly all people with COPD. °Nutrition therapy to gain weight, if you are underweight. °Oxygen. Supplemental oxygen therapy is only helpful if you have a low oxygen level in your blood (hypoxemia). °Lung surgery or transplant. °Palliative care. This is to help people with COPD feel comfortable when treatment is no longer working. °Follow these instructions at home: °Medicines °Take over-the-counter and prescription medicines only as told by your health care provider. This includes inhaled medicines and pills. °Talk to your health care provider before taking any cough or allergy medicines. You may need to avoid certain medicines that dry out your airways. °Lifestyle °If you smoke, the most important thing that you can do is to stop smoking. Continuing to smoke will cause the disease to progress faster. °Do not use any products that contain nicotine or tobacco. These products include cigarettes, chewing tobacco, and vaping devices, such as e-cigarettes. If you need help quitting, ask your health care provider. °Avoid exposure to things that irritate your lungs, such as smoke, chemicals, and fumes. °Stay active, but balance activity with periods of rest.  Exercise and physical   activity will help you maintain your ability to do things you want to do. °Learn and use relaxation techniques to manage stress and to control your breathing. °Get the right amount of sleep and get quality sleep. Most adults need 7 or more hours per night. °Eat healthy foods. Eating smaller, more frequent meals and resting before meals may help you maintain your strength. °Controlled breathing °Learn and use controlled breathing techniques as directed by your health care provider. Controlled breathing techniques include: °Pursed lip breathing. Start by breathing in (inhaling) through your nose for 1 second. Then, purse your lips as if you were going to whistle and breathe out (exhale) through the pursed lips for 2 seconds. °Diaphragmatic breathing. Start by putting one hand on your abdomen just above your waist. Inhale slowly through your nose. The hand on your abdomen should move out. Then purse your lips and exhale slowly. You should be able to feel the hand on your abdomen moving in as you exhale. ° °Controlled coughing °Learn and use controlled coughing to clear mucus from your lungs. Controlled coughing is a series of short, progressive coughs. The steps of controlled coughing are: °Lean your head slightly forward. °Breathe in deeply using diaphragmatic breathing. °Try to hold your breath for 3 seconds. °Keep your mouth slightly open while coughing twice. °Spit any mucus out into a tissue. °Rest and repeat the steps once or twice as needed. °General instructions °Make sure you receive all the vaccines that your health care provider recommends, especially the pneumococcal and influenza vaccines. Preventing infection and hospitalization is very important when you have COPD. °Drink enough fluid to keep your urine pale yellow, unless you have a medical condition that requires fluid restriction. °Use oxygen therapy and pulmonary rehabilitation if told by your health care provider. If you  require home oxygen therapy, ask your health care provider whether you should purchase a pulse oximeter to measure your oxygen level at home. °Work with your health care provider to develop a COPD action plan. This will help you know what steps to take if your condition gets worse. °Keep other chronic health conditions under control as told by your health care provider. °Avoid extreme temperature and humidity changes. °Avoid contact with people who have an illness that spreads from person to person (is contagious), such as viral infections or pneumonia. °Keep all follow-up visits. This is important. °Contact a health care provider if: °You are coughing up more mucus than usual. °There is a change in the color or thickness of your mucus. °Your breathing is more labored than usual. °Your breathing is faster than usual. °You have difficulty sleeping. °You need to use your rescue medicines or inhalers more often than expected. °You have trouble doing routine activities such as getting dressed or walking around the house. °Get help right away if: °You have shortness of breath while you are resting. °You have shortness of breath that prevents you from: °Being able to talk. °Performing your usual physical activities. °You have chest pain lasting longer than 5 minutes. °Your skin color is more blue (cyanotic) than usual. °You measure low oxygen saturations for longer than 5 minutes with a pulse oximeter. °You have a fever. °You feel too tired to breathe normally. °These symptoms may represent a serious problem that is an emergency. Do not wait to see if the symptoms will go away. Get medical help right away. Call your local emergency services (911 in the [place] Do not drive yourself to the hospital. °Summary °Chronic obstructive pulmonary   disease (COPD) is a long-term (chronic) condition that affects the lungs. °Your lung function will probably never return to normal. In most cases, it gets worse over time. However, there  are steps you can take to slow the progression of the disease and improve your quality of life. °Treatment for COPD may include taking medicines, quitting smoking, pulmonary rehabilitation, and changes to diet and exercise. As the disease progresses, you may need oxygen therapy, a lung transplant, or palliative care. °To help manage your condition, do not smoke, avoid exposure to things that irritate your lungs, stay up to date on all vaccines, and follow your health care provider's instructions for taking medicines. °This information is not intended to replace advice given to you by your health care provider. Make sure you discuss any questions you have with your health care provider. °Document Revised: 05/22/2020 Document Reviewed: 05/22/2020 °Elsevier Patient Education © 2022 [employer] ° °

## 2021-08-22 LAB — URINALYSIS, ROUTINE W REFLEX MICROSCOPIC
Bilirubin Urine: NEGATIVE
Hgb urine dipstick: NEGATIVE
Ketones, ur: NEGATIVE
Nitrite: NEGATIVE
Specific Gravity, Urine: 1.015 (ref 1.000–1.030)
Total Protein, Urine: NEGATIVE
Urine Glucose: NEGATIVE
Urobilinogen, UA: 0.2 (ref 0.0–1.0)
pH: 5.5 (ref 5.0–8.0)

## 2021-08-22 LAB — HEMOGLOBIN A1C: Hgb A1c MFr Bld: 5.7 % (ref 4.6–6.5)

## 2021-09-03 ENCOUNTER — Other Ambulatory Visit: Payer: Self-pay | Admitting: Internal Medicine

## 2021-09-03 DIAGNOSIS — J449 Chronic obstructive pulmonary disease, unspecified: Secondary | ICD-10-CM

## 2021-09-05 ENCOUNTER — Other Ambulatory Visit: Payer: Self-pay | Admitting: Internal Medicine

## 2021-10-03 DIAGNOSIS — S301XXA Contusion of abdominal wall, initial encounter: Secondary | ICD-10-CM | POA: Diagnosis not present

## 2021-10-31 ENCOUNTER — Other Ambulatory Visit: Payer: Self-pay | Admitting: Internal Medicine

## 2021-10-31 DIAGNOSIS — J449 Chronic obstructive pulmonary disease, unspecified: Secondary | ICD-10-CM

## 2021-11-21 ENCOUNTER — Other Ambulatory Visit: Payer: Self-pay | Admitting: Internal Medicine

## 2021-11-21 DIAGNOSIS — I10 Essential (primary) hypertension: Secondary | ICD-10-CM

## 2021-11-21 DIAGNOSIS — M810 Age-related osteoporosis without current pathological fracture: Secondary | ICD-10-CM

## 2021-11-21 MED ORDER — RALOXIFENE HCL 60 MG PO TABS: 60.0000 mg | ORAL_TABLET | Freq: Every day | ORAL | 1 refills | Status: DC

## 2021-12-09 ENCOUNTER — Other Ambulatory Visit: Payer: Self-pay | Admitting: Internal Medicine

## 2021-12-09 DIAGNOSIS — I739 Peripheral vascular disease, unspecified: Secondary | ICD-10-CM

## 2021-12-12 ENCOUNTER — Other Ambulatory Visit: Payer: Self-pay | Admitting: Internal Medicine

## 2021-12-12 ENCOUNTER — Telehealth: Payer: Self-pay | Admitting: Internal Medicine

## 2021-12-12 DIAGNOSIS — F411 Generalized anxiety disorder: Secondary | ICD-10-CM

## 2021-12-12 MED ORDER — ALPRAZOLAM 0.25 MG PO TABS: 0.2500 mg | ORAL_TABLET | Freq: Two times a day (BID) | ORAL | 4 refills | Status: DC | PRN

## 2021-12-12 NOTE — Telephone Encounter (Signed)
1.Medication Requested: ALPRAZolam (XANAX) 0.25 MG tablet 2. Pharmacy (Name, Street, [place]): [id number] [place] [address] [place] Phone:  [phone] Fax:  [phone]    3. On Med List: yes  4. Last Visit with PCP:  5. Next visit date with PCP:   Agent: Please be advised that RX refills may take up to 3 business days. We ask that you follow-up with your pharmacy.

## 2022-01-01 IMAGING — CT CT ABD-PELV W/ CM
2 of 5 series · 16 of 46 positions shown, 18 images · IV contrast (iopamidol)
Comparison: January 25, 2019.

CLINICAL DATA: Recurrent incisional hernia.

EXAM:
CT ABDOMEN AND PELVIS WITH CONTRAST
TECHNIQUE: Multidetector CT imaging of the abdomen and pelvis was performed
using the standard protocol following bolus administration of
intravenous contrast.
CONTRAST:  80mL UQOXCO-0DW IOPAMIDOL (UQOXCO-0DW) INJECTION 76%

[Series 2: abd pelvis 5.00 br40 s3 axial · axial · 0.78mm/px · z∈[+1223,+1613]mm · 13 of 88 slices shown, 15 images]
[im 5/88  soft-tissue]
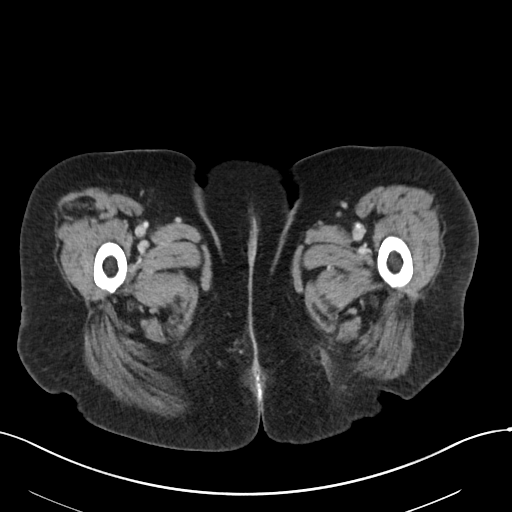
[im 5/88  bone]
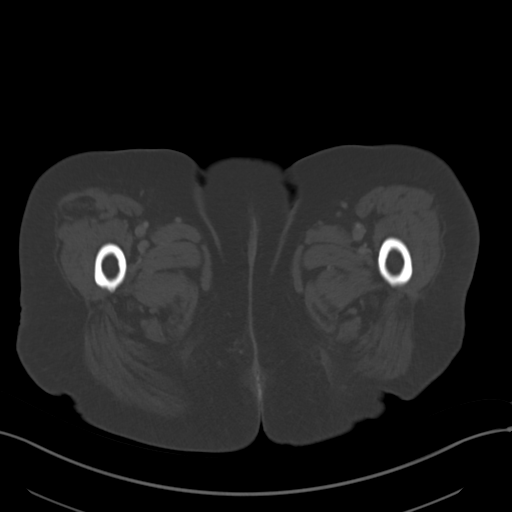
[im 14/88  soft-tissue]
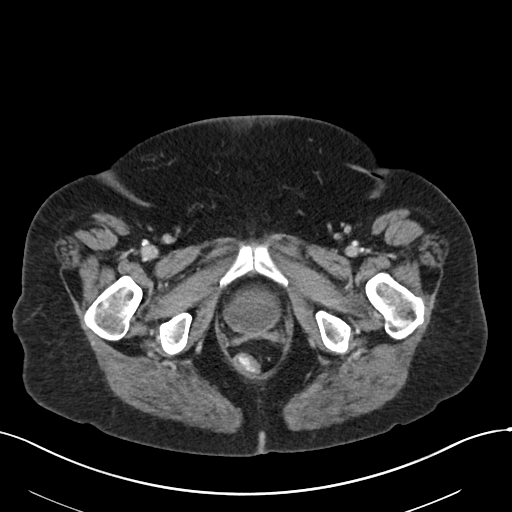
[im 19/88  soft-tissue]
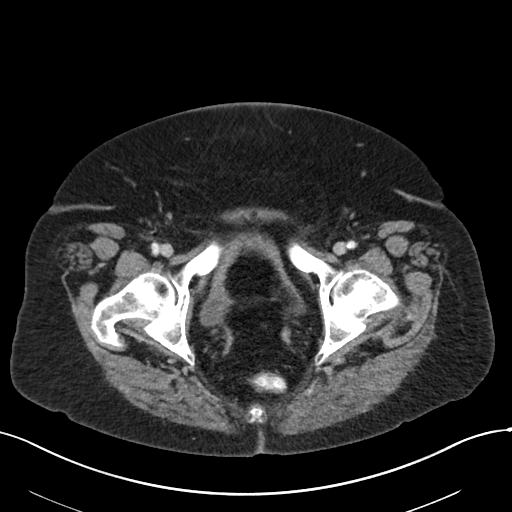
[im 23/88  soft-tissue]
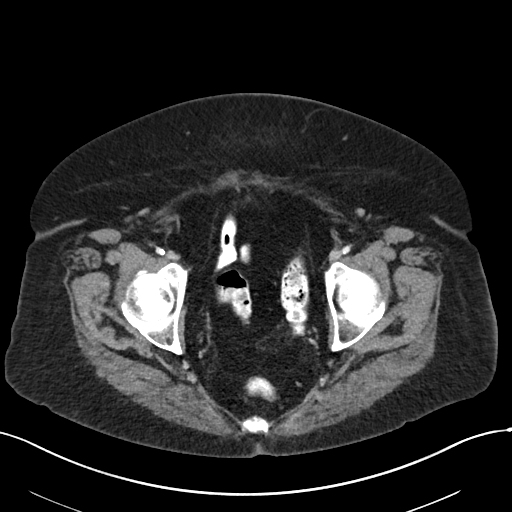
[im 33/88  soft-tissue]
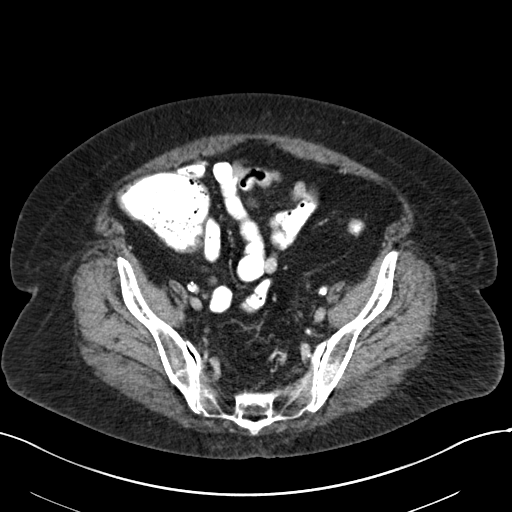
[im 37/88  soft-tissue]
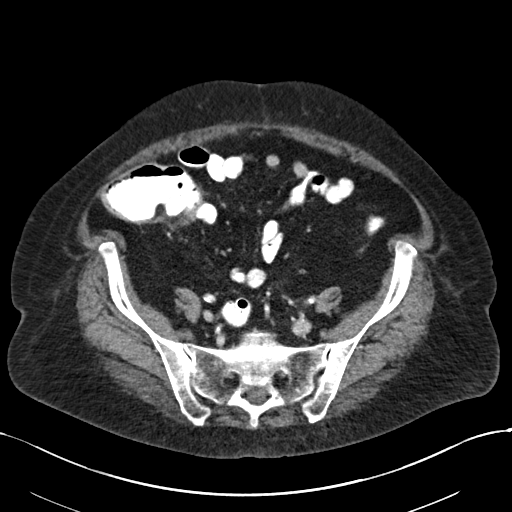
[im 46/88  soft-tissue]
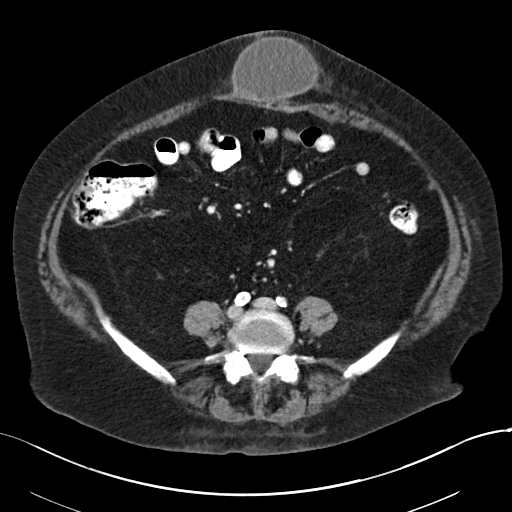
[im 51/88  soft-tissue]
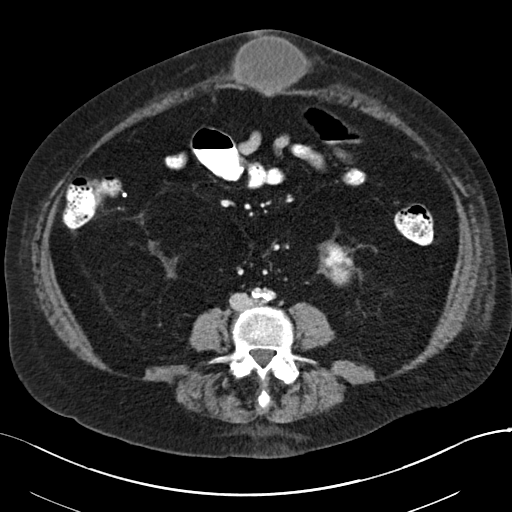
[im 55/88  soft-tissue]
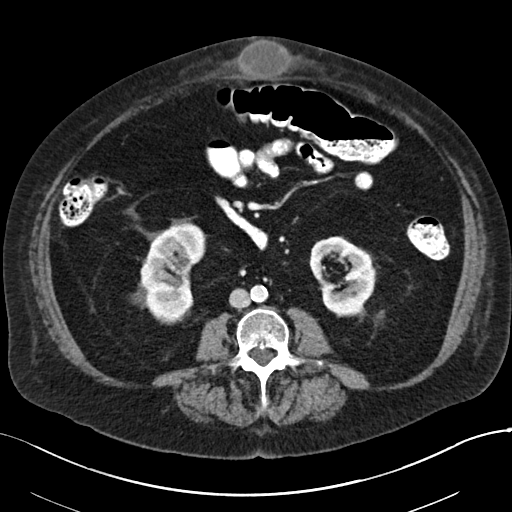
[im 55/88  bone]
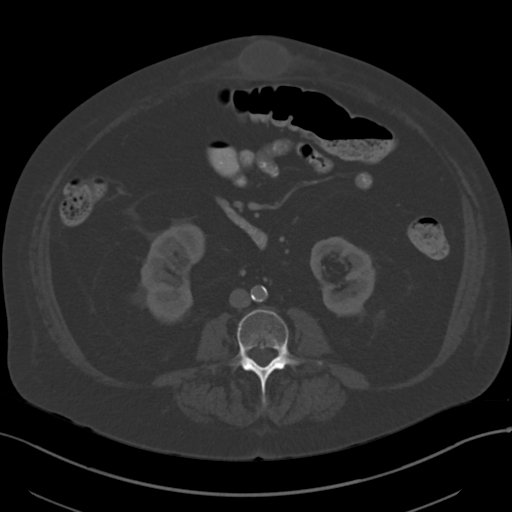
[im 65/88  soft-tissue]
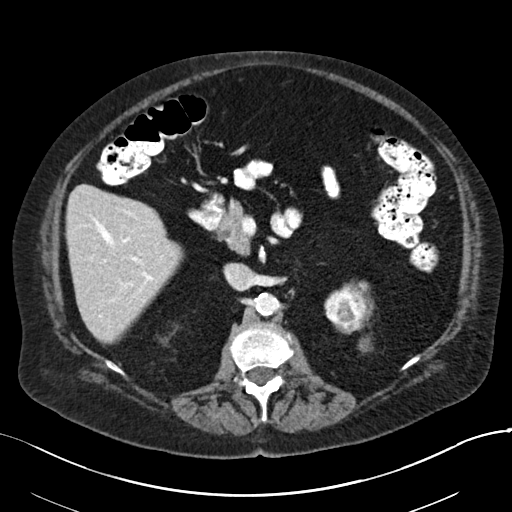
[im 69/88  soft-tissue]
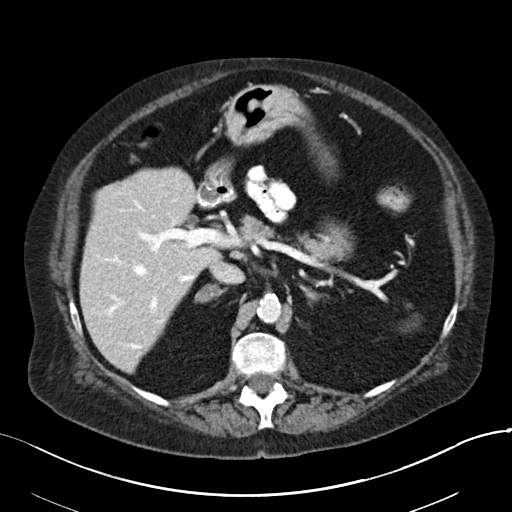
[im 74/88  soft-tissue]
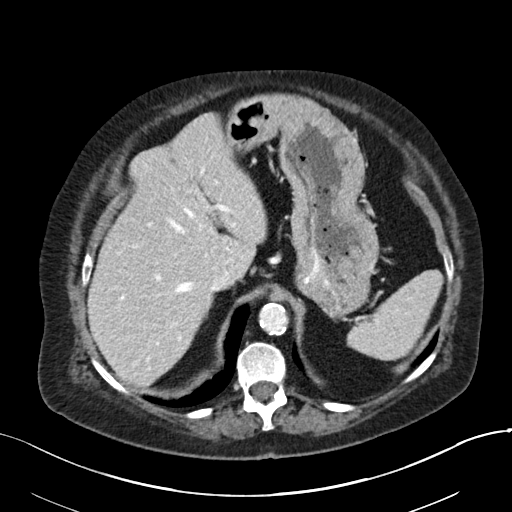
[im 83/88  soft-tissue]
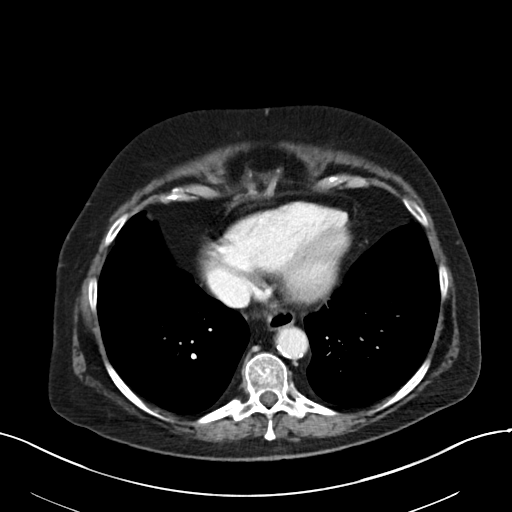

[Series 6: abd pelvis 2.00 br40 s3 cor · coronal · 0.78mm/px · 3 of 200 slices shown]
[im 67/200  soft-tissue]
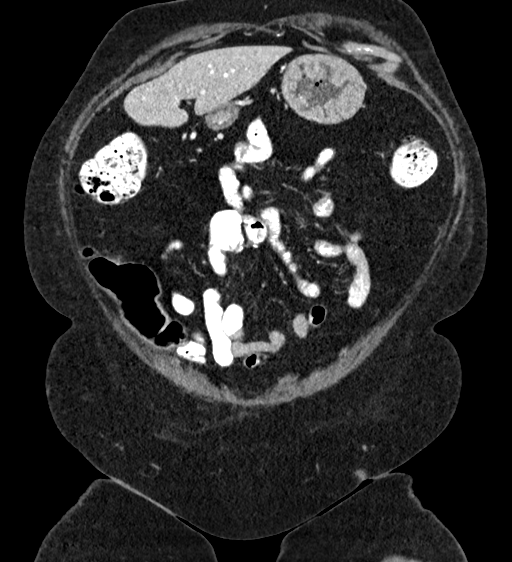
[im 89/200  soft-tissue]
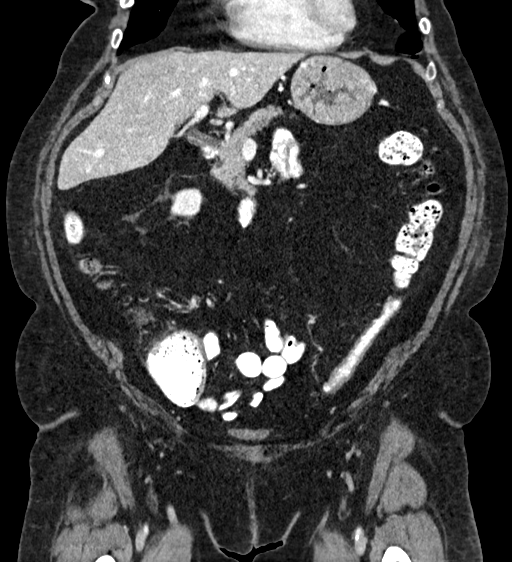
[im 111/200  soft-tissue]
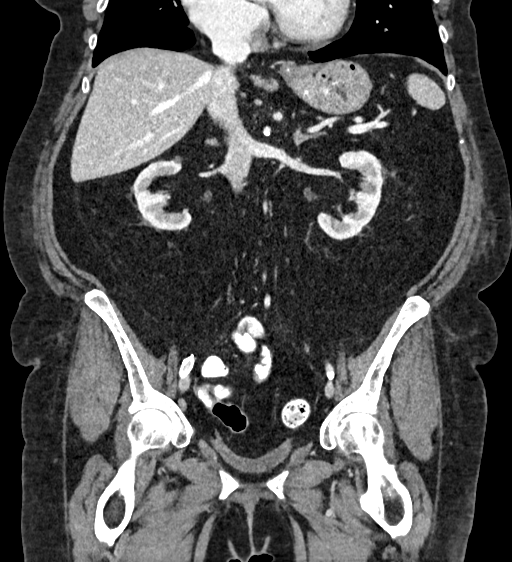

[16 of 46 positions shown; findings below may reference images not displayed]

FINDINGS: Lower chest: No acute abnormality.

Hepatobiliary: No focal liver abnormality is seen. Status post
cholecystectomy. No biliary dilatation.

Pancreas: Unremarkable. No pancreatic ductal dilatation or
surrounding inflammatory changes.

Spleen: Normal in size without focal abnormality.

Adrenals/Urinary Tract: Stable right adrenal adenoma. Left adrenal
gland appears normal. Stable bilateral renal cysts are noted. No
hydronephrosis or renal obstruction is noted. Urinary bladder is
unremarkable.

Stomach/Bowel: Stomach appears normal. There is no evidence of bowel
obstruction or inflammation.

Vascular/Lymphatic: Aortic atherosclerosis. No enlarged abdominal or
pelvic lymph nodes.

Reproductive: Status post hysterectomy. No adnexal masses.

Other: 9.2 x 4.4 cm fluid collection is seen in the midline incision
of the anterior abdominal wall most consistent with seroma. No
definite recurrent hernia is noted. No ascites is noted.

Musculoskeletal: No acute or significant osseous findings.
IMPRESSION: 9.2 x 4.4 cm fluid collection is seen in the midline incision of the
anterior abdominal wall most consistent with seroma. No definite
evidence of recurrent hernia.

Stable right adrenal adenoma.

Aortic Atherosclerosis ([id number]-97Z.[id number]).

## 2022-01-09 ENCOUNTER — Other Ambulatory Visit: Payer: Self-pay | Admitting: Internal Medicine

## 2022-01-09 DIAGNOSIS — E782 Mixed hyperlipidemia: Secondary | ICD-10-CM

## 2022-01-09 DIAGNOSIS — I10 Essential (primary) hypertension: Secondary | ICD-10-CM

## 2022-01-09 DIAGNOSIS — F4323 Adjustment disorder with mixed anxiety and depressed mood: Secondary | ICD-10-CM

## 2022-01-09 DIAGNOSIS — K219 Gastro-esophageal reflux disease without esophagitis: Secondary | ICD-10-CM

## 2022-01-17 ENCOUNTER — Other Ambulatory Visit: Payer: Self-pay | Admitting: Internal Medicine

## 2022-01-17 DIAGNOSIS — E782 Mixed hyperlipidemia: Secondary | ICD-10-CM

## 2022-02-18 ENCOUNTER — Ambulatory Visit (INDEPENDENT_AMBULATORY_CARE_PROVIDER_SITE_OTHER): Payer: Medicare Other | Admitting: Internal Medicine

## 2022-02-18 VITALS — BP 122/58 | HR 70 | Temp 98.1°F | Ht 60.0 in | Wt 152.0 lb

## 2022-02-18 DIAGNOSIS — K219 Gastro-esophageal reflux disease without esophagitis: Secondary | ICD-10-CM

## 2022-02-18 DIAGNOSIS — N1832 Chronic kidney disease, stage 3b: Secondary | ICD-10-CM

## 2022-02-18 DIAGNOSIS — G252 Other specified forms of tremor: Secondary | ICD-10-CM

## 2022-02-18 DIAGNOSIS — I10 Essential (primary) hypertension: Secondary | ICD-10-CM

## 2022-02-18 MED ORDER — METOPROLOL SUCCINATE ER 50 MG PO TB24: 50.0000 mg | ORAL_TABLET | Freq: Every day | ORAL | 1 refills | Status: DC

## 2022-02-18 NOTE — Progress Notes (Signed)
Subjective:  Patient ID: Courtney Brown, female    DOB: March 14, 1941  Age: 81 y.o. MRN: [id number]  CC: No chief complaint on file.   HPI Courtney Brown presents for f/up -  Patient reports BLE weakness with shaking and burning for the last 6 months which has progressively gotten worse. Patient reports falling 2 months ago (tripped over her cat). She has also experienced some lightheadedness and has to hold on to something to catch herself. She stated that she has also noticed tremors in her hands. Patient denies CP or SOB.    Outpatient Medications Prior to Visit  Medication Sig Dispense Refill   acetaminophen (TYLENOL) 500 MG tablet Take 500 mg by mouth every 6 (six) hours as needed for mild pain or moderate pain.     albuterol (VENTOLIN HFA) 108 (90 Base) MCG/ACT inhaler INHALE 1-2 PUFFS BY MOUTH EVERY 6 HOURS AS NEEDED FOR WHEEZE OR SHORTNESS OF BREATH 6.7 each 1   ALPRAZolam (XANAX) 0.25 MG tablet Take 1 tablet (0.25 mg total) by mouth 2 (two) times daily as needed for anxiety. 30 tablet 4   aspirin 81 MG tablet Take 81 mg by mouth at bedtime.     budesonide-formoterol (SYMBICORT) 160-4.5 MCG/ACT inhaler TAKE 2 PUFFS BY MOUTH TWICE A DAY 30.6 each 1   Calcium Citrate-Vitamin D (CITRACAL + D PO) Take 1 tablet by mouth 2 (two) times daily. 600 mg calcium     citalopram (CELEXA) 20 MG tablet TAKE 1 TABLET BY MOUTH EVERY DAY 90 tablet 1   colesevelam (WELCHOL) 625 MG tablet TAKE 1 TABLET (625 MG TOTAL) BY MOUTH 2 (TWO) TIMES DAILY WITH A MEAL. 180 tablet 1   fenofibrate (TRICOR) 145 MG tablet TAKE 1 TABLET BY MOUTH EVERY DAY 90 tablet 1   furosemide (LASIX) 20 MG tablet TAKE 1 TABLET BY MOUTH DAILY FOR LEG EDEMA 90 tablet 1   gabapentin (NEURONTIN) 100 MG capsule Take 1 capsule (100 mg total) by mouth 2 (two) times daily. 180 capsule 1   Garlic Oil 1000 MG CAPS Take 1,000 mg by mouth daily.     Lactobacillus (PROBIOTIC ACIDOPHILUS) CAPS Take 1 tablet by mouth daily.     loratadine  (CLARITIN) 10 MG tablet Take 1 tablet (10 mg total) by mouth daily. 90 tablet 1   magnesium oxide (MAG-OX) 400 (240 Mg) MG tablet TAKE 1 TABLET BY MOUTH TWICE A DAY 60 tablet 2   mometasone (NASONEX) 50 MCG/ACT nasal spray PLACE 2 SPRAYS INTO THE NOSE DAILY. 51 each 3   omeprazole (PRILOSEC) 20 MG capsule TAKE 1 CAPSULE BY MOUTH EVERY DAY 90 capsule 1   raloxifene (EVISTA) 60 MG tablet Take 1 tablet (60 mg total) by mouth daily. 90 tablet 1   Tiotropium Bromide Monohydrate (SPIRIVA RESPIMAT) 2.5 MCG/ACT AERS INHALE 2 PUFFS BY MOUTH PER DAY 12 g 1   triamcinolone cream (KENALOG) 0.1 % Apply 1 application topically 2 (two) times daily. (Patient taking differently: Apply 1 application  topically daily as needed (yeast infection under breast).) 30 g 0   metoprolol succinate (TOPROL-XL) 50 MG 24 hr tablet TAKE 1 TABLET BY MOUTH TWICE A DAY 180 tablet 1   No facility-administered medications prior to visit.    ROS Review of Systems  Constitutional: Negative.  Negative for chills, diaphoresis, fatigue and fever.  HENT: Negative.    Eyes: Negative.   Respiratory:  Positive for cough, shortness of breath and wheezing. Negative for chest tightness.   Cardiovascular:  Negative for  chest pain, palpitations and leg swelling.  Gastrointestinal: Negative.  Negative for abdominal pain, diarrhea, nausea and vomiting.  Endocrine: Negative.   Genitourinary: Negative.  Negative for difficulty urinating.  Musculoskeletal:  Positive for arthralgias, back pain, gait problem and neck pain. Negative for myalgias.  Skin: Negative.   Neurological:  Positive for dizziness, tremors, weakness and light-headedness. Negative for syncope and numbness.  Hematological:  Negative for adenopathy. Does not bruise/bleed easily.  Psychiatric/Behavioral:  Negative for confusion, decreased concentration, dysphoric mood, self-injury and suicidal ideas. The patient is nervous/anxious.     Objective:  BP (!) 122/58 (BP Location:  Left Arm, Patient Position: Sitting, Cuff Size: Normal)   Pulse 70   Temp 98.1 F (36.7 C) (Oral)   Ht 5' (1.524 m)   Wt 152 lb (68.9 kg)   SpO2 93%   BMI 29.69 kg/m   BP Readings from Last 3 Encounters:  02/18/22 (!) 122/58  08/21/21 (!) 126/56  01/30/21 128/76    Wt Readings from Last 3 Encounters:  02/18/22 152 lb (68.9 kg)  08/21/21 156 lb (70.8 kg)  01/30/21 161 lb (73 kg)    Physical Exam Vitals reviewed.  HENT:     Nose: Nose normal.     Mouth/Throat:     Mouth: Mucous membranes are moist.  Eyes:     General: No scleral icterus.    Conjunctiva/sclera: Conjunctivae normal.  Cardiovascular:     Rate and Rhythm: Normal rate and regular rhythm.     Heart sounds: No murmur heard. Pulmonary:     Effort: Pulmonary effort is normal.     Breath sounds: No stridor. No wheezing, rhonchi or rales.  Abdominal:     General: Abdomen is flat.     Palpations: There is no mass.     Tenderness: There is no abdominal tenderness. There is no guarding.     Hernia: No hernia is present.  Musculoskeletal:        General: Normal range of motion.     Cervical back: Neck supple.     Right lower leg: No edema.     Left lower leg: No edema.  Lymphadenopathy:     Cervical: No cervical adenopathy.  Skin:    General: Skin is warm and dry.  Neurological:     General: No focal deficit present.     Mental Status: She is alert.  Psychiatric:        Mood and Affect: Mood normal.        Behavior: Behavior normal.     Lab Results  Component Value Date   WBC 7.5 08/21/2021   HGB 12.9 08/21/2021   HCT 40.9 08/21/2021   PLT 367.0 08/21/2021   GLUCOSE 90 08/21/2021   CHOL 145 08/21/2021   TRIG 207.0 (H) 08/21/2021   HDL 41.70 08/21/2021   LDLDIRECT 81.0 08/21/2021   LDLCALC 74 07/10/2020   ALT 10 08/21/2021   AST 18 08/21/2021   NA 137 08/21/2021   K 4.1 08/21/2021   CL 100 08/21/2021   CREATININE 0.98 08/21/2021   BUN 19 08/21/2021   CO2 33 (H) 08/21/2021   TSH 3.42  01/30/2021   HGBA1C 5.7 08/21/2021    MM 3D SCREEN BREAST BILATERAL  Result Date: 03/19/2021 CLINICAL DATA:  Screening. EXAM: DIGITAL SCREENING BILATERAL MAMMOGRAM WITH TOMOSYNTHESIS AND CAD TECHNIQUE: Bilateral screening digital craniocaudal and mediolateral oblique mammograms were obtained. Bilateral screening digital breast tomosynthesis was performed. The images were evaluated with computer-aided detection. COMPARISON:  Previous exam(s). ACR  Breast Density Category b: There are scattered areas of fibroglandular density. FINDINGS: There are no findings suspicious for malignancy. IMPRESSION: No mammographic evidence of malignancy. A result letter of this screening mammogram will be mailed directly to the patient. RECOMMENDATION: Screening mammogram in one year. (Code:SM-B-01Y) BI-RADS CATEGORY  1: Negative. Electronically Signed   By: Harmon Pier M.D.   On: 03/19/2021 09:17    Assessment & Plan:   Diagnoses and all orders for this visit:  HTN (hypertension), benign-  Her blood pressure is overcontrolled and she is symptomatic.  Will decrease the dose of metoprolol. -     Basic metabolic panel; Future -     TSH; Future -     CBC with Differential/Platelet; Future -     metoprolol succinate (TOPROL-XL) 50 MG 24 hr tablet; Take 1 tablet (50 mg total) by mouth daily. Take with or immediately following a meal.  Stage 3b chronic kidney disease (HCC)- Her renal function is stable and her blood pressure is adequately well controlled. -     Basic metabolic panel; Future  Gastroesophageal reflux disease without esophagitis -     CBC with Differential/Platelet; Future  Coarse tremors -     Ambulatory referral to Neurology   I have changed Jun Dupler's metoprolol succinate. I am also having her maintain her aspirin, Probiotic Acidophilus, acetaminophen, Garlic Oil, triamcinolone cream, Calcium Citrate-Vitamin D (CITRACAL + D PO), albuterol, gabapentin, loratadine, budesonide-formoterol, Spiriva  Respimat, magnesium oxide, mometasone, raloxifene, furosemide, ALPRAZolam, fenofibrate, omeprazole, citalopram, and colesevelam.  Meds ordered this encounter  Medications   metoprolol succinate (TOPROL-XL) 50 MG 24 hr tablet    Sig: Take 1 tablet (50 mg total) by mouth daily. Take with or immediately following a meal.    Dispense:  90 tablet    Refill:  1     Follow-up: No follow-ups on file.  Sanda Linger, MD

## 2022-02-18 NOTE — Progress Notes (Deleted)
Established Patient Office Visit  Subjective   Patient ID: Keyah Blizard, female    DOB: [birth year]  Age: 81 y.o. MRN: [id number]  No chief complaint on file.   HPI Patient reports BLE weakness with shaking and burning for the last 6 months which has progressively gotten worse. Patient reports falling 2 months ago (tripped over her cat). She has also experienced some lightheadedness and has to hold on to something to catch herself. She stated that she has also noticed tremors in her hands. Patient denies CP or SOB.  {History (Optional):23778}  Review of Systems  Constitutional:  Negative for chills and fever.  HENT:  Negative for congestion and sore throat.   Eyes:  Negative for blurred vision and double vision.  Respiratory:  Positive for cough (COPD) and shortness of breath. Negative for hemoptysis.   Cardiovascular:  Negative for chest pain and leg swelling.  Gastrointestinal:  Negative for abdominal pain, blood in stool, constipation, diarrhea, nausea and vomiting.  Musculoskeletal:  Positive for back pain and joint pain.  Skin:  Negative for rash.  Neurological:  Positive for dizziness, tremors (hands, L>R), weakness (BLE, burning and shaking) and headaches.  Psychiatric/Behavioral:  Negative for depression and suicidal ideas. The patient is nervous/anxious.       Objective:     BP (!) 122/58 (BP Location: Left Arm, Patient Position: Sitting, Cuff Size: Normal)   Pulse 70   Temp 98.1 F (36.7 C) (Oral)   Ht 5' (1.524 m)   Wt 152 lb (68.9 kg)   SpO2 93%   BMI 29.69 kg/m  BP Readings from Last 3 Encounters:  02/18/22 (!) 122/58  08/21/21 (!) 126/56  01/30/21 128/76   Wt Readings from Last 3 Encounters:  02/18/22 152 lb (68.9 kg)  08/21/21 156 lb (70.8 kg)  01/30/21 161 lb (73 kg)      Physical Exam HENT:     Head: Normocephalic.  Cardiovascular:     Rate and Rhythm: Normal rate and regular rhythm.     Heart sounds: Normal heart sounds.  Pulmonary:      Breath sounds: Normal breath sounds. No wheezing.  Abdominal:     General: Bowel sounds are normal.  Musculoskeletal:     Cervical back: Neck supple.  Skin:    General: Skin is warm.  Neurological:     Mental Status: She is alert and oriented to person, place, and time.  Psychiatric:        Mood and Affect: Mood normal.        Behavior: Behavior normal.      No results found for any visits on 02/18/22.  Last CBC Lab Results  Component Value Date   WBC 7.5 08/21/2021   HGB 12.9 08/21/2021   HCT 40.9 08/21/2021   MCV 84.4 08/21/2021   MCH 28.6 10/27/2020   RDW 16.9 (H) 08/21/2021   PLT 367.0 85/27/7824   Last metabolic panel Lab Results  Component Value Date   GLUCOSE 90 08/21/2021   NA 137 08/21/2021   K 4.1 08/21/2021   CL 100 08/21/2021   CO2 33 (H) 08/21/2021   BUN 19 08/21/2021   CREATININE 0.98 08/21/2021   GFRNONAA 59 (L) 10/29/2020   CALCIUM 8.7 08/21/2021   PROT 6.6 08/21/2021   ALBUMIN 3.2 (L) 08/21/2021   BILITOT 0.4 08/21/2021   ALKPHOS 47 08/21/2021   AST 18 08/21/2021   ALT 10 08/21/2021   ANIONGAP 7 10/27/2020      The ASCVD Risk score (  Arnett DK, et al., 2019) failed to calculate for the following reasons:   The 2019 ASCVD risk score is only valid for ages 86 to 57   The patient has a prior MI or stroke diagnosis    Assessment & Plan:   Problem List Items Addressed This Visit       Cardiovascular and Mediastinum   HTN (hypertension), benign - Primary   Relevant Medications   metoprolol succinate (TOPROL-XL) 50 MG 24 hr tablet   Other Relevant Orders   Basic metabolic panel   TSH   CBC with Differential/Platelet     Digestive   Gastroesophageal reflux disease without esophagitis   Relevant Orders   CBC with Differential/Platelet     Genitourinary   Stage 3b chronic kidney disease ([facility])   Relevant Orders   Basic metabolic panel    No follow-ups on file.    Glade Nurse, RN

## 2022-02-20 ENCOUNTER — Encounter: Payer: Self-pay | Admitting: Internal Medicine

## 2022-02-21 ENCOUNTER — Other Ambulatory Visit (INDEPENDENT_AMBULATORY_CARE_PROVIDER_SITE_OTHER): Payer: Medicare Other

## 2022-02-21 DIAGNOSIS — I10 Essential (primary) hypertension: Secondary | ICD-10-CM | POA: Diagnosis not present

## 2022-02-21 DIAGNOSIS — N1832 Chronic kidney disease, stage 3b: Secondary | ICD-10-CM

## 2022-02-21 DIAGNOSIS — K219 Gastro-esophageal reflux disease without esophagitis: Secondary | ICD-10-CM

## 2022-02-21 LAB — CBC WITH DIFFERENTIAL/PLATELET
Basophils Absolute: 0.1 10*3/uL (ref 0.0–0.1)
Basophils Relative: 0.7 % (ref 0.0–3.0)
Eosinophils Absolute: 0.3 10*3/uL (ref 0.0–0.7)
Eosinophils Relative: 2.7 % (ref 0.0–5.0)
HCT: 35.3 % — ABNORMAL LOW (ref 36.0–46.0)
Hemoglobin: 11.3 g/dL — ABNORMAL LOW (ref 12.0–15.0)
Lymphocytes Relative: 12.6 % (ref 12.0–46.0)
Lymphs Abs: 1.3 10*3/uL (ref 0.7–4.0)
MCHC: 31.9 g/dL (ref 30.0–36.0)
MCV: 76.2 fl — ABNORMAL LOW (ref 78.0–100.0)
Monocytes Absolute: 1.1 10*3/uL — ABNORMAL HIGH (ref 0.1–1.0)
Monocytes Relative: 10.5 % (ref 3.0–12.0)
Neutro Abs: 7.4 10*3/uL (ref 1.4–7.7)
Neutrophils Relative %: 73.5 % (ref 43.0–77.0)
Platelets: 545 10*3/uL — ABNORMAL HIGH (ref 150.0–400.0)
RBC: 4.63 Mil/uL (ref 3.87–5.11)
RDW: 17.4 % — ABNORMAL HIGH (ref 11.5–15.5)
WBC: 10.1 10*3/uL (ref 4.0–10.5)

## 2022-02-21 LAB — BASIC METABOLIC PANEL
BUN: 12 mg/dL (ref 6–23)
CO2: 29 mEq/L (ref 19–32)
Calcium: 9.4 mg/dL (ref 8.4–10.5)
Chloride: 97 mEq/L (ref 96–112)
Creatinine, Ser: 0.86 mg/dL (ref 0.40–1.20)
GFR: 63.75 mL/min (ref >60.00)
Glucose, Bld: 116 mg/dL — ABNORMAL HIGH (ref 70–99)
Potassium: 3.7 mEq/L (ref 3.5–5.1)
Sodium: 134 mEq/L — ABNORMAL LOW (ref 135–145)

## 2022-02-21 LAB — TSH: TSH: 2.6 u[IU]/mL (ref 0.35–5.50)

## 2022-02-25 ENCOUNTER — Encounter: Payer: Self-pay | Admitting: Neurology

## 2022-02-26 ENCOUNTER — Encounter: Payer: Self-pay | Admitting: Internal Medicine

## 2022-02-26 ENCOUNTER — Ambulatory Visit (INDEPENDENT_AMBULATORY_CARE_PROVIDER_SITE_OTHER): Payer: Medicare Other | Admitting: Internal Medicine

## 2022-02-26 VITALS — BP 136/66 | HR 73 | Temp 97.8°F | Ht 60.0 in | Wt 153.0 lb

## 2022-02-26 DIAGNOSIS — D510 Vitamin B12 deficiency anemia due to intrinsic factor deficiency: Secondary | ICD-10-CM | POA: Diagnosis not present

## 2022-02-26 DIAGNOSIS — D508 Other iron deficiency anemias: Secondary | ICD-10-CM | POA: Diagnosis not present

## 2022-02-26 DIAGNOSIS — D520 Dietary folate deficiency anemia: Secondary | ICD-10-CM | POA: Insufficient documentation

## 2022-02-26 DIAGNOSIS — D539 Nutritional anemia, unspecified: Secondary | ICD-10-CM | POA: Diagnosis not present

## 2022-02-26 DIAGNOSIS — D538 Other specified nutritional anemias: Secondary | ICD-10-CM | POA: Diagnosis not present

## 2022-02-26 DIAGNOSIS — D75839 Thrombocytosis, unspecified: Secondary | ICD-10-CM | POA: Diagnosis not present

## 2022-02-26 LAB — CBC WITH DIFFERENTIAL/PLATELET
Basophils Absolute: 0 10*3/uL (ref 0.0–0.1)
Basophils Relative: 0.4 % (ref 0.0–3.0)
Eosinophils Absolute: 0.2 10*3/uL (ref 0.0–0.7)
Eosinophils Relative: 2.1 % (ref 0.0–5.0)
HCT: 36.7 % (ref 36.0–46.0)
Hemoglobin: 11.4 g/dL — ABNORMAL LOW (ref 12.0–15.0)
Lymphocytes Relative: 11.3 % — ABNORMAL LOW (ref 12.0–46.0)
Lymphs Abs: 1.2 10*3/uL (ref 0.7–4.0)
MCHC: 31.1 g/dL (ref 30.0–36.0)
MCV: 76.9 fl — ABNORMAL LOW (ref 78.0–100.0)
Monocytes Absolute: 1.1 10*3/uL — ABNORMAL HIGH (ref 0.1–1.0)
Monocytes Relative: 10.7 % (ref 3.0–12.0)
Neutro Abs: 8.1 10*3/uL — ABNORMAL HIGH (ref 1.4–7.7)
Neutrophils Relative %: 75.5 % (ref 43.0–77.0)
Platelets: 560 10*3/uL — ABNORMAL HIGH (ref 150.0–400.0)
RBC: 4.78 Mil/uL (ref 3.87–5.11)
RDW: 17.7 % — ABNORMAL HIGH (ref 11.5–15.5)
WBC: 10.7 10*3/uL — ABNORMAL HIGH (ref 4.0–10.5)

## 2022-02-26 LAB — IBC + FERRITIN
Ferritin: 68.4 ng/mL (ref 10.0–291.0)
Iron: 22 ug/dL — ABNORMAL LOW (ref 42–145)
Saturation Ratios: 5.4 % — ABNORMAL LOW (ref 20.0–50.0)
TIBC: 406 ug/dL (ref 250.0–450.0)
Transferrin: 290 mg/dL (ref 212.0–360.0)

## 2022-02-26 LAB — FOLATE: Folate: 4.4 ng/mL — ABNORMAL LOW (ref >5.9)

## 2022-02-26 LAB — VITAMIN B12: Vitamin B-12: 163 pg/mL — ABNORMAL LOW (ref 211–911)

## 2022-02-26 MED ORDER — ACCRUFER 30 MG PO CAPS: 1.0000 | ORAL_CAPSULE | Freq: Two times a day (BID) | ORAL | 0 refills | Status: DC

## 2022-02-26 MED ORDER — FOLIC ACID 1 MG PO TABS: 1.0000 mg | ORAL_TABLET | Freq: Every day | ORAL | 1 refills | Status: DC

## 2022-02-26 NOTE — Patient Instructions (Signed)
Anemia  Anemia is a condition in which there is not enough red blood cells or hemoglobin in the blood. Hemoglobin is a substance in red blood cells that carries oxygen. When you do not have enough red blood cells or hemoglobin (are anemic), your body cannot get enough oxygen and your organs may not work properly. As a result, you may feel very tired or have other problems. What are the causes? Common causes of anemia include: Excessive bleeding. Anemia can be caused by excessive bleeding inside or outside the body, including bleeding from the intestines or from heavy menstrual periods in females. Poor nutrition. Long-lasting (chronic) kidney, thyroid, and liver disease. Bone marrow disorders, spleen problems, and blood disorders. Cancer and treatments for cancer. HIV (human immunodeficiency virus) and AIDS (acquired immunodeficiency syndrome). Infections, medicines, and autoimmune disorders that destroy red blood cells. What are the signs or symptoms? Symptoms of this condition include: Minor weakness. Dizziness. Headache, or difficulties concentrating and sleeping. Heartbeats that feel irregular or faster than normal (palpitations). Shortness of breath, especially with exercise. Pale skin, lips, and nails, or cold hands and feet. Indigestion and nausea. Symptoms may occur suddenly or develop slowly. If your anemia is mild, you may not have symptoms. How is this diagnosed? This condition is diagnosed based on blood tests, your medical history, and a physical exam. In some cases, a test may be needed in which cells are removed from the soft tissue inside of a bone and looked at under a microscope (bone marrow biopsy). Your health care provider may also check your stool (feces) for blood and may do additional testing to look for the cause of your bleeding. Other tests may include: Imaging tests, such as a CT scan or MRI. A procedure to see inside your esophagus and stomach (endoscopy). A  procedure to see inside your colon and rectum (colonoscopy). How is this treated? Treatment for this condition depends on the cause. If you continue to lose a lot of blood, you may need to be treated at a hospital. Treatment may include: Taking supplements of iron, vitamin B12, or folic acid. Taking a hormone medicine (erythropoietin) that can help to stimulate red blood cell growth. Having a blood transfusion. This may be needed if you lose a lot of blood. Making changes to your diet. Having surgery to remove your spleen. Follow these instructions at home: Take over-the-counter and prescription medicines only as told by your health care provider. Take supplements only as told by your health care provider. Follow any diet instructions that you were given by your health care provider. Keep all follow-up visits as told by your health care provider. This is important. Contact a health care provider if: You develop new bleeding anywhere in the body. Get help right away if: You are very weak. You are short of breath. You have pain in your abdomen or chest. You are dizzy or feel faint. You have trouble concentrating. You have bloody stools, black stools, or tarry stools. You vomit repeatedly or you vomit up blood. These symptoms may represent a serious problem that is an emergency. Do not wait to see if the symptoms will go away. Get medical help right away. Call your local emergency services (911 in the [place] Do not drive yourself to the hospital. Summary Anemia is a condition in which you do not have enough red blood cells or enough of a substance in your red blood cells that carries oxygen (hemoglobin). Symptoms may occur suddenly or develop slowly. If your anemia   is mild, you may not have symptoms. This condition is diagnosed with blood tests, a medical history, and a physical exam. Other tests may be needed. Treatment for this condition depends on the cause of the anemia. This  information is not intended to replace advice given to you by your health care provider. Make sure you discuss any questions you have with your health care provider. Document Revised: 05/28/2021 Document Reviewed: 06/21/2019 Elsevier Patient Education  2023 [employer] Inc[employer]  

## 2022-02-26 NOTE — Progress Notes (Signed)
Subjective:  Patient ID: Courtney Brown, female    DOB: [birth year]  Age: 81 y.o. MRN: [id number]  CC: Anemia   HPI Courtney Brown presents for f/up -  She complains of weakness and easy bruising but denies lightheadedness, or, bleeding.  Outpatient Medications Prior to Visit  Medication Sig Dispense Refill   acetaminophen (TYLENOL) 500 MG tablet Take 500 mg by mouth every 6 (six) hours as needed for mild pain or moderate pain.     albuterol (VENTOLIN HFA) 108 (90 Base) MCG/ACT inhaler INHALE 1-2 PUFFS BY MOUTH EVERY 6 HOURS AS NEEDED FOR WHEEZE OR SHORTNESS OF BREATH 6.7 each 1   ALPRAZolam (XANAX) 0.25 MG tablet Take 1 tablet (0.25 mg total) by mouth 2 (two) times daily as needed for anxiety. 30 tablet 4   aspirin 81 MG tablet Take 81 mg by mouth at bedtime.     budesonide-formoterol (SYMBICORT) 160-4.5 MCG/ACT inhaler TAKE 2 PUFFS BY MOUTH TWICE A DAY 30.6 each 1   Calcium Citrate-Vitamin D (CITRACAL + D PO) Take 1 tablet by mouth 2 (two) times daily. 600 mg calcium     colesevelam (WELCHOL) 625 MG tablet TAKE 1 TABLET (625 MG TOTAL) BY MOUTH 2 (TWO) TIMES DAILY WITH A MEAL. 180 tablet 1   fenofibrate (TRICOR) 145 MG tablet TAKE 1 TABLET BY MOUTH EVERY DAY 90 tablet 1   furosemide (LASIX) 20 MG tablet TAKE 1 TABLET BY MOUTH DAILY FOR LEG EDEMA 90 tablet 1   gabapentin (NEURONTIN) 100 MG capsule Take 1 capsule (100 mg total) by mouth 2 (two) times daily. 725 capsule 1   Garlic Oil 3664 MG CAPS Take 1,000 mg by mouth daily.     Lactobacillus (PROBIOTIC ACIDOPHILUS) CAPS Take 1 tablet by mouth daily.     loratadine (CLARITIN) 10 MG tablet Take 1 tablet (10 mg total) by mouth daily. 90 tablet 1   magnesium oxide (MAG-OX) 400 (240 Mg) MG tablet TAKE 1 TABLET BY MOUTH TWICE A DAY 60 tablet 2   metoprolol succinate (TOPROL-XL) 50 MG 24 hr tablet Take 1 tablet (50 mg total) by mouth daily. Take with or immediately following a meal. 90 tablet 1   mometasone (NASONEX) 50 MCG/ACT nasal spray PLACE  2 SPRAYS INTO THE NOSE DAILY. 51 each 3   omeprazole (PRILOSEC) 20 MG capsule TAKE 1 CAPSULE BY MOUTH EVERY DAY 90 capsule 1   raloxifene (EVISTA) 60 MG tablet Take 1 tablet (60 mg total) by mouth daily. 90 tablet 1   Tiotropium Bromide Monohydrate (SPIRIVA RESPIMAT) 2.5 MCG/ACT AERS INHALE 2 PUFFS BY MOUTH PER DAY 12 g 1   triamcinolone cream (KENALOG) 0.1 % Apply 1 application topically 2 (two) times daily. (Patient taking differently: Apply 1 application  topically daily as needed (yeast infection under breast).) 30 g 0   citalopram (CELEXA) 20 MG tablet TAKE 1 TABLET BY MOUTH EVERY DAY 90 tablet 1   No facility-administered medications prior to visit.    ROS Review of Systems  Constitutional: Negative.  Negative for diaphoresis and fatigue.  HENT: Negative.    Eyes: Negative.   Respiratory:  Negative for chest tightness, shortness of breath and wheezing.   Cardiovascular:  Negative for chest pain, palpitations and leg swelling.  Gastrointestinal:  Negative for abdominal pain, diarrhea, nausea and vomiting.  Endocrine: Negative.   Genitourinary: Negative.   Musculoskeletal: Negative.   Skin: Negative.  Negative for color change.  Neurological:  Positive for weakness. Negative for dizziness, light-headedness and numbness.  Hematological:  Negative for adenopathy. Does not bruise/bleed easily.  Psychiatric/Behavioral: Negative.      Objective:  BP 136/66 (BP Location: Left Arm, Patient Position: Sitting, Cuff Size: Large)   Pulse 73   Temp 97.8 F (36.6 C) (Oral)   Ht 5' (1.524 m)   Wt 153 lb (69.4 kg)   SpO2 92%   BMI 29.88 kg/m   BP Readings from Last 3 Encounters:  02/26/22 136/66  02/18/22 (!) 122/58  08/21/21 (!) 126/56    Wt Readings from Last 3 Encounters:  02/26/22 153 lb (69.4 kg)  02/18/22 152 lb (68.9 kg)  08/21/21 156 lb (70.8 kg)    Physical Exam  Lab Results  Component Value Date   WBC 10.7 (H) 02/26/2022   HGB 11.4 (L) 02/26/2022   HCT 36.7  02/26/2022   PLT 560.0 (H) 02/26/2022   GLUCOSE 116 (H) 02/21/2022   CHOL 145 08/21/2021   TRIG 207.0 (H) 08/21/2021   HDL 41.70 08/21/2021   LDLDIRECT 81.0 08/21/2021   LDLCALC 74 07/10/2020   ALT 10 08/21/2021   AST 18 08/21/2021   NA 134 (L) 02/21/2022   K 3.7 02/21/2022   CL 97 02/21/2022   CREATININE 0.86 02/21/2022   BUN 12 02/21/2022   CO2 29 02/21/2022   TSH 2.60 02/21/2022   HGBA1C 5.7 08/21/2021    MM 3D SCREEN BREAST BILATERAL  Result Date: 03/19/2021 CLINICAL DATA:  Screening. EXAM: DIGITAL SCREENING BILATERAL MAMMOGRAM WITH TOMOSYNTHESIS AND CAD TECHNIQUE: Bilateral screening digital craniocaudal and mediolateral oblique mammograms were obtained. Bilateral screening digital breast tomosynthesis was performed. The images were evaluated with computer-aided detection. COMPARISON:  Previous exam(s). ACR Breast Density Category b: There are scattered areas of fibroglandular density. FINDINGS: There are no findings suspicious for malignancy. IMPRESSION: No mammographic evidence of malignancy. A result letter of this screening mammogram will be mailed directly to the patient. RECOMMENDATION: Screening mammogram in one year. (Code:SM-B-01Y) BI-RADS CATEGORY  1: Negative. Electronically Signed   By: Margarette Canada M.D.   On: 03/19/2021 09:17    Assessment & Plan:   Jenisa was seen today for anemia.  Diagnoses and all orders for this visit:  Deficiency anemia- I will treat her vitamin deficiencies. -     IBC + Ferritin; Future -     Reticulocytes; Future -     Vitamin B1; Future -     Zinc; Future -     Folate; Future -     Vitamin B12; Future -     CBC with Differential/Platelet; Future -     CBC with Differential/Platelet -     IBC + Ferritin -     Vitamin B12 -     Folate -     Reticulocytes -     Zinc -     Vitamin B1  Thrombocytosis -     IBC + Ferritin; Future -     CBC with Differential/Platelet; Future -     CBC with Differential/Platelet -     IBC +  Ferritin -     Ferric Maltol (ACCRUFER) 30 MG CAPS; Take 1 capsule by mouth in the morning and at bedtime.  Iron deficiency anemia secondary to inadequate dietary iron intake -     Ferric Maltol (ACCRUFER) 30 MG CAPS; Take 1 capsule by mouth in the morning and at bedtime.  Vitamin B12 deficiency anemia due to intrinsic factor deficiency -     folic acid (FOLVITE) 1 MG tablet; Take 1 tablet (1 mg total)  by mouth daily.  Dietary folate deficiency anemia -     folic acid (FOLVITE) 1 MG tablet; Take 1 tablet (1 mg total) by mouth daily.  Anemia due to zinc deficiency -     zinc gluconate 50 MG tablet; Take 1 tablet (50 mg total) by mouth daily.   I have discontinued Sheli Blick's citalopram. I am also having her start on ACCRUFeR, folic acid, and zinc gluconate. Additionally, I am having her maintain her aspirin, Probiotic Acidophilus, acetaminophen, Garlic Oil, triamcinolone cream, Calcium Citrate-Vitamin D (CITRACAL + D PO), albuterol, gabapentin, loratadine, budesonide-formoterol, Spiriva Respimat, magnesium oxide, mometasone, raloxifene, furosemide, ALPRAZolam, fenofibrate, omeprazole, colesevelam, and metoprolol succinate.  Meds ordered this encounter  Medications   Ferric Maltol (ACCRUFER) 30 MG CAPS    Sig: Take 1 capsule by mouth in the morning and at bedtime.    Dispense:  180 capsule    Refill:  0   folic acid (FOLVITE) 1 MG tablet    Sig: Take 1 tablet (1 mg total) by mouth daily.    Dispense:  90 tablet    Refill:  1   zinc gluconate 50 MG tablet    Sig: Take 1 tablet (50 mg total) by mouth daily.    Dispense:  90 tablet    Refill:  1     Follow-up: Return in about 3 months (around 05/29/2022).  Scarlette Calico, MD

## 2022-03-01 ENCOUNTER — Other Ambulatory Visit: Payer: Self-pay | Admitting: Internal Medicine

## 2022-03-01 DIAGNOSIS — D538 Other specified nutritional anemias: Secondary | ICD-10-CM | POA: Insufficient documentation

## 2022-03-01 LAB — RETICULOCYTES
ABS Retic: 85680 cells/uL — ABNORMAL HIGH (ref 20000–80000)
Retic Ct Pct: 1.8 %

## 2022-03-01 LAB — ZINC: Zinc: 51 ug/dL — ABNORMAL LOW (ref 60–130)

## 2022-03-01 LAB — VITAMIN B1: Vitamin B1 (Thiamine): 10 nmol/L (ref 8–30)

## 2022-03-01 MED ORDER — ZINC GLUCONATE 50 MG PO TABS: 50.0000 mg | ORAL_TABLET | Freq: Every day | ORAL | 1 refills | Status: DC

## 2022-03-03 ENCOUNTER — Ambulatory Visit (INDEPENDENT_AMBULATORY_CARE_PROVIDER_SITE_OTHER): Payer: Medicare Other | Admitting: *Deleted

## 2022-03-03 DIAGNOSIS — D510 Vitamin B12 deficiency anemia due to intrinsic factor deficiency: Secondary | ICD-10-CM

## 2022-03-03 MED ORDER — CYANOCOBALAMIN 1000 MCG/ML IJ SOLN
1000.0000 ug | Freq: Once | INTRAMUSCULAR | Status: AC
  Administered 2022-03-03: 1000 ug via INTRAMUSCULAR

## 2022-03-03 NOTE — Progress Notes (Signed)
Pls cosign for B12 inj../lmb  

## 2022-03-05 ENCOUNTER — Ambulatory Visit: Payer: Medicare Other

## 2022-03-10 ENCOUNTER — Ambulatory Visit (INDEPENDENT_AMBULATORY_CARE_PROVIDER_SITE_OTHER): Payer: Medicare Other

## 2022-03-10 DIAGNOSIS — D510 Vitamin B12 deficiency anemia due to intrinsic factor deficiency: Secondary | ICD-10-CM

## 2022-03-10 MED ORDER — CYANOCOBALAMIN 1000 MCG/ML IJ SOLN
1000.0000 ug | Freq: Once | INTRAMUSCULAR | Status: AC
  Administered 2022-03-10: 1000 ug via INTRAMUSCULAR

## 2022-03-10 NOTE — Progress Notes (Signed)
Patient here for weekly B12 injection per Dr. Ronnald Ramp. B12 1000 mcg given in left IM and patient tolerated injection well today.

## 2022-03-17 ENCOUNTER — Ambulatory Visit (INDEPENDENT_AMBULATORY_CARE_PROVIDER_SITE_OTHER): Payer: Medicare Other

## 2022-03-17 DIAGNOSIS — D510 Vitamin B12 deficiency anemia due to intrinsic factor deficiency: Secondary | ICD-10-CM

## 2022-03-17 MED ORDER — CYANOCOBALAMIN 1000 MCG/ML IJ SOLN
1000.0000 ug | Freq: Once | INTRAMUSCULAR | Status: AC
  Administered 2022-03-17: 1000 ug via INTRAMUSCULAR

## 2022-03-17 NOTE — Progress Notes (Signed)
After obtaining consent, and per orders of Dr. Jones, injection of B12 given on the left deltoid by Woodrow Drab P Noeli Lavery. Patient instructed to report any adverse reaction to me immediately.  

## 2022-03-24 ENCOUNTER — Ambulatory Visit (INDEPENDENT_AMBULATORY_CARE_PROVIDER_SITE_OTHER): Payer: Medicare Other

## 2022-03-24 DIAGNOSIS — D510 Vitamin B12 deficiency anemia due to intrinsic factor deficiency: Secondary | ICD-10-CM

## 2022-03-24 MED ORDER — CYANOCOBALAMIN 1000 MCG/ML IJ SOLN
1000.0000 ug | INTRAMUSCULAR | Status: DC
  Administered 2022-03-24: 1000 ug via INTRAMUSCULAR

## 2022-03-24 NOTE — Progress Notes (Signed)
B12 given Please cosign 

## 2022-03-26 ENCOUNTER — Telehealth: Payer: Self-pay | Admitting: Internal Medicine

## 2022-03-26 ENCOUNTER — Other Ambulatory Visit: Payer: Self-pay | Admitting: Internal Medicine

## 2022-03-26 DIAGNOSIS — J449 Chronic obstructive pulmonary disease, unspecified: Secondary | ICD-10-CM

## 2022-03-26 NOTE — Telephone Encounter (Signed)
Pt currently taking Ferric Maltol (ACCRUFER) 30 MG CAPS. Is it something comparable OTC the pt can take due to price? Please advise in PCPs absence. Thank you.

## 2022-03-26 NOTE — Telephone Encounter (Signed)
Patient wants to know if they can take an over the counter iron supplement - her rx is too high.  Please advise.

## 2022-03-27 NOTE — Telephone Encounter (Signed)
LVM to discuss iron Rx.

## 2022-03-27 NOTE — Telephone Encounter (Signed)
Any otc iron would be fine 1 pill daily. Iron gluconate has least side effects.

## 2022-04-21 ENCOUNTER — Ambulatory Visit (INDEPENDENT_AMBULATORY_CARE_PROVIDER_SITE_OTHER): Payer: Medicare Other

## 2022-04-21 DIAGNOSIS — D510 Vitamin B12 deficiency anemia due to intrinsic factor deficiency: Secondary | ICD-10-CM

## 2022-04-21 MED ORDER — CYANOCOBALAMIN 1000 MCG/ML IJ SOLN
1000.0000 ug | Freq: Once | INTRAMUSCULAR | Status: AC
  Administered 2022-04-21: 1000 ug via INTRAMUSCULAR

## 2022-04-21 NOTE — Progress Notes (Signed)
After obtaining consent, and per orders of Dr. Jones, injection of B12 given in the left deltoid by Kamaron Deskins P Eulalah Rupert. Patient instructed to report any adverse reaction to me immediately.  

## 2022-04-30 ENCOUNTER — Other Ambulatory Visit: Payer: Self-pay | Admitting: Internal Medicine

## 2022-04-30 DIAGNOSIS — J449 Chronic obstructive pulmonary disease, unspecified: Secondary | ICD-10-CM

## 2022-05-01 ENCOUNTER — Other Ambulatory Visit: Payer: Self-pay | Admitting: Internal Medicine

## 2022-05-01 DIAGNOSIS — J301 Allergic rhinitis due to pollen: Secondary | ICD-10-CM

## 2022-05-05 NOTE — Progress Notes (Unsigned)
Initial neurology clinic note  Courtney Brown MRN: [id number] DOB: August 04, 1940  Referring provider: Janith Lima, MD  Primary care provider: Janith Lima, MD  Reason for consult:  Tremors***  Subjective:  This is [name], a 81 y.o. ***-handed female with a medical history of HTN, HLD, CAD, CKD3, COPD, OA, GERD, and anxiety*** who presents to neurology clinic with ***. The patient is accompanied by ***.  ***  Patient was seen by her PCP, Dr. Ronnald Ramp, on 02/18/22 and reported progressive bilateral lower extremity weakness with shaking and burning for 6 months. She had a fall in May 2023 when she tripped over her cat. She also mentioned lightheadedness and tremors in her hands. She was referred to neurology for further evaluation.  On B12 1000 mcg? ***  MEDICATIONS:  Outpatient Encounter Medications as of 05/07/2022  Medication Sig   acetaminophen (TYLENOL) 500 MG tablet Take 500 mg by mouth every 6 (six) hours as needed for mild pain or moderate pain.   albuterol (VENTOLIN HFA) 108 (90 Base) MCG/ACT inhaler INHALE 1-2 PUFFS BY MOUTH EVERY 6 HOURS AS NEEDED FOR WHEEZE OR SHORTNESS OF BREATH   ALPRAZolam (XANAX) 0.25 MG tablet Take 1 tablet (0.25 mg total) by mouth 2 (two) times daily as needed for anxiety.   aspirin 81 MG tablet Take 81 mg by mouth at bedtime.   budesonide-formoterol (SYMBICORT) 160-4.5 MCG/ACT inhaler TAKE 2 PUFFS BY MOUTH TWICE A DAY   Calcium Citrate-Vitamin D (CITRACAL + D PO) Take 1 tablet by mouth 2 (two) times daily. 600 mg calcium   colesevelam (WELCHOL) 625 MG tablet TAKE 1 TABLET (625 MG TOTAL) BY MOUTH 2 (TWO) TIMES DAILY WITH A MEAL.   fenofibrate (TRICOR) 145 MG tablet TAKE 1 TABLET BY MOUTH EVERY DAY   Ferric Maltol (ACCRUFER) 30 MG CAPS Take 1 capsule by mouth in the morning and at bedtime.   folic acid (FOLVITE) 1 MG tablet Take 1 tablet (1 mg total) by mouth daily.   furosemide (LASIX) 20 MG tablet TAKE 1 TABLET BY MOUTH DAILY FOR LEG  EDEMA   gabapentin (NEURONTIN) 100 MG capsule Take 1 capsule (100 mg total) by mouth 2 (two) times daily.   Garlic Oil 9702 MG CAPS Take 1,000 mg by mouth daily.   Lactobacillus (PROBIOTIC ACIDOPHILUS) CAPS Take 1 tablet by mouth daily.   loratadine (CLARITIN) 10 MG tablet TAKE 1 TABLET BY MOUTH EVERY DAY   magnesium oxide (MAG-OX) 400 (240 Mg) MG tablet TAKE 1 TABLET BY MOUTH TWICE A DAY   metoprolol succinate (TOPROL-XL) 50 MG 24 hr tablet Take 1 tablet (50 mg total) by mouth daily. Take with or immediately following a meal.   mometasone (NASONEX) 50 MCG/ACT nasal spray PLACE 2 SPRAYS INTO THE NOSE DAILY.   omeprazole (PRILOSEC) 20 MG capsule TAKE 1 CAPSULE BY MOUTH EVERY DAY   raloxifene (EVISTA) 60 MG tablet Take 1 tablet (60 mg total) by mouth daily.   SPIRIVA RESPIMAT 2.5 MCG/ACT AERS INHALE 2 PUFFS BY MOUTH PER DAY   triamcinolone cream (KENALOG) 0.1 % Apply 1 application topically 2 (two) times daily. (Patient taking differently: Apply 1 application  topically daily as needed (yeast infection under breast).)   zinc gluconate 50 MG tablet Take 1 tablet (50 mg total) by mouth daily.   Facility-Administered Encounter Medications as of 05/07/2022  Medication   cyanocobalamin (VITAMIN B12) injection 1,000 mcg    PAST MEDICAL HISTORY: Past Medical History:  Diagnosis Date   Allergy  Anxiety    Arthritis    Cancer ([place])    hx of melanoma    Colon polyps    COPD (chronic obstructive pulmonary disease) (HCC)    Coronary artery disease    Dyspnea    with exertion    Edema    in lower extremties    Emphysema of lung (HCC)    GERD (gastroesophageal reflux disease)    H/O hiatal hernia    Hyperlipidemia    Hypertension    Inverted nipple    bilateral, pt says that's normal   Myocardial infarct, old 2008   no stent placed   Osteoporosis    Pneumonia    hx of    Sepsis ([place])    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    Stroke  [facility])    TIA    PAST SURGICAL HISTORY: Past Surgical History:  Procedure Laterality Date   [place]  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   HERNIA REPAIR     umbilical, ventral   INCISIONAL HERNIA REPAIR N/A 10/26/2020   Procedure: DIAGNOSTIC LAPAROSCOPY MESH EXPLANTATION; SMALL BOWEL RESECTION; PRIMARY REPAIR OF INCISIONAL HERNIA;  Surgeon: Kinsinger, [name], MD;  Location: [facility];  Service: General;  Laterality: N/A;   INSERTION OF ILIAC STENT  2008    ALLERGIES: Allergies  Allergen Reactions   Prednisone Other (See Comments)    Leg pain,light headed   Statins     Myalgias- per patient, she feels better off the medication    FAMILY HISTORY: Family History  Problem Relation Age of Onset   Hearing loss Father    Hearing loss Maternal Aunt    Hearing loss Maternal Grandmother    Cancer Maternal Grandmother        colon   Arthritis Mother    Cancer Paternal Grandmother        colon   Breast cancer Neg Hx     SOCIAL HISTORY: Social History   Tobacco Use   Smoking status: Former    Packs/day: 1.50    Years: 44.00    Total pack years: 66.00    Types: Cigarettes    Quit date: 07/28/2005    Years since quitting: 16.7   Smokeless tobacco: Never  Vaping Use   Vaping Use: Never used  Substance Use Topics   Alcohol use: Yes    Alcohol/week: 2.0 standard drinks of alcohol    Types: 2 Shots of liquor per week    Comment: occas    Drug use: No   Social History   Social History Narrative   Not on file    Objective:  Vital Signs:  There were no vitals taken for this visit.  ***  Labs and Imaging review: Internal labs: Lab Results  Component Value Date   HGBA1C 5.7 08/21/2021   Lab Results  Component Value Date   VITAMINB12 163 (L) 02/26/2022   Lab Results  Component Value Date   TSH 2.60 02/21/2022   No results found for: "ESRSEDRATE", "POCTSEDRATE"  Normal or unremarkable: B1, BMP Zinc low at 51 Folate low at  4.4 Iron low at 22 CBC significant for thrombocytosis (560)  ***  External labs: ***  Imaging: CT cervical spine wo contrast (08/31/19): FINDINGS: Alignment: No significant listhesis.   Skull base and vertebrae: No acute fracture. Vertebral body heights are maintained apart from degenerative endplate irregularity at C5-C6 to C7-T1. No destructive  osseous lesion.   Soft tissues and spinal canal: No prevertebral fluid or swelling. No visible canal hematoma.   Disc levels: Degenerative changes are present primarily at C5-C6 and C6-C7. There is no high-grade osseous encroachment on the spinal canal.   Upper chest: Emphysema.   Other: None.   IMPRESSION: No acute cervical spine fracture.  CT head wo contrast (08/31/19): FINDINGS: Brain: There is no acute intracranial hemorrhage, mass-effect, or edema. Gray-white differentiation is preserved. There is no extra-axial fluid collection. Patchy hypoattenuation in the supratentorial white matter is nonspecific but may reflect mild to moderate chronic microvascular ischemic changes. Ventricles and sulci are within normal limits in size and configuration for patient age.   Vascular: There is atherosclerotic calcification at the skull base.   Skull: Calvarium is unremarkable.   Sinuses/Orbits: Minor mucosal thickening. Evidence of chronic right maxillary sinus inflammation. Orbits are unremarkable.   Other: None.   IMPRESSION: No evidence of acute intracranial injury. Chronic/nonemergent findings detailed above.  Lumbar spine xray (09/01/17): FINDINGS: Six non-rib-bearing lumbar type segments.   Bones appear mildly demineralized.   Vertebral body and disc space heights maintained.   Facet degenerative changes lower lumbar spine.   Scattered mild endplate spur formation.   No acute fracture, subluxation or bone destruction.   No spondylolysis.   Extensive atherosclerotic calcifications of the aorta and  iliac arteries with stents identified at the RIGHT common and external iliac arteries.   SI joints preserved.   IMPRESSION: Degenerative changes of the lumbar spine.   No acute abnormalities. ***  Assessment/Plan:  Courtney Brown is a 81 y.o. female who presents for evaluation of ***. *** has a relevant medical history of ***. *** neurological examination is pertinent for ***. Available diagnostic data is significant for ***. This constellation of symptoms and objective data would most likely localize to ***. ***  PLAN: -Blood work: *** ***  -Return to clinic ***  The impression above as well as the plan as outlined below were extensively discussed with the patient (in the company of ***) who voiced understanding. All questions were answered to their satisfaction.  The patient was counseled on pertinent fall precautions per the printed material provided today, and as noted under the "Patient Instructions" section below.***  When available, results of the above investigations and possible further recommendations will be communicated to the patient via telephone/MyChart. Patient to call office if not contacted after expected testing turnaround time.   Total time spent reviewing records, interview, history/exam, documentation, and coordination of care on day of encounter:  *** min   Thank you for allowing me to participate in patient's care.  If I can answer any additional questions, I would be pleased to do so.  Kai Levins, MD   CC: Janith Lima, MD [place] [id number]  CC: Referring provider: Janith Lima, MD [address] [place],   [id number]

## 2022-05-07 ENCOUNTER — Ambulatory Visit (INDEPENDENT_AMBULATORY_CARE_PROVIDER_SITE_OTHER): Payer: Medicare Other | Admitting: Neurology

## 2022-05-07 ENCOUNTER — Other Ambulatory Visit (INDEPENDENT_AMBULATORY_CARE_PROVIDER_SITE_OTHER): Payer: Medicare Other

## 2022-05-07 ENCOUNTER — Encounter: Payer: Self-pay | Admitting: Neurology

## 2022-05-07 VITALS — BP 122/57 | HR 82 | Ht 60.0 in | Wt 148.0 lb

## 2022-05-07 DIAGNOSIS — R29898 Other symptoms and signs involving the musculoskeletal system: Secondary | ICD-10-CM | POA: Diagnosis not present

## 2022-05-07 DIAGNOSIS — R2681 Unsteadiness on feet: Secondary | ICD-10-CM

## 2022-05-07 DIAGNOSIS — G250 Essential tremor: Secondary | ICD-10-CM | POA: Diagnosis not present

## 2022-05-07 DIAGNOSIS — M4802 Spinal stenosis, cervical region: Secondary | ICD-10-CM | POA: Diagnosis not present

## 2022-05-07 MED ORDER — PRIMIDONE 50 MG PO TABS: 25.0000 mg | ORAL_TABLET | Freq: Every day | ORAL | 1 refills | Status: DC

## 2022-05-07 NOTE — Patient Instructions (Signed)
I saw you today for tremors and leg weakness.  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 I do not see signs of parkinson's disease. We will try a low dose medication called Primidone for your tremors. Let me know if this is not helping as we can go up on the medication.  Your leg weakness and problems walking could be due to a tight spine in your neck. I would like to get an MRI and some labs today to look into this further.  I will be in touch when I have your results.  I would like to see you back in clinic in 3 months.  The physicians and staff at [facility] are committed to providing excellent care. You may receive a survey requesting feedback about your experience at our office. We strive to receive "very good" responses to the survey questions. If you feel that your experience would prevent you from giving the office a "very good " response, please contact our office to try to remedy the situation. We may be reached at [phone]. Thank you for taking the time out of your busy day to complete the survey.  Kai Levins, MD [facility]  Preventing Falls at [facility] are common, often dreaded events in the lives of older people. Aside from the obvious injuries and even death that may result, fall can cause wide-ranging consequences including loss of independence, mental decline, decreased activity and mobility. Younger people are also at risk of falling, especially those with chronic illnesses and fatigue.  Ways to reduce risk for falling Examine diet and medications. Warm foods and alcohol dilate blood vessels, which can lead to dizziness when standing. Sleep aids, antidepressants and pain medications can also increase the likelihood of a fall.  Get a vision exam. Poor vision, cataracts and glaucoma increase the chances of falling.  Check foot gear. Shoes should fit snugly and have a sturdy, nonskid sole and a broad, low heel  Participate in a physician-approved  exercise program to build and maintain muscle strength and improve balance and coordination. Programs that use ankle weights or stretch bands are excellent for muscle-strengthening. Water aerobics programs and low-impact Tai Chi programs have also been shown to improve balance and coordination.  Increase vitamin D intake. Vitamin D improves muscle strength and increases the amount of calcium the body is able to absorb and deposit in bones.  How to prevent falls from common hazards Floors - Remove all loose wires, cords, and throw rugs. Minimize clutter. Make sure rugs are anchored and smooth. Keep furniture in its usual place.  Chairs -- Use chairs with straight backs, armrests and firm seats. Add firm cushions to existing pieces to add height.  Bathroom - Install grab bars and non-skid tape in the tub or shower. Use a bathtub transfer bench or a shower chair with a back support Use an elevated toilet seat and/or safety rails to assist standing from a low surface. Do not use towel racks or bathroom tissue holders to help you stand.  Lighting - Make sure halls, stairways, and entrances are well-lit. Install a night light in your bathroom or hallway. Make sure there is a light switch at the top and bottom of the staircase. Turn lights on if you get up in the middle of the night. Make sure lamps or light switches are within reach of the bed if you have to get up during the night.  Kitchen - Install non-skid rubber mats near the sink and stove. Clean  spills immediately. Store frequently used utensils, pots, pans between waist and eye level. This helps prevent reaching and bending. Sit when getting things out of lower cupboards.  Living room/ Bedrooms - Place furniture with wide spaces in between, giving enough room to move around. Establish a route through the living room that gives you something to hold onto as you walk.  Stairs - Make sure treads, rails, and rugs are secure. Install a rail on both  sides of the stairs. If stairs are a threat, it might be helpful to arrange most of your activities on the lower level to reduce the number of times you must climb the stairs.  Entrances and doorways - Install metal handles on the walls adjacent to the doorknobs of all doors to make it more secure as you travel through the doorway.  Tips for maintaining balance Keep at least one hand free at all times. Try using a backpack or fanny pack to hold things rather than carrying them in your hands. Never carry objects in both hands when walking as this interferes with keeping your balance.  Attempt to swing both arms from front to back while walking. This might require a conscious effort if Parkinson's disease has diminished your movement. It will, however, help you to maintain balance and posture, and reduce fatigue.  Consciously lift your feet off of the ground when walking. Shuffling and dragging of the feet is a common culprit in losing your balance.  When trying to navigate turns, use a "U" technique of facing forward and making a wide turn, rather than pivoting sharply.  Try to stand with your feet shoulder-length apart. When your feet are close together for any length of time, you increase your risk of losing your balance and falling.  Do one thing at a time. Don't try to walk and accomplish another task, such as reading or looking around. The decrease in your automatic reflexes complicates motor function, so the less distraction, the better.  Do not wear rubber or gripping soled shoes, they might "catch" on the floor and cause tripping.  Move slowly when changing positions. Use deliberate, concentrated movements and, if needed, use a grab bar or walking aid. Count 15 seconds between each movement. For example, when rising from a seated position, wait 15 seconds after standing to begin walking.  If balance is a continuous problem, you might want to consider a walking aid such as a cane, walking  stick, or walker. Once you've mastered walking with help, you might be ready to try it on your own again.

## 2022-05-09 ENCOUNTER — Telehealth: Payer: Self-pay | Admitting: Neurology

## 2022-05-09 ENCOUNTER — Telehealth: Payer: Self-pay | Admitting: Internal Medicine

## 2022-05-09 LAB — VITAMIN E
Gamma-Tocopherol (Vit E): 3.3 mg/L (ref <=4.3)
Vitamin E (Alpha Tocopherol): 11.7 mg/L (ref 5.7–19.9)

## 2022-05-09 LAB — COPPER, SERUM: Copper: 129 ug/dL (ref 70–175)

## 2022-05-09 NOTE — Telephone Encounter (Signed)
Caller Name: Evanee Lubrano Call back phone #: [phone]  MEDICATION(S):  ALPRAZolam Duanne Moron) 0.25 MG tablet  Days of Med Remaining: 2  Has the patient contacted their pharmacy (YES/NO)? yes What did pharmacy advise? Told to call  Preferred Pharmacy:  [facility] on [address]  ~~~Please advise patient/caregiver to allow 2-3 business days to process RX refills.

## 2022-05-09 NOTE — Telephone Encounter (Signed)
Called and let message

## 2022-05-09 NOTE — Telephone Encounter (Signed)
Patient left Voicemail stating that she thought Dr Berdine Addison was ordering a MRI on the neck but she got a call from a PT office about getting PT on her Neck she is not sure what she is to have done at this point. Please call her and let her know if she is to have a MRI or PT

## 2022-05-11 ENCOUNTER — Other Ambulatory Visit: Payer: Self-pay | Admitting: Internal Medicine

## 2022-05-11 DIAGNOSIS — F411 Generalized anxiety disorder: Secondary | ICD-10-CM

## 2022-05-11 MED ORDER — ALPRAZOLAM 0.25 MG PO TABS: 0.2500 mg | ORAL_TABLET | Freq: Two times a day (BID) | ORAL | 4 refills | Status: DC | PRN

## 2022-05-21 ENCOUNTER — Ambulatory Visit (INDEPENDENT_AMBULATORY_CARE_PROVIDER_SITE_OTHER): Payer: Medicare Other

## 2022-05-21 ENCOUNTER — Other Ambulatory Visit: Payer: Self-pay | Admitting: Internal Medicine

## 2022-05-21 DIAGNOSIS — M810 Age-related osteoporosis without current pathological fracture: Secondary | ICD-10-CM

## 2022-05-21 DIAGNOSIS — E538 Deficiency of other specified B group vitamins: Secondary | ICD-10-CM

## 2022-05-21 MED ORDER — CYANOCOBALAMIN 1000 MCG/ML IJ SOLN
1000.0000 ug | Freq: Once | INTRAMUSCULAR | Status: AC
  Administered 2022-05-21: 1000 ug via INTRAMUSCULAR

## 2022-05-21 NOTE — Progress Notes (Signed)
After obtaining consent, and per orders of Dr. Jones, injection of B12 was given in the left deltoid by Amon Costilla P Nellie Pester. Patient instructed to report any adverse reaction to me immediately.  

## 2022-05-22 ENCOUNTER — Telehealth: Payer: Self-pay

## 2022-05-22 ENCOUNTER — Telehealth: Payer: Self-pay | Admitting: Neurology

## 2022-05-22 NOTE — Telephone Encounter (Signed)
Called and pt had already called PT and Canceled. They could not tell her what she will be paying out and she is on a limited budget. She is having MRI done and after that she will call us back and let us know what she wants to do about Pt

## 2022-05-22 NOTE — Telephone Encounter (Signed)
The following message was left with [facility] on 05/22/22 at 12:53 PM.  Caller states they saw Dr. Berdine Addison and was prescribed tramadol. She wants him to know there has been improvement and it seems to be working. She's not  100% yet but she would put herself at 85 %. She's also having an MRI done on Saturday.   Lastly, she has an appointment with PT on Monday but she's going to cancel until the MRI. She would like the number for [facility] to cancel her appointment please.

## 2022-05-24 ENCOUNTER — Ambulatory Visit
Admission: RE | Admit: 2022-05-24 | Discharge: 2022-05-24 | Disposition: A | Discharge status: Home / Self Care | Payer: Medicare Other | Source: Ambulatory Visit | Attending: Neurology | Admitting: Neurology

## 2022-05-24 DIAGNOSIS — M4802 Spinal stenosis, cervical region: Secondary | ICD-10-CM

## 2022-05-24 DIAGNOSIS — R2681 Unsteadiness on feet: Secondary | ICD-10-CM

## 2022-05-24 DIAGNOSIS — G250 Essential tremor: Secondary | ICD-10-CM

## 2022-05-24 DIAGNOSIS — R29898 Other symptoms and signs involving the musculoskeletal system: Secondary | ICD-10-CM

## 2022-05-26 ENCOUNTER — Telehealth: Payer: Self-pay | Admitting: Neurology

## 2022-05-26 NOTE — Telephone Encounter (Signed)
Patient called and left a voice mail requesting a call back from [facility] about PT for her legs.

## 2022-05-28 NOTE — Telephone Encounter (Signed)
Returning [employer]

## 2022-05-28 NOTE — Telephone Encounter (Signed)
Called Pt and she want to wait on PT for now. She has other things going on at this time. 2 other doctors to see and on a budget. I reminded her of her up coming appointment in January with Dr. Berdine Addison and she can let us know what she would like to do at that time.

## 2022-05-28 NOTE — Telephone Encounter (Signed)
Called and left message to call office

## 2022-06-04 ENCOUNTER — Ambulatory Visit (INDEPENDENT_AMBULATORY_CARE_PROVIDER_SITE_OTHER): Payer: Medicare Other | Admitting: Internal Medicine

## 2022-06-04 ENCOUNTER — Encounter: Payer: Self-pay | Admitting: Internal Medicine

## 2022-06-04 VITALS — BP 142/66 | HR 95 | Temp 98.5°F | Ht 60.0 in | Wt 149.0 lb

## 2022-06-04 DIAGNOSIS — D75839 Thrombocytosis, unspecified: Secondary | ICD-10-CM

## 2022-06-04 DIAGNOSIS — Z23 Encounter for immunization: Secondary | ICD-10-CM

## 2022-06-04 DIAGNOSIS — D508 Other iron deficiency anemias: Secondary | ICD-10-CM | POA: Diagnosis not present

## 2022-06-04 DIAGNOSIS — J22 Unspecified acute lower respiratory infection: Secondary | ICD-10-CM | POA: Diagnosis not present

## 2022-06-04 DIAGNOSIS — D510 Vitamin B12 deficiency anemia due to intrinsic factor deficiency: Secondary | ICD-10-CM

## 2022-06-04 DIAGNOSIS — J449 Chronic obstructive pulmonary disease, unspecified: Secondary | ICD-10-CM | POA: Diagnosis not present

## 2022-06-04 DIAGNOSIS — D538 Other specified nutritional anemias: Secondary | ICD-10-CM

## 2022-06-04 DIAGNOSIS — I10 Essential (primary) hypertension: Secondary | ICD-10-CM | POA: Diagnosis not present

## 2022-06-04 DIAGNOSIS — D520 Dietary folate deficiency anemia: Secondary | ICD-10-CM

## 2022-06-04 DIAGNOSIS — N1832 Chronic kidney disease, stage 3b: Secondary | ICD-10-CM

## 2022-06-04 DIAGNOSIS — I739 Peripheral vascular disease, unspecified: Secondary | ICD-10-CM | POA: Diagnosis not present

## 2022-06-04 LAB — CBC WITH DIFFERENTIAL/PLATELET
Basophils Absolute: 0.1 10*3/uL (ref 0.0–0.1)
Basophils Relative: 1 % (ref 0.0–3.0)
Eosinophils Absolute: 0.4 10*3/uL (ref 0.0–0.7)
Eosinophils Relative: 2.4 % (ref 0.0–5.0)
HCT: 34.9 % — ABNORMAL LOW (ref 36.0–46.0)
Hemoglobin: 10.9 g/dL — ABNORMAL LOW (ref 12.0–15.0)
Lymphocytes Relative: 9.5 % — ABNORMAL LOW (ref 12.0–46.0)
Lymphs Abs: 1.4 10*3/uL (ref 0.7–4.0)
MCHC: 31.1 g/dL (ref 30.0–36.0)
MCV: 76 fl — ABNORMAL LOW (ref 78.0–100.0)
Monocytes Absolute: 1.6 10*3/uL — ABNORMAL HIGH (ref 0.1–1.0)
Monocytes Relative: 10.9 % (ref 3.0–12.0)
Neutro Abs: 11.2 10*3/uL — ABNORMAL HIGH (ref 1.4–7.7)
Neutrophils Relative %: 76.2 % (ref 43.0–77.0)
Platelets: 637 10*3/uL — ABNORMAL HIGH (ref 150.0–400.0)
RBC: 4.59 Mil/uL (ref 3.87–5.11)
RDW: 18.6 % — ABNORMAL HIGH (ref 11.5–15.5)
WBC: 14.7 10*3/uL — ABNORMAL HIGH (ref 4.0–10.5)

## 2022-06-04 LAB — IBC + FERRITIN
Ferritin: 115.2 ng/mL (ref 10.0–291.0)
Iron: 24 ug/dL — ABNORMAL LOW (ref 42–145)
Saturation Ratios: 7.1 % — ABNORMAL LOW (ref 20.0–50.0)
TIBC: 336 ug/dL (ref 250.0–450.0)
Transferrin: 240 mg/dL (ref 212.0–360.0)

## 2022-06-04 MED ORDER — CEFDINIR 300 MG PO CAPS: 300.0000 mg | ORAL_CAPSULE | Freq: Two times a day (BID) | ORAL | 0 refills | Status: AC

## 2022-06-04 NOTE — Patient Instructions (Signed)

## 2022-06-04 NOTE — Progress Notes (Signed)
Subjective:  Patient ID: Courtney Brown, female    DOB: Jul 09, 1941  Age: 81 y.o. MRN: [id number]  CC: Sinusitis, URI, and Anemia   HPI Courtney Brown presents for f/up -  She complains of a 1 week history of facial pain, thick yellow-green mucus, cough productive of brown phlegm and chronic unchanged shortness of breath.  Outpatient Medications Prior to Visit  Medication Sig Dispense Refill   acetaminophen (TYLENOL) 500 MG tablet Take 500 mg by mouth every 6 (six) hours as needed for mild pain or moderate pain.     albuterol (VENTOLIN HFA) 108 (90 Base) MCG/ACT inhaler INHALE 1-2 PUFFS BY MOUTH EVERY 6 HOURS AS NEEDED FOR WHEEZE OR SHORTNESS OF BREATH 6.7 each 1   ALPRAZolam (XANAX) 0.25 MG tablet Take 1 tablet (0.25 mg total) by mouth 2 (two) times daily as needed for anxiety. 30 tablet 4   aspirin 81 MG tablet Take 81 mg by mouth at bedtime.     Calcium Citrate-Vitamin D (CITRACAL + D PO) Take 1 tablet by mouth 2 (two) times daily. 600 mg calcium     colesevelam (WELCHOL) 625 MG tablet TAKE 1 TABLET (625 MG TOTAL) BY MOUTH 2 (TWO) TIMES DAILY WITH A MEAL. 180 tablet 1   Cyanocobalamin (B-12 COMPLIANCE INJECTION IJ) Inject as directed. One time a month     fenofibrate (TRICOR) 145 MG tablet TAKE 1 TABLET BY MOUTH EVERY DAY 90 tablet 1   Ferric Maltol (ACCRUFER) 30 MG CAPS Take 1 capsule by mouth in the morning and at bedtime. 347 capsule 0   folic acid (FOLVITE) 1 MG tablet Take 1 tablet (1 mg total) by mouth daily. 90 tablet 1   gabapentin (NEURONTIN) 100 MG capsule Take 1 capsule (100 mg total) by mouth 2 (two) times daily. 425 capsule 1   Garlic Oil 9563 MG CAPS Take 1,000 mg by mouth daily. (Patient not taking: Reported on 05/07/2022)     Lactobacillus (PROBIOTIC ACIDOPHILUS) CAPS Take 1 tablet by mouth daily.     loratadine (CLARITIN) 10 MG tablet TAKE 1 TABLET BY MOUTH EVERY DAY 90 tablet 1   magnesium oxide (MAG-OX) 400 (240 Mg) MG tablet TAKE 1 TABLET BY MOUTH TWICE A DAY 60  tablet 2   metoprolol succinate (TOPROL-XL) 50 MG 24 hr tablet Take 1 tablet (50 mg total) by mouth daily. Take with or immediately following a meal. 90 tablet 1   mometasone (NASONEX) 50 MCG/ACT nasal spray PLACE 2 SPRAYS INTO THE NOSE DAILY. 51 each 3   omeprazole (PRILOSEC) 20 MG capsule TAKE 1 CAPSULE BY MOUTH EVERY DAY 90 capsule 1   primidone (MYSOLINE) 50 MG tablet Take 0.5 tablets (25 mg total) by mouth at bedtime. 45 tablet 1   raloxifene (EVISTA) 60 MG tablet TAKE 1 TABLET BY MOUTH EVERY DAY 90 tablet 1   SPIRIVA RESPIMAT 2.5 MCG/ACT AERS INHALE 2 PUFFS BY MOUTH PER DAY 12 g 1   triamcinolone cream (KENALOG) 0.1 % Apply 1 application topically 2 (two) times daily. (Patient taking differently: Apply 1 application  topically daily as needed (yeast infection under breast).) 30 g 0   zinc gluconate 50 MG tablet Take 1 tablet (50 mg total) by mouth daily. 90 tablet 1   budesonide-formoterol (SYMBICORT) 160-4.5 MCG/ACT inhaler TAKE 2 PUFFS BY MOUTH TWICE A DAY 30.6 each 1   furosemide (LASIX) 20 MG tablet TAKE 1 TABLET BY MOUTH DAILY FOR LEG EDEMA 90 tablet 1   Facility-Administered Medications Prior to Visit  Medication Dose Route Frequency Provider Last Rate Last Admin   cyanocobalamin (VITAMIN B12) injection 1,000 mcg  1,000 mcg Intramuscular Q30 days Janith Lima, MD   1,000 mcg at 03/24/22 1517    ROS Review of Systems  Constitutional:  Negative for chills, diaphoresis, fatigue and fever.  HENT:  Positive for postnasal drip, rhinorrhea, sinus pressure and sinus pain. Negative for sneezing.   Eyes: Negative.   Respiratory:  Positive for cough and shortness of breath. Negative for chest tightness and wheezing.   Cardiovascular:  Negative for chest pain, palpitations and leg swelling.  Gastrointestinal:  Negative for abdominal pain and diarrhea.  Endocrine: Negative.   Genitourinary: Negative.  Negative for difficulty urinating.  Musculoskeletal: Negative.   Skin: Negative.    Neurological: Negative.  Negative for dizziness.  Hematological:  Negative for adenopathy. Does not bruise/bleed easily.  Psychiatric/Behavioral: Negative.      Objective:  BP (!) 142/66 (BP Location: Left Arm, Patient Position: Sitting, Cuff Size: Large)   Pulse 95   Temp 98.5 F (36.9 C) (Oral)   Ht 5' (1.524 m)   Wt 149 lb (67.6 kg)   SpO2 99%   BMI 29.10 kg/m   BP Readings from Last 3 Encounters:  06/04/22 (!) 142/66  05/07/22 (!) 122/57  02/26/22 136/66    Wt Readings from Last 3 Encounters:  06/04/22 149 lb (67.6 kg)  05/07/22 148 lb (67.1 kg)  02/26/22 153 lb (69.4 kg)    Physical Exam Vitals reviewed.  Constitutional:      General: She is not in acute distress.    Appearance: She is ill-appearing. She is not toxic-appearing or diaphoretic.  HENT:     Mouth/Throat:     Mouth: Mucous membranes are moist.  Eyes:     General: No scleral icterus.    Conjunctiva/sclera: Conjunctivae normal.  Cardiovascular:     Rate and Rhythm: Normal rate and regular rhythm.     Heart sounds: No murmur heard. Pulmonary:     Effort: Pulmonary effort is normal.     Breath sounds: No stridor. No wheezing, rhonchi or rales.  Abdominal:     General: Abdomen is flat.     Tenderness: There is no abdominal tenderness. There is no guarding.     Hernia: No hernia is present.  Musculoskeletal:        General: Normal range of motion.     Cervical back: Neck supple.     Right lower leg: No edema.     Left lower leg: No edema.  Lymphadenopathy:     Cervical: No cervical adenopathy.  Skin:    General: Skin is warm and dry.  Neurological:     General: No focal deficit present.     Mental Status: She is alert.     Lab Results  Component Value Date   WBC 14.7 (H) 06/04/2022   HGB 10.9 (L) 06/04/2022   HCT 34.9 (L) 06/04/2022   PLT 637.0 (H) 06/04/2022   GLUCOSE 116 (H) 02/21/2022   CHOL 145 08/21/2021   TRIG 207.0 (H) 08/21/2021   HDL 41.70 08/21/2021   LDLDIRECT 81.0  08/21/2021   LDLCALC 74 07/10/2020   ALT 10 08/21/2021   AST 18 08/21/2021   NA 134 (L) 02/21/2022   K 3.7 02/21/2022   CL 97 02/21/2022   CREATININE 0.86 02/21/2022   BUN 12 02/21/2022   CO2 29 02/21/2022   TSH 2.60 02/21/2022   HGBA1C 5.7 08/21/2021    MR CERVICAL SPINE  WO CONTRAST  Result Date: 05/28/2022 CLINICAL DATA:  Essential tremor. Cervical stenosis with gait instability EXAM: MRI CERVICAL SPINE WITHOUT CONTRAST TECHNIQUE: Multiplanar, multisequence MR imaging of the cervical spine was performed. No intravenous contrast was administered. COMPARISON:  Cervical spine CT 08/31/2019 FINDINGS: Alignment: Straightening of cervical lordosis. Vertebrae: No fracture, evidence of discitis, or bone lesion. Cord: Normal signal and morphology. Posterior Fossa, vertebral arteries, paraspinal tissues: Trace dependent pleural fluid on the right. No flow void seen within the left vertebral artery, the non dominant side. Disc levels: C2-3: Mild facet spurring with ligamentum flavum thickening. No neural compression C3-4: Mild facet spurring with tiny central protrusion C4-5: Degenerative facet spurring and mild disc space narrowing C5-6: Disc narrowing and bulging with endplate ridging. Mild facet spurring. C6-7: Disc space narrowing and bulging with mild endplate ridging. Patent canal and foramina C7-T1:Unremarkable. IMPRESSION: 1. Ordinary cervical spine degeneration as described. Diffusely patent canal and foramina. Normal cord signal. 2. Slow or absent flow in the non dominant left vertebral artery. Electronically Signed   By: Jorje Guild M.D.   On: 05/28/2022 08:54    Assessment & Plan:   Carey was seen today for sinusitis, uri and anemia.  Diagnoses and all orders for this visit:  Flu vaccine need -     Flu Vaccine QUAD High Dose(Fluad)  Thrombocytosis -     IBC + Ferritin; Future -     CBC with Differential/Platelet; Future -     CBC with Differential/Platelet -     IBC +  Ferritin  Stage 3b chronic kidney disease (HCC)  Iron deficiency anemia secondary to inadequate dietary iron intake-she has not responded well to an oral iron supplement.  I recommended that she be evaluated for iron infusion. -     IBC + Ferritin; Future -     CBC with Differential/Platelet; Future -     CBC with Differential/Platelet -     IBC + Ferritin -     Ambulatory referral to Hematology / Oncology  Vitamin B12 deficiency anemia due to intrinsic factor deficiency  Dietary folate deficiency anemia  Anemia due to zinc deficiency  LRTI (lower respiratory tract infection)-we will treat with a broad-spectrum cephalosporin. -     cefdinir (OMNICEF) 300 MG capsule; Take 1 capsule (300 mg total) by mouth 2 (two) times daily for 10 days.  PVD (peripheral vascular disease) (HCC)  HTN (hypertension), benign -     furosemide (LASIX) 20 MG tablet; Take 1 tablet (20 mg total) by mouth daily.  Chronic obstructive pulmonary disease, unspecified COPD type (HCC) -     budesonide-formoterol (SYMBICORT) 160-4.5 MCG/ACT inhaler; TAKE 2 PUFFS BY MOUTH TWICE A DAY   I have changed Honora Steffensmeier's furosemide. I am also having her start on cefdinir. Additionally, I am having her maintain her aspirin, Probiotic Acidophilus, acetaminophen, Garlic Oil, triamcinolone cream, Calcium Citrate-Vitamin D (CITRACAL + D PO), albuterol, gabapentin, magnesium oxide, mometasone, fenofibrate, omeprazole, colesevelam, metoprolol succinate, ACCRUFeR, folic acid, zinc gluconate, Spiriva Respimat, loratadine, Cyanocobalamin (B-12 COMPLIANCE INJECTION IJ), primidone, ALPRAZolam, raloxifene, and budesonide-formoterol. We will continue to administer cyanocobalamin.  Meds ordered this encounter  Medications   cefdinir (OMNICEF) 300 MG capsule    Sig: Take 1 capsule (300 mg total) by mouth 2 (two) times daily for 10 days.    Dispense:  20 capsule    Refill:  0   furosemide (LASIX) 20 MG tablet    Sig: Take 1 tablet (20  mg total) by mouth daily.  Dispense:  90 tablet    Refill:  1   budesonide-formoterol (SYMBICORT) 160-4.5 MCG/ACT inhaler    Sig: TAKE 2 PUFFS BY MOUTH TWICE A DAY    Dispense:  30.6 each    Refill:  1     Follow-up: Return in about 3 months (around 09/04/2022).  Scarlette Calico, MD

## 2022-06-06 ENCOUNTER — Other Ambulatory Visit: Payer: Self-pay | Admitting: Internal Medicine

## 2022-06-06 DIAGNOSIS — J449 Chronic obstructive pulmonary disease, unspecified: Secondary | ICD-10-CM

## 2022-06-06 DIAGNOSIS — I739 Peripheral vascular disease, unspecified: Secondary | ICD-10-CM

## 2022-06-06 MED ORDER — BUDESONIDE-FORMOTEROL FUMARATE 160-4.5 MCG/ACT IN AERO: INHALATION_SPRAY | RESPIRATORY_TRACT | 1 refills | Status: DC

## 2022-06-06 MED ORDER — FUROSEMIDE 20 MG PO TABS: 20.0000 mg | ORAL_TABLET | Freq: Every day | ORAL | 1 refills | Status: DC

## 2022-06-12 ENCOUNTER — Telehealth: Payer: Self-pay | Admitting: Oncology

## 2022-06-12 NOTE — Telephone Encounter (Signed)
Spoke with patient to confirm upcoming ne patient appointments 12/8

## 2022-07-02 ENCOUNTER — Encounter: Payer: Self-pay | Admitting: Internal Medicine

## 2022-07-02 NOTE — Progress Notes (Signed)
[facility] Quality Other; (KED MEASURE) CHART REVIEWED FOR KED (KIDNEY HEALTH GAP- URINE MICROALBUMIN/ CREATININE RATION NEEDED TO CLOSE GAP. PLEASE ADDRESS BEFORE 07/27/2022 IF POSSIBLE. THANKS!

## 2022-07-03 NOTE — Progress Notes (Signed)
[facility] Phone: [phone] Subjective:    I'm seeing this patient by the request  of:  Janith Lima, MD  CC: Bilateral leg weakness right greater than left  [id number]  Courtney Brown is a 81 y.o. female coming in with complaint of B leg weakness. 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 States she has noticed now that the knee will also get this warm sensation and almost feels like that knee is going to buckle. Patient states that she was referred by Suanne Marker sherrill to see what she can do and if an injection would be helpful. Patient states that she uses a walker when having to walk far.          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    Stroke [facility])    TIA   Past Surgical History:  Procedure Laterality Date   [place]  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   HERNIA REPAIR     umbilical, ventral   INCISIONAL HERNIA REPAIR N/A 10/26/2020   Procedure: DIAGNOSTIC LAPAROSCOPY MESH EXPLANTATION; SMALL BOWEL RESECTION; PRIMARY REPAIR OF INCISIONAL HERNIA;  Surgeon: Kieth Brightly, [name], MD;  Location: [facility];  Service: General;  Laterality: N/A;   INSERTION OF ILIAC STENT  2008   Social History   Socioeconomic History   Marital status: Widowed    Spouse name: Not on file   Number of children: Not on file   Years of education: Not on file   Highest education level: Not on file  Occupational History   Not on file  Tobacco Use   Smoking status: Former    Packs/day: 1.50    Years: 44.00    Total pack years: 66.00    Types: Cigarettes    Quit date: 07/28/2005    Years since quitting: 16.9   Smokeless tobacco: Never  Vaping Use   Vaping Use: Never used  Substance and Sexual Activity   Alcohol use: Yes    Alcohol/week: 2.0 standard drinks of alcohol    Types: 2 Shots of liquor per week    Comment: rarely   Drug use: No   Sexual activity: Not Currently  Other Topics Concern   Not on file  Social History Narrative   Right handed   Caffeine 64 0z a day   Home is one  story    Social Determinants of Health   Financial Resource Strain: Not on file  Food Insecurity: Not on file  Transportation Needs: Not on file  Physical Activity: Not on file  Stress: Not on file  Social Connections: Not on file   Allergies  Allergen Reactions   Prednisone Other (See Comments)    Leg pain,light headed   Statins     Myalgias- per patient, she feels better off the medication   Family History  Problem Relation Age of Onset   Hearing loss Father    Hearing loss Maternal Aunt    Hearing loss Maternal Grandmother    Cancer Maternal Grandmother        colon   Arthritis Mother    Cancer Paternal Grandmother        colon   Breast cancer Neg Hx     Current Outpatient Medications (Endocrine & Metabolic):    raloxifene (EVISTA) 60 MG tablet, TAKE 1 TABLET BY MOUTH EVERY DAY   Current Outpatient Medications (Cardiovascular):    colesevelam (WELCHOL) 625 MG tablet, TAKE 1 TABLET (625 MG TOTAL) BY MOUTH 2 (TWO) TIMES DAILY WITH A MEAL.   fenofibrate (TRICOR) 145 MG  tablet, TAKE 1 TABLET BY MOUTH EVERY DAY   furosemide (LASIX) 20 MG tablet, Take 1 tablet (20 mg total) by mouth daily.   metoprolol succinate (TOPROL-XL) 50 MG 24 hr tablet, Take 1 tablet (50 mg total) by mouth daily. Take with or immediately following a meal.   Current Outpatient Medications (Respiratory):    albuterol (VENTOLIN HFA) 108 (90 Base) MCG/ACT inhaler, INHALE 1-2 PUFFS BY MOUTH EVERY 6 HOURS AS NEEDED FOR WHEEZE OR SHORTNESS OF BREATH   budesonide-formoterol (SYMBICORT) 160-4.5 MCG/ACT inhaler, TAKE 2 PUFFS BY MOUTH TWICE A DAY   loratadine (CLARITIN) 10 MG tablet, TAKE 1 TABLET BY MOUTH EVERY DAY   mometasone (NASONEX) 50 MCG/ACT nasal spray, PLACE 2 SPRAYS INTO THE NOSE DAILY.   SPIRIVA RESPIMAT 2.5 MCG/ACT AERS, INHALE 2 PUFFS BY MOUTH PER DAY   Current Outpatient Medications (Analgesics):    acetaminophen (TYLENOL) 500 MG tablet, Take 500 mg by mouth every 6 (six) hours as needed for mild pain or moderate pain.   aspirin 81 MG tablet, Take 81 mg by mouth at bedtime.   Current Outpatient Medications (Hematological):    Cyanocobalamin (B-12 COMPLIANCE INJECTION IJ), Inject as directed. One time a month (Patient not taking: Reported on 07/04/2022)   Ferric Maltol (ACCRUFER) 30 MG CAPS, Take 1 capsule by mouth in the morning and at bedtime. (Patient not taking: Reported on 93/02/1016)   folic acid (FOLVITE) 1 MG tablet, Take 1 tablet (1 mg total) by mouth daily.  Current Facility-Administered Medications (Hematological):    cyanocobalamin (VITAMIN B12) injection 1,000 mcg  Current Outpatient Medications (Other):    ALPRAZolam (XANAX) 0.25 MG tablet, Take 1 tablet (0.25 mg total) by mouth 2 (two) times daily as needed for anxiety.   Calcium Citrate-Vitamin D (CITRACAL + D PO), Take 1 tablet by mouth 2 (two) times daily. 600 mg calcium   gabapentin (NEURONTIN) 100 MG capsule, Take 1 capsule (100 mg total) by mouth 2 (two) times daily.   Garlic Oil 5102 MG CAPS, Take 1,000 mg  by mouth daily. (Patient not taking: Reported on 05/07/2022)   Lactobacillus (PROBIOTIC ACIDOPHILUS) CAPS, Take 1 tablet by mouth daily.   magnesium oxide (MAG-OX) 400 (240 Mg) MG tablet, TAKE 1 TABLET BY MOUTH TWICE A DAY   omeprazole (PRILOSEC) 20 MG  capsule, TAKE 1 CAPSULE BY MOUTH EVERY DAY   primidone (MYSOLINE) 50 MG tablet, Take 0.5 tablets (25 mg total) by mouth at bedtime.   triamcinolone cream (KENALOG) 0.1 %, Apply 1 application topically 2 (two) times daily. (Patient taking differently: Apply 1 application  topically daily as needed (yeast infection under breast).)   zinc gluconate 50 MG tablet, Take 1 tablet (50 mg total) by mouth daily.    Reviewed prior external information including notes and imaging from  primary care provider As well as notes that were available from care everywhere and other healthcare systems.  Past medical history, social, surgical and family history all reviewed in electronic medical record.  No pertanent information unless stated regarding to the chief complaint.   Review of Systems:  No headache, visual changes, nausea, vomiting, diarrhea, constipation, dizziness, abdominal pain, skin rash, fevers, chills, night sweats, weight loss, swollen lymph nodes,  chest pain, shortness of breath, mood changes. POSITIVE muscle aches, body aches, joint swelling  Objective  Blood pressure 100/60, pulse (!) 103, height 5' (1.524 m), weight 148 lb (67.1 kg), SpO2 95 %.   General: No apparent distress alert and oriented x3 mood and affect normal, dressed appropriately.  HEENT: Pupils equal, extraocular movements intact  Respiratory: Patient's speak in full sentences and does not appear short of breath  Cardiovascular: Trace lower extremity edema, non tender, no erythema  Patient does ambulate with the aid of a walker. Patient's right knee does have some crepitus noted.  He does have some lateral tracking noted on the patella noted. Does have some atrophy noted as  well of the lower extremities bilaterally.  Negative straight leg test.  Worsening pain in the back to palpation and extension.  After informed written and verbal consent, patient was seated on exam table. Right knee was prepped with alcohol swab and utilizing anterolateral approach, patient's right knee space was injected with 4:1  marcaine 0.5%: Kenalog '40mg'$ /dL. Patient tolerated the procedure well without immediate complications.   Impression and Recommendations:    The above documentation has been reviewed and is accurate and complete Lyndal Pulley, DO

## 2022-07-04 ENCOUNTER — Inpatient Hospital Stay: Payer: Medicare Other | Attending: Oncology | Admitting: Oncology

## 2022-07-04 ENCOUNTER — Inpatient Hospital Stay: Payer: Medicare Other

## 2022-07-04 VITALS — BP 143/55 | HR 103 | Temp 97.8°F | Resp 16 | Wt 149.0 lb

## 2022-07-04 DIAGNOSIS — D649 Anemia, unspecified: Secondary | ICD-10-CM | POA: Diagnosis not present

## 2022-07-04 DIAGNOSIS — D538 Other specified nutritional anemias: Secondary | ICD-10-CM | POA: Diagnosis not present

## 2022-07-04 DIAGNOSIS — D510 Vitamin B12 deficiency anemia due to intrinsic factor deficiency: Secondary | ICD-10-CM

## 2022-07-04 DIAGNOSIS — D539 Nutritional anemia, unspecified: Secondary | ICD-10-CM | POA: Diagnosis not present

## 2022-07-04 DIAGNOSIS — Z79899 Other long term (current) drug therapy: Secondary | ICD-10-CM | POA: Insufficient documentation

## 2022-07-04 DIAGNOSIS — D520 Dietary folate deficiency anemia: Secondary | ICD-10-CM

## 2022-07-04 DIAGNOSIS — N1832 Chronic kidney disease, stage 3b: Secondary | ICD-10-CM

## 2022-07-04 LAB — CBC WITH DIFFERENTIAL (CANCER CENTER ONLY)
Abs Immature Granulocytes: 0.09 10*3/uL — ABNORMAL HIGH (ref 0.00–0.07)
Basophils Absolute: 0 10*3/uL (ref 0.0–0.1)
Basophils Relative: 0 %
Eosinophils Absolute: 0.4 10*3/uL (ref 0.0–0.5)
Eosinophils Relative: 2 %
HCT: 35 % — ABNORMAL LOW (ref 36.0–46.0)
Hemoglobin: 10.7 g/dL — ABNORMAL LOW (ref 12.0–15.0)
Immature Granulocytes: 1 %
Lymphocytes Relative: 8 %
Lymphs Abs: 1.3 10*3/uL (ref 0.7–4.0)
MCH: 24 pg — ABNORMAL LOW (ref 26.0–34.0)
MCHC: 30.6 g/dL (ref 30.0–36.0)
MCV: 78.7 fL — ABNORMAL LOW (ref 80.0–100.0)
Monocytes Absolute: 1.5 10*3/uL — ABNORMAL HIGH (ref 0.1–1.0)
Monocytes Relative: 10 %
Neutro Abs: 11.8 10*3/uL — ABNORMAL HIGH (ref 1.7–7.7)
Neutrophils Relative %: 79 %
Platelet Count: 650 10*3/uL — ABNORMAL HIGH (ref 150–400)
RBC: 4.45 MIL/uL (ref 3.87–5.11)
RDW: 17.5 % — ABNORMAL HIGH (ref 11.5–15.5)
Smear Review: NORMAL
WBC Count: 15.1 10*3/uL — ABNORMAL HIGH (ref 4.0–10.5)
nRBC: 0 % (ref 0.0–0.2)

## 2022-07-04 LAB — RETICULOCYTES
Immature Retic Fract: 21.9 % — ABNORMAL HIGH (ref 2.3–15.9)
RBC.: 4.45 MIL/uL (ref 3.87–5.11)
Retic Count, Absolute: 73.9 10*3/uL (ref 19.0–186.0)
Retic Ct Pct: 1.7 % (ref 0.4–3.1)

## 2022-07-04 LAB — FERRITIN: Ferritin: 150 ng/mL (ref 11–307)

## 2022-07-04 LAB — CMP (CANCER CENTER ONLY)
ALT: 5 U/L (ref 0–44)
AST: 13 U/L — ABNORMAL LOW (ref 15–41)
Albumin: 2.9 g/dL — ABNORMAL LOW (ref 3.5–5.0)
Alkaline Phosphatase: 47 U/L (ref 38–126)
Anion gap: 5 (ref 5–15)
BUN: 13 mg/dL (ref 8–23)
CO2: 31 mmol/L (ref 22–32)
Calcium: 9.7 mg/dL (ref 8.9–10.3)
Chloride: 104 mmol/L (ref 98–111)
Creatinine: 0.69 mg/dL (ref 0.44–1.00)
GFR, Estimated: >60 mL/min (ref >60)
Glucose, Bld: 91 mg/dL (ref 70–99)
Potassium: 3.8 mmol/L (ref 3.5–5.1)
Sodium: 140 mmol/L (ref 135–145)
Total Bilirubin: 0.3 mg/dL (ref 0.3–1.2)
Total Protein: 6.4 g/dL — ABNORMAL LOW (ref 6.5–8.1)

## 2022-07-04 LAB — DIRECT ANTIGLOBULIN TEST (NOT AT ARMC)
DAT, IgG: NEGATIVE
DAT, complement: NEGATIVE

## 2022-07-04 LAB — VITAMIN B12: Vitamin B-12: 813 pg/mL (ref 180–914)

## 2022-07-04 LAB — FOLATE: Folate: >40 ng/mL (ref >5.9)

## 2022-07-04 NOTE — Progress Notes (Signed)
[facility] Initial Visit:  Patient Care Team: Janith Lima, MD as PCP - General (Internal Medicine) Lorretta Harp, MD as Consulting Physician (Cardiology)  CHIEF COMPLAINTS/PURPOSE OF CONSULTATION:  Oncology History   No history exists.    HISTORY OF PRESENTING ILLNESS: Courtney Brown 81 y.o. female is here because of anemia Medical history notable for arthritis, history of melanoma, colon polyps, COPD, coronary artery disease, GERD, osteoporosis, hypertension, CVA, acute kidney injury Colonoscopy done 05/26/2014 by [facility] in [place], [place]: colon polyps removed. Recommended to repeat in 5years. EGD done 05/26/2014 by [facility]: mild gastritis.   February 26, 2022 ferritin 5.4 B12 163 zinc 51  June 04, 2022 WBC 14.7 hemoglobin 10.9 MCV 76 platelet count 637; 76 segs 10 lymphs 11 monos 2 eos 1 basophil. Ferritin 7.1  July 04 2022:  [facility] Consult In August 2023 patient was placed on oral iron, zinc, B12 injections.  She stopped taking oral iron for a time because it was costing too much.  She is now taking low dose oral iron with D40 and folic acid as well as zinc.  She takes the supplements bid on an empty stomach.  Has some diarrhea which may be related to her medications.  Received PRBC's in 2015 in the setting of sepsis.  No history of IV iron.  No hematochezia, melena, BRBPR.  Stools dark from oral iron.  Has not had endoscopy since 2015.  Takes ibuprofen for pain and is on ASA 81 mg daily  Social:  Widowed.  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  Quit smoking 16 yrs ago.  EtOH very seldom.    [facility] Mother died 30 fall Father died 65's dementia, parkinsons Brother x 3.  All have had Parkinson's disease.  One alive.  Two died;  one had prostate cancer       Review of Systems  Constitutional:  Negative for appetite change, chills, fatigue, fever and unexpected weight change.  HENT:   Positive  for hearing loss. Negative for mouth sores, nosebleeds, sore throat, trouble swallowing and voice change.   Eyes:  Positive for eye problems. Negative for icterus.       Has cataracts which interfere with vision as a result uses a walker  Respiratory:  Positive for shortness of breath. Negative for chest tightness, cough, hemoptysis and wheezing.        PND:  none Orthopnea:  none DOE:    Cardiovascular:  Negative for chest pain, leg swelling and palpitations.       PND:  none Orthopnea:  none  Gastrointestinal:  Negative for abdominal pain, blood in stool, constipation, diarrhea, nausea and vomiting.  Endocrine: Negative for hot flashes.       Cold intolerance:  none Heat intolerance:  none  Genitourinary:  Negative for bladder incontinence, difficulty urinating, dysuria, frequency, hematuria and nocturia.   Musculoskeletal:  Positive for arthralgias, back pain and gait problem. Negative for myalgias, neck pain and neck stiffness.       Has chronic back pain  Skin:  Negative for itching, rash and wound.  Neurological:  Positive for extremity weakness and gait problem. Negative for dizziness, headaches, light-headedness, numbness, seizures and speech difficulty.  Hematological:  Negative for adenopathy. Bruises/bleeds easily.  Psychiatric/Behavioral:  Negative for sleep disturbance and suicidal ideas. The patient is not nervous/anxious.     MEDICAL HISTORY: Past Medical History:  Diagnosis Date   Allergy    Anxiety    Arthritis  Cancer ([place])    hx of melanoma    Colon polyps    COPD (chronic obstructive pulmonary disease) (HCC)    Coronary artery disease    Dyspnea    with exertion    Edema    in lower extremties    Emphysema of lung (HCC)    GERD (gastroesophageal reflux disease)    H/O hiatal hernia    Hyperlipidemia    Hypertension    Inverted nipple    bilateral, pt says that's normal   Myocardial infarct, old 2008   no stent placed   Osteoporosis    Pneumonia     hx of    Sepsis ([place])    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    Stroke [facility])    TIA    SURGICAL HISTORY: Past Surgical History:  Procedure Laterality Date   ABDOMINAL HYSTERECTOMY  1998   CHOLECYSTECTOMY  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   HERNIA REPAIR     umbilical, ventral   INCISIONAL HERNIA REPAIR N/A 10/26/2020   Procedure: DIAGNOSTIC LAPAROSCOPY MESH EXPLANTATION; SMALL BOWEL RESECTION; PRIMARY REPAIR OF INCISIONAL HERNIA;  Surgeon: Kieth Brightly, [name], MD;  Location: [facility];  Service: General;  Laterality: N/A;   INSERTION OF ILIAC STENT  2008    SOCIAL HISTORY: Social History   Socioeconomic History   Marital status: Widowed    Spouse name: Not on file   Number of children: Not on file   Years of education: Not on file   Highest education level: Not on file  Occupational History   Not on file  Tobacco Use   Smoking status: Former    Packs/day: 1.50    Years: 44.00    Total pack years: 66.00    Types: Cigarettes    Quit date: 07/28/2005    Years since quitting: 16.9   Smokeless tobacco: Never  Vaping Use   Vaping Use: Never used  Substance and Sexual Activity   Alcohol use: Yes    Alcohol/week: 2.0 standard drinks of alcohol    Types: 2 Shots of liquor per week    Comment: rarely   Drug use: No   Sexual activity: Not Currently  Other Topics Concern   Not on file  Social History Narrative   Right handed   Caffeine 65 0z a day   Home is one story    Social Determinants of Radio broadcast assistant Strain: Not on file  Food Insecurity: Not on file  Transportation Needs: Not on file  Physical Activity: Not on file  Stress: Not on file  Social Connections: Not on file  Intimate Partner Violence: Not on file    FAMILY HISTORY Family History  Problem Relation Age of Onset   Hearing loss Father    Hearing loss Maternal Aunt    Hearing loss Maternal Grandmother    Cancer Maternal Grandmother         colon   Arthritis Mother    Cancer Paternal Grandmother        colon   Breast cancer Neg Hx     ALLERGIES:  is allergic to prednisone and statins.  MEDICATIONS:  Current Outpatient Medications  Medication Sig Dispense Refill   acetaminophen (TYLENOL) 500 MG tablet Take 500 mg by mouth every 6 (six) hours as needed for mild pain or moderate pain.     albuterol (VENTOLIN HFA) 108 (90 Base) MCG/ACT inhaler INHALE 1-2 PUFFS BY  MOUTH EVERY 6 HOURS AS NEEDED FOR WHEEZE OR SHORTNESS OF BREATH 6.7 each 1   ALPRAZolam (XANAX) 0.25 MG tablet Take 1 tablet (0.25 mg total) by mouth 2 (two) times daily as needed for anxiety. 30 tablet 4   aspirin 81 MG tablet Take 81 mg by mouth at bedtime.     budesonide-formoterol (SYMBICORT) 160-4.5 MCG/ACT inhaler TAKE 2 PUFFS BY MOUTH TWICE A DAY 30.6 each 1   Calcium Citrate-Vitamin D (CITRACAL + D PO) Take 1 tablet by mouth 2 (two) times daily. 600 mg calcium     colesevelam (WELCHOL) 625 MG tablet TAKE 1 TABLET (625 MG TOTAL) BY MOUTH 2 (TWO) TIMES DAILY WITH A MEAL. 180 tablet 1   fenofibrate (TRICOR) 145 MG tablet TAKE 1 TABLET BY MOUTH EVERY DAY 90 tablet 1   folic acid (FOLVITE) 1 MG tablet Take 1 tablet (1 mg total) by mouth daily. 90 tablet 1   furosemide (LASIX) 20 MG tablet Take 1 tablet (20 mg total) by mouth daily. 90 tablet 1   gabapentin (NEURONTIN) 100 MG capsule Take 1 capsule (100 mg total) by mouth 2 (two) times daily. 180 capsule 1   Lactobacillus (PROBIOTIC ACIDOPHILUS) CAPS Take 1 tablet by mouth daily.     loratadine (CLARITIN) 10 MG tablet TAKE 1 TABLET BY MOUTH EVERY DAY 90 tablet 1   magnesium oxide (MAG-OX) 400 (240 Mg) MG tablet TAKE 1 TABLET BY MOUTH TWICE A DAY 60 tablet 2   metoprolol succinate (TOPROL-XL) 50 MG 24 hr tablet Take 1 tablet (50 mg total) by mouth daily. Take with or immediately following a meal. 90 tablet 1   mometasone (NASONEX) 50 MCG/ACT nasal spray PLACE 2 SPRAYS INTO THE NOSE DAILY. 51 each 3   omeprazole  (PRILOSEC) 20 MG capsule TAKE 1 CAPSULE BY MOUTH EVERY DAY 90 capsule 1   primidone (MYSOLINE) 50 MG tablet Take 0.5 tablets (25 mg total) by mouth at bedtime. 45 tablet 1   raloxifene (EVISTA) 60 MG tablet TAKE 1 TABLET BY MOUTH EVERY DAY 90 tablet 1   SPIRIVA RESPIMAT 2.5 MCG/ACT AERS INHALE 2 PUFFS BY MOUTH PER DAY 12 g 1   triamcinolone cream (KENALOG) 0.1 % Apply 1 application topically 2 (two) times daily. (Patient taking differently: Apply 1 application  topically daily as needed (yeast infection under breast).) 30 g 0   zinc gluconate 50 MG tablet Take 1 tablet (50 mg total) by mouth daily. 90 tablet 1   Cyanocobalamin (B-12 COMPLIANCE INJECTION IJ) Inject as directed. One time a month (Patient not taking: Reported on 07/04/2022)     Ferric Maltol (ACCRUFER) 30 MG CAPS Take 1 capsule by mouth in the morning and at bedtime. (Patient not taking: Reported on 07/04/2022) 734 capsule 0   Garlic Oil 1937 MG CAPS Take 1,000 mg by mouth daily. (Patient not taking: Reported on 05/07/2022)     Current Facility-Administered Medications  Medication Dose Route Frequency Provider Last Rate Last Admin   cyanocobalamin (VITAMIN B12) injection 1,000 mcg  1,000 mcg Intramuscular Q30 days Janith Lima, MD   1,000 mcg at 03/24/22 1517    PHYSICAL EXAMINATION:  ECOG PERFORMANCE STATUS: 1 - Symptomatic but completely ambulatory   Vitals:   07/04/22 1306  BP: (!) 143/55  Pulse: (!) 103  Resp: 16  Temp: 97.8 F (36.6 C)  SpO2: 99%    Filed Weights   07/04/22 1306  Weight: 149 lb (67.6 kg)     Physical Exam Vitals and nursing  note reviewed.  Constitutional:      Appearance: Normal appearance. She is not toxic-appearing or diaphoretic.     Comments: Chronically ill appearing.  Has a walker  HENT:     Head: Normocephalic and atraumatic.     Right Ear: External ear normal.     Left Ear: External ear normal.     Nose: Nose normal. No congestion or rhinorrhea.  Eyes:     General: No scleral  icterus.    Extraocular Movements: Extraocular movements intact.     Conjunctiva/sclera: Conjunctivae normal.     Pupils: Pupils are equal, round, and reactive to light.  Cardiovascular:     Rate and Rhythm: Normal rate.     Heart sounds: No murmur heard.    No friction rub. No gallop.  Pulmonary:     Effort: Pulmonary effort is normal. No respiratory distress.     Breath sounds: Normal breath sounds. No stridor. No wheezing or rhonchi.  Abdominal:     General: Bowel sounds are normal. There is no distension.     Palpations: Abdomen is soft.     Tenderness: There is no abdominal tenderness. There is no guarding or rebound.     Hernia: No hernia is present.  Musculoskeletal:        General: No swelling, tenderness or deformity.     Cervical back: Normal range of motion and neck supple. No rigidity or tenderness.     Right lower leg: No edema.     Left lower leg: No edema.  Lymphadenopathy:     Head:     Right side of head: No submental, submandibular, tonsillar, preauricular, posterior auricular or occipital adenopathy.     Left side of head: No submental, submandibular, tonsillar, preauricular, posterior auricular or occipital adenopathy.     Cervical: No cervical adenopathy.     Right cervical: No superficial, deep or posterior cervical adenopathy.    Left cervical: No superficial, deep or posterior cervical adenopathy.     Upper Body:     Right upper body: No supraclavicular, axillary, pectoral or epitrochlear adenopathy.     Left upper body: No supraclavicular, axillary, pectoral or epitrochlear adenopathy.  Skin:    General: Skin is warm.     Coloration: Skin is not jaundiced or pale.     Findings: No bruising or erythema.  Neurological:     General: No focal deficit present.     Mental Status: She is alert and oriented to person, place, and time. Mental status is at baseline.     Cranial Nerves: No cranial nerve deficit.     Gait: Gait abnormal.  Psychiatric:        Mood  and Affect: Mood normal.        Behavior: Behavior normal.        Thought Content: Thought content normal.        Judgment: Judgment normal.     LABORATORY DATA: I have personally reviewed the data as listed:  Appointment on 07/04/2022  Component Date Value Ref Range Status   Retic Ct Pct 07/04/2022 1.7  0.4 - 3.1 % Final   RBC. 07/04/2022 4.45  3.87 - 5.11 MIL/uL Final   Retic Count, Absolute 07/04/2022 73.9  19.0 - 186.0 K/uL Final   Immature Retic Fract 07/04/2022 21.9 (H)  2.3 - 15.9 % Final   Performed at [facility], [place] [address], [place], [place] [address]   Vitamin B-12 07/04/2022 813  180 - 914  pg/mL Final   Comment: (NOTE) This assay is not validated for testing neonatal or myeloproliferative syndrome specimens for Vitamin B12 levels. Performed at [facility], [place] [address] [place], [place] [address]    DAT, complement 07/04/2022 NEG   Final   DAT, IgG 07/04/2022    Final                   Value:NEG Performed at [facility], [place] [address], [place], [place] [address]    Sodium 07/04/2022 140  135 - 145 mmol/L Final   Potassium 07/04/2022 3.8  3.5 - 5.1 mmol/L Final   Chloride 07/04/2022 104  98 - 111 mmol/L Final   CO2 07/04/2022 31  22 - 32 mmol/L Final   Glucose, Bld 07/04/2022 91  70 - 99 mg/dL Final   Glucose reference range applies only to samples taken after fasting for at least 8 hours.   BUN 07/04/2022 13  8 - 23 mg/dL Final   Creatinine 07/04/2022 0.69  0.44 - 1.00 mg/dL Final   Calcium 07/04/2022 9.7  8.9 - 10.3 mg/dL Final   Total Protein 07/04/2022 6.4 (L)  6.5 - 8.1 g/dL Final   Albumin 07/04/2022 2.9 (L)  3.5 - 5.0 g/dL Final   AST 07/04/2022 13 (L)  15 - 41 U/L Final   ALT 07/04/2022 5  0 - 44 U/L Final   Alkaline Phosphatase 07/04/2022 47  38 - 126 U/L Final   Total Bilirubin 07/04/2022 0.3  0.3 - 1.2 mg/dL Final   GFR, Estimated 07/04/2022 >60  >60 mL/min Final   Comment:  (NOTE) Calculated using the CKD-EPI Creatinine Equation (2021)    Anion gap 07/04/2022 5  5 - 15 Final   Performed at [facility], [place] [address], [place], [place] [address]   WBC Count 07/04/2022 15.1 (H)  4.0 - 10.5 K/uL Final   RBC 07/04/2022 4.45  3.87 - 5.11 MIL/uL Final   Hemoglobin 07/04/2022 10.7 (L)  12.0 - 15.0 g/dL Final   Comment: Reticulocyte Hemoglobin testing may be clinically indicated, consider ordering this additional test [id number]    HCT 07/04/2022 35.0 (L)  36.0 - 46.0 % Final   MCV 07/04/2022 78.7 (L)  80.0 - 100.0 fL Final   MCH 07/04/2022 24.0 (L)  26.0 - 34.0 pg Final   MCHC 07/04/2022 30.6  30.0 - 36.0 g/dL Final   RDW 07/04/2022 17.5 (H)  11.5 - 15.5 % Final   Platelet Count 07/04/2022 650 (H)  150 - 400 K/uL Final   nRBC 07/04/2022 0.0  0.0 - 0.2 % Final   Neutrophils Relative % 07/04/2022 79  % Final   Neutro Abs 07/04/2022 11.8 (H)  1.7 - 7.7 K/uL Final   Lymphocytes Relative 07/04/2022 8  % Final   Lymphs Abs 07/04/2022 1.3  0.7 - 4.0 K/uL Final   Monocytes Relative 07/04/2022 10  % Final   Monocytes Absolute 07/04/2022 1.5 (H)  0.1 - 1.0 K/uL Final   Eosinophils Relative 07/04/2022 2  % Final   Eosinophils Absolute 07/04/2022 0.4  0.0 - 0.5 K/uL Final   Basophils Relative 07/04/2022 0  % Final   Basophils Absolute 07/04/2022 0.0  0.0 - 0.1 K/uL Final   WBC Morphology 07/04/2022 MORPHOLOGY UNREMARKABLE   Final   Smear Review 07/04/2022 Normal platelet morphology   Final   Immature Granulocytes 07/04/2022 1  % Final   Abs Immature Granulocytes 07/04/2022 0.09 (H)  0.00 -  0.07 K/uL Final   Polychromasia 07/04/2022 PRESENT   Final   Performed at [facility], [place] [address], [place], [place] [address]    RADIOGRAPHIC STUDIES: I have personally reviewed the radiological images as listed and agree with the findings in the report  No results found.  ASSESSMENT/PLAN 81 y.o. female with medical  history notable for arthritis, history of melanoma, colon polyps, COPD, coronary artery disease, GERD, osteoporosis, hypertension, CVA, acute kidney injury.  Patient seen for evaluation and management of anemia  Anemia:  Likely multifactorial with major component being chronic GI blood loss.  Contributing factors are likely CKD, B12 deficiency.  Patient on chronic antiplatelet therapy with ASA.  Has history of colon polyps.  Will arrange for IV iron.  Currently receiving B12 injections.   Will obtain CBC with diff, CMP, Ferritin, B12, folate, retic count,  DAT, Haptoglobin, SPEP with IEP.    Therapeutics:  Since patient has symptomatic anemia with Hgb < 10  and has not tolerated/been compliant with oral iron will arrange for IV iron replacement.   Given the severe symptoms we prefer to replete iron stores in one or two visits rather than over the course of several months.  In addition ongoing blood loss exceeds the capacity of oral iron to meet needs. A discussion regarding risks was had with the patient.  IV iron has the potential to cause allergic reactions, including potentially life-threatening anaphylaxis.   IV iron may be associated with non-allergic infusion reactions including self-limiting urticaria, palpitations, dizziness, and neck and back spasm; generally, these occur in <1 percent of individuals and do not progress to more serious reactions. The non-allergic reaction consisting of flushing of the face and myalgias of the chest and back.   After discussion of the risks and benefits of IV iron therapy patient has elected to proceed with parenteral iron therapy.       Cancer Staging  No matching staging information was found for the patient.   No problem-specific Assessment & Plan notes found for this encounter.   Orders Placed This Encounter  Procedures   CBC with Differential ([place] Only)    Standing Status:   Future    Number of Occurrences:   1    Standing Expiration  Date:   07/05/2023   CMP ([place] only)    Standing Status:   Future    Number of Occurrences:   1    Standing Expiration Date:   07/05/2023   Copper, serum    Standing Status:   Future    Number of Occurrences:   1    Standing Expiration Date:   07/05/2023   Ferritin    Standing Status:   Future    Number of Occurrences:   1    Standing Expiration Date:   07/05/2023   Folate    Standing Status:   Future    Number of Occurrences:   1    Standing Expiration Date:   07/05/2023   Haptoglobin    Standing Status:   Future    Number of Occurrences:   1    Standing Expiration Date:   07/05/2023   Zinc    Standing Status:   Future    Number of Occurrences:   1    Standing Expiration Date:   07/05/2023   Vitamin B12    Standing Status:   Future    Number of Occurrences:   1    Standing Expiration Date:  07/05/2023   Reticulocytes    Standing Status:   Future    Number of Occurrences:   1    Standing Expiration Date:   07/05/2023   Multiple Myeloma Panel (SPEP&IFE w/QIG)    Standing Status:   Future    Number of Occurrences:   1    Standing Expiration Date:   07/05/2023   Kappa/lambda light chains    Standing Status:   Future    Number of Occurrences:   1    Standing Expiration Date:   07/05/2023   Ambulatory referral to Gastroenterology    Referral Priority:   Routine    Referral Type:   Consultation    Referral Reason:   Specialty Services Required    Number of Visits Requested:   1   Direct antiglobulin test (not at [facility])    Standing Status:   Future    Number of Occurrences:   1    Standing Expiration Date:   07/05/2023    All questions were answered. The patient knows to call the clinic with any problems, questions or concerns.  This note was electronically signed.    Barbee Cough, MD  07/04/2022 5:10 PM

## 2022-07-06 LAB — HAPTOGLOBIN: Haptoglobin: 319 mg/dL (ref 42–346)

## 2022-07-07 ENCOUNTER — Telehealth: Payer: Self-pay | Admitting: Oncology

## 2022-07-07 ENCOUNTER — Other Ambulatory Visit: Payer: Self-pay | Admitting: Internal Medicine

## 2022-07-07 DIAGNOSIS — K219 Gastro-esophageal reflux disease without esophagitis: Secondary | ICD-10-CM

## 2022-07-07 LAB — KAPPA/LAMBDA LIGHT CHAINS
Kappa free light chain: 70.7 mg/L — ABNORMAL HIGH (ref 3.3–19.4)
Kappa, lambda light chain ratio: 1.35 (ref 0.26–1.65)
Lambda free light chains: 52.3 mg/L — ABNORMAL HIGH (ref 5.7–26.3)

## 2022-07-07 NOTE — Telephone Encounter (Signed)
Spoke with patient confirming upcoming appointments  

## 2022-07-09 ENCOUNTER — Encounter: Payer: Self-pay | Admitting: Family Medicine

## 2022-07-09 ENCOUNTER — Ambulatory Visit (INDEPENDENT_AMBULATORY_CARE_PROVIDER_SITE_OTHER): Payer: Medicare Other | Admitting: Family Medicine

## 2022-07-09 ENCOUNTER — Ambulatory Visit (INDEPENDENT_AMBULATORY_CARE_PROVIDER_SITE_OTHER): Payer: Medicare Other

## 2022-07-09 ENCOUNTER — Other Ambulatory Visit: Payer: Self-pay | Admitting: Family Medicine

## 2022-07-09 VITALS — BP 100/60 | HR 103 | Ht 60.0 in | Wt 148.0 lb

## 2022-07-09 DIAGNOSIS — M25561 Pain in right knee: Secondary | ICD-10-CM

## 2022-07-09 DIAGNOSIS — M1711 Unilateral primary osteoarthritis, right knee: Secondary | ICD-10-CM | POA: Diagnosis not present

## 2022-07-09 DIAGNOSIS — M47816 Spondylosis without myelopathy or radiculopathy, lumbar region: Secondary | ICD-10-CM | POA: Diagnosis not present

## 2022-07-09 DIAGNOSIS — M48061 Spinal stenosis, lumbar region without neurogenic claudication: Secondary | ICD-10-CM | POA: Insufficient documentation

## 2022-07-09 DIAGNOSIS — M48062 Spinal stenosis, lumbar region with neurogenic claudication: Secondary | ICD-10-CM | POA: Diagnosis not present

## 2022-07-09 DIAGNOSIS — M545 Low back pain, unspecified: Secondary | ICD-10-CM | POA: Diagnosis not present

## 2022-07-09 NOTE — Assessment & Plan Note (Signed)
Patient's history does seem to have more of a spinal stenosis noted.  Discussed icing regimen and home exercises, discussed which activities to do and which ones to avoid.  We discussed with patient about gabapentin and increasing the amount that she is doing at this moment very slowly.  Will get x-rays to further evaluate as well.  Follow-up again in 6 to 8 weeks

## 2022-07-09 NOTE — Assessment & Plan Note (Signed)
Patient does have some atrophy of the lower extremities and difficult to tell if it is secondary to the degenerative arthritis of the knee or the potential for some more lumbar spinal stenosis that could be contributing.  Given injection is wearing any responded well.  Discussed which activities to do and which ones to avoid.  Follow-up again in 6 to 8 weeks and could be candidate for viscosupplementation if needed.  X-rays pending to further evaluate the amount of arthritis that is contributing

## 2022-07-09 NOTE — Patient Instructions (Addendum)
Good to see you  Knee injection today Increase your gabapentin to '100mg'$  during the day '200mg'$  at night  Start PT for back and knee  Follow up 4-6 weeks to see you you are doing.

## 2022-07-10 LAB — MULTIPLE MYELOMA PANEL, SERUM
Albumin SerPl Elph-Mcnc: 2.3 g/dL — ABNORMAL LOW (ref 2.9–4.4)
Albumin/Glob SerPl: 0.7 (ref 0.7–1.7)
Alpha 1: 0.5 g/dL — ABNORMAL HIGH (ref 0.0–0.4)
Alpha2 Glob SerPl Elph-Mcnc: 0.9 g/dL (ref 0.4–1.0)
B-Globulin SerPl Elph-Mcnc: 1.2 g/dL (ref 0.7–1.3)
Gamma Glob SerPl Elph-Mcnc: 1 g/dL (ref 0.4–1.8)
Globulin, Total: 3.6 g/dL (ref 2.2–3.9)
IgA: 370 mg/dL (ref 64–422)
IgG (Immunoglobin G), Serum: 1089 mg/dL (ref 586–1602)
IgM (Immunoglobulin M), Srm: 90 mg/dL (ref 26–217)
Total Protein ELP: 5.9 g/dL — ABNORMAL LOW (ref 6.0–8.5)

## 2022-07-11 ENCOUNTER — Ambulatory Visit: Payer: Medicare Other

## 2022-07-11 ENCOUNTER — Inpatient Hospital Stay: Payer: Medicare Other

## 2022-07-11 VITALS — BP 133/53 | HR 97 | Temp 97.8°F | Resp 17

## 2022-07-11 DIAGNOSIS — Z79899 Other long term (current) drug therapy: Secondary | ICD-10-CM | POA: Diagnosis not present

## 2022-07-11 DIAGNOSIS — D649 Anemia, unspecified: Secondary | ICD-10-CM | POA: Diagnosis not present

## 2022-07-11 DIAGNOSIS — D539 Nutritional anemia, unspecified: Secondary | ICD-10-CM

## 2022-07-11 MED ORDER — ACETAMINOPHEN 325 MG PO TABS
650.0000 mg | ORAL_TABLET | Freq: Once | ORAL | Status: AC
  Administered 2022-07-11: 650 mg via ORAL
  Filled 2022-07-11: qty 2

## 2022-07-11 MED ORDER — SODIUM CHLORIDE 0.9 % IV SOLN: Freq: Once | INTRAVENOUS | Status: AC

## 2022-07-11 MED ORDER — SODIUM CHLORIDE 0.9 % IV SOLN
510.0000 mg | Freq: Once | INTRAVENOUS | Status: AC
  Administered 2022-07-11: 510 mg via INTRAVENOUS
  Filled 2022-07-11: qty 510

## 2022-07-11 MED ORDER — LORATADINE 10 MG PO TABS
10.0000 mg | ORAL_TABLET | Freq: Every day | ORAL | Status: DC
  Administered 2022-07-11: 10 mg via ORAL
  Filled 2022-07-11: qty 1

## 2022-07-11 NOTE — Patient Instructions (Signed)

## 2022-07-12 LAB — ZINC: Zinc: 94 ug/dL (ref 44–115)

## 2022-07-12 LAB — COPPER, SERUM: Copper: 111 ug/dL (ref 80–158)

## 2022-07-16 ENCOUNTER — Other Ambulatory Visit: Payer: Self-pay | Admitting: Internal Medicine

## 2022-07-16 DIAGNOSIS — E782 Mixed hyperlipidemia: Secondary | ICD-10-CM

## 2022-08-01 ENCOUNTER — Other Ambulatory Visit: Payer: Self-pay | Admitting: Internal Medicine

## 2022-08-01 DIAGNOSIS — I10 Essential (primary) hypertension: Secondary | ICD-10-CM

## 2022-08-01 DIAGNOSIS — E782 Mixed hyperlipidemia: Secondary | ICD-10-CM

## 2022-08-06 ENCOUNTER — Encounter: Payer: Self-pay | Admitting: Oncology

## 2022-08-06 NOTE — Progress Notes (Deleted)
NEUROLOGY FOLLOW UP OFFICE NOTE  Vondalee Liedel [place]:[id number]  Subjective:  Dazaria Rosenbauer is a 82 y.o. year old HTN, HLD, CAD, CKD3, COPD, OA, GERD, shingles (on gabapentin), melanoma (s/p resection and immunotherapy - unknown what agent, last in 1993), former smoker, and anxiety who we last saw on 05/07/22.  To briefly review: Patient has tremors, mostly in her hands, but also in her body for about the last 5 years. It is getting progressively worse. Her daughter also notices her lip quivering for the last couple of years. It seems to be worse when holding something. There is no head tremor or voice changes. She has not noticed if the tremor changes with alcohol. Patient is bothered by this tremor, particularly because people notice and ask her about it when she is in public.   Regarding family history, her father had Parkinson's disease, and 2 brothers with Parkinson's disease. Her daughter has essential tremor and is treated by Dr. Tomi Likens. One uncle is treated by Dr. Carles Collet for Parkinson's disease. Per daughter, genetic testing of uncle did not reveal any hereditary cause.   Patient was seen by her PCP, Dr. Ronnald Ramp, on 02/18/22 and reported progressive bilateral lower extremity weakness with shaking and burning for 6 months. She had a fall in May 2023 when she tripped over her cat. She also mentioned lightheadedness and tremors in her hands. She was referred to neurology for further evaluation.   When asking patient about these symptoms, she endorses 2 falls when tripping over something. She occasional numbness, tingling, and weakness in her legs. She denies feeling stuck and not being able to move. She denies imbalance. She does not use an assistive device to walk. Per daughter, she is slow when she is walking though.   She endorses chronic low back pain. When she walks for long distances, her back hurts more and she begins having leg weakness. She feels better after sitting.   Of note, patient was  found to be anemic in 02/2022. She was B12 deficient. She was started on B12 injections. She did 4 injections in August, then once monthly in Sept and Oct. She was also put on iron, folic acid, and zinc.  Most recent Assessment and Plan (05/07/22): Patient's tremor is most consistent with essential tremor. There is no rest tremor or bradykinesia to suggest parkinsonism despite her family history of PD. Patient's daughter also has essential tremor, so this is likely also familial. After discussion with patient, she elected for treatment. She is already on a beta-blocker, so we will start with a low dose of primidone today.   Regarding her leg weakness and cautious gait, her exam is significant for upper motor neuron signs in the cervical and lumbosacral regions suggesting a possible cervical spine localization. I will get an MRI of the cervical spine and some labs to evaluate for mimics of myelopathy. Patient would also benefit from PT.   PLAN: -Blood work: vit E, copper -MRI cervical spine wo contrast -Primidone 25 mg daily for essential tremor -Physical therapy for gait instability and weakness of legs  Since their last visit: MRI cervical spine was unremarkable. Vit E and copper were normal. Patient elected not to start PT. ***  Tremor***  MEDICATIONS:  Outpatient Encounter Medications as of 08/13/2022  Medication Sig   acetaminophen (TYLENOL) 500 MG tablet Take 500 mg by mouth every 6 (six) hours as needed for mild pain or moderate pain.   albuterol (VENTOLIN HFA) 108 (90 Base) MCG/ACT inhaler INHALE 1-2  PUFFS BY MOUTH EVERY 6 HOURS AS NEEDED FOR WHEEZE OR SHORTNESS OF BREATH   ALPRAZolam (XANAX) 0.25 MG tablet Take 1 tablet (0.25 mg total) by mouth 2 (two) times daily as needed for anxiety.   aspirin 81 MG tablet Take 81 mg by mouth at bedtime.   budesonide-formoterol (SYMBICORT) 160-4.5 MCG/ACT inhaler TAKE 2 PUFFS BY MOUTH TWICE A DAY   Calcium Citrate-Vitamin D (CITRACAL + D PO) Take 1  tablet by mouth 2 (two) times daily. 600 mg calcium   colesevelam (WELCHOL) 625 MG tablet TAKE 1 TABLET (625 MG TOTAL) BY MOUTH 2 (TWO) TIMES DAILY WITH A MEAL.   Cyanocobalamin (B-12 COMPLIANCE INJECTION IJ) Inject as directed. One time a month (Patient not taking: Reported on 07/04/2022)   fenofibrate (TRICOR) 145 MG tablet TAKE 1 TABLET BY MOUTH EVERY DAY   Ferric Maltol (ACCRUFER) 30 MG CAPS Take 1 capsule by mouth in the morning and at bedtime. (Patient not taking: Reported on [id number])   folic acid (FOLVITE) 1 MG tablet Take 1 tablet (1 mg total) by mouth daily.   furosemide (LASIX) 20 MG tablet Take 1 tablet (20 mg total) by mouth daily.   gabapentin (NEURONTIN) 100 MG capsule Take 1 capsule (100 mg total) by mouth 2 (two) times daily.   Garlic Oil 123XX123 MG CAPS Take 1,000 mg by mouth daily. (Patient not taking: Reported on 05/07/2022)   Lactobacillus (PROBIOTIC ACIDOPHILUS) CAPS Take 1 tablet by mouth daily.   loratadine (CLARITIN) 10 MG tablet TAKE 1 TABLET BY MOUTH EVERY DAY   magnesium oxide (MAG-OX) 400 (240 Mg) MG tablet TAKE 1 TABLET BY MOUTH TWICE A DAY   metoprolol succinate (TOPROL-XL) 50 MG 24 hr tablet TAKE 1 TABLET BY MOUTH TWICE A DAY   mometasone (NASONEX) 50 MCG/ACT nasal spray PLACE 2 SPRAYS INTO THE NOSE DAILY.   omeprazole (PRILOSEC) 20 MG capsule TAKE 1 CAPSULE BY MOUTH EVERY DAY   primidone (MYSOLINE) 50 MG tablet Take 0.5 tablets (25 mg total) by mouth at bedtime.   raloxifene (EVISTA) 60 MG tablet TAKE 1 TABLET BY MOUTH EVERY DAY   SPIRIVA RESPIMAT 2.5 MCG/ACT AERS INHALE 2 PUFFS BY MOUTH PER DAY   triamcinolone cream (KENALOG) 0.1 % Apply 1 application topically 2 (two) times daily. (Patient taking differently: Apply 1 application  topically daily as needed (yeast infection under breast).)   zinc gluconate 50 MG tablet Take 1 tablet (50 mg total) by mouth daily.   Facility-Administered Encounter Medications as of 08/13/2022  Medication   cyanocobalamin (VITAMIN  B12) injection 1,000 mcg    PAST MEDICAL HISTORY: Past Medical History:  Diagnosis Date   Allergy    Anxiety    Arthritis    Cancer ([place])    hx of melanoma    Colon polyps    COPD (chronic obstructive pulmonary disease) (HCC)    Coronary artery disease    Dyspnea    with exertion    Edema    in lower extremties    Emphysema of lung ([facility]CC)    GERD (gastroesophageal reflux disease)    H/O hiatal hernia    Hyperlipidemia    Hypertension    Inverted nipple    bilateral, pt says that's normal   Myocardial infarct, old 2008   no stent placed   Osteoporosis    Pneumonia    hx of    Sepsis ([place])    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  Stroke [facility])    TIA    PAST SURGICAL HISTORY: Past Surgical History:  Procedure Laterality Date   ABDOMINAL HYSTERECTOMY  1998   CHOLECYSTECTOMY  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   HERNIA REPAIR     umbilical, ventral   INCISIONAL HERNIA REPAIR N/A 10/26/2020   Procedure: DIAGNOSTIC LAPAROSCOPY MESH EXPLANTATION; SMALL BOWEL RESECTION; PRIMARY REPAIR OF INCISIONAL HERNIA;  Surgeon: Kinsinger, [name], MD;  Location: [facility];  Service: General;  Laterality: N/A;   INSERTION OF ILIAC STENT  2008    ALLERGIES: Allergies  Allergen Reactions   Prednisone Other (See Comments)    Leg pain,light headed   Statins     Myalgias- per patient, she feels better off the medication    FAMILY HISTORY: Family History  Problem Relation Age of Onset   Hearing loss Father    Hearing loss Maternal Aunt    Hearing loss Maternal Grandmother    Cancer Maternal Grandmother        colon   Arthritis Mother    Cancer Paternal Grandmother        colon   Breast cancer Neg Hx     SOCIAL HISTORY: Social History   Tobacco Use   Smoking status: Former    Packs/day: 1.50    Years: 44.00    Total pack years: 66.00    Types: Cigarettes    Quit date: 07/28/2005    Years since quitting: 17.0   Smokeless  tobacco: Never  Vaping Use   Vaping Use: Never used  Substance Use Topics   Alcohol use: Yes    Alcohol/week: 2.0 standard drinks of alcohol    Types: 2 Shots of liquor per week    Comment: rarely   Drug use: No   Social History   Social History Narrative   Right handed   Caffeine 64 0z a day   Home is one story       Objective:  Vital Signs:  There were no vitals taken for this visit.  ***  Labs and Imaging review: New results: Normal or unremarkable: copper, vit E, B12 (813), IFE, kappa/lambda light chains, zinc  MRI cervical spine wo contrast (05/24/22): FINDINGS: Alignment: Straightening of cervical lordosis.   Vertebrae: No fracture, evidence of discitis, or bone lesion.   Cord: Normal signal and morphology.   Posterior Fossa, vertebral arteries, paraspinal tissues: Trace dependent pleural fluid on the right. No flow void seen within the left vertebral artery, the non dominant side.   Disc levels:   C2-3: Mild facet spurring with ligamentum flavum thickening. No neural compression   C3-4: Mild facet spurring with tiny central protrusion   C4-5: Degenerative facet spurring and mild disc space narrowing   C5-6: Disc narrowing and bulging with endplate ridging. Mild facet spurring.   C6-7: Disc space narrowing and bulging with mild endplate ridging. Patent canal and foramina   C7-T1:Unremarkable.   IMPRESSION: 1. Ordinary cervical spine degeneration as described. Diffusely patent canal and foramina. Normal cord signal. 2. Slow or absent flow in the non dominant left vertebral artery.  Lumbar xray (07/09/22): FINDINGS: Vertebral body height and alignment are maintained. Intervertebral disc space height is normal. Facet degenerative disease in the mid and lower lumbar spine appears worst at L4-5 and L5-S1 unchanged. Paraspinous structures again demonstrate extensive atherosclerosis.   IMPRESSION: No acute abnormality.   Lower lumbar facet  arthropathy appears unchanged.   Aortic Atherosclerosis (ICD10-I70.0).  Previously reviewed results:      Lab  Results  Component Value Date    HGBA1C 5.7 08/21/2021      Recent Labs       Lab Results  Component Value Date    VITAMINB12 163 (L) 02/26/2022      Recent Labs       Lab Results  Component Value Date    TSH 2.60 02/21/2022        Normal or unremarkable: B1, BMP Zinc low at 51 Folate low at 4.4 Iron low at 22 CBC significant for thrombocytosis (560)   Imaging: CT cervical spine wo contrast (08/31/19): FINDINGS: Alignment: No significant listhesis.   Skull base and vertebrae: No acute fracture. Vertebral body heights are maintained apart from degenerative endplate irregularity at C5-C6 to C7-T1. No destructive osseous lesion.   Soft tissues and spinal canal: No prevertebral fluid or swelling. No visible canal hematoma.   Disc levels: Degenerative changes are present primarily at C5-C6 and C6-C7. There is no high-grade osseous encroachment on the spinal canal.   Upper chest: Emphysema.   Other: None.   IMPRESSION: No acute cervical spine fracture.   CT head wo contrast (08/31/19): FINDINGS: Brain: There is no acute intracranial hemorrhage, mass-effect, or edema. Gray-white differentiation is preserved. There is no extra-axial fluid collection. Patchy hypoattenuation in the supratentorial white matter is nonspecific but may reflect mild to moderate chronic microvascular ischemic changes. Ventricles and sulci are within normal limits in size and configuration for patient age.   Vascular: There is atherosclerotic calcification at the skull base.   Skull: Calvarium is unremarkable.   Sinuses/Orbits: Minor mucosal thickening. Evidence of chronic right maxillary sinus inflammation. Orbits are unremarkable.   Other: None.   IMPRESSION: No evidence of acute intracranial injury. Chronic/nonemergent findings detailed above.   Lumbar spine xray  (09/01/17): FINDINGS: Six non-rib-bearing lumbar type segments.   Bones appear mildly demineralized.   Vertebral body and disc space heights maintained.   Facet degenerative changes lower lumbar spine.   Scattered mild endplate spur formation.   No acute fracture, subluxation or bone destruction.   No spondylolysis.   Extensive atherosclerotic calcifications of the aorta and iliac arteries with stents identified at the RIGHT common and external iliac arteries.   SI joints preserved.   IMPRESSION: Degenerative changes of the lumbar spine.   No acute abnormalities.  Assessment/Plan:  This is Ciena Erb, a 82 y.o. female with essential tremor*** ***   Plan: ***-Continue primidone 25 mg daily -PT?***  Return to clinic in ***  Total time spent reviewing records, interview, history/exam, documentation, and coordination of care on day of encounter:  *** min  Kai Levins, MD

## 2022-08-13 ENCOUNTER — Ambulatory Visit: Payer: 59 | Admitting: Neurology

## 2022-08-13 NOTE — Progress Notes (Signed)
NEUROLOGY FOLLOW UP OFFICE NOTE  Nela Bascom [id number]  Subjective:  Tziporah Knoke is a 82 y.o. year old HTN, HLD, CAD, CKD3, COPD, OA, GERD, shingles (on gabapentin), melanoma (s/p resection and immunotherapy - unknown what agent, last in 1993), former smoker, and anxiety who we last saw on 05/07/22.   To briefly review: Patient has tremors, mostly in her hands, but also in her body for about the last 5 years. It is getting progressively worse. Her daughter also notices her lip quivering for the last couple of years. It seems to be worse when holding something. There is no head tremor or voice changes. She has not noticed if the tremor changes with alcohol. Patient is bothered by this tremor, particularly because people notice and ask her about it when she is in public.   Regarding family history, her father had Parkinson's disease, and 2 brothers with Parkinson's disease. Her daughter has essential tremor and is treated by Dr. Tomi Likens. One uncle is treated by Dr. Carles Collet for Parkinson's disease. Per daughter, genetic testing of uncle did not reveal any hereditary cause.   Patient was seen by her PCP, Dr. Ronnald Ramp, on 02/18/22 and reported progressive bilateral lower extremity weakness with shaking and burning for 6 months. She had a fall in May 2023 when she tripped over her cat. She also mentioned lightheadedness and tremors in her hands. She was referred to neurology for further evaluation.   When asking patient about these symptoms, she endorses 2 falls when tripping over something. She occasional numbness, tingling, and weakness in her legs. She denies feeling stuck and not being able to move. She denies imbalance. She does not use an assistive device to walk. Per daughter, she is slow when she is walking though.   She endorses chronic low back pain. When she walks for long distances, her back hurts more and she begins having leg weakness. She feels better after sitting.   Of note, patient was  found to be anemic in 02/2022. She was B12 deficient. She was started on B12 injections. She did 4 injections in August, then once monthly in Sept and Oct. She was also put on iron, folic acid, and zinc.   Most recent Assessment and Plan (05/07/22): Patient's tremor is most consistent with essential tremor. There is no rest tremor or bradykinesia to suggest parkinsonism despite her family history of PD. Patient's daughter also has essential tremor, so this is likely also familial. After discussion with patient, she elected for treatment. She is already on a beta-blocker, so we will start with a low dose of primidone today.   Regarding her leg weakness and cautious gait, her exam is significant for upper motor neuron signs in the cervical and lumbosacral regions suggesting a possible cervical spine localization. I will get an MRI of the cervical spine and some labs to evaluate for mimics of myelopathy. Patient would also benefit from PT.   PLAN: -Blood work: vit E, copper -MRI cervical spine wo contrast -Primidone 25 mg daily for essential tremor -Physical therapy for gait instability and weakness of legs   Since their last visit: MRI cervical spine was unremarkable. Vit E and copper were normal. Patient elected not to start PT.   Patient has a walker for ambulation today. She states she is doing this as a precaution because she has felt weak lately due to anemia. She is taking iron and awaiting endoscopy and colonoscopy to see if she has any GI bleeding.    Patient  started primidone 25 mg daily for essential tremor. She thinks this has helped about 75%. She does not tremor as much, but gets an occasional myoclonic jerk over the last month if she is holding her thumb.  MEDICATIONS:  Outpatient Encounter Medications as of 08/22/2022  Medication Sig   acetaminophen (TYLENOL) 500 MG tablet Take 500 mg by mouth every 6 (six) hours as needed for mild pain or moderate pain.   albuterol (VENTOLIN HFA)  108 (90 Base) MCG/ACT inhaler INHALE 1-2 PUFFS BY MOUTH EVERY 6 HOURS AS NEEDED FOR WHEEZE OR SHORTNESS OF BREATH   ALPRAZolam (XANAX) 0.25 MG tablet Take 1 tablet (0.25 mg total) by mouth 2 (two) times daily as needed for anxiety.   aspirin 81 MG tablet Take 81 mg by mouth at bedtime.   budesonide-formoterol (SYMBICORT) 160-4.5 MCG/ACT inhaler TAKE 2 PUFFS BY MOUTH TWICE A DAY   Calcium Citrate-Vitamin D (CITRACAL + D PO) Take 1 tablet by mouth 2 (two) times daily. 600 mg calcium   colesevelam (WELCHOL) 625 MG tablet TAKE 1 TABLET (625 MG TOTAL) BY MOUTH 2 (TWO) TIMES DAILY WITH A MEAL.   cyanocobalamin (VITAMIN B12) 1000 MCG tablet Take 1 tablet (1,000 mcg total) by mouth daily.   fenofibrate (TRICOR) 145 MG tablet TAKE 1 TABLET BY MOUTH EVERY DAY   Ferric Maltol (ACCRUFER) 30 MG CAPS Take 1 capsule by mouth in the morning and at bedtime.   folic acid (FOLVITE) 1 MG tablet Take 1 tablet (1 mg total) by mouth daily.   furosemide (LASIX) 20 MG tablet Take 1 tablet (20 mg total) by mouth daily.   gabapentin (NEURONTIN) 100 MG capsule Take 1 capsule (100 mg total) by mouth 2 (two) times daily. (Patient taking differently: Take 100 mg by mouth 2 (two) times daily. One in the am and 2 pills in PM)   Garlic Oil 3875 MG CAPS Take 1,000 mg by mouth daily.   Lactobacillus (PROBIOTIC ACIDOPHILUS) CAPS Take 1 tablet by mouth daily.   loratadine (CLARITIN) 10 MG tablet TAKE 1 TABLET BY MOUTH EVERY DAY   magnesium oxide (MAG-OX) 400 (240 Mg) MG tablet TAKE 1 TABLET BY MOUTH TWICE A DAY   metoprolol succinate (TOPROL-XL) 50 MG 24 hr tablet TAKE 1 TABLET BY MOUTH TWICE A DAY (Patient taking differently: Take 50 mg by mouth daily.)   mometasone (NASONEX) 50 MCG/ACT nasal spray PLACE 2 SPRAYS INTO THE NOSE DAILY.   omeprazole (PRILOSEC) 20 MG capsule TAKE 1 CAPSULE BY MOUTH EVERY DAY   primidone (MYSOLINE) 50 MG tablet Take 0.5 tablets (25 mg total) by mouth at bedtime.   raloxifene (EVISTA) 60 MG tablet TAKE 1  TABLET BY MOUTH EVERY DAY   SPIRIVA RESPIMAT 2.5 MCG/ACT AERS INHALE 2 PUFFS BY MOUTH PER DAY   triamcinolone cream (KENALOG) 0.1 % Apply 1 application topically 2 (two) times daily. (Patient taking differently: Apply 1 application  topically daily as needed (yeast infection under breast).)   zinc gluconate 50 MG tablet Take 1 tablet (50 mg total) by mouth daily.   Cyanocobalamin (B-12 COMPLIANCE INJECTION IJ) Inject as directed. One time a month (Patient not taking: Reported on 07/04/2022)   Facility-Administered Encounter Medications as of 08/22/2022  Medication   cyanocobalamin (VITAMIN B12) injection 1,000 mcg    PAST MEDICAL HISTORY: Past Medical History:  Diagnosis Date   Allergy    Anxiety    Arthritis    Cancer ([place])    hx of melanoma    Colon polyps  COPD (chronic obstructive pulmonary disease) (HCC)    Coronary artery disease    Dyspnea    with exertion    Edema    in lower extremties    Emphysema of lung (HCC)    GERD (gastroesophageal reflux disease)    H/O hiatal hernia    Hyperlipidemia    Hypertension    Inverted nipple    bilateral, pt says that's normal   Myocardial infarct, old 2008   no stent placed   Osteoporosis    Pneumonia    hx of    Sepsis ([place])    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    Stroke [facility])    TIA    PAST SURGICAL HISTORY: Past Surgical History:  Procedure Laterality Date   [place]  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   HERNIA REPAIR     umbilical, ventral   INCISIONAL HERNIA REPAIR N/A 10/26/2020   Procedure: DIAGNOSTIC LAPAROSCOPY MESH EXPLANTATION; SMALL BOWEL RESECTION; PRIMARY REPAIR OF INCISIONAL HERNIA;  Surgeon: Kinsinger, [name], MD;  Location: [facility];  Service: General;  Laterality: N/A;   INSERTION OF ILIAC STENT  2008    ALLERGIES: Allergies  Allergen Reactions   Prednisone Other (See Comments)    Leg pain,light headed    Statins     Myalgias- per patient, she feels better off the medication    FAMILY HISTORY: Family History  Problem Relation Age of Onset   Hearing loss Father    Hearing loss Maternal Aunt    Hearing loss Maternal Grandmother    Cancer Maternal Grandmother        colon   Arthritis Mother    Cancer Paternal Grandmother        colon   Breast cancer Neg Hx     SOCIAL HISTORY: Social History   Tobacco Use   Smoking status: Former    Packs/day: 1.50    Years: 44.00    Total pack years: 66.00    Types: Cigarettes    Quit date: 07/28/2005    Years since quitting: 17.0   Smokeless tobacco: Never  Vaping Use   Vaping Use: Never used  Substance Use Topics   Alcohol use: Yes    Alcohol/week: 2.0 standard drinks of alcohol    Types: 2 Shots of liquor per week    Comment: rarely   Drug use: No   Social History   Social History Narrative   Right handed   Caffeine 64 0z a day   Home is one story    Lives with daughter   Retired.      Objective:  Vital Signs:  BP (!) 113/55   Pulse 82   Ht 5' (1.524 m)   Wt 144 lb (65.3 kg)   SpO2 95%   BMI 28.12 kg/m   General: No acute distress.   Head:  Normocephalic/atraumatic Eyes:  Fundi examined but not visualized Neck: supple Lungs:  Non-labored breathing on room air. Neurological Exam: alert and oriented.  Speech fluent and not dysarthric, language intact.  CN II-XII intact. Bulk and tone normal, muscle strength 5/5 throughout, except 5- in bilateral hip flexors.  Sensation to light touch intact. Deep tendon reflexes 2+ throughout.  Finger to nose testing intact. Very mild tremor appreciated in left hand today, improved from prior. Ambulates with walker today.   Labs and Imaging review: New results: Normal or unremarkable: copper, vit E,  B12 (813), IFE, kappa/lambda light chains, zinc   MRI cervical spine wo contrast (05/24/22): FINDINGS: Alignment: Straightening of cervical lordosis.   Vertebrae: No fracture,  evidence of discitis, or bone lesion.   Cord: Normal signal and morphology.   Posterior Fossa, vertebral arteries, paraspinal tissues: Trace dependent pleural fluid on the right. No flow void seen within the left vertebral artery, the non dominant side.   Disc levels:   C2-3: Mild facet spurring with ligamentum flavum thickening. No neural compression   C3-4: Mild facet spurring with tiny central protrusion   C4-5: Degenerative facet spurring and mild disc space narrowing   C5-6: Disc narrowing and bulging with endplate ridging. Mild facet spurring.   C6-7: Disc space narrowing and bulging with mild endplate ridging. Patent canal and foramina   C7-T1:Unremarkable.   IMPRESSION: 1. Ordinary cervical spine degeneration as described. Diffusely patent canal and foramina. Normal cord signal. 2. Slow or absent flow in the non dominant left vertebral artery.   Lumbar xray (07/09/22): FINDINGS: Vertebral body height and alignment are maintained. Intervertebral disc space height is normal. Facet degenerative disease in the mid and lower lumbar spine appears worst at L4-5 and L5-S1 unchanged. Paraspinous structures again demonstrate extensive atherosclerosis.   IMPRESSION: No acute abnormality.   Lower lumbar facet arthropathy appears unchanged.   Aortic Atherosclerosis (ICD10-I70.0).    Previously reviewed results:          Lab Results  Component Value Date    HGBA1C 5.7 08/21/2021      Recent Labs           Lab Results  Component Value Date    VITAMINB12 163 (L) 02/26/2022      Recent Labs           Lab Results  Component Value Date    TSH 2.60 02/21/2022        Normal or unremarkable: B1, BMP Zinc low at 51 Folate low at 4.4 Iron low at 22 CBC significant for thrombocytosis (560)   Imaging: CT cervical spine wo contrast (08/31/19): FINDINGS: Alignment: No significant listhesis.   Skull base and vertebrae: No acute fracture. Vertebral body  heights are maintained apart from degenerative endplate irregularity at C5-C6 to C7-T1. No destructive osseous lesion.   Soft tissues and spinal canal: No prevertebral fluid or swelling. No visible canal hematoma.   Disc levels: Degenerative changes are present primarily at C5-C6 and C6-C7. There is no high-grade osseous encroachment on the spinal canal.   Upper chest: Emphysema.   Other: None.   IMPRESSION: No acute cervical spine fracture.   CT head wo contrast (08/31/19): FINDINGS: Brain: There is no acute intracranial hemorrhage, mass-effect, or edema. Gray-white differentiation is preserved. There is no extra-axial fluid collection. Patchy hypoattenuation in the supratentorial white matter is nonspecific but may reflect mild to moderate chronic microvascular ischemic changes. Ventricles and sulci are within normal limits in size and configuration for patient age.   Vascular: There is atherosclerotic calcification at the skull base.   Skull: Calvarium is unremarkable.   Sinuses/Orbits: Minor mucosal thickening. Evidence of chronic right maxillary sinus inflammation. Orbits are unremarkable.   Other: None.   IMPRESSION: No evidence of acute intracranial injury. Chronic/nonemergent findings detailed above.   Lumbar spine xray (09/01/17): FINDINGS: Six non-rib-bearing lumbar type segments.   Bones appear mildly demineralized.   Vertebral body and disc space heights maintained.   Facet degenerative changes lower lumbar spine.   Scattered mild endplate spur formation.   No acute fracture,  subluxation or bone destruction.   No spondylolysis.   Extensive atherosclerotic calcifications of the aorta and iliac arteries with stents identified at the RIGHT common and external iliac arteries.   SI joints preserved.   IMPRESSION: Degenerative changes of the lumbar spine.   No acute abnormalities.  Assessment/Plan:  This is Courtney Brown, a 82 y.o. female with  essential tremor. She has improved with primidone but does still have some jerking that sounds like myoclonic jerks. I did not see evidence of myoclonic jerks today in clinic and tremor is minimal, only noticed on finger to nose testing. Again, there is no evidence of parkinsonism on exam today. Patient is walking with a walker today due to feeling weak and fatigued from anemia per her report   Plan: -Increase primidone to 50 mg daily -Discussed that Gabapentin that she takes for back pain can cause jerking, but jerking started prior to recent increase -Patient wanted to start PT - she is interested in home PT because it is hard for her to get out  Return to clinic in 6 months  Total time spent reviewing records, interview, history/exam, documentation, and coordination of care on day of encounter:  20 min  Kai Levins, MD

## 2022-08-14 ENCOUNTER — Other Ambulatory Visit: Payer: Self-pay

## 2022-08-14 DIAGNOSIS — D539 Nutritional anemia, unspecified: Secondary | ICD-10-CM

## 2022-08-15 ENCOUNTER — Encounter: Payer: Self-pay | Admitting: Oncology

## 2022-08-15 ENCOUNTER — Inpatient Hospital Stay (HOSPITAL_BASED_OUTPATIENT_CLINIC_OR_DEPARTMENT_OTHER): Payer: 59 | Admitting: Oncology

## 2022-08-15 ENCOUNTER — Other Ambulatory Visit: Payer: Medicare Other

## 2022-08-15 ENCOUNTER — Ambulatory Visit: Payer: Medicare Other | Admitting: Oncology

## 2022-08-15 ENCOUNTER — Inpatient Hospital Stay: Payer: 59 | Attending: Oncology

## 2022-08-15 VITALS — BP 136/62 | HR 84 | Temp 97.0°F | Resp 18 | Wt 143.0 lb

## 2022-08-15 DIAGNOSIS — D510 Vitamin B12 deficiency anemia due to intrinsic factor deficiency: Secondary | ICD-10-CM

## 2022-08-15 DIAGNOSIS — D520 Dietary folate deficiency anemia: Secondary | ICD-10-CM | POA: Diagnosis not present

## 2022-08-15 DIAGNOSIS — D649 Anemia, unspecified: Secondary | ICD-10-CM | POA: Insufficient documentation

## 2022-08-15 DIAGNOSIS — D539 Nutritional anemia, unspecified: Secondary | ICD-10-CM

## 2022-08-15 DIAGNOSIS — N1832 Chronic kidney disease, stage 3b: Secondary | ICD-10-CM | POA: Diagnosis not present

## 2022-08-15 DIAGNOSIS — D508 Other iron deficiency anemias: Secondary | ICD-10-CM

## 2022-08-15 DIAGNOSIS — Z79899 Other long term (current) drug therapy: Secondary | ICD-10-CM | POA: Insufficient documentation

## 2022-08-15 LAB — CMP (CANCER CENTER ONLY)
ALT: 7 U/L (ref 0–44)
AST: 13 U/L — ABNORMAL LOW (ref 15–41)
Albumin: 2.9 g/dL — ABNORMAL LOW (ref 3.5–5.0)
Alkaline Phosphatase: 52 U/L (ref 38–126)
Anion gap: 4 — ABNORMAL LOW (ref 5–15)
BUN: 17 mg/dL (ref 8–23)
CO2: 31 mmol/L (ref 22–32)
Calcium: 10.5 mg/dL — ABNORMAL HIGH (ref 8.9–10.3)
Chloride: 101 mmol/L (ref 98–111)
Creatinine: 0.89 mg/dL (ref 0.44–1.00)
GFR, Estimated: >60 mL/min (ref >60)
Glucose, Bld: 107 mg/dL — ABNORMAL HIGH (ref 70–99)
Potassium: 4.1 mmol/L (ref 3.5–5.1)
Sodium: 136 mmol/L (ref 135–145)
Total Bilirubin: 0.4 mg/dL (ref 0.3–1.2)
Total Protein: 6.3 g/dL — ABNORMAL LOW (ref 6.5–8.1)

## 2022-08-15 LAB — CBC WITH DIFFERENTIAL (CANCER CENTER ONLY)
Abs Immature Granulocytes: 0.08 10*3/uL — ABNORMAL HIGH (ref 0.00–0.07)
Basophils Absolute: 0.1 10*3/uL (ref 0.0–0.1)
Basophils Relative: 0 %
Eosinophils Absolute: 0.2 10*3/uL (ref 0.0–0.5)
Eosinophils Relative: 1 %
HCT: 35.4 % — ABNORMAL LOW (ref 36.0–46.0)
Hemoglobin: 11.2 g/dL — ABNORMAL LOW (ref 12.0–15.0)
Immature Granulocytes: 1 %
Lymphocytes Relative: 9 %
Lymphs Abs: 1.6 10*3/uL (ref 0.7–4.0)
MCH: 25.7 pg — ABNORMAL LOW (ref 26.0–34.0)
MCHC: 31.6 g/dL (ref 30.0–36.0)
MCV: 81.4 fL (ref 80.0–100.0)
Monocytes Absolute: 1.7 10*3/uL — ABNORMAL HIGH (ref 0.1–1.0)
Monocytes Relative: 10 %
Neutro Abs: 13.7 10*3/uL — ABNORMAL HIGH (ref 1.7–7.7)
Neutrophils Relative %: 79 %
Platelet Count: 604 10*3/uL — ABNORMAL HIGH (ref 150–400)
RBC: 4.35 MIL/uL (ref 3.87–5.11)
RDW: 19.3 % — ABNORMAL HIGH (ref 11.5–15.5)
WBC Count: 17.3 10*3/uL — ABNORMAL HIGH (ref 4.0–10.5)
nRBC: 0 % (ref 0.0–0.2)

## 2022-08-15 LAB — IRON AND IRON BINDING CAPACITY (CC-WL,HP ONLY)
Iron: 17 ug/dL — ABNORMAL LOW (ref 28–170)
Saturation Ratios: 6 % — ABNORMAL LOW (ref 10.4–31.8)
TIBC: 286 ug/dL (ref 250–450)
UIBC: 269 ug/dL (ref 148–442)

## 2022-08-15 LAB — FERRITIN: Ferritin: 391 ng/mL — ABNORMAL HIGH (ref 11–307)

## 2022-08-15 MED ORDER — VITAMIN B-12 1000 MCG PO TABS: 1000.0000 ug | ORAL_TABLET | Freq: Every day | ORAL | 3 refills | Status: DC

## 2022-08-15 NOTE — Patient Instructions (Signed)
Please start Vitamin B12 1000 micrograms by mouth daily This is available over the counter

## 2022-08-15 NOTE — Progress Notes (Signed)
[facility] Follow up Visit:  Patient Care Team: Janith Lima, MD as PCP - General (Internal Medicine) Lorretta Harp, MD as Consulting Physician (Cardiology) Barbee Cough, MD as Attending Physician (Hematology)  CHIEF COMPLAINTS/PURPOSE OF CONSULTATION:  Oncology History   No history exists.    HISTORY OF PRESENTING ILLNESS: Courtney Brown 82 y.o. female is here because of anemia Medical history notable for arthritis, history of melanoma, colon polyps, COPD, coronary artery disease, GERD, osteoporosis, hypertension, CVA, acute kidney injury Colonoscopy done 05/26/2014 by [facility] in [place], [place]: colon polyps removed. Recommended to repeat in 5years. EGD done 05/26/2014 by [facility]: mild gastritis.   February 26, 2022 ferritin 5.4 B12 163 zinc 51  June 04, 2022 WBC 14.7 hemoglobin 10.9 MCV 76 platelet count 637; 76 segs 10 lymphs 11 monos 2 eos 1 basophil. Ferritin 7.1  July 04 2022:  [facility] In August 2023 patient was placed on oral iron, zinc, B12 injections.  She stopped taking oral iron for a time because it was costing too much.  She is now taking low dose oral iron with T61 and folic acid as well as zinc.  She takes the supplements bid on an empty stomach.  Has some diarrhea which may be related to her medications.  Received PRBC's in 2015 in the setting of sepsis.  No history of IV iron.  No hematochezia, melena, BRBPR.  Stools dark from oral iron.  Has not had endoscopy since 2015.  Takes ibuprofen for pain and is on ASA 81 mg daily  Social:  Widowed.  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  Quit smoking 16 yrs ago.  EtOH very seldom.    [facility] Mother died 32 fall Father died 60's dementia, parkinsons Brother x 3.  All have had Parkinson's disease.  One alive.  Two died;  one had prostate cancer      WBC 15.1 hemoglobin 10.7 MCV 79 platelet count 650; 79 segs 8 lymphs 10 monos  eos cell count 1.7%  Morphology notable for polychromasia Coombs negative haptoglobin 319 SPEP with IEP showed no paraprotein free kappa 70.7 lambda 52.3 with a kappa lambda 1.35 IgG 1089 IgA 370 IgM 90 Ferritin 115 Copper 111 B12 813 zinc 94 CMP notable for albumin 2.9 AST 13  July 11, 2022: Feraheme 510 mg IV  August 04, 2022: Patient message from [facility] to schedule an appointment  August 15, 2022.  Scheduled follow-up for management of anemia.  She is taking Nature's bounty gentle iron 28 mg daily which she is tolerating well. Patient will schedule GI appointment once daughter recovers from her dental surgery.     WBC 17.3 hemoglobin 11.2 MCV 81 platelet count 604; 79 segs 9 lymphs 10 monos 1 EO Ferritin 391 CMP notable for albumin 2.9  Calcium 10.5  Patient can take B12 by mouth   Review of Systems  Constitutional:  Negative for appetite change, chills, fatigue, fever and unexpected weight change.  HENT:   Positive for hearing loss. Negative for mouth sores, nosebleeds, sore throat, trouble swallowing and voice change.   Eyes:  Positive for eye problems. Negative for icterus.       Has cataracts which interfere with vision as a result uses a walker  Respiratory:  Positive for shortness of breath. Negative for chest tightness, cough, hemoptysis and wheezing.        PND:  none Orthopnea:  none DOE:    Cardiovascular:  Negative  for chest pain, leg swelling and palpitations.       PND:  none Orthopnea:  none  Gastrointestinal:  Negative for abdominal pain, blood in stool, constipation, diarrhea, nausea and vomiting.  Endocrine: Negative for hot flashes.       Cold intolerance:  none Heat intolerance:  none  Genitourinary:  Negative for bladder incontinence, difficulty urinating, dysuria, frequency, hematuria and nocturia.   Musculoskeletal:  Positive for arthralgias, back pain and gait problem. Negative for myalgias, neck pain and neck stiffness.       Has chronic  back pain  Skin:  Negative for itching, rash and wound.  Neurological:  Positive for extremity weakness and gait problem. Negative for dizziness, headaches, light-headedness, numbness, seizures and speech difficulty.  Hematological:  Negative for adenopathy. Bruises/bleeds easily.  Psychiatric/Behavioral:  Negative for sleep disturbance and suicidal ideas. The patient is not nervous/anxious.     MEDICAL HISTORY: Past Medical History:  Diagnosis Date   Allergy    Anxiety    Arthritis    Cancer ([place])    hx of melanoma    Colon polyps    COPD (chronic obstructive pulmonary disease) ([place])    Coronary artery disease    Dyspnea    with exertion    Edema    in lower extremties    Emphysema of lung ([facility])    GERD (gastroesophageal reflux disease)    H/O hiatal hernia    Hyperlipidemia    Hypertension    Inverted nipple    bilateral, pt says that's normal   Myocardial infarct, old 2008   no stent placed   Osteoporosis    Pneumonia    hx of    Sepsis ([place])    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    Stroke [facility])    TIA    SURGICAL HISTORY: Past Surgical History:  Procedure Laterality Date   ABDOMINAL HYSTERECTOMY  1998   CHOLECYSTECTOMY  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   HERNIA REPAIR     umbilical, ventral   INCISIONAL HERNIA REPAIR N/A 10/26/2020   Procedure: DIAGNOSTIC LAPAROSCOPY MESH EXPLANTATION; SMALL BOWEL RESECTION; PRIMARY REPAIR OF INCISIONAL HERNIA;  Surgeon: Kieth Brightly, [name], MD;  Location: [facility];  Service: General;  Laterality: N/A;   INSERTION OF ILIAC STENT  2008    SOCIAL HISTORY: Social History   Socioeconomic History   Marital status: Widowed    Spouse name: Not on file   Number of children: Not on file   Years of education: Not on file   Highest education level: Not on file  Occupational History   Not on file  Tobacco Use   Smoking status: Former    Packs/day: 1.50    Years: 44.00     Total pack years: 66.00    Types: Cigarettes    Quit date: 07/28/2005    Years since quitting: 17.0   Smokeless tobacco: Never  Vaping Use   Vaping Use: Never used  Substance and Sexual Activity   Alcohol use: Yes    Alcohol/week: 2.0 standard drinks of alcohol    Types: 2 Shots of liquor per week    Comment: rarely   Drug use: No   Sexual activity: Not Currently  Other Topics Concern   Not on file  Social History Narrative   Right handed   Caffeine 64 0z a day   Home is one story    Social Determinants of Health  Financial Resource Strain: Not on file  Food Insecurity: Not on file  Transportation Needs: Not on file  Physical Activity: Not on file  Stress: Not on file  Social Connections: Not on file  Intimate Partner Violence: Not on file    FAMILY HISTORY Family History  Problem Relation Age of Onset   Hearing loss Father    Hearing loss Maternal Aunt    Hearing loss Maternal Grandmother    Cancer Maternal Grandmother        colon   Arthritis Mother    Cancer Paternal Grandmother        colon   Breast cancer Neg Hx     ALLERGIES:  is allergic to prednisone and statins.  MEDICATIONS:  Current Outpatient Medications  Medication Sig Dispense Refill   acetaminophen (TYLENOL) 500 MG tablet Take 500 mg by mouth every 6 (six) hours as needed for mild pain or moderate pain.     albuterol (VENTOLIN HFA) 108 (90 Base) MCG/ACT inhaler INHALE 1-2 PUFFS BY MOUTH EVERY 6 HOURS AS NEEDED FOR WHEEZE OR SHORTNESS OF BREATH 6.7 each 1   ALPRAZolam (XANAX) 0.25 MG tablet Take 1 tablet (0.25 mg total) by mouth 2 (two) times daily as needed for anxiety. 30 tablet 4   aspirin 81 MG tablet Take 81 mg by mouth at bedtime.     budesonide-formoterol (SYMBICORT) 160-4.5 MCG/ACT inhaler TAKE 2 PUFFS BY MOUTH TWICE A DAY 30.6 each 1   Calcium Citrate-Vitamin D (CITRACAL + D PO) Take 1 tablet by mouth 2 (two) times daily. 600 mg calcium     colesevelam (WELCHOL) 625 MG tablet TAKE 1  TABLET (625 MG TOTAL) BY MOUTH 2 (TWO) TIMES DAILY WITH A MEAL. 180 tablet 1   Cyanocobalamin (B-12 COMPLIANCE INJECTION IJ) Inject as directed. One time a month (Patient not taking: Reported on 07/04/2022)     fenofibrate (TRICOR) 145 MG tablet TAKE 1 TABLET BY MOUTH EVERY DAY 90 tablet 1   Ferric Maltol (ACCRUFER) 30 MG CAPS Take 1 capsule by mouth in the morning and at bedtime. (Patient not taking: Reported on 07/04/2022) 062 capsule 0   folic acid (FOLVITE) 1 MG tablet Take 1 tablet (1 mg total) by mouth daily. 90 tablet 1   furosemide (LASIX) 20 MG tablet Take 1 tablet (20 mg total) by mouth daily. 90 tablet 1   gabapentin (NEURONTIN) 100 MG capsule Take 1 capsule (100 mg total) by mouth 2 (two) times daily. 694 capsule 1   Garlic Oil 8546 MG CAPS Take 1,000 mg by mouth daily. (Patient not taking: Reported on 05/07/2022)     Lactobacillus (PROBIOTIC ACIDOPHILUS) CAPS Take 1 tablet by mouth daily.     loratadine (CLARITIN) 10 MG tablet TAKE 1 TABLET BY MOUTH EVERY DAY 90 tablet 1   magnesium oxide (MAG-OX) 400 (240 Mg) MG tablet TAKE 1 TABLET BY MOUTH TWICE A DAY 60 tablet 2   metoprolol succinate (TOPROL-XL) 50 MG 24 hr tablet TAKE 1 TABLET BY MOUTH TWICE A DAY 180 tablet 1   mometasone (NASONEX) 50 MCG/ACT nasal spray PLACE 2 SPRAYS INTO THE NOSE DAILY. 51 each 3   omeprazole (PRILOSEC) 20 MG capsule TAKE 1 CAPSULE BY MOUTH EVERY DAY 90 capsule 1   primidone (MYSOLINE) 50 MG tablet Take 0.5 tablets (25 mg total) by mouth at bedtime. 45 tablet 1   raloxifene (EVISTA) 60 MG tablet TAKE 1 TABLET BY MOUTH EVERY DAY 90 tablet 1   SPIRIVA RESPIMAT 2.5 MCG/ACT AERS INHALE  2 PUFFS BY MOUTH PER DAY 12 g 1   triamcinolone cream (KENALOG) 0.1 % Apply 1 application topically 2 (two) times daily. (Patient taking differently: Apply 1 application  topically daily as needed (yeast infection under breast).) 30 g 0   zinc gluconate 50 MG tablet Take 1 tablet (50 mg total) by mouth daily. 90 tablet 1   Current  Facility-Administered Medications  Medication Dose Route Frequency Provider Last Rate Last Admin   cyanocobalamin (VITAMIN B12) injection 1,000 mcg  1,000 mcg Intramuscular Q30 days Janith Lima, MD   1,000 mcg at 03/24/22 1517    PHYSICAL EXAMINATION:  ECOG PERFORMANCE STATUS: 1 - Symptomatic but completely ambulatory   There were no vitals filed for this visit.   There were no vitals filed for this visit.    Physical Exam Vitals and nursing note reviewed.  Constitutional:      Appearance: Normal appearance. She is not toxic-appearing or diaphoretic.     Comments: Chronically ill appearing.  Has a walker  HENT:     Head: Normocephalic and atraumatic.     Right Ear: External ear normal.     Left Ear: External ear normal.     Nose: Nose normal. No congestion or rhinorrhea.  Eyes:     General: No scleral icterus.    Extraocular Movements: Extraocular movements intact.     Conjunctiva/sclera: Conjunctivae normal.     Pupils: Pupils are equal, round, and reactive to light.  Cardiovascular:     Rate and Rhythm: Normal rate.     Heart sounds: No murmur heard.    No friction rub. No gallop.  Pulmonary:     Effort: Pulmonary effort is normal. No respiratory distress.     Breath sounds: Normal breath sounds. No stridor. No wheezing or rhonchi.  Abdominal:     General: Bowel sounds are normal. There is no distension.     Palpations: Abdomen is soft.     Tenderness: There is no abdominal tenderness. There is no guarding or rebound.     Hernia: No hernia is present.  Musculoskeletal:        General: No swelling, tenderness or deformity.     Cervical back: Normal range of motion and neck supple. No rigidity or tenderness.     Right lower leg: No edema.     Left lower leg: No edema.  Lymphadenopathy:     Head:     Right side of head: No submental, submandibular, tonsillar, preauricular, posterior auricular or occipital adenopathy.     Left side of head: No submental,  submandibular, tonsillar, preauricular, posterior auricular or occipital adenopathy.     Cervical: No cervical adenopathy.     Right cervical: No superficial, deep or posterior cervical adenopathy.    Left cervical: No superficial, deep or posterior cervical adenopathy.     Upper Body:     Right upper body: No supraclavicular, axillary, pectoral or epitrochlear adenopathy.     Left upper body: No supraclavicular, axillary, pectoral or epitrochlear adenopathy.  Skin:    General: Skin is warm.     Coloration: Skin is not jaundiced or pale.     Findings: No bruising or erythema.  Neurological:     General: No focal deficit present.     Mental Status: She is alert and oriented to person, place, and time. Mental status is at baseline.     Cranial Nerves: No cranial nerve deficit.     Gait: Gait abnormal.  Psychiatric:  Mood and Affect: Mood normal.        Behavior: Behavior normal.        Thought Content: Thought content normal.        Judgment: Judgment normal.     LABORATORY DATA: I have personally reviewed the data as listed:  No visits with results within 1 Month(s) from this visit.  Latest known visit with results is:  Appointment on 07/04/2022  Component Date Value Ref Range Status   Kappa free light chain 07/04/2022 70.7 (H)  3.3 - 19.4 mg/L Final   Lambda free light chains 07/04/2022 52.3 (H)  5.7 - 26.3 mg/L Final   Kappa, lambda light chain ratio 07/04/2022 1.35  0.26 - 1.65 Final   Comment: (NOTE) Performed At: [facility] [place], [place] [id number] Rush Farmer MD UD:[id number]    IgG (Immunoglobin G), Serum 07/04/2022 1,089  586 - 1,602 mg/dL Final   IgA 07/04/2022 370  64 - 422 mg/dL Final   IgM (Immunoglobulin M), Srm 07/04/2022 90  26 - 217 mg/dL Final   Total Protein ELP 07/04/2022 5.9 (L)  6.0 - 8.5 g/dL Corrected   Albumin SerPl Elph-Mcnc 07/04/2022 2.3 (L)  2.9 - 4.4 g/dL Corrected   Alpha 1 07/04/2022 0.5 (H)  0.0 - 0.4 g/dL  Corrected   Alpha2 Glob SerPl Elph-Mcnc 07/04/2022 0.9  0.4 - 1.0 g/dL Corrected   B-Globulin SerPl Elph-Mcnc 07/04/2022 1.2  0.7 - 1.3 g/dL Corrected   Gamma Glob SerPl Elph-Mcnc 07/04/2022 1.0  0.4 - 1.8 g/dL Corrected   M Protein SerPl Elph-Mcnc 07/04/2022 Not Observed  Not Observed g/dL Corrected   Globulin, Total 07/04/2022 3.6  2.2 - 3.9 g/dL Corrected   Albumin/Glob SerPl 07/04/2022 0.7  0.7 - 1.7 Corrected   IFE 1 07/04/2022 Comment   Corrected   Comment: (NOTE) The immunofixation pattern appears unremarkable. Evidence of monoclonal protein is not apparent.    Please Note 07/04/2022 Comment   Corrected   Comment: (NOTE) Protein electrophoresis scan will follow via computer, mail, or courier delivery. Performed At: [facility] [place], [place] [id number] Rush Farmer MD [id number]    Retic Ct Pct 07/04/2022 1.7  0.4 - 3.1 % Final   RBC. 07/04/2022 4.45  3.87 - 5.11 MIL/uL Final   Retic Count, Absolute 07/04/2022 73.9  19.0 - 186.0 K/uL Final   Immature Retic Fract 07/04/2022 21.9 (H)  2.3 - 15.9 % Final   Performed at [facility], [place] [address], [place], [place] [address]   Vitamin B-12 07/04/2022 813  180 - 914 pg/mL Final   Comment: (NOTE) This assay is not validated for testing neonatal or myeloproliferative syndrome specimens for Vitamin B12 levels. Performed at [facility], [place] [address], [place], [place] [address]    Zinc 07/04/2022 94  44 - 115 ug/dL Final   Comment: (NOTE) This test was developed and its performance characteristics determined by [facility]. It has not been cleared or approved by the Food and Drug Administration.                                Detection Limit = 5 Performed At: [facility] [address] [place], [place] [id number] Rush Farmer MD [id number]    Haptoglobin 07/04/2022 319  42 - 346 mg/dL Final   Comment: (NOTE) Performed At: [facility] [address] [place], [place] [id number] Perlie Gold  Derinda Late MD OZ:[id number]    Folate 07/04/2022 >40.0  >5.9 ng/mL Final   Comment: RESULT CONFIRMED BY MANUAL DILUTION Performed at [facility] [address], [place], [place] [address]    DAT, complement 07/04/2022 NEG   Final   DAT, IgG 07/04/2022    Final                   Value:NEG Performed at [facility], [place] [address], [place], [place] [address]    Ferritin 07/04/2022 150  11 - 307 ng/mL Final   Performed at [facility], [address], [place], [place] [address]   Copper 07/04/2022 111  80 - 158 ug/dL Final   Comment: (NOTE) This test was developed and its performance characteristics determined by [facility]. It has not been cleared or approved by the Food and Drug Administration.                                Detection Limit = 5 Performed At: [facility] [place], [place] [id number] Rush Farmer MD YT:[id number]    Sodium 07/04/2022 140  135 - 145 mmol/L Final   Potassium 07/04/2022 3.8  3.5 - 5.1 mmol/L Final   Chloride 07/04/2022 104  98 - 111 mmol/L Final   CO2 07/04/2022 31  22 - 32 mmol/L Final   Glucose, Bld 07/04/2022 91  70 - 99 mg/dL Final   Glucose reference range applies only to samples taken after fasting for at least 8 hours.   BUN 07/04/2022 13  8 - 23 mg/dL Final   Creatinine 07/04/2022 0.69  0.44 - 1.00 mg/dL Final   Calcium 07/04/2022 9.7  8.9 - 10.3 mg/dL Final   Total Protein 07/04/2022 6.4 (L)  6.5 - 8.1 g/dL Final   Albumin 07/04/2022 2.9 (L)  3.5 - 5.0 g/dL Final   AST 07/04/2022 13 (L)  15 - 41 U/L Final   ALT 07/04/2022 5  0 - 44 U/L Final   Alkaline Phosphatase 07/04/2022 47  38 - 126 U/L Final   Total Bilirubin 07/04/2022 0.3  0.3 - 1.2 mg/dL Final   GFR, Estimated 07/04/2022 >60  >60 mL/min Final   Comment: (NOTE) Calculated using the CKD-EPI Creatinine Equation (2021)    Anion gap 07/04/2022 5   5 - 15 Final   Performed at [facility], [place] [address] [place], [place] [address]   WBC Count 07/04/2022 15.1 (H)  4.0 - 10.5 K/uL Final   RBC 07/04/2022 4.45  3.87 - 5.11 MIL/uL Final   Hemoglobin 07/04/2022 10.7 (L)  12.0 - 15.0 g/dL Final   Comment: Reticulocyte Hemoglobin testing may be clinically indicated, consider ordering this additional test [id number]    HCT 07/04/2022 35.0 (L)  36.0 - 46.0 % Final   MCV 07/04/2022 78.7 (L)  80.0 - 100.0 fL Final   MCH 07/04/2022 24.0 (L)  26.0 - 34.0 pg Final   MCHC 07/04/2022 30.6  30.0 - 36.0 g/dL Final   RDW 07/04/2022 17.5 (H)  11.5 - 15.5 % Final   Platelet Count 07/04/2022 650 (H)  150 - 400 K/uL Final   nRBC 07/04/2022 0.0  0.0 - 0.2 % Final   Neutrophils Relative % 07/04/2022 79  % Final   Neutro Abs 07/04/2022 11.8 (H)  1.7 - 7.7 K/uL Final   Lymphocytes Relative 07/04/2022 8  % Final   Lymphs  Abs 07/04/2022 1.3  0.7 - 4.0 K/uL Final   Monocytes Relative 07/04/2022 10  % Final   Monocytes Absolute 07/04/2022 1.5 (H)  0.1 - 1.0 K/uL Final   Eosinophils Relative 07/04/2022 2  % Final   Eosinophils Absolute 07/04/2022 0.4  0.0 - 0.5 K/uL Final   Basophils Relative 07/04/2022 0  % Final   Basophils Absolute 07/04/2022 0.0  0.0 - 0.1 K/uL Final   WBC Morphology 07/04/2022 MORPHOLOGY UNREMARKABLE   Final   Smear Review 07/04/2022 Normal platelet morphology   Final   Immature Granulocytes 07/04/2022 1  % Final   Abs Immature Granulocytes 07/04/2022 0.09 (H)  0.00 - 0.07 K/uL Final   Polychromasia 07/04/2022 PRESENT   Final   Performed at [facility], [place] [address] [place], [place] [address]    RADIOGRAPHIC STUDIES: I have personally reviewed the radiological images as listed and agree with the findings in the report  No results found.  ASSESSMENT/PLAN 82 y.o. female with medical history notable for arthritis, history of melanoma, colon polyps, COPD, coronary artery disease,  GERD, osteoporosis, hypertension, CVA, acute kidney injury.  Patient seen for evaluation and management of anemia  Anemia:  Likely multifactorial with major component being chronic GI blood loss.  Contributing factors are likely CKD, B12 deficiency.  Patient on chronic antiplatelet therapy with ASA.  Has history of colon polyps.  Will arrange for IV iron.  Currently receiving B12 injections July 11, 2022: Feraheme 510 mg IV  August 15 2022:  Hgb improved to 11.2.  Patient to schedule GI appointment.  May take oral Z60 and continue folic acid  Hypercalcemia:  No evidence of Myeloma.  Patient is taking Calcium with Vitamin D which may be the culprit.     Cancer Staging  No matching staging information was found for the patient.   No problem-specific Assessment & Plan notes found for this encounter.   No orders of the defined types were placed in this encounter.  31  minutes was spent in patient care.  This included time spent preparing to see the patient (e.g., review of tests), obtaining and/or reviewing separately obtained history, counseling and educating the patient/family/caregiver, ordering medications, tests, or procedures; documenting clinical information in the electronic or other health record, independently interpreting results and communicating results to the patient/family/caregiver as well as coordination of care.      All questions were answered. The patient knows to call the clinic with any problems, questions or concerns.  This note was electronically signed.    Barbee Cough, MD  08/15/2022 12:55 PM

## 2022-08-18 ENCOUNTER — Telehealth: Payer: Self-pay | Admitting: Oncology

## 2022-08-18 NOTE — Telephone Encounter (Signed)
Left patient a vm regarding upcoming appointments

## 2022-08-19 NOTE — Progress Notes (Unsigned)
Courtney Brown [place] [address] [place] Phone: [phone] Subjective:   IVilma Brown, am serving as a scribe for Dr. Hulan Saas.  I'm seeing this patient by the request  of:  Courtney Lima, MD  CC: Low back and right knee pain follow-up  CBJ:[id number]  07/09/2022 Patient does have some atrophy of the lower extremities and difficult to tell if it is secondary to the degenerative arthritis of the knee or the potential for some more lumbar spinal stenosis that could be contributing.  Given injection is wearing any responded well.  Discussed which activities to do and which ones to avoid.  Follow-up again in 6 to 8 weeks and could be candidate for viscosupplementation if needed.  X-rays pending to further evaluate the amount of arthritis that is contributing   Patient's history does seem to have more of a spinal stenosis noted.  Discussed icing regimen and home exercises, discussed which activities to do and which ones to avoid.  We discussed with patient about gabapentin and increasing the amount that she is doing at this moment very slowly.  Will get x-rays to further evaluate as well.  Follow-up again in 6 to 8 weeks     Updated 08/20/2021 Courtney Brown is a 82 y.o. female coming in with complaint of LBP and right knee pain. Continued pain in back and knee. Legs are weaker. Thinking this is from anemia. Having some setbacks in life.       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   HERNIA REPAIR     umbilical, ventral   INCISIONAL HERNIA REPAIR N/A 10/26/2020   Procedure: DIAGNOSTIC LAPAROSCOPY MESH EXPLANTATION; SMALL BOWEL RESECTION; PRIMARY REPAIR OF INCISIONAL HERNIA;  Surgeon: Kieth Brightly, [name], MD;  Location: [facility];  Service: General;  Laterality: N/A;   INSERTION OF ILIAC STENT  2008   Social History   Socioeconomic History   Marital status: Widowed    Spouse name: Not on file   Number of children: Not on file   Years of education: Not on file   Highest education level: Not on file  Occupational History   Not on file  Tobacco Use   Smoking status: Former    Packs/day: 1.50    Years: 44.00    Total pack years: 66.00    Types: Cigarettes    Quit date: 07/28/2005    Years since quitting: 70.0  Smokeless tobacco: Never  Vaping Use   Vaping Use: Never used  Substance and Sexual Activity   Alcohol use: Yes    Alcohol/week: 2.0 standard drinks of alcohol    Types: 2 Shots of liquor per week    Comment: rarely   Drug use: No   Sexual activity: Not Currently  Other Topics Concern   Not on file  Social History Narrative   Right handed   Caffeine 53 0z a day   Home is one story    Social Determinants of Radio broadcast assistant Strain: Not on file  Food Insecurity: Not on file  Transportation Needs: Not on file  Physical Activity: Not on file  Stress: Not on file  Social Connections: Not on file   Allergies  Allergen Reactions   Prednisone Other (See Comments)    Leg pain,light headed   Statins     Myalgias- per patient, she feels better off the medication   Family History  Problem Relation Age of Onset   Hearing loss Father     Hearing loss Maternal Aunt    Hearing loss Maternal Grandmother    Cancer Maternal Grandmother        colon   Arthritis Mother    Cancer Paternal Grandmother        colon   Breast cancer Neg Hx     Current Outpatient Medications (Endocrine & Metabolic):    raloxifene (EVISTA) 60 MG tablet, TAKE 1 TABLET BY MOUTH EVERY DAY   Current Outpatient Medications (Cardiovascular):    colesevelam (WELCHOL) 625 MG tablet, TAKE 1 TABLET (625 MG TOTAL) BY MOUTH 2 (TWO) TIMES DAILY WITH A MEAL.   fenofibrate (TRICOR) 145 MG tablet, TAKE 1 TABLET BY MOUTH EVERY DAY   furosemide (LASIX) 20 MG tablet, Take 1 tablet (20 mg total) by mouth daily.   metoprolol succinate (TOPROL-XL) 50 MG 24 hr tablet, TAKE 1 TABLET BY MOUTH TWICE A DAY   Current Outpatient Medications (Respiratory):    albuterol (VENTOLIN HFA) 108 (90 Base) MCG/ACT inhaler, INHALE 1-2 PUFFS BY MOUTH EVERY 6 HOURS AS NEEDED FOR WHEEZE OR SHORTNESS OF BREATH   budesonide-formoterol (SYMBICORT) 160-4.5 MCG/ACT inhaler, TAKE 2 PUFFS BY MOUTH TWICE A DAY   loratadine (CLARITIN) 10 MG tablet, TAKE 1 TABLET BY MOUTH EVERY DAY   mometasone (NASONEX) 50 MCG/ACT nasal spray, PLACE 2 SPRAYS INTO THE NOSE DAILY.   SPIRIVA RESPIMAT 2.5 MCG/ACT AERS, INHALE 2 PUFFS BY MOUTH PER DAY   Current Outpatient Medications (Analgesics):    acetaminophen (TYLENOL) 500 MG tablet, Take 500 mg by mouth every 6 (six) hours as needed for mild pain or moderate pain.   aspirin 81 MG tablet, Take 81 mg by mouth at bedtime.   Current Outpatient Medications (Hematological):    Cyanocobalamin (B-12 COMPLIANCE INJECTION IJ), Inject as directed. One time a month (Patient not taking: Reported on 07/04/2022)   cyanocobalamin (VITAMIN B12) 1000 MCG tablet, Take 1 tablet (1,000 mcg total) by mouth daily.   Ferric Maltol (ACCRUFER) 30 MG CAPS, Take 1 capsule by mouth in the morning and at bedtime.   folic acid (FOLVITE) 1 MG tablet, Take 1 tablet (1 mg total) by mouth  daily.  Current Facility-Administered Medications (Hematological):    cyanocobalamin (VITAMIN B12) injection 1,000 mcg  Current Outpatient Medications (Other):    ALPRAZolam (XANAX) 0.25 MG tablet, Take 1 tablet (0.25 mg total) by mouth 2 (two) times daily as needed for anxiety.   Calcium Citrate-Vitamin D (CITRACAL +  D PO), Take 1 tablet by mouth 2 (two) times daily. 600 mg calcium   gabapentin (NEURONTIN) 100 MG capsule, Take 1 capsule (100 mg total) by mouth 2 (two) times daily.   Garlic Oil 1740 MG CAPS, Take 1,000 mg by mouth daily.   Lactobacillus (PROBIOTIC ACIDOPHILUS) CAPS, Take 1 tablet by mouth daily.   magnesium oxide (MAG-OX) 400 (240 Mg) MG tablet, TAKE 1 TABLET BY MOUTH TWICE A DAY   omeprazole (PRILOSEC) 20 MG capsule, TAKE 1 CAPSULE BY MOUTH EVERY DAY   primidone (MYSOLINE) 50 MG tablet, Take 0.5 tablets (25 mg total) by mouth at bedtime.   triamcinolone cream (KENALOG) 0.1 %, Apply 1 application topically 2 (two) times daily. (Patient taking differently: Apply 1 application  topically daily as needed (yeast infection under breast).)   zinc gluconate 50 MG tablet, Take 1 tablet (50 mg total) by mouth daily.    Reviewed prior external information including notes and imaging from  primary care provider As well as notes that were available from care everywhere and other healthcare systems.  Past medical history, social, surgical and family history all reviewed in electronic medical record.  No pertanent information unless stated regarding to the chief complaint.   Review of Systems:  No headache, visual changes, nausea, vomiting, diarrhea, constipation, dizziness, abdominal pain, skin rash, fevers, chills, night sweats, weight loss, swollen lymph nodes,  joint swelling, chest pain, shortness of breath, mood changes. POSITIVE muscle aches, body aches, fatigue  Objective  Blood pressure 124/60, pulse 84, height 5' (1.524 m), weight 144 lb (65.3 kg), SpO2 92 %.   General: No  apparent distress alert and oriented x3 mood and affect normal, dressed appropriately.  HEENT: Pupils equal, extraocular movements intact  Respiratory: Patient's speak in full sentences and does not appear short of breath  Low back exam does have some loss of lordosis.  Poor core strength noted.  Patient is tender to palpation diffusely in the lumbar spine.  Tightness with FABER test.  Patient does have atrophy noted.  Pitting edema of the lower extremities noted with 1+ dorsalis pedis pulses.    Impression and Recommendations:    The above documentation has been reviewed and is accurate and complete Lyndal Pulley, DO

## 2022-08-20 ENCOUNTER — Ambulatory Visit (INDEPENDENT_AMBULATORY_CARE_PROVIDER_SITE_OTHER): Payer: 59 | Admitting: Family Medicine

## 2022-08-20 ENCOUNTER — Encounter: Payer: Self-pay | Admitting: Family Medicine

## 2022-08-20 VITALS — BP 124/60 | HR 84 | Ht 60.0 in | Wt 144.0 lb

## 2022-08-20 DIAGNOSIS — M48062 Spinal stenosis, lumbar region with neurogenic claudication: Secondary | ICD-10-CM | POA: Diagnosis not present

## 2022-08-20 DIAGNOSIS — M1711 Unilateral primary osteoarthritis, right knee: Secondary | ICD-10-CM | POA: Diagnosis not present

## 2022-08-20 NOTE — Assessment & Plan Note (Signed)
Concern mostly with the spinal stenosis in the back.  Patient is still describing it.  States that there is weakness as well as significant fatigue in her lower extremities.  Patient is concerned though more that this is secondary to the iron deficiency.  Patient is to be scheduling a colonoscopy and an EGD soon.  Patient also had elevation of calcium as well as white blood cells.  Encouraged her to continue to keep close attention to these lab values as well.  We discussed with patient about possible advanced imaging of the lower back and history of melanoma.  Patient wants to hold at this time and would like to consider home health physical therapy.  Patient is going to check with her insurance to see which 1 would be covered and we would put in referral if necessary.  Follow-up with me again follow-up again 6 to 8 weeks.  Total time reviewing patient's previous imaging and discussing with patient 33 minutes

## 2022-08-20 NOTE — Assessment & Plan Note (Signed)
Knee pain significantly better after the injection.  No other changes in management at this time.  Follow-up again in 6 to 8 weeks

## 2022-08-20 NOTE — Patient Instructions (Signed)
Check with insurance about home PT Get help with anemia See about colonoscopy and EGD See you again in 6 weeks

## 2022-08-21 ENCOUNTER — Telehealth: Payer: Self-pay | Admitting: Gastroenterology

## 2022-08-21 NOTE — Telephone Encounter (Signed)
Good afternoon Dr Lyndel Safe   Supervising MD 08/21/22 PM  We have received a referral for patient to be seen for anemia. With a possible egd and colon procedure.  Patient has records available for review in [facility]. Please review and advise on scheduling.  Thank you

## 2022-08-22 ENCOUNTER — Ambulatory Visit (INDEPENDENT_AMBULATORY_CARE_PROVIDER_SITE_OTHER): Payer: 59 | Admitting: Neurology

## 2022-08-22 ENCOUNTER — Encounter: Payer: Self-pay | Admitting: Neurology

## 2022-08-22 VITALS — BP 113/55 | HR 82 | Ht 60.0 in | Wt 144.0 lb

## 2022-08-22 DIAGNOSIS — M4802 Spinal stenosis, cervical region: Secondary | ICD-10-CM

## 2022-08-22 DIAGNOSIS — G8929 Other chronic pain: Secondary | ICD-10-CM | POA: Diagnosis not present

## 2022-08-22 DIAGNOSIS — R2681 Unsteadiness on feet: Secondary | ICD-10-CM | POA: Diagnosis not present

## 2022-08-22 DIAGNOSIS — G250 Essential tremor: Secondary | ICD-10-CM

## 2022-08-22 DIAGNOSIS — R29898 Other symptoms and signs involving the musculoskeletal system: Secondary | ICD-10-CM

## 2022-08-22 DIAGNOSIS — M5441 Lumbago with sciatica, right side: Secondary | ICD-10-CM | POA: Diagnosis not present

## 2022-08-22 MED ORDER — PRIMIDONE 50 MG PO TABS: 50.0000 mg | ORAL_TABLET | Freq: Every day | ORAL | 3 refills | Status: DC

## 2022-08-22 NOTE — Telephone Encounter (Signed)
Needs OV appt in APP clinic please RG

## 2022-08-22 NOTE — Patient Instructions (Signed)
I will increase your primidone today to 50 mg at bedtime (1 full pill) to see if this helps further with your tremor or muscle jerks. Call me if you have any problems with the medication or an increase in symptoms.  I want to see you back in 6 months or sooner if needed.  The physicians and staff at [facility] are committed to providing excellent care. You may receive a survey requesting feedback about your experience at our office. We strive to receive "very good" responses to the survey questions. If you feel that your experience would prevent you from giving the office a "very good " response, please contact our office to try to remedy the situation. We may be reached at [phone]. Thank you for taking the time out of your busy day to complete the survey.  Kai Levins, MD [facility]  Preventing Falls at [facility] are common, often dreaded events in the lives of older people. Aside from the obvious injuries and even death that may result, fall can cause wide-ranging consequences including loss of independence, mental decline, decreased activity and mobility. Younger people are also at risk of falling, especially those with chronic illnesses and fatigue.  Ways to reduce risk for falling Examine diet and medications. Warm foods and alcohol dilate blood vessels, which can lead to dizziness when standing. Sleep aids, antidepressants and pain medications can also increase the likelihood of a fall.  Get a vision exam. Poor vision, cataracts and glaucoma increase the chances of falling.  Check foot gear. Shoes should fit snugly and have a sturdy, nonskid sole and a broad, low heel  Participate in a physician-approved exercise program to build and maintain muscle strength and improve balance and coordination. Programs that use ankle weights or stretch bands are excellent for muscle-strengthening. Water aerobics programs and low-impact Tai Chi programs have also been shown to improve  balance and coordination.  Increase vitamin D intake. Vitamin D improves muscle strength and increases the amount of calcium the body is able to absorb and deposit in bones.  How to prevent falls from common hazards Floors - Remove all loose wires, cords, and throw rugs. Minimize clutter. Make sure rugs are anchored and smooth. Keep furniture in its usual place.  Chairs -- Use chairs with straight backs, armrests and firm seats. Add firm cushions to existing pieces to add height.  Bathroom - Install grab bars and non-skid tape in the tub or shower. Use a bathtub transfer bench or a shower chair with a back support Use an elevated toilet seat and/or safety rails to assist standing from a low surface. Do not use towel racks or bathroom tissue holders to help you stand.  Lighting - Make sure halls, stairways, and entrances are well-lit. Install a night light in your bathroom or hallway. Make sure there is a light switch at the top and bottom of the staircase. Turn lights on if you get up in the middle of the night. Make sure lamps or light switches are within reach of the bed if you have to get up during the night.  Kitchen - Install non-skid rubber mats near the sink and stove. Clean spills immediately. Store frequently used utensils, pots, pans between waist and eye level. This helps prevent reaching and bending. Sit when getting things out of lower cupboards.  Living room/ Bedrooms - Place furniture with wide spaces in between, giving enough room to move around. Establish a route through the living room that gives you  something to hold onto as you walk.  Stairs - Make sure treads, rails, and rugs are secure. Install a rail on both sides of the stairs. If stairs are a threat, it might be helpful to arrange most of your activities on the lower level to reduce the number of times you must climb the stairs.  Entrances and doorways - Install metal handles on the walls adjacent to the doorknobs of all  doors to make it more secure as you travel through the doorway.  Tips for maintaining balance Keep at least one hand free at all times. Try using a backpack or fanny pack to hold things rather than carrying them in your hands. Never carry objects in both hands when walking as this interferes with keeping your balance.  Attempt to swing both arms from front to back while walking. This might require a conscious effort if Parkinson's disease has diminished your movement. It will, however, help you to maintain balance and posture, and reduce fatigue.  Consciously lift your feet off of the ground when walking. Shuffling and dragging of the feet is a common culprit in losing your balance.  When trying to navigate turns, use a "U" technique of facing forward and making a wide turn, rather than pivoting sharply.  Try to stand with your feet shoulder-length apart. When your feet are close together for any length of time, you increase your risk of losing your balance and falling.  Do one thing at a time. Don't try to walk and accomplish another task, such as reading or looking around. The decrease in your automatic reflexes complicates motor function, so the less distraction, the better.  Do not wear rubber or gripping soled shoes, they might "catch" on the floor and cause tripping.  Move slowly when changing positions. Use deliberate, concentrated movements and, if needed, use a grab bar or walking aid. Count 15 seconds between each movement. For example, when rising from a seated position, wait 15 seconds after standing to begin walking.  If balance is a continuous problem, you might want to consider a walking aid such as a cane, walking stick, or walker. Once you've mastered walking with help, you might be ready to try it on your own again.

## 2022-08-24 ENCOUNTER — Other Ambulatory Visit: Payer: Self-pay | Admitting: Internal Medicine

## 2022-08-24 DIAGNOSIS — D520 Dietary folate deficiency anemia: Secondary | ICD-10-CM

## 2022-08-24 DIAGNOSIS — D510 Vitamin B12 deficiency anemia due to intrinsic factor deficiency: Secondary | ICD-10-CM

## 2022-08-24 DIAGNOSIS — D538 Other specified nutritional anemias: Secondary | ICD-10-CM

## 2022-08-25 ENCOUNTER — Other Ambulatory Visit: Payer: Self-pay

## 2022-08-25 NOTE — Addendum Note (Signed)
Addended by: Renae Gloss on: 08/25/2022 01:15 PM   Modules accepted: Orders

## 2022-08-26 ENCOUNTER — Encounter: Payer: Self-pay | Admitting: Oncology

## 2022-08-26 DIAGNOSIS — I2510 Atherosclerotic heart disease of native coronary artery without angina pectoris: Secondary | ICD-10-CM | POA: Diagnosis not present

## 2022-08-26 DIAGNOSIS — I129 Hypertensive chronic kidney disease with stage 1 through stage 4 chronic kidney disease, or unspecified chronic kidney disease: Secondary | ICD-10-CM | POA: Diagnosis not present

## 2022-08-26 DIAGNOSIS — G250 Essential tremor: Secondary | ICD-10-CM | POA: Diagnosis not present

## 2022-08-26 DIAGNOSIS — D509 Iron deficiency anemia, unspecified: Secondary | ICD-10-CM | POA: Diagnosis not present

## 2022-08-26 DIAGNOSIS — N183 Chronic kidney disease, stage 3 unspecified: Secondary | ICD-10-CM | POA: Diagnosis not present

## 2022-08-26 DIAGNOSIS — M5441 Lumbago with sciatica, right side: Secondary | ICD-10-CM | POA: Diagnosis not present

## 2022-08-28 ENCOUNTER — Telehealth: Payer: Self-pay

## 2022-08-28 NOTE — Telephone Encounter (Signed)
Called patient and made her aware she will not have to have another iron infusion that her levels are stable at this time as per Dr. Federico Flake. Patient voiced understanding and was pleased to hear the news.

## 2022-09-01 ENCOUNTER — Encounter: Payer: Self-pay | Admitting: Gastroenterology

## 2022-09-03 DIAGNOSIS — G250 Essential tremor: Secondary | ICD-10-CM | POA: Diagnosis not present

## 2022-09-03 DIAGNOSIS — I129 Hypertensive chronic kidney disease with stage 1 through stage 4 chronic kidney disease, or unspecified chronic kidney disease: Secondary | ICD-10-CM | POA: Diagnosis not present

## 2022-09-03 DIAGNOSIS — D509 Iron deficiency anemia, unspecified: Secondary | ICD-10-CM | POA: Diagnosis not present

## 2022-09-03 DIAGNOSIS — M5441 Lumbago with sciatica, right side: Secondary | ICD-10-CM | POA: Diagnosis not present

## 2022-09-03 DIAGNOSIS — N183 Chronic kidney disease, stage 3 unspecified: Secondary | ICD-10-CM | POA: Diagnosis not present

## 2022-09-03 DIAGNOSIS — I2510 Atherosclerotic heart disease of native coronary artery without angina pectoris: Secondary | ICD-10-CM | POA: Diagnosis not present

## 2022-09-05 DIAGNOSIS — D509 Iron deficiency anemia, unspecified: Secondary | ICD-10-CM | POA: Diagnosis not present

## 2022-09-05 DIAGNOSIS — G250 Essential tremor: Secondary | ICD-10-CM | POA: Diagnosis not present

## 2022-09-05 DIAGNOSIS — I129 Hypertensive chronic kidney disease with stage 1 through stage 4 chronic kidney disease, or unspecified chronic kidney disease: Secondary | ICD-10-CM | POA: Diagnosis not present

## 2022-09-05 DIAGNOSIS — I2510 Atherosclerotic heart disease of native coronary artery without angina pectoris: Secondary | ICD-10-CM | POA: Diagnosis not present

## 2022-09-05 DIAGNOSIS — M5441 Lumbago with sciatica, right side: Secondary | ICD-10-CM | POA: Diagnosis not present

## 2022-09-05 DIAGNOSIS — N183 Chronic kidney disease, stage 3 unspecified: Secondary | ICD-10-CM | POA: Diagnosis not present

## 2022-09-08 ENCOUNTER — Telehealth: Payer: Self-pay | Admitting: Internal Medicine

## 2022-09-08 DIAGNOSIS — I2510 Atherosclerotic heart disease of native coronary artery without angina pectoris: Secondary | ICD-10-CM | POA: Diagnosis not present

## 2022-09-08 DIAGNOSIS — M5441 Lumbago with sciatica, right side: Secondary | ICD-10-CM | POA: Diagnosis not present

## 2022-09-08 DIAGNOSIS — G250 Essential tremor: Secondary | ICD-10-CM | POA: Diagnosis not present

## 2022-09-08 DIAGNOSIS — N183 Chronic kidney disease, stage 3 unspecified: Secondary | ICD-10-CM | POA: Diagnosis not present

## 2022-09-08 DIAGNOSIS — D509 Iron deficiency anemia, unspecified: Secondary | ICD-10-CM | POA: Diagnosis not present

## 2022-09-08 DIAGNOSIS — I129 Hypertensive chronic kidney disease with stage 1 through stage 4 chronic kidney disease, or unspecified chronic kidney disease: Secondary | ICD-10-CM | POA: Diagnosis not present

## 2022-09-08 NOTE — Telephone Encounter (Signed)
Courtney Brown with [facility] called reporting that the Pt blood pressure is low 112?52 and pulse was reading 59.

## 2022-09-09 ENCOUNTER — Other Ambulatory Visit: Payer: Self-pay | Admitting: Internal Medicine

## 2022-09-09 DIAGNOSIS — I10 Essential (primary) hypertension: Secondary | ICD-10-CM

## 2022-09-09 MED ORDER — METOPROLOL SUCCINATE ER 50 MG PO TB24: 50.0000 mg | ORAL_TABLET | Freq: Every day | ORAL | 0 refills | Status: DC

## 2022-09-09 NOTE — Telephone Encounter (Signed)
LVM to discuss

## 2022-09-10 DIAGNOSIS — I129 Hypertensive chronic kidney disease with stage 1 through stage 4 chronic kidney disease, or unspecified chronic kidney disease: Secondary | ICD-10-CM | POA: Diagnosis not present

## 2022-09-10 DIAGNOSIS — M5441 Lumbago with sciatica, right side: Secondary | ICD-10-CM | POA: Diagnosis not present

## 2022-09-10 DIAGNOSIS — D509 Iron deficiency anemia, unspecified: Secondary | ICD-10-CM | POA: Diagnosis not present

## 2022-09-10 DIAGNOSIS — I2510 Atherosclerotic heart disease of native coronary artery without angina pectoris: Secondary | ICD-10-CM | POA: Diagnosis not present

## 2022-09-10 DIAGNOSIS — N183 Chronic kidney disease, stage 3 unspecified: Secondary | ICD-10-CM | POA: Diagnosis not present

## 2022-09-10 DIAGNOSIS — G250 Essential tremor: Secondary | ICD-10-CM | POA: Diagnosis not present

## 2022-09-15 DIAGNOSIS — D509 Iron deficiency anemia, unspecified: Secondary | ICD-10-CM | POA: Diagnosis not present

## 2022-09-15 DIAGNOSIS — N183 Chronic kidney disease, stage 3 unspecified: Secondary | ICD-10-CM | POA: Diagnosis not present

## 2022-09-15 DIAGNOSIS — M5441 Lumbago with sciatica, right side: Secondary | ICD-10-CM | POA: Diagnosis not present

## 2022-09-15 DIAGNOSIS — I2510 Atherosclerotic heart disease of native coronary artery without angina pectoris: Secondary | ICD-10-CM | POA: Diagnosis not present

## 2022-09-15 DIAGNOSIS — G250 Essential tremor: Secondary | ICD-10-CM | POA: Diagnosis not present

## 2022-09-15 DIAGNOSIS — I129 Hypertensive chronic kidney disease with stage 1 through stage 4 chronic kidney disease, or unspecified chronic kidney disease: Secondary | ICD-10-CM | POA: Diagnosis not present

## 2022-09-18 DIAGNOSIS — D509 Iron deficiency anemia, unspecified: Secondary | ICD-10-CM | POA: Diagnosis not present

## 2022-09-18 DIAGNOSIS — N183 Chronic kidney disease, stage 3 unspecified: Secondary | ICD-10-CM | POA: Diagnosis not present

## 2022-09-18 DIAGNOSIS — M5441 Lumbago with sciatica, right side: Secondary | ICD-10-CM | POA: Diagnosis not present

## 2022-09-18 DIAGNOSIS — I129 Hypertensive chronic kidney disease with stage 1 through stage 4 chronic kidney disease, or unspecified chronic kidney disease: Secondary | ICD-10-CM | POA: Diagnosis not present

## 2022-09-18 DIAGNOSIS — I2510 Atherosclerotic heart disease of native coronary artery without angina pectoris: Secondary | ICD-10-CM | POA: Diagnosis not present

## 2022-09-18 DIAGNOSIS — G250 Essential tremor: Secondary | ICD-10-CM | POA: Diagnosis not present

## 2022-09-22 DIAGNOSIS — M5441 Lumbago with sciatica, right side: Secondary | ICD-10-CM | POA: Diagnosis not present

## 2022-09-22 DIAGNOSIS — I129 Hypertensive chronic kidney disease with stage 1 through stage 4 chronic kidney disease, or unspecified chronic kidney disease: Secondary | ICD-10-CM | POA: Diagnosis not present

## 2022-09-22 DIAGNOSIS — D509 Iron deficiency anemia, unspecified: Secondary | ICD-10-CM | POA: Diagnosis not present

## 2022-09-22 DIAGNOSIS — G250 Essential tremor: Secondary | ICD-10-CM | POA: Diagnosis not present

## 2022-09-22 DIAGNOSIS — N183 Chronic kidney disease, stage 3 unspecified: Secondary | ICD-10-CM | POA: Diagnosis not present

## 2022-09-22 DIAGNOSIS — I2510 Atherosclerotic heart disease of native coronary artery without angina pectoris: Secondary | ICD-10-CM | POA: Diagnosis not present

## 2022-09-23 NOTE — Progress Notes (Unsigned)
[place] Phone: [phone] Subjective:   Fontaine No, am serving as a scribe for Dr. Hulan Saas.  I'm seeing this patient by the request  of:  Janith Lima, MD  CC: Knee and back pain follow-up  QA:[id number]  08/20/2022 Concern mostly with the spinal stenosis in the back. Patient is still describing it. States that there is weakness as well as significant fatigue in her lower extremities. Patient is concerned though more that this is secondary to the iron deficiency. Patient is to be scheduling a colonoscopy and an EGD soon. Patient also had elevation of calcium as well as white blood cells. Encouraged her to continue to keep close attention to these lab values as well. We discussed with patient about possible advanced imaging of the lower back and history of melanoma. Patient wants to hold at this time and would like to consider home health physical therapy. Patient is going to check with her insurance to see which 1 would be covered and we would put in referral if necessary. Follow-up with me again follow-up again 6 to 8 weeks. Total time reviewing patient's previous imaging and discussing with patient 33 minute   Knee pain significantly better after the injection. No other changes in management at this time. Follow-up again in 6 to 8 weeks   Updated 09/24/2022 Rejina Lundin is a 82 y.o. female coming in with complaint of knee and back pain. Patient has been doing physical therapy and feels stronger. Able to ambulate without walker. Increase in gabapentin has helped to reduce tingling in legs. Standing in one spot will cause pain in thoracic spine that radiates into lumbar spine. States that while stronger she still has good and bad days. Has been working on HEP when she doesn't see her therapist.   Going to see gastroenterologist in March.        Past Medical History:  Diagnosis Date   Allergy     Anxiety    Arthritis    Cancer ([place])    hx of melanoma    Colon polyps    COPD (chronic obstructive pulmonary disease) ([place])    Coronary artery disease    Dyspnea    with exertion    Edema    in lower extremties    Emphysema of lung ([facility])    GERD (gastroesophageal reflux disease)    H/O hiatal hernia    Hyperlipidemia    Hypertension    Inverted nipple    bilateral, pt says that's normal   Myocardial infarct, old 2008   no stent placed   Osteoporosis    Pneumonia    hx of    Sepsis ([place])    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    Stroke [facility])    TIA   Past Surgical History:  Procedure Laterality Date   [place]  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   HERNIA REPAIR     umbilical, ventral   INCISIONAL HERNIA REPAIR N/A 10/26/2020   Procedure: DIAGNOSTIC LAPAROSCOPY MESH EXPLANTATION; SMALL BOWEL RESECTION; PRIMARY REPAIR OF INCISIONAL HERNIA;  Surgeon: Kieth Brightly, [name], MD;  Location: [facility];  Service: General;  Laterality: N/A;   INSERTION OF ILIAC STENT  2008   Social History   Socioeconomic History   Marital status: Widowed    Spouse name: Not on file   Number of  children: Not on file   Years of education: Not on file   Highest education level: Not on file  Occupational History   Not on file  Tobacco Use   Smoking status: Former    Packs/day: 1.50    Years: 44.00    Total pack years: 66.00    Types: Cigarettes    Quit date: 07/28/2005    Years since quitting: 17.1   Smokeless tobacco: Never  Vaping Use   Vaping Use: Never used  Substance and Sexual Activity   Alcohol use: Yes    Alcohol/week: 2.0 standard drinks of alcohol    Types: 2 Shots of liquor per week    Comment: rarely   Drug use: No   Sexual activity: Not Currently  Other Topics Concern   Not on file  Social History Narrative   Right handed   Caffeine 64 0z a day   Home is one story    Lives with  daughter   Retired.   Social Determinants of Health   Financial Resource Strain: Not on file  Food Insecurity: Not on file  Transportation Needs: Not on file  Physical Activity: Not on file  Stress: Not on file  Social Connections: Not on file   Allergies  Allergen Reactions   Prednisone Other (See Comments)    Leg pain,light headed   Statins     Myalgias- per patient, she feels better off the medication   Family History  Problem Relation Age of Onset   Hearing loss Father    Hearing loss Maternal Aunt    Hearing loss Maternal Grandmother    Cancer Maternal Grandmother        colon   Arthritis Mother    Cancer Paternal Grandmother        colon   Breast cancer Neg Hx     Current Outpatient Medications (Endocrine & Metabolic):    raloxifene (EVISTA) 60 MG tablet, TAKE 1 TABLET BY MOUTH EVERY DAY   Current Outpatient Medications (Cardiovascular):    colesevelam (WELCHOL) 625 MG tablet, TAKE 1 TABLET (625 MG TOTAL) BY MOUTH 2 (TWO) TIMES DAILY WITH A MEAL.   fenofibrate (TRICOR) 145 MG tablet, TAKE 1 TABLET BY MOUTH EVERY DAY   furosemide (LASIX) 20 MG tablet, Take 1 tablet (20 mg total) by mouth daily.   metoprolol succinate (TOPROL-XL) 50 MG 24 hr tablet, Take 1 tablet (50 mg total) by mouth daily.   Current Outpatient Medications (Respiratory):    albuterol (VENTOLIN HFA) 108 (90 Base) MCG/ACT inhaler, INHALE 1-2 PUFFS BY MOUTH EVERY 6 HOURS AS NEEDED FOR WHEEZE OR SHORTNESS OF BREATH   budesonide-formoterol (SYMBICORT) 160-4.5 MCG/ACT inhaler, TAKE 2 PUFFS BY MOUTH TWICE A DAY   loratadine (CLARITIN) 10 MG tablet, TAKE 1 TABLET BY MOUTH EVERY DAY   mometasone (NASONEX) 50 MCG/ACT nasal spray, PLACE 2 SPRAYS INTO THE NOSE DAILY.   SPIRIVA RESPIMAT 2.5 MCG/ACT AERS, INHALE 2 PUFFS BY MOUTH PER DAY   Current Outpatient Medications (Analgesics):    acetaminophen (TYLENOL) 500 MG tablet, Take 500 mg by mouth every 6 (six) hours as needed for mild pain or moderate  pain.   aspirin 81 MG tablet, Take 81 mg by mouth at bedtime.   Current Outpatient Medications (Hematological):    Cyanocobalamin (B-12 COMPLIANCE INJECTION IJ), Inject as directed. One time a month   cyanocobalamin (VITAMIN B12) 1000 MCG tablet, Take 1 tablet (1,000 mcg total) by mouth daily.   Ferric Maltol (ACCRUFER) 30 MG CAPS, Take  1 capsule by mouth in the morning and at bedtime.   folic acid (FOLVITE) 1 MG tablet, TAKE 1 TABLET BY MOUTH EVERY DAY  Current Facility-Administered Medications (Hematological):    cyanocobalamin (VITAMIN B12) injection 1,000 mcg  Current Outpatient Medications (Other):    ALPRAZolam (XANAX) 0.25 MG tablet, Take 1 tablet (0.25 mg total) by mouth 2 (two) times daily as needed for anxiety.   Calcium Citrate-Vitamin D (CITRACAL + D PO), Take 1 tablet by mouth 2 (two) times daily. 600 mg calcium   gabapentin (NEURONTIN) 100 MG capsule, Take 2 capsules (200 mg total) by mouth 2 (two) times daily.   Garlic Oil 123XX123 MG CAPS, Take 1,000 mg by mouth daily.   Lactobacillus (PROBIOTIC ACIDOPHILUS) CAPS, Take 1 tablet by mouth daily.   magnesium oxide (MAG-OX) 400 (240 Mg) MG tablet, TAKE 1 TABLET BY MOUTH TWICE A DAY   omeprazole (PRILOSEC) 20 MG capsule, TAKE 1 CAPSULE BY MOUTH EVERY DAY   primidone (MYSOLINE) 50 MG tablet, Take 1 tablet (50 mg total) by mouth at bedtime.   triamcinolone cream (KENALOG) 0.1 %, Apply 1 application topically 2 (two) times daily. (Patient taking differently: Apply 1 application  topically daily as needed (yeast infection under breast).)   Zinc 50 MG TABS, TAKE 1 TABLET BY MOUTH EVERY DAY   zinc gluconate 50 MG tablet, Take 1 tablet (50 mg total) by mouth daily.    Review of Systems:  No headache, visual changes, nausea, vomiting, diarrhea, constipation, dizziness, abdominal pain, skin rash, fevers, chills, night sweats, weight loss, swollen lymph nodes, body aches, joint swelling, chest pain, shortness of breath, mood changes.  POSITIVE muscle aches  Objective  Blood pressure (!) 108/54, pulse 81, height 5' (1.524 m), weight 141 lb (64 kg), SpO2 98 %.   General: No apparent distress alert and oriented x3 mood and affect normal, dressed appropriately.  HEENT: Pupils equal, extraocular movements intact  Respiratory: Patient's speak in full sentences and does not appear short of breath  Patient is able to get out of chair without any significant difficulty at the moment. Patient does seem to be significantly feeling better.  Right knee still has some crepitus noted but not as severe   Impression and Recommendations:    The above documentation has been reviewed and is accurate and complete Lyndal Pulley, DO

## 2022-09-24 ENCOUNTER — Encounter: Payer: Self-pay | Admitting: Oncology

## 2022-09-24 ENCOUNTER — Ambulatory Visit (INDEPENDENT_AMBULATORY_CARE_PROVIDER_SITE_OTHER): Payer: 59 | Admitting: Family Medicine

## 2022-09-24 VITALS — BP 108/54 | HR 81 | Ht 60.0 in | Wt 141.0 lb

## 2022-09-24 DIAGNOSIS — G8929 Other chronic pain: Secondary | ICD-10-CM

## 2022-09-24 DIAGNOSIS — M1711 Unilateral primary osteoarthritis, right knee: Secondary | ICD-10-CM | POA: Diagnosis not present

## 2022-09-24 DIAGNOSIS — M5441 Lumbago with sciatica, right side: Secondary | ICD-10-CM | POA: Diagnosis not present

## 2022-09-24 MED ORDER — GABAPENTIN 100 MG PO CAPS: 200.0000 mg | ORAL_CAPSULE | Freq: Two times a day (BID) | ORAL | 0 refills | Status: DC

## 2022-09-24 NOTE — Patient Instructions (Addendum)
Gabapentin '200mg'$   Happy you great See me in 3 months

## 2022-09-24 NOTE — Assessment & Plan Note (Signed)
Patient is no longer having any sciatica.  Responding well to the gabapentin.  Did discuss potentially increasing to up to 100 mg in the a.m. and 300 mg at night if needed.  This can help with the rest of patient's leg pain that she is having.  Discussed if worsening pain in the knee can give injection with necessary.  Discussed icing regimen and home exercises.  Follow-up again in 6 to 8 weeks

## 2022-09-24 NOTE — Assessment & Plan Note (Signed)
Stable since the injection.  No other big changes at this point.

## 2022-09-25 ENCOUNTER — Other Ambulatory Visit: Payer: Self-pay | Admitting: Neurology

## 2022-09-25 DIAGNOSIS — G250 Essential tremor: Secondary | ICD-10-CM

## 2022-09-29 ENCOUNTER — Telehealth: Payer: Self-pay

## 2022-09-29 DIAGNOSIS — D509 Iron deficiency anemia, unspecified: Secondary | ICD-10-CM | POA: Diagnosis not present

## 2022-09-29 DIAGNOSIS — M5441 Lumbago with sciatica, right side: Secondary | ICD-10-CM | POA: Diagnosis not present

## 2022-09-29 DIAGNOSIS — N183 Chronic kidney disease, stage 3 unspecified: Secondary | ICD-10-CM | POA: Diagnosis not present

## 2022-09-29 DIAGNOSIS — I2510 Atherosclerotic heart disease of native coronary artery without angina pectoris: Secondary | ICD-10-CM | POA: Diagnosis not present

## 2022-09-29 DIAGNOSIS — G250 Essential tremor: Secondary | ICD-10-CM | POA: Diagnosis not present

## 2022-09-29 DIAGNOSIS — I129 Hypertensive chronic kidney disease with stage 1 through stage 4 chronic kidney disease, or unspecified chronic kidney disease: Secondary | ICD-10-CM | POA: Diagnosis not present

## 2022-09-29 NOTE — Telephone Encounter (Signed)
Call and left detailed message on mail voice.

## 2022-09-29 NOTE — Telephone Encounter (Signed)
Anda Kraft called in from [facility] to notify us that the patients BP was low, reading 108/54.

## 2022-09-30 ENCOUNTER — Telehealth: Payer: Self-pay | Admitting: Anesthesiology

## 2022-09-30 ENCOUNTER — Telehealth: Payer: Self-pay | Admitting: Neurology

## 2022-09-30 NOTE — Telephone Encounter (Signed)
Called Pt and left a message to call PCP about her B/P

## 2022-09-30 NOTE — Telephone Encounter (Signed)
Pt left message with AN stating she is returning Renee's call.

## 2022-09-30 NOTE — Telephone Encounter (Signed)
Pt called in and left a message. She stated [facility] told her we did not authorize a refill of her primidone. She is wondering if Dr. Berdine Addison doesn't want her to take it anymore?

## 2022-10-01 NOTE — Telephone Encounter (Signed)
Called pharmacy and she can pick it up in one hour, called and informed Pt.Marland Kitchen

## 2022-10-03 ENCOUNTER — Ambulatory Visit: Payer: 59 | Admitting: Gastroenterology

## 2022-10-06 ENCOUNTER — Other Ambulatory Visit: Payer: Self-pay | Admitting: Internal Medicine

## 2022-10-06 DIAGNOSIS — F411 Generalized anxiety disorder: Secondary | ICD-10-CM

## 2022-10-06 DIAGNOSIS — J449 Chronic obstructive pulmonary disease, unspecified: Secondary | ICD-10-CM

## 2022-10-07 DIAGNOSIS — M5441 Lumbago with sciatica, right side: Secondary | ICD-10-CM | POA: Diagnosis not present

## 2022-10-07 DIAGNOSIS — N183 Chronic kidney disease, stage 3 unspecified: Secondary | ICD-10-CM | POA: Diagnosis not present

## 2022-10-07 DIAGNOSIS — I2510 Atherosclerotic heart disease of native coronary artery without angina pectoris: Secondary | ICD-10-CM | POA: Diagnosis not present

## 2022-10-07 DIAGNOSIS — D509 Iron deficiency anemia, unspecified: Secondary | ICD-10-CM | POA: Diagnosis not present

## 2022-10-07 DIAGNOSIS — I129 Hypertensive chronic kidney disease with stage 1 through stage 4 chronic kidney disease, or unspecified chronic kidney disease: Secondary | ICD-10-CM | POA: Diagnosis not present

## 2022-10-07 DIAGNOSIS — G250 Essential tremor: Secondary | ICD-10-CM | POA: Diagnosis not present

## 2022-10-13 ENCOUNTER — Other Ambulatory Visit: Payer: Self-pay | Admitting: Internal Medicine

## 2022-10-13 ENCOUNTER — Telehealth: Payer: Self-pay | Admitting: Internal Medicine

## 2022-10-13 DIAGNOSIS — J449 Chronic obstructive pulmonary disease, unspecified: Secondary | ICD-10-CM

## 2022-10-13 MED ORDER — ALBUTEROL SULFATE HFA 108 (90 BASE) MCG/ACT IN AERS: INHALATION_SPRAY | RESPIRATORY_TRACT | 3 refills | Status: DC

## 2022-10-13 NOTE — Telephone Encounter (Signed)
Prescription Request  10/13/2022  LOV: 06/04/2022  What is the name of the medication or equipment? albuterol (VENTOLIN HFA) 108 (90 Base) MCG/ACT inhale Pt has an upcoming appt Have you contacted your pharmacy to request a refill? No   Which pharmacy would you like this sent to?  [id number] [address] [place] [address]  [address] Phone: [phone] Fax: [phone]  [facility] [place] - [phone] [place] [address] Phone: [phone] Fax: [phone]    Patient notified that their request is being sent to the clinical staff for review and that they should receive a response within 2 business days.   Please advise at Mobile [phone] (mobile)

## 2022-10-16 ENCOUNTER — Telehealth: Payer: Self-pay | Admitting: Oncology

## 2022-10-16 NOTE — Telephone Encounter (Signed)
Reached out to reschedule patient. Patient aware of date and time of appointment change.

## 2022-10-23 ENCOUNTER — Inpatient Hospital Stay (HOSPITAL_COMMUNITY)
Admission: EM | Admit: 2022-10-23 | Discharge: 2022-10-25 | DRG: 194 | Disposition: A | Discharge status: Home / Self Care | Payer: 59 | Attending: Family Medicine | Admitting: Family Medicine

## 2022-10-23 ENCOUNTER — Encounter (HOSPITAL_COMMUNITY): Payer: Self-pay

## 2022-10-23 ENCOUNTER — Emergency Department (HOSPITAL_COMMUNITY): Payer: 59

## 2022-10-23 ENCOUNTER — Other Ambulatory Visit: Payer: Self-pay

## 2022-10-23 DIAGNOSIS — N183 Chronic kidney disease, stage 3 unspecified: Secondary | ICD-10-CM | POA: Diagnosis not present

## 2022-10-23 DIAGNOSIS — I739 Peripheral vascular disease, unspecified: Secondary | ICD-10-CM | POA: Diagnosis present

## 2022-10-23 DIAGNOSIS — Z8261 Family history of arthritis: Secondary | ICD-10-CM

## 2022-10-23 DIAGNOSIS — E538 Deficiency of other specified B group vitamins: Secondary | ICD-10-CM | POA: Diagnosis present

## 2022-10-23 DIAGNOSIS — C3411 Malignant neoplasm of upper lobe, right bronchus or lung: Secondary | ICD-10-CM | POA: Diagnosis not present

## 2022-10-23 DIAGNOSIS — Z8673 Personal history of transient ischemic attack (TIA), and cerebral infarction without residual deficits: Secondary | ICD-10-CM | POA: Diagnosis not present

## 2022-10-23 DIAGNOSIS — J439 Emphysema, unspecified: Secondary | ICD-10-CM | POA: Diagnosis not present

## 2022-10-23 DIAGNOSIS — M810 Age-related osteoporosis without current pathological fracture: Secondary | ICD-10-CM | POA: Diagnosis present

## 2022-10-23 DIAGNOSIS — C7951 Secondary malignant neoplasm of bone: Secondary | ICD-10-CM | POA: Diagnosis not present

## 2022-10-23 DIAGNOSIS — E785 Hyperlipidemia, unspecified: Secondary | ICD-10-CM | POA: Diagnosis present

## 2022-10-23 DIAGNOSIS — R918 Other nonspecific abnormal finding of lung field: Secondary | ICD-10-CM | POA: Diagnosis not present

## 2022-10-23 DIAGNOSIS — C3490 Malignant neoplasm of unspecified part of unspecified bronchus or lung: Secondary | ICD-10-CM

## 2022-10-23 DIAGNOSIS — Z822 Family history of deafness and hearing loss: Secondary | ICD-10-CM

## 2022-10-23 DIAGNOSIS — I5032 Chronic diastolic (congestive) heart failure: Secondary | ICD-10-CM | POA: Diagnosis not present

## 2022-10-23 DIAGNOSIS — K219 Gastro-esophageal reflux disease without esophagitis: Secondary | ICD-10-CM | POA: Diagnosis not present

## 2022-10-23 DIAGNOSIS — E876 Hypokalemia: Secondary | ICD-10-CM | POA: Diagnosis present

## 2022-10-23 DIAGNOSIS — D539 Nutritional anemia, unspecified: Secondary | ICD-10-CM | POA: Diagnosis present

## 2022-10-23 DIAGNOSIS — I252 Old myocardial infarction: Secondary | ICD-10-CM | POA: Diagnosis not present

## 2022-10-23 DIAGNOSIS — Z87891 Personal history of nicotine dependence: Secondary | ICD-10-CM

## 2022-10-23 DIAGNOSIS — R0602 Shortness of breath: Secondary | ICD-10-CM | POA: Diagnosis not present

## 2022-10-23 DIAGNOSIS — C801 Malignant (primary) neoplasm, unspecified: Secondary | ICD-10-CM | POA: Diagnosis not present

## 2022-10-23 DIAGNOSIS — D75839 Thrombocytosis, unspecified: Secondary | ICD-10-CM | POA: Diagnosis present

## 2022-10-23 DIAGNOSIS — J90 Pleural effusion, not elsewhere classified: Secondary | ICD-10-CM | POA: Diagnosis not present

## 2022-10-23 DIAGNOSIS — Z1152 Encounter for screening for COVID-19: Secondary | ICD-10-CM | POA: Diagnosis not present

## 2022-10-23 DIAGNOSIS — J189 Pneumonia, unspecified organism: Secondary | ICD-10-CM | POA: Diagnosis present

## 2022-10-23 DIAGNOSIS — Z888 Allergy status to other drugs, medicaments and biological substances status: Secondary | ICD-10-CM | POA: Diagnosis not present

## 2022-10-23 DIAGNOSIS — F4323 Adjustment disorder with mixed anxiety and depressed mood: Secondary | ICD-10-CM | POA: Diagnosis present

## 2022-10-23 DIAGNOSIS — J440 Chronic obstructive pulmonary disease with acute lower respiratory infection: Secondary | ICD-10-CM | POA: Diagnosis not present

## 2022-10-23 DIAGNOSIS — I10 Essential (primary) hypertension: Secondary | ICD-10-CM | POA: Diagnosis present

## 2022-10-23 DIAGNOSIS — G250 Essential tremor: Secondary | ICD-10-CM | POA: Diagnosis not present

## 2022-10-23 DIAGNOSIS — J449 Chronic obstructive pulmonary disease, unspecified: Secondary | ICD-10-CM | POA: Diagnosis present

## 2022-10-23 DIAGNOSIS — Z79899 Other long term (current) drug therapy: Secondary | ICD-10-CM

## 2022-10-23 DIAGNOSIS — Z7982 Long term (current) use of aspirin: Secondary | ICD-10-CM

## 2022-10-23 DIAGNOSIS — D509 Iron deficiency anemia, unspecified: Secondary | ICD-10-CM | POA: Diagnosis present

## 2022-10-23 DIAGNOSIS — Z7951 Long term (current) use of inhaled steroids: Secondary | ICD-10-CM

## 2022-10-23 DIAGNOSIS — Z85820 Personal history of malignant melanoma of skin: Secondary | ICD-10-CM

## 2022-10-23 DIAGNOSIS — D75838 Other thrombocytosis: Secondary | ICD-10-CM | POA: Diagnosis not present

## 2022-10-23 DIAGNOSIS — I130 Hypertensive heart and chronic kidney disease with heart failure and stage 1 through stage 4 chronic kidney disease, or unspecified chronic kidney disease: Secondary | ICD-10-CM | POA: Diagnosis present

## 2022-10-23 DIAGNOSIS — I2510 Atherosclerotic heart disease of native coronary artery without angina pectoris: Secondary | ICD-10-CM | POA: Diagnosis present

## 2022-10-23 DIAGNOSIS — Z800 Family history of malignant neoplasm of digestive organs: Secondary | ICD-10-CM

## 2022-10-23 HISTORY — DX: Malignant neoplasm of unspecified part of unspecified bronchus or lung: C34.90

## 2022-10-23 LAB — RESP PANEL BY RT-PCR (RSV, FLU A&B, COVID)  RVPGX2
Influenza A by PCR: NEGATIVE
Influenza B by PCR: NEGATIVE
Resp Syncytial Virus by PCR: NEGATIVE
SARS Coronavirus 2 by RT PCR: NEGATIVE

## 2022-10-23 LAB — CBC
HCT: 34.7 % — ABNORMAL LOW (ref 36.0–46.0)
Hemoglobin: 10.1 g/dL — ABNORMAL LOW (ref 12.0–15.0)
MCH: 25.3 pg — ABNORMAL LOW (ref 26.0–34.0)
MCHC: 29.1 g/dL — ABNORMAL LOW (ref 30.0–36.0)
MCV: 86.8 fL (ref 80.0–100.0)
Platelets: 723 10*3/uL — ABNORMAL HIGH (ref 150–400)
RBC: 4 MIL/uL (ref 3.87–5.11)
RDW: 15.6 % — ABNORMAL HIGH (ref 11.5–15.5)
WBC: 17.8 10*3/uL — ABNORMAL HIGH (ref 4.0–10.5)
nRBC: 0 % (ref 0.0–0.2)

## 2022-10-23 LAB — TROPONIN I (HIGH SENSITIVITY): Troponin I (High Sensitivity): 3 ng/L (ref <18)

## 2022-10-23 LAB — BASIC METABOLIC PANEL
Anion gap: 8 (ref 5–15)
BUN: 17 mg/dL (ref 8–23)
CO2: 25 mmol/L (ref 22–32)
Calcium: 9.6 mg/dL (ref 8.9–10.3)
Chloride: 104 mmol/L (ref 98–111)
Creatinine, Ser: 0.64 mg/dL (ref 0.44–1.00)
GFR, Estimated: >60 mL/min (ref >60)
Glucose, Bld: 114 mg/dL — ABNORMAL HIGH (ref 70–99)
Potassium: 4.4 mmol/L (ref 3.5–5.1)
Sodium: 137 mmol/L (ref 135–145)

## 2022-10-23 MED ORDER — ZINC 50 MG PO TABS: 1.0000 | ORAL_TABLET | Freq: Every day | ORAL | Status: DC

## 2022-10-23 MED ORDER — FOLIC ACID 1 MG PO TABS
1.0000 mg | ORAL_TABLET | Freq: Every day | ORAL | Status: DC
  Administered 2022-10-24 – 2022-10-25 (×2): 1 mg via ORAL
  Filled 2022-10-23 (×2): qty 1

## 2022-10-23 MED ORDER — ACETAMINOPHEN 650 MG RE SUPP: 650.0000 mg | Freq: Four times a day (QID) | RECTAL | Status: DC | PRN

## 2022-10-23 MED ORDER — ZINC SULFATE 220 (50 ZN) MG PO CAPS
220.0000 mg | ORAL_CAPSULE | Freq: Every day | ORAL | Status: DC
  Administered 2022-10-24 – 2022-10-25 (×2): 220 mg via ORAL
  Filled 2022-10-23 (×2): qty 1

## 2022-10-23 MED ORDER — HYDROCODONE-ACETAMINOPHEN 5-325 MG PO TABS
1.0000 | ORAL_TABLET | ORAL | Status: DC | PRN
  Administered 2022-10-23: 1 via ORAL
  Filled 2022-10-23: qty 1

## 2022-10-23 MED ORDER — GUAIFENESIN ER 600 MG PO TB12
600.0000 mg | ORAL_TABLET | Freq: Two times a day (BID) | ORAL | Status: DC
  Administered 2022-10-24 – 2022-10-25 (×4): 600 mg via ORAL
  Filled 2022-10-23 (×4): qty 1

## 2022-10-23 MED ORDER — PRIMIDONE 50 MG PO TABS
50.0000 mg | ORAL_TABLET | Freq: Every day | ORAL | Status: DC
  Administered 2022-10-24 (×2): 50 mg via ORAL
  Filled 2022-10-23 (×3): qty 1

## 2022-10-23 MED ORDER — SODIUM CHLORIDE 0.9 % IV SOLN
1.0000 g | Freq: Once | INTRAVENOUS | Status: AC
  Administered 2022-10-23: 1 g via INTRAVENOUS
  Filled 2022-10-23: qty 10

## 2022-10-23 MED ORDER — ENOXAPARIN SODIUM 40 MG/0.4ML IJ SOSY
40.0000 mg | PREFILLED_SYRINGE | INTRAMUSCULAR | Status: DC
  Administered 2022-10-23 – 2022-10-24 (×2): 40 mg via SUBCUTANEOUS
  Filled 2022-10-23 (×2): qty 0.4

## 2022-10-23 MED ORDER — ALBUTEROL SULFATE (2.5 MG/3ML) 0.083% IN NEBU: 2.5000 mg | INHALATION_SOLUTION | RESPIRATORY_TRACT | Status: DC | PRN

## 2022-10-23 MED ORDER — SODIUM CHLORIDE 0.9 % IV SOLN
1.0000 g | INTRAVENOUS | Status: DC
  Filled 2022-10-23: qty 10

## 2022-10-23 MED ORDER — DEXTROSE 5 % IV SOLN: 250.0000 mg | INTRAVENOUS | Status: DC

## 2022-10-23 MED ORDER — METOPROLOL SUCCINATE ER 25 MG PO TB24
50.0000 mg | ORAL_TABLET | Freq: Two times a day (BID) | ORAL | Status: DC
  Administered 2022-10-24 – 2022-10-25 (×3): 50 mg via ORAL
  Filled 2022-10-23 (×4): qty 2

## 2022-10-23 MED ORDER — OXYCODONE-ACETAMINOPHEN 5-325 MG PO TABS
1.0000 | ORAL_TABLET | Freq: Once | ORAL | Status: AC
  Administered 2022-10-23: 1 via ORAL
  Filled 2022-10-23: qty 1

## 2022-10-23 MED ORDER — FERROUS SULFATE 325 (65 FE) MG PO TABS
325.0000 mg | ORAL_TABLET | Freq: Two times a day (BID) | ORAL | Status: DC
  Administered 2022-10-24 – 2022-10-25 (×3): 325 mg via ORAL
  Filled 2022-10-23 (×3): qty 1

## 2022-10-23 MED ORDER — ALPRAZOLAM 0.25 MG PO TABS
0.2500 mg | ORAL_TABLET | Freq: Two times a day (BID) | ORAL | Status: DC | PRN
  Administered 2022-10-23: 0.25 mg via ORAL
  Filled 2022-10-23: qty 1

## 2022-10-23 MED ORDER — FERRIC MALTOL 30 MG PO CAPS: 1.0000 | ORAL_CAPSULE | Freq: Two times a day (BID) | ORAL | Status: DC

## 2022-10-23 MED ORDER — UMECLIDINIUM BROMIDE 62.5 MCG/ACT IN AEPB
1.0000 | INHALATION_SPRAY | Freq: Every day | RESPIRATORY_TRACT | Status: DC
  Administered 2022-10-24 – 2022-10-25 (×2): 1 via RESPIRATORY_TRACT
  Filled 2022-10-23: qty 7

## 2022-10-23 MED ORDER — IOHEXOL 350 MG/ML SOLN
100.0000 mL | Freq: Once | INTRAVENOUS | Status: AC | PRN
  Administered 2022-10-23: 100 mL via INTRAVENOUS

## 2022-10-23 MED ORDER — ONDANSETRON HCL 4 MG PO TABS: 4.0000 mg | ORAL_TABLET | Freq: Four times a day (QID) | ORAL | Status: DC | PRN

## 2022-10-23 MED ORDER — ACETAMINOPHEN 325 MG PO TABS: 650.0000 mg | ORAL_TABLET | Freq: Four times a day (QID) | ORAL | Status: DC | PRN

## 2022-10-23 MED ORDER — SODIUM CHLORIDE 0.9 % IV SOLN
500.0000 mg | INTRAVENOUS | Status: DC
  Administered 2022-10-23: 500 mg via INTRAVENOUS
  Filled 2022-10-23 (×2): qty 5

## 2022-10-23 MED ORDER — PANTOPRAZOLE SODIUM 40 MG PO TBEC
40.0000 mg | DELAYED_RELEASE_TABLET | Freq: Every day | ORAL | Status: DC
  Administered 2022-10-24 – 2022-10-25 (×2): 40 mg via ORAL
  Filled 2022-10-23 (×2): qty 1

## 2022-10-23 MED ORDER — ONDANSETRON HCL 4 MG/2ML IJ SOLN: 4.0000 mg | Freq: Four times a day (QID) | INTRAMUSCULAR | Status: DC | PRN

## 2022-10-23 MED ORDER — GABAPENTIN 100 MG PO CAPS
200.0000 mg | ORAL_CAPSULE | Freq: Two times a day (BID) | ORAL | Status: DC
  Administered 2022-10-23 – 2022-10-25 (×4): 200 mg via ORAL
  Filled 2022-10-23 (×4): qty 2

## 2022-10-23 MED ORDER — VITAMIN B-12 1000 MCG PO TABS
1000.0000 ug | ORAL_TABLET | Freq: Every day | ORAL | Status: DC
  Administered 2022-10-24 – 2022-10-25 (×2): 1000 ug via ORAL
  Filled 2022-10-23 (×2): qty 1

## 2022-10-23 MED ORDER — TIOTROPIUM BROMIDE MONOHYDRATE 2.5 MCG/ACT IN AERS: INHALATION_SPRAY | Freq: Every day | RESPIRATORY_TRACT | Status: DC

## 2022-10-23 NOTE — Hospital Course (Signed)
Courtney Brown is a 82 y.o. female with medical history significant for COPD, HTN, HLD, CVA, anxiety, iron deficiency anemia who is admitted with new finding of right upper lobe mass suspicious for neoplasm and associated pneumonia.

## 2022-10-23 NOTE — ED Triage Notes (Addendum)
Patient has upper back pain and said she thinks she might have fluid building up on her right lung. Courtney Brown it feels like she cannot take a deep breath. Has COPD.

## 2022-10-23 NOTE — H&P (Signed)
History and Physical    Courtney Brown [id number] DOB: 27-Oct-1940 DOA: 10/23/2022  PCP: Janith Lima, MD  Patient coming from: Home  I have personally briefly reviewed patient's old medical records in [place]  Chief Complaint: Shortness of breath  HPI: Courtney Brown is a 82 y.o. female with medical history significant for COPD, HTN, HLD, CVA, anxiety, iron deficiency anemia who presented to the ED for evaluation of shortness of breath.  Patient states over the last months she has been having right upper chest and right shoulder discomfort.  She thought this was due to sleeping on her right side.  Over this time she has also developed some shortness of breath which she thought was related to her COPD.  She has had cough and chest congestion with occasional sputum production.  She has then been experiencing pain in her back below her right shoulder blade as well as mid right lateral chest wall/rib area.  She has had some weight loss which she attributes to decreased appetite.  She denies any fevers, chills, diaphoresis.  ED Course  Labs/Imaging on admission: I have personally reviewed following labs and imaging studies.  Initial vitals showed BP 147/64, pulse 113, RR 16, temp 97.6 F, SpO2 100% on room air.  Labs show WBC 17.8, hemoglobin 10.1, platelets 723,000, sodium 137, potassium 4.4, bicarb 25, BUN 17, creatinine 0.64, serum glucose 114, troponin 3.  COVID, influenza, RSV PCR negative.  2 view chest x-ray showed large rounded mass in the right upper lobe.  CTA chest showed changes consistent with central occluding neoplasm in the right upper lobe with likely peripheral consolidation.  Bony suspected metastatic disease is noted within the right fourth rib laterally as well as spiculated densities in the left upper lobe which may represent metastatic disease.  No evidence of pulmonary emboli.  Patient was given IV ceftriaxone and azithromycin.  EDP discussed with  on-call oncology, Dr. Benay Spice, who recommended admission for further workup and management.  The hospitalist service was consulted to admit for further evaluation and management.  Review of Systems: All systems reviewed and are negative except as documented in history of present illness above.   Past Medical History:  Diagnosis Date   Allergy    Anxiety    Arthritis    Cancer ([place])    hx of melanoma    Colon polyps    COPD (chronic obstructive pulmonary disease) ([place])    Coronary artery disease    Dyspnea    with exertion    Edema    in lower extremties    Emphysema of lung ([facility])    GERD (gastroesophageal reflux disease)    H/O hiatal hernia    Hyperlipidemia    Hypertension    Inverted nipple    bilateral, pt says that's normal   Myocardial infarct, old 2008   no stent placed   Osteoporosis    Pneumonia    hx of    Sepsis ([place])    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 Her smoking use included cigarettes. She has a 66.00 pack-year smoking history. She has never used smokeless tobacco. She reports current alcohol use of about 2.0 standard drinks of alcohol per week. She reports that she does not use drugs.  Allergies  Allergen Reactions   Prednisone Other (See Comments)    Leg pain,light headed   Statins     Myalgias- per patient, she feels better off the medication     Family History  Problem Relation Age of Onset   Hearing loss Father    Hearing loss Maternal Aunt    Hearing loss Maternal Grandmother    Cancer Maternal Grandmother        colon   Arthritis Mother    Cancer Paternal Grandmother        colon   Breast cancer Neg Hx      Prior to Admission medications   Medication Sig Start Date End Date Taking? Authorizing Provider  acetaminophen (TYLENOL) 500 MG tablet Take 500 mg by mouth every 6 (six) hours as needed for mild pain or moderate pain.    [provider]  albuterol (VENTOLIN HFA) 108 (90 Base) MCG/ACT inhaler INHALE 1-2 PUFFS BY MOUTH EVERY 6 HOURS AS NEEDED FOR WHEEZE OR SHORTNESS OF BREATH 10/13/22   Janith Lima, MD  ALPRAZolam (XANAX) 0.25 MG tablet TAKE 1 TABLET BY MOUTH 2 TIMES DAILY AS NEEDED FOR ANXIETY. 10/06/22   Janith Lima, MD  aspirin 81 MG tablet Take 81 mg by mouth at bedtime.    [provider]  budesonide-formoterol (SYMBICORT) 160-4.5 MCG/ACT inhaler TAKE 2 PUFFS BY MOUTH TWICE A DAY 06/06/22   Janith Lima, MD  Calcium Citrate-Vitamin D (CITRACAL + D PO) Take 1 tablet by mouth 2 (two) times daily. 600 mg calcium    [provider]  colesevelam (WELCHOL) 625 MG tablet TAKE 1 TABLET (625 MG TOTAL) BY MOUTH 2 (TWO) TIMES DAILY WITH A MEAL. 07/16/22   Janith Lima, MD  Cyanocobalamin (B-12 COMPLIANCE INJECTION IJ) Inject as directed. One time a month    [provider]  cyanocobalamin (VITAMIN B12) 1000 MCG tablet Take 1 tablet (1,000 mcg total) by mouth daily. 08/15/22 08/10/23  Barbee Cough, MD  fenofibrate (TRICOR) 145 MG tablet TAKE 1 TABLET BY MOUTH EVERY DAY 08/01/22   Janith Lima, MD  Ferric Maltol (ACCRUFER) 30 MG CAPS Take 1 capsule by mouth in the morning and at bedtime. 02/26/22   Janith Lima, MD  folic acid (FOLVITE) 1 MG tablet TAKE 1 TABLET BY MOUTH EVERY DAY 08/24/22   Janith Lima, MD  furosemide (LASIX) 20 MG tablet Take 1 tablet (20 mg total) by  mouth daily. 06/06/22   Janith Lima, MD  gabapentin (NEURONTIN) 100 MG capsule Take 2 capsules (200 mg total) by mouth 2 (two) times daily. 09/24/22   Lyndal Pulley, DO  Garlic Oil 123XX123 MG CAPS Take 1,000 mg by mouth daily.    [provider]  Lactobacillus (PROBIOTIC ACIDOPHILUS) CAPS Take 1 tablet by mouth daily.    [provider]  loratadine (CLARITIN) 10 MG tablet TAKE 1 TABLET BY MOUTH EVERY DAY 05/01/22   Janith Lima, MD  magnesium oxide (MAG-OX) 400 (240 Mg) MG tablet TAKE 1 TABLET BY MOUTH TWICE A  DAY 09/05/21   Janith Lima, MD  metoprolol succinate (TOPROL-XL) 50 MG 24 hr tablet Take 1 tablet (50 mg total) by mouth daily. 09/09/22   Janith Lima, MD  mometasone (NASONEX) 50 MCG/ACT nasal spray PLACE 2 SPRAYS INTO THE NOSE DAILY. 10/31/21   Janith Lima, MD  omeprazole (PRILOSEC) 20 MG capsule TAKE 1 CAPSULE BY MOUTH EVERY DAY 07/07/22   Janith Lima, MD  primidone (MYSOLINE) 50 MG tablet Take 1 tablet (50 mg total) by mouth at bedtime. 08/22/22 08/17/23  Shellia Carwin, MD  raloxifene (EVISTA) 60 MG tablet TAKE 1 TABLET BY MOUTH EVERY DAY 05/21/22   Janith Lima, MD  Tiotropium Bromide Monohydrate (SPIRIVA RESPIMAT) 2.5 MCG/ACT AERS INHALE 2 PUFFS BY MOUTH PER DAY 10/06/22   Janith Lima, MD  triamcinolone cream (KENALOG) 0.1 % Apply 1 application topically 2 (two) times daily. Patient taking differently: Apply 1 application  topically daily as needed (yeast infection under breast). 09/01/17   Margot Ables, NP  Zinc 50 MG TABS TAKE 1 TABLET BY MOUTH EVERY DAY 08/24/22   Janith Lima, MD  zinc gluconate 50 MG tablet Take 1 tablet (50 mg total) by mouth daily. 03/01/22   Janith Lima, MD    Physical Exam: Vitals:   10/23/22 1600 10/23/22 1900 10/23/22 1915 10/23/22 2030  BP:  (!) 143/46    Pulse:  100    Resp:  19 (!) 21   Temp:    98 F (36.7 C)  TempSrc:      SpO2:  99%    Weight: 67.1 kg     Height: 5' (1.524 m)       Constitutional: Resting in bed, NAD, calm, comfortable Eyes: EOMI, lids and conjunctivae normal ENMT: Mucous membranes are moist. Posterior pharynx clear of any exudate or lesions.Normal dentition.  Neck: normal, supple, no masses. Respiratory: Slightly diminished breath sounds right upper lung field otherwise clear to auscultation. Normal respiratory effort. No accessory muscle use.  Cardiovascular: Regular rate and rhythm, no murmurs / rubs / gallops. No extremity edema. 2+ pedal pulses. Abdomen: no tenderness, no masses palpated. Musculoskeletal: no clubbing / cyanosis. No joint deformity upper and lower extremities. Good ROM, no contractures. Normal muscle tone.  Skin: no rashes, lesions, ulcers. No induration Neurologic: Sensation intact. Strength 5/5 in all 4.  Psychiatric: Normal judgment and insight. Alert and oriented x 3. Normal mood.   EKG: Personally reviewed. Sinus tachycardia, rate 109, LAFB.  Rate is faster when compared to previous.  Assessment/Plan Principal Problem:   Mass of upper lobe of right lung Active Problems:   COPD GOLD II   Deficiency anemia   HTN (hypertension), benign   Adjustment disorder with mixed anxiety and depressed mood   Thrombocytosis   Courtney Brown is a 82 y.o. female with medical history significant for COPD, HTN, HLD, CVA, anxiety, iron deficiency anemia who is admitted with new finding of right upper lobe mass suspicious for neoplasm and associated pneumonia.  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  There is also suspected metastatic disease to the right fourth rib and possibly in the left upper lobe.  She has been started on empiric antibiotics given leukocytosis and imaging concerning for associated pneumonia.  Patient is aware of CT  findings. -Oncology to consult for further recommendations regarding workup  of suspected neoplasm -Continue empiric IV ceftriaxone and azithromycin  COPD: Stable without wheezing or hypoxia on admission.  Continue albuterol as needed.  Multifactorial normocytic anemia: Has chronic anemia due to deficiencies in iron, B12, folate, zinc.  Hemoglobin is stable.  Continue supplements.  Hypertension: Continue Toprol-XL 50 mg daily.  Thrombocytosis: Likely reactive thrombocytosis.  Continue to monitor.  Anxiety/depression: Continue Xanax as needed.   DVT prophylaxis: enoxaparin (LOVENOX) injection 40 mg Start: 10/23/22 2200 Code Status: Full code, discussed with patient on admission. Family Communication: Discussed with patient, she has discussed with family Disposition Plan: From home and likely discharge to home pending clinical progress. Consults called: Oncology to consult Severity of Illness: The appropriate patient status for this patient is INPATIENT. Inpatient status is judged to be reasonable and necessary in order to provide the required intensity of service to ensure the patient's safety. The patient's presenting symptoms, physical exam findings, and initial radiographic and laboratory data in the context of their chronic comorbidities is felt to place them at high risk for further clinical deterioration. Furthermore, it is not anticipated that the patient will be medically stable for discharge from the hospital within 2 midnights of admission.   * I certify that at the point of admission it is my clinical judgment that the patient will require inpatient hospital care spanning beyond 2 midnights from the point of admission due to high intensity of service, high risk for further deterioration and high frequency of surveillance required.Zada Finders MD [facility]  If 7PM-7AM, please contact night-coverage [email or web address]  10/23/2022, 9:29 PM

## 2022-10-23 NOTE — ED Notes (Signed)
ED TO INPATIENT HANDOFF REPORT  Name/Age/Gender Courtney Brown 82 y.o. female  Code Status Code Status History     Date Active Date Inactive Code Status Order ID Comments User Context   10/26/2020 1944 10/30/2020 2252 Full Code [id number]  Kinsinger, [name], MD Inpatient   11/25/2016 1444 11/29/2016 2133 Full Code [id number]  Robbie Lis, MD Inpatient       Home/SNF/Other Home  Chief Complaint Mass of upper lobe of right lung [R91.8]  Level of Care/Admitting Diagnosis ED Disposition     ED Disposition  Admit   Condition  --   [place]: [facility] [[id number]]  Level of Care: Med-Surg [16]  May admit patient to [facility] or [facility] if equivalent level of care is available:: No  Covid Evaluation: Asymptomatic - no recent exposure (last 10 days) testing not required  Diagnosis: Mass of upper lobe of right lung [id number]  Admitting Physician: Lenore Cordia [id number]  Attending Physician: Lenore Cordia [id number]  Certification:: I certify this patient will need inpatient services for at least 2 midnights  Estimated Length of Stay: 2          Medical History Past Medical History:  Diagnosis Date   Allergy    Anxiety    Arthritis    Cancer ([place])    hx of melanoma    Colon polyps    COPD (chronic obstructive pulmonary disease) ([place])    Coronary artery disease    Dyspnea    with exertion    Edema    in lower extremties    Emphysema of lung ([facility])    GERD (gastroesophageal reflux disease)    H/O hiatal hernia    Hyperlipidemia    Hypertension    Inverted nipple    bilateral, pt says that's normal   Myocardial infarct, old 2008   no stent placed   Osteoporosis    Pneumonia    hx of    Sepsis ([place])    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    Stroke [facility])    TIA    Allergies Allergies  Allergen Reactions   Prednisone Other (See Comments)    Leg pain,light headed    Statins     Myalgias- per patient, she feels better off the medication    IV Location/Drains/Wounds Patient Lines/Drains/Airways Status     Active Line/Drains/Airways     Name Placement date Placement time Site Days   Peripheral IV 10/23/22 20 G Anterior;Right Forearm 10/23/22  1734  Forearm  less than 1   Incision (Closed) 10/26/20 Abdomen Other (Comment) 10/26/20  1725  -- 727   Incision - 3 Ports Abdomen Left;Upper Left;Mid Left;Lower --  --  -- --            Labs/Imaging Results for orders placed or performed during the hospital encounter of 10/23/22 (from the past 48 hour(s))  CBC     Status: Abnormal   Collection Time: 10/23/22  5:33 PM  Result Value Ref Range   WBC 17.8 (H) 4.0 - 10.5 K/uL   RBC 4.00 3.87 - 5.11 MIL/uL   Hemoglobin 10.1 (L) 12.0 - 15.0 g/dL   HCT 34.7 (L) 36.0 - 46.0 %   MCV 86.8 80.0 - 100.0 fL   MCH 25.3 (L) 26.0 - 34.0 pg   MCHC 29.1 (L) 30.0 - 36.0 g/dL   RDW 15.6 (H) 11.5 - 15.5 %  Platelets 723 (H) 150 - 400 K/uL   nRBC 0.0 0.0 - 0.2 %    Comment: Performed at [facility], [place] [address] [place], [place] [id number]  Basic metabolic panel     Status: Abnormal   Collection Time: 10/23/22  5:33 PM  Result Value Ref Range   Sodium 137 135 - 145 mmol/L   Potassium 4.4 3.5 - 5.1 mmol/L    Comment: HEMOLYSIS AT THIS LEVEL MAY AFFECT RESULT   Chloride 104 98 - 111 mmol/L   CO2 25 22 - 32 mmol/L   Glucose, Bld 114 (H) 70 - 99 mg/dL    Comment: Glucose reference range applies only to samples taken after fasting for at least 8 hours.   BUN 17 8 - 23 mg/dL   Creatinine, Ser 0.64 0.44 - 1.00 mg/dL   Calcium 9.6 8.9 - 10.3 mg/dL   GFR, Estimated >60 >60 mL/min    Comment: (NOTE) Calculated using the CKD-EPI Creatinine Equation (2021)    Anion gap 8 5 - 15    Comment: Performed at [facility], [place] [address], [place], [place] [address]  Troponin I (High Sensitivity)     Status: None   Collection Time:  10/23/22  5:33 PM  Result Value Ref Range   Troponin I (High Sensitivity) 3 <18 ng/L    Comment: (NOTE) Elevated high sensitivity troponin I (hsTnI) values and significant  changes across serial measurements may suggest ACS but many other  chronic and acute conditions are known to elevate hsTnI results.  Refer to the "Links" section for chest pain algorithms and additional  guidance. Performed at [facility], [place] [address], [place], [place] [address]   Resp panel by RT-PCR (RSV, Flu A&B, Covid) Anterior Nasal Swab     Status: None   Collection Time: 10/23/22  5:33 PM   Specimen: Anterior Nasal Swab  Result Value Ref Range   SARS Coronavirus 2 by RT PCR NEGATIVE NEGATIVE    Comment: (NOTE) SARS-CoV-2 target nucleic acids are NOT DETECTED.  The SARS-CoV-2 RNA is generally detectable in upper respiratory specimens during the acute phase of infection. The lowest concentration of SARS-CoV-2 viral copies this assay can detect is 138 copies/mL. A negative result does not preclude SARS-Cov-2 infection and should not be used as the sole basis for treatment or other patient management decisions. A negative result may occur with  improper specimen collection/handling, submission of specimen other than nasopharyngeal swab, presence of viral mutation(s) within the areas targeted by this assay, and inadequate number of viral copies(<138 copies/mL). A negative result must be combined with clinical observations, patient history, and epidemiological information. The expected result is Negative.  Fact Sheet for Patients:  EntrepreneurPulse.com.au  Fact Sheet for Healthcare Providers:  IncredibleEmployment.be  This test is no t yet approved or cleared by the [place] FDA and  has been authorized for detection and/or diagnosis of SARS-CoV-2 by FDA under an Emergency Use Authorization (EUA). This EUA will remain  in effect (meaning  this test can be used) for the duration of the COVID-19 declaration under Section 564(b)(1) of the Act, 21 U.S.C.section 360bbb-3(b)(1), unless the authorization is terminated  or revoked sooner.       Influenza A by PCR NEGATIVE NEGATIVE   Influenza B by PCR NEGATIVE NEGATIVE    Comment: (NOTE) The Xpert Xpress SARS-CoV-2/FLU/RSV plus assay is intended as an aid in the diagnosis of influenza from Nasopharyngeal swab specimens and should not be  used as a sole basis for treatment. Nasal washings and aspirates are unacceptable for Xpert Xpress SARS-CoV-2/FLU/RSV testing.  Fact Sheet for Patients: EntrepreneurPulse.com.au  Fact Sheet for Healthcare Providers: IncredibleEmployment.be  This test is not yet approved or cleared by the [place] FDA and has been authorized for detection and/or diagnosis of SARS-CoV-2 by FDA under an Emergency Use Authorization (EUA). This EUA will remain in effect (meaning this test can be used) for the duration of the COVID-19 declaration under Section 564(b)(1) of the Act, 21 U.S.C. section 360bbb-3(b)(1), unless the authorization is terminated or revoked.     Resp Syncytial Virus by PCR NEGATIVE NEGATIVE    Comment: (NOTE) Fact Sheet for Patients: EntrepreneurPulse.com.au  Fact Sheet for Healthcare Providers: IncredibleEmployment.be  This test is not yet approved or cleared by the [place] FDA and has been authorized for detection and/or diagnosis of SARS-CoV-2 by FDA under an Emergency Use Authorization (EUA). This EUA will remain in effect (meaning this test can be used) for the duration of the COVID-19 declaration under Section 564(b)(1) of the Act, 21 U.S.C. section 360bbb-3(b)(1), unless the authorization is terminated or revoked.  Performed at [facility], [place] [address] [place], [place] [address]    CT Angio Chest PE W and/or Wo  Contrast  Result Date: 10/23/2022 CLINICAL DATA:  Large right upper lobe mass EXAM: CT ANGIOGRAPHY CHEST WITH CONTRAST TECHNIQUE: Multidetector CT imaging of the chest was performed using the standard protocol during bolus administration of intravenous contrast. Multiplanar CT image reconstructions and MIPs were obtained to evaluate the vascular anatomy. RADIATION DOSE REDUCTION: This exam was performed according to the departmental dose-optimization program which includes automated exposure control, adjustment of the mA and/or kV according to patient size and/or use of iterative reconstruction technique. CONTRAST:  13mL OMNIPAQUE IOHEXOL 350 MG/ML SOLN COMPARISON:  Chest x-ray from earlier in the same day. FINDINGS: Cardiovascular: Atherosclerotic calcifications of the aorta are noted without aneurysmal dilatation. The pulmonary artery shows a normal branching pattern bilaterally. No defect to suggest pulmonary embolism is noted. Cardiac shadow is at the upper limits of normal size. Mediastinum/Nodes: Thoracic inlet is within normal limits. Small right paratracheal and subcarinal lymph nodes are noted but not significant by size criteria. No left hilar adenopathy is noted. The right hilum is somewhat obscured due to central soft tissue density. The esophagus as visualized is within normal limits. Lungs/Pleura: Diffuse emphysematous changes are noted. Some spiculated appearing areas are noted in the anterior aspect of the left upper lobe best seen on image number 30 and 31 of series 11. The largest of these measures up to 11 mm. Small right-sided pleural effusion is noted. Mild right basilar atelectasis is seen. There is a large geographic area of soft tissue density identified in the right upper lobe with mass effect upon the major fissure. The majority of the right upper lobe bronchial branches are occluded centrally likely related to central hilar mass. There is slight increased density noted adjacent to the  bronchial occlusion which measures approximately 3 cm in greatest dimension and consistent with underlying mass. It is difficult to assess how much of the more peripheral consolidation is related to mass or collapsed lung. Upper Abdomen: Scattered simple cysts are noted within the kidneys. No follow-up is recommended. No other focal abnormality is noted the upper abdomen. Musculoskeletal: Lytic lesion is noted in the lateral aspect of the right fourth rib consistent with metastatic disease. No pathologic fracture is seen. No other rib abnormality is noted.  No other definitive lytic or sclerotic lesions are seen. Review of the MIP images confirms the above findings. 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 Referral to multi disciplinary pulmonary team is recommended. Tissue sampling and likely PET-CT are also recommended for further evaluation. 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 Spiculated densities in the left upper lobe which may represent metastatic disease. No evidence of pulmonary emboli. Aortic Atherosclerosis (ICD10-I70.0) and Emphysema (ICD10-J43.9). Electronically Signed   By: Inez Catalina M.D.   On: 10/23/2022 19:51   DG Chest 2 View  Result Date: 10/23/2022 CLINICAL DATA:  Shortness of breath. EXAM: CHEST - 2 VIEW COMPARISON:  January 18, 2018. FINDINGS: The heart size and mediastinal contours are within normal limits. Left lung is clear. Large rounded mass is noted in right upper lobe highly concerning for malignancy, with probable associated postobstructive atelectasis. Minimal right pleural effusion is noted. The visualized skeletal structures are unremarkable. IMPRESSION: Large rounded mass is noted in right upper lobe highly concerning for malignancy. CT scan of the chest is recommended for further evaluation. Electronically Signed   By: Marijo Conception M.D.   On: 10/23/2022 16:39     Pending Labs Unresulted Labs (From admission, onward)    None       Vitals/Pain Today's Vitals   10/23/22 1900 10/23/22 1915 10/23/22 2030 10/23/22 2039  BP: (!) 143/46     Pulse: 100     Resp: 19 (!) 21    Temp:   98 F (36.7 C)   TempSrc:      SpO2: 99%     Weight:      Height:      PainSc:    0-No pain    Isolation Precautions No active isolations  Medications Medications  azithromycin (ZITHROMAX) 500 mg in sodium chloride 0.9 % 250 mL IVPB (500 mg Intravenous New Bag/Given 10/23/22 2028)  oxyCODONE-acetaminophen (PERCOCET/ROXICET) 5-325 MG per tablet 1 tablet (1 tablet Oral Given 10/23/22 1734)  iohexol (OMNIPAQUE) 350 MG/ML injection 100 mL (100 mLs Intravenous Contrast Given 10/23/22 1919)  cefTRIAXone (ROCEPHIN) 1 g in sodium chloride 0.9 % 100 mL IVPB (1 g Intravenous New Bag/Given 10/23/22 2028)    Mobility walks with device

## 2022-10-23 NOTE — ED Provider Notes (Signed)
[facility] Provider Note   CSN: [id number] Arrival date & time: 10/23/22  1551     History  Chief Complaint  Patient presents with   Shortness of Breath    Devoria Scheier is a 82 y.o. 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  Patient concerned about fluid building up in her right lung.  She reports symptoms for around 1 to 2 weeks, with production of sputum, cough.  She denies fever, chills, reports the pain in the lung occasionally worse with respiration but not always.  She denies any recent travel.  She denies any previous history of blood clots, she is not currently taking any blood thinners.   Shortness of Breath      Home Medications Prior to Admission medications   Medication Sig Start Date End Date Taking? Authorizing Provider  acetaminophen (TYLENOL) 500 MG tablet Take 500 mg by mouth every 6 (six) hours as needed for mild pain or moderate pain.   Yes [provider]  albuterol (VENTOLIN HFA) 108 (90 Base) MCG/ACT inhaler INHALE 1-2 PUFFS BY MOUTH EVERY 6 HOURS AS NEEDED FOR WHEEZE OR SHORTNESS OF BREATH 10/13/22  Yes Janith Lima, MD  ALPRAZolam (XANAX) 0.25 MG tablet TAKE 1 TABLET BY MOUTH 2 TIMES DAILY AS NEEDED FOR ANXIETY. Patient taking differently: Take 0.25 mg by mouth at bedtime. 10/06/22  Yes Janith Lima, MD  aspirin 81 MG tablet Take 81 mg by mouth at bedtime.   Yes [provider]  budesonide-formoterol (SYMBICORT) 160-4.5 MCG/ACT inhaler TAKE 2 PUFFS BY MOUTH TWICE A DAY 06/06/22  Yes Janith Lima, MD  Calcium Citrate-Vitamin D (CITRACAL + D PO) Take 1 tablet by mouth in the morning. 600 mg calcium   Yes [provider]  colesevelam  (WELCHOL) 625 MG tablet TAKE 1 TABLET (625 MG TOTAL) BY MOUTH 2 (TWO) TIMES DAILY WITH A MEAL. 07/16/22  Yes Janith Lima, MD  cyanocobalamin (VITAMIN B12) 1000 MCG tablet Take 1 tablet (1,000 mcg total) by mouth daily. 08/15/22 08/10/23 Yes Barbee Cough, MD  fenofibrate (TRICOR) 145 MG tablet TAKE 1 TABLET BY MOUTH EVERY DAY 08/01/22  Yes Janith Lima, MD  Ferric Maltol (ACCRUFER) 30 MG CAPS Take 1 capsule by mouth in the morning and at bedtime. 02/26/22  Yes Janith Lima, MD  folic acid (FOLVITE) 1 MG tablet TAKE 1 TABLET BY MOUTH EVERY DAY 08/24/22  Yes Janith Lima, MD  furosemide (LASIX) 20 MG tablet Take 1 tablet (20 mg total) by mouth daily. 06/06/22  Yes Janith Lima, MD  gabapentin (NEURONTIN) 100 MG capsule Take 2 capsules (200 mg total) by mouth 2 (two) times daily. 09/24/22   Lyndal Pulley, DO  Garlic Oil 123XX123 MG CAPS Take 1,000 mg by mouth daily.    [provider]  Lactobacillus (PROBIOTIC ACIDOPHILUS) CAPS Take 1 tablet by mouth daily.    [provider]  loratadine (CLARITIN) 10 MG tablet TAKE 1 TABLET BY MOUTH EVERY DAY 05/01/22   Janith Lima, MD  magnesium oxide (MAG-OX) 400 (240 Mg) MG tablet TAKE 1 TABLET BY MOUTH TWICE A DAY 09/05/21   Janith Lima, MD  metoprolol succinate (TOPROL-XL) 50 MG 24 hr tablet Take 1 tablet (50 mg total) by  mouth daily. 09/09/22   Janith Lima, MD  mometasone (NASONEX) 50 MCG/ACT nasal spray PLACE 2 SPRAYS INTO THE NOSE DAILY. 10/31/21   Janith Lima, MD  omeprazole (PRILOSEC) 20 MG capsule TAKE 1 CAPSULE BY MOUTH EVERY DAY 07/07/22   Janith Lima, MD  primidone (MYSOLINE) 50 MG tablet Take 1 tablet (50 mg total) by mouth at bedtime. 08/22/22 08/17/23  Shellia Carwin, MD  raloxifene (EVISTA) 60 MG tablet TAKE 1 TABLET BY MOUTH EVERY DAY 05/21/22   Janith Lima, MD  Tiotropium Bromide Monohydrate (SPIRIVA RESPIMAT) 2.5 MCG/ACT AERS INHALE 2 PUFFS BY MOUTH PER DAY 10/06/22   Janith Lima, MD  triamcinolone  cream (KENALOG) 0.1 % Apply 1 application topically 2 (two) times daily. Patient taking differently: Apply 1 application  topically daily as needed (yeast infection under breast). 09/01/17   Margot Ables, NP  Zinc 50 MG TABS TAKE 1 TABLET BY MOUTH EVERY DAY 08/24/22   Janith Lima, MD  zinc gluconate 50 MG tablet Take 1 tablet (50 mg total) by mouth daily. 03/01/22   Janith Lima, MD      Allergies    Prednisone and Statins    Review of Systems   Review of Systems  Respiratory:  Positive for shortness of breath.   All other systems reviewed and are negative.   Physical Exam Updated Vital Signs BP (!) 143/46   Pulse 100   Temp 98 F (36.7 C)   Resp (!) 21   Ht 5' (1.524 m)   Wt 67.1 kg   SpO2 99%   BMI 28.90 kg/m  Physical Exam Vitals and nursing note reviewed.  Constitutional:      General: She is not in acute distress.    Appearance: Normal appearance.  HENT:     Head: Normocephalic and atraumatic.  Eyes:     General:        Right eye: No discharge.        Left eye: No discharge.  Cardiovascular:     Rate and Rhythm: Regular rhythm. Tachycardia present.     Heart sounds: No murmur heard.    No friction rub. No gallop.  Pulmonary:     Effort: Pulmonary effort is normal.     Breath sounds: Normal breath sounds.     Comments: Mild to moderate rhonchi in right lower lung fields, question focal consolidation in middle to lower lobe. No wheezing, stridor, rales Abdominal:     General: Bowel sounds are normal.     Palpations: Abdomen is soft.  Skin:    General: Skin is warm and dry.     Capillary Refill: Capillary refill takes less than 2 seconds.  Neurological:     Mental Status: She is alert and oriented to person, place, and time.  Psychiatric:        Mood and Affect: Mood normal.        Behavior: Behavior normal.     ED Results / Procedures / Treatments   Labs (all labs ordered are listed, but only abnormal results are displayed) Labs  Reviewed  CBC - Abnormal; Notable for the following components:      Result Value   WBC 17.8 (*)    Hemoglobin 10.1 (*)    HCT 34.7 (*)    MCH 25.3 (*)    MCHC 29.1 (*)    RDW 15.6 (*)    Platelets 723 (*)    All other components within normal limits  BASIC METABOLIC PANEL - Abnormal; Notable for the following components:   Glucose, Bld 114 (*)    All other components within normal limits  RESP PANEL BY RT-PCR (RSV, FLU A&B, COVID)  [id number]  BASIC METABOLIC PANEL  CBC WITH DIFFERENTIAL/PLATELET  TROPONIN I (HIGH SENSITIVITY)    EKG EKG Interpretation  Date/Time:  Thursday October 23 2022 16:01:42 EDT Ventricular Rate:  109 PR Interval:  177 QRS Duration: 110 QT Interval:  331 QTC Calculation: 446 R Axis:   -51 Text Interpretation: Sinus tachycardia Left anterior fascicular block Abnormal R-wave progression, late transition Since last tracing rate faster Confirmed by Isla Pence [phone]) on 10/23/2022 5:26:01 PM  Radiology CT Angio Chest PE W and/or Wo Contrast  Result Date: 10/23/2022 CLINICAL DATA:  Large right upper lobe mass EXAM: CT ANGIOGRAPHY CHEST WITH CONTRAST TECHNIQUE: Multidetector CT imaging of the chest was performed using the standard protocol during bolus administration of intravenous contrast. Multiplanar CT image reconstructions and MIPs were obtained to evaluate the vascular anatomy. RADIATION DOSE REDUCTION: This exam was performed according to the departmental dose-optimization program which includes automated exposure control, adjustment of the mA and/or kV according to patient size and/or use of iterative reconstruction technique. CONTRAST:  155mL OMNIPAQUE IOHEXOL 350 MG/ML SOLN COMPARISON:  Chest x-ray from earlier in the same day. FINDINGS: Cardiovascular: Atherosclerotic calcifications of the aorta are noted without aneurysmal dilatation. The pulmonary artery shows a normal branching pattern bilaterally. No defect to suggest pulmonary embolism is noted.  Cardiac shadow is at the upper limits of normal size. Mediastinum/Nodes: Thoracic inlet is within normal limits. Small right paratracheal and subcarinal lymph nodes are noted but not significant by size criteria. No left hilar adenopathy is noted. The right hilum is somewhat obscured due to central soft tissue density. The esophagus as visualized is within normal limits. Lungs/Pleura: Diffuse emphysematous changes are noted. Some spiculated appearing areas are noted in the anterior aspect of the left upper lobe best seen on image number 30 and 31 of series 11. The largest of these measures up to 11 mm. Small right-sided pleural effusion is noted. Mild right basilar atelectasis is seen. There is a large geographic area of soft tissue density identified in the right upper lobe with mass effect upon the major fissure. The majority of the right upper lobe bronchial branches are occluded centrally likely related to central hilar mass. There is slight increased density noted adjacent to the bronchial occlusion which measures approximately 3 cm in greatest dimension and consistent with underlying mass. It is difficult to assess how much of the more peripheral consolidation is related to mass or collapsed lung. Upper Abdomen: Scattered simple cysts are noted within the kidneys. No follow-up is recommended. No other focal abnormality is noted the upper abdomen. Musculoskeletal: Lytic lesion is noted in the lateral aspect of the right fourth rib consistent with metastatic disease. No pathologic fracture is seen. No other rib abnormality is noted. No other definitive lytic or sclerotic lesions are seen. Review of the MIP images confirms the above findings. 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 Referral to multi disciplinary pulmonary team is recommended. Tissue sampling and likely PET-CT are also recommended for further evaluation. 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 Spiculated densities in the left upper lobe which may represent metastatic disease. No evidence of pulmonary emboli. Aortic Atherosclerosis (ICD10-I70.0) and Emphysema (ICD10-J43.9). Electronically Signed   By: Linus Mako.D.  On: 10/23/2022 19:51   DG Chest 2 View  Result Date: 10/23/2022 CLINICAL DATA:  Shortness of breath. EXAM: CHEST - 2 VIEW COMPARISON:  January 18, 2018. FINDINGS: The heart size and mediastinal contours are within normal limits. Left lung is clear. Large rounded mass is noted in right upper lobe highly concerning for malignancy, with probable associated postobstructive atelectasis. Minimal right pleural effusion is noted. The visualized skeletal structures are unremarkable. IMPRESSION: Large rounded mass is noted in right upper lobe highly concerning for malignancy. CT scan of the chest is recommended for further evaluation. Electronically Signed   By: Marijo Conception M.D.   On: 10/23/2022 16:39    Procedures Procedures    Medications Ordered in ED Medications  azithromycin (ZITHROMAX) 500 mg in sodium chloride 0.9 % 250 mL IVPB (500 mg Intravenous New Bag/Given 10/23/22 2028)  cefTRIAXone (ROCEPHIN) 1 g in sodium chloride 0.9 % 100 mL IVPB (has no administration in time range)  enoxaparin (LOVENOX) injection 40 mg (has no administration in time range)  acetaminophen (TYLENOL) tablet 650 mg (has no administration in time range)    Or  acetaminophen (TYLENOL) suppository 650 mg (has no administration in time range)  HYDROcodone-acetaminophen (NORCO/VICODIN) 5-325 MG per tablet 1-2 tablet (has no administration in time range)  ondansetron (ZOFRAN) tablet 4 mg (has no administration in time range)    Or  ondansetron (ZOFRAN) injection 4 mg (has no administration in time range)  guaiFENesin (MUCINEX) 12 hr tablet 600 mg (has no administration in time range)  albuterol (PROVENTIL) (2.5 MG/3ML) 0.083% nebulizer  solution 2.5 mg (has no administration in time range)  ALPRAZolam (XANAX) tablet 0.25 mg (has no administration in time range)  cyanocobalamin (VITAMIN B12) tablet 1,000 mcg (has no administration in time range)  folic acid (FOLVITE) tablet 1 mg (has no administration in time range)  gabapentin (NEURONTIN) capsule 200 mg (has no administration in time range)  metoprolol succinate (TOPROL-XL) 24 hr tablet 50 mg (has no administration in time range)  pantoprazole (PROTONIX) EC tablet 40 mg (has no administration in time range)  primidone (MYSOLINE) tablet 50 mg (has no administration in time range)  Tiotropium Bromide Monohydrate AERS (has no administration in time range)  zinc sulfate capsule 220 mg (has no administration in time range)  ferrous sulfate tablet 325 mg (has no administration in time range)  oxyCODONE-acetaminophen (PERCOCET/ROXICET) 5-325 MG per tablet 1 tablet (1 tablet Oral Given 10/23/22 1734)  iohexol (OMNIPAQUE) 350 MG/ML injection 100 mL (100 mLs Intravenous Contrast Given 10/23/22 1919)  cefTRIAXone (ROCEPHIN) 1 g in sodium chloride 0.9 % 100 mL IVPB (0 g Intravenous Stopped 10/23/22 2110)    ED Course/ Medical Decision Making/ A&P                             Medical Decision Making Amount and/or Complexity of Data Reviewed Labs: ordered. Radiology: ordered.  Risk Prescription drug management.   This patient is a 82 y.o. female who presents to the ED for concern of shob, cough, this involves an extensive number of treatment options, and is a complaint that carries with it a high risk of complications and morbidity. The emergent differential diagnosis prior to evaluation includes, but is not limited to,  asthma exacerbation, COPD exacerbation, acute upper respiratory infection, acute bronchitis, chronic bronchitis, interstitial lung disease, ARDS, PE, pneumonia, atypical ACS, carbon monoxide poisoning, spontaneous pneumothorax, new CHF vs CHF exacerbation, versus other .  This is  not an exhaustive differential.   Past Medical History / Co-morbidities / Social History: COPD, CAD, GERD, PVD, tobacco abuse  Additional history: Chart reviewed. Pertinent results include: reviewed outpatient neurology, family medicine notes  Physical Exam: Physical exam performed. The pertinent findings include: Patient with questionable focal consolidation, some scattered rhonchi on the right, she was tachycardic on arrival, which improved with some rest, she is mildly tachypneic, but has stable oxygen saturation on room air, she is afebrile.  Lab Tests: I ordered, and personally interpreted labs.  The pertinent results include: CBC notable for significant leukocytosis, white blood cell 17.8, platelets 723, suggesting acute phase reactant, and acute infection, RVP negative for COVID, flu, RSV, BMP unremarkable, initial troponin normal   Imaging Studies: I ordered imaging studies including plain film chest x-ray, CT angio PE without contrast. I independently visualized and interpreted imaging which showed spiculated mass skin right middle lobe, concerning for malignancy, possible metastatic lesion noted in right fourth rib. I agree with the radiologist interpretation.   Cardiac Monitoring:  The patient was maintained on a cardiac monitor.  My attending physician Dr. Gilford Raid viewed and interpreted the cardiac monitored which showed an underlying rhythm of: Sinus tachycardia, left anterior fascicular block. I agree with this interpretation.   Medications: I ordered medication including heart failure, azithromycin for concerning obstructive pneumonia on top of new malignancy, Percocet for pain.  Patient with some improvement of her pain, she will need reevaluation in the hospital to ensure her symptoms are improving, she will need pulmonology consult, oncology consult for concerning new malignancy  Consultations Obtained: I requested consultation with the oncologist, spoke with Dr.  Learta Codding, as well as spoke with the hospitalist, Dr. Posey Pronto And discussed lab and imaging findings as well as pertinent plan - they recommend: Admission for likely obstructive pneumonia in context of new malignancy, with need for new oncologic workup in the hospital at this time   Disposition: After consideration of the diagnostic results and the patients response to treatment, I feel that patient would benefit from admission for signs symptoms of pneumonia overlying new malignancy.   emergency department workup does not suggest an emergent condition requiring admission or immediate intervention beyond what has been performed at this time. The plan is: as above. The patient is safe for discharge and has been instructed to return immediately for worsening symptoms, change in symptoms or any other concerns.  I discussed this case with my attending physician Dr. Gilford Raid who cosigned this note including patient's presenting symptoms, physical exam, and planned diagnostics and interventions. Attending physician stated agreement with plan or made changes to plan which were implemented.    Final Clinical Impression(s) / ED Diagnoses Final diagnoses:  None    Rx / DC Orders ED Discharge Orders     None         Dorien Chihuahua 10/23/22 2157    Isla Pence, MD 10/23/22 2211

## 2022-10-24 DIAGNOSIS — R918 Other nonspecific abnormal finding of lung field: Secondary | ICD-10-CM | POA: Diagnosis not present

## 2022-10-24 LAB — CBC WITH DIFFERENTIAL/PLATELET
Abs Immature Granulocytes: 0.12 10*3/uL — ABNORMAL HIGH (ref 0.00–0.07)
Basophils Absolute: 0.1 10*3/uL (ref 0.0–0.1)
Basophils Relative: 0 %
Eosinophils Absolute: 0.6 10*3/uL — ABNORMAL HIGH (ref 0.0–0.5)
Eosinophils Relative: 4 %
HCT: 31.8 % — ABNORMAL LOW (ref 36.0–46.0)
Hemoglobin: 9.3 g/dL — ABNORMAL LOW (ref 12.0–15.0)
Immature Granulocytes: 1 %
Lymphocytes Relative: 12 %
Lymphs Abs: 1.9 10*3/uL (ref 0.7–4.0)
MCH: 25.1 pg — ABNORMAL LOW (ref 26.0–34.0)
MCHC: 29.2 g/dL — ABNORMAL LOW (ref 30.0–36.0)
MCV: 85.9 fL (ref 80.0–100.0)
Monocytes Absolute: 1.6 10*3/uL — ABNORMAL HIGH (ref 0.1–1.0)
Monocytes Relative: 10 %
Neutro Abs: 11.4 10*3/uL — ABNORMAL HIGH (ref 1.7–7.7)
Neutrophils Relative %: 73 %
Platelets: 638 10*3/uL — ABNORMAL HIGH (ref 150–400)
RBC: 3.7 MIL/uL — ABNORMAL LOW (ref 3.87–5.11)
RDW: 15.6 % — ABNORMAL HIGH (ref 11.5–15.5)
WBC: 15.7 10*3/uL — ABNORMAL HIGH (ref 4.0–10.5)
nRBC: 0 % (ref 0.0–0.2)

## 2022-10-24 LAB — BASIC METABOLIC PANEL
Anion gap: 7 (ref 5–15)
BUN: 15 mg/dL (ref 8–23)
CO2: 25 mmol/L (ref 22–32)
Calcium: 9.3 mg/dL (ref 8.9–10.3)
Chloride: 102 mmol/L (ref 98–111)
Creatinine, Ser: 0.5 mg/dL (ref 0.44–1.00)
GFR, Estimated: >60 mL/min (ref >60)
Glucose, Bld: 98 mg/dL (ref 70–99)
Potassium: 3.3 mmol/L — ABNORMAL LOW (ref 3.5–5.1)
Sodium: 134 mmol/L — ABNORMAL LOW (ref 135–145)

## 2022-10-24 LAB — GLUCOSE, CAPILLARY: Glucose-Capillary: 86 mg/dL (ref 70–99)

## 2022-10-24 MED ORDER — MOMETASONE FURO-FORMOTEROL FUM 200-5 MCG/ACT IN AERO
2.0000 | INHALATION_SPRAY | Freq: Two times a day (BID) | RESPIRATORY_TRACT | Status: DC
  Administered 2022-10-24 – 2022-10-25 (×3): 2 via RESPIRATORY_TRACT
  Filled 2022-10-24: qty 8.8

## 2022-10-24 MED ORDER — ALBUTEROL SULFATE (2.5 MG/3ML) 0.083% IN NEBU: 3.0000 mL | INHALATION_SOLUTION | RESPIRATORY_TRACT | Status: DC | PRN

## 2022-10-24 MED ORDER — AMOXICILLIN-POT CLAVULANATE 875-125 MG PO TABS
1.0000 | ORAL_TABLET | Freq: Two times a day (BID) | ORAL | Status: DC
  Administered 2022-10-24 – 2022-10-25 (×2): 1 via ORAL
  Filled 2022-10-24 (×2): qty 1

## 2022-10-24 MED ORDER — FLUTICASONE PROPIONATE 50 MCG/ACT NA SUSP
2.0000 | Freq: Every day | NASAL | Status: DC
  Administered 2022-10-24 – 2022-10-25 (×2): 2 via NASAL
  Filled 2022-10-24 (×2): qty 16

## 2022-10-24 MED ORDER — POTASSIUM CHLORIDE CRYS ER 20 MEQ PO TBCR
40.0000 meq | EXTENDED_RELEASE_TABLET | Freq: Every day | ORAL | Status: DC
  Administered 2022-10-24 – 2022-10-25 (×2): 40 meq via ORAL
  Filled 2022-10-24 (×2): qty 2

## 2022-10-24 MED ORDER — HYDROCODONE-ACETAMINOPHEN 5-325 MG PO TABS
1.0000 | ORAL_TABLET | ORAL | Status: DC | PRN
  Administered 2022-10-24 – 2022-10-25 (×3): 1 via ORAL
  Filled 2022-10-24 (×3): qty 1

## 2022-10-24 NOTE — Plan of Care (Signed)
Case and imaging reviewed with Dr Anselm Pancoast. Unable to offer image-guided biopsy of right fourth rib bony lesion today due to CT scanner being down today. Per Dr Anselm Pancoast and his discussion with treatment team, patient is being scheduled for bronchoscopy with right upper lobe endobronchial biopsies tentatively for Monday at [facility]. If biopsy of right bony lesion is still desired following this, IR will be happy to assist with planning a biopsy for next week. Please contact IR team with any questions or concerns. IR service will sign off at this time, but please do not hesitate to reach back out to IR team if biopsy is still needed next week.  Lura Em, PA-C 10/24/2022 12:25 PM

## 2022-10-24 NOTE — Consult Note (Signed)
NAME:  Courtney Brown, MRN:  [id number], DOB:  [birth year], LOS: 1 ADMISSION DATE:  10/23/2022, CONSULTATION DATE: 10/24/2022 REFERRING MD: Dr. Verlon Au, CHIEF COMPLAINT: Right upper lobe mass  History of Present Illness:  82 year old former smoker with a history of COPD, previously seen by Dr. Melvyn Novas and on Symbicort/Spiriva as an outpatient.  Also with CAD, GERD with a hiatal hernia, history melanoma, hyperlipidemia, hypertension, cerebrovascular disease. She has been experiencing some right shoulder, subscapular and upper right flank pain for just over a month.  Also with some increased cough and congestion for which she started taking Mucinex DM.  She has not seen any hemoptysis.  Presentation chest x-ray in the ED showed a large right upper lobe opacity concerning for malignancy.  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  PCCM consulted to discuss options for tissue diagnosis.  Pertinent  Medical History   Past Medical History:  Diagnosis Date   Allergy    Anxiety    Arthritis    Cancer ([place])    hx of melanoma    Colon polyps    COPD (chronic obstructive pulmonary disease) (HCC)    Coronary artery disease    Dyspnea    with exertion    Edema    in lower extremties    Emphysema of lung (HCC)    GERD (gastroesophageal reflux disease)    H/O hiatal hernia    Hyperlipidemia    Hypertension    Inverted nipple    bilateral, pt says that's normal   Myocardial infarct, old 2008   no stent placed   Osteoporosis    Pneumonia    hx of    Sepsis ([place])    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  Spiculated left upper lobe nodular opacities.  Lytic lesion in the lateral aspect of the right fourth rib without a pathologic fracture noted  Interim History / Subjective:  No dyspnea.  Minimal cough.  She is still having some right shoulder discomfort  Objective   Blood pressure (!) 147/56, pulse 88, temperature 98.4 F (36.9 C), temperature source Oral, resp. rate 16, height 5' (1.524 m), weight 62.2 kg, SpO2 98 %.        Intake/Output Summary (Last 24 hours) at 10/24/2022 1106 Last data filed at 10/24/2022 0055 Gross per 24 hour  Intake 580 ml  Output --  Net 580 ml   Filed Weights   10/23/22 1600 10/23/22 2343  Weight: 67.1 kg 62.2 kg    Examination: General: Pleasant woman laying in bed on room air, comfortable HENT: Oropharynx clear, no upper airway secretions Lungs: Decreased breath sounds, bronchial breath sounds on the right.  More clear on the left without wheezes or crackles Cardiovascular: Regular, distant without a murmur Abdomen: Nondistended, positive bowel sounds Extremities: No edema Neuro: Awake, alert, interacting appropriately.  She does have decreased hearing, hears better on the left.  Answers questions, moves all extremities. GU: Deferred  Resolved Hospital Problem list  Assessment & Plan:  Right upper lobe mass, left upper lobe nodules.  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 -Discussed with her the options for tissue diagnosis as soon as possible.  She does want to pursue diagnostics and here treatment options.  I recommended bronchoscopy with right upper lobe endobronchial biopsies.  Discussed the risks, benefits, rationale with her and she agrees.  Needle biopsy of her right fourth rib would also be an option but bony  sample would not be amenable to molecular testing. -Attempted to schedule today but unable.  Will work on trying to get this done as soon as possible, probably next week  COPD, without any evidence of acute exacerbation currently -Continue scheduled bronchodilator therapy, currently Incruse and Dulera -Pulmonary hygiene  Question postobstructive pneumonia.  No real cough, purulent sputum, fevers, etc. -Low threshold to change her IV antibiotics over to short course of oral antibiotics, either Augmentin or doxycycline    Labs   CBC: Recent Labs  Lab 10/23/22 1733 10/24/22 0324  WBC 17.8* 15.7*  NEUTROABS  --  11.4*  HGB 10.1* 9.3*  HCT 34.7* 31.8*  MCV 86.8 85.9  PLT 723* 638*    Basic Metabolic Panel: Recent Labs  Lab 10/23/22 1733 10/24/22 0324  NA 137 134*  K 4.4 3.3*  CL 104 102  CO2 25 25  GLUCOSE 114* 98  BUN 17 15  CREATININE 0.64 0.50  CALCIUM 9.6 9.3   GFR: Estimated Creatinine Clearance: 45.4 mL/min (by C-G formula based on SCr of 0.5 mg/dL). Recent Labs  Lab 10/23/22 1733 10/24/22 0324  WBC 17.8* 15.7*    Liver Function Tests: No results for input(s): "AST", "ALT", "ALKPHOS", "BILITOT", "PROT", "ALBUMIN" in the last 168 hours. No results for input(s): "LIPASE", "AMYLASE" in the last 168 hours. No results for input(s): "AMMONIA" in the last 168 hours.  ABG No results found for: "PHART", "PCO2ART", "PO2ART", "HCO3", "TCO2", "ACIDBASEDEF", "O2SAT"   Coagulation Profile: No results for input(s): "INR", "PROTIME" in the last 168 hours.  Cardiac Enzymes: No results for input(s): "CKTOTAL", "CKMB", "CKMBINDEX", "TROPONINI" in the last 168 hours.  HbA1C: Hgb A1c MFr Bld  Date/Time Value Ref Range Status  08/21/2021 03:52 PM 5.7 4.6 - 6.5 % Final    Comment:    Glycemic Control Guidelines for People with Diabetes:Non Diabetic:  <6%Goal of Therapy: <7%Additional Action Suggested:  >8%   01/30/2021 04:13 PM 6.0 4.6 - 6.5 % Final    Comment:     Glycemic Control Guidelines for People with Diabetes:Non Diabetic:  <6%Goal of Therapy: <7%Additional Action Suggested:  >8%     CBG: Recent Labs  Lab 10/24/22 0747  GLUCAP 86    Review of Systems:   As per HPI  Past Medical History:  She,  has a past medical history of Allergy, Anxiety, Arthritis, Cancer ([place]), Colon polyps, COPD (chronic obstructive pulmonary disease) ([place]), Coronary artery disease, Dyspnea, Edema, Emphysema of lung ([place]), GERD (gastroesophageal reflux disease), H/O hiatal hernia, Hyperlipidemia, Hypertension, Inverted nipple, Myocardial infarct, old (2008), Osteoporosis, Pneumonia, Sepsis ([place]), and Stroke ([place]).   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 Her smoking use included cigarettes. She has a 66.00 pack-year smoking history. She has never used smokeless tobacco. She reports current alcohol use of about 2.0 standard drinks of alcohol per week. She reports that she does not use drugs.   Family History:  Her family history includes Arthritis in her mother; Cancer in her maternal grandmother and paternal grandmother; Hearing loss in her father, maternal aunt, and maternal grandmother. There is no history of Breast cancer.   Allergies Allergies  Allergen Reactions   Prednisone Other (See Comments)    Leg pain,light headed   Statins     Myalgias- per patient, she feels better off the medication     Home Medications  Prior to Admission medications    Medication Sig Start Date End Date Taking? Authorizing Provider  acetaminophen (TYLENOL) 500 MG tablet Take 500 mg by mouth every 6 (six) hours as needed for mild pain or moderate pain.   Yes [provider]  albuterol (VENTOLIN HFA) 108 (90 Base) MCG/ACT inhaler INHALE 1-2 PUFFS BY MOUTH EVERY 6 HOURS AS NEEDED FOR WHEEZE OR SHORTNESS OF BREATH 10/13/22  Yes Janith Lima, MD  ALPRAZolam (XANAX) 0.25 MG tablet TAKE 1 TABLET BY MOUTH 2 TIMES DAILY AS NEEDED FOR ANXIETY. Patient taking differently: Take 0.25 mg by mouth at bedtime. 10/06/22  Yes Janith Lima, MD  aspirin 81 MG tablet Take 81 mg by mouth at bedtime.   Yes [provider]  budesonide-formoterol (SYMBICORT) 160-4.5 MCG/ACT inhaler TAKE 2 PUFFS BY MOUTH TWICE A DAY 06/06/22  Yes Janith Lima, MD  Calcium Citrate-Vitamin D (CITRACAL + D PO) Take 1 tablet by mouth in the morning. 600 mg calcium   Yes [provider]  colesevelam (WELCHOL) 625 MG tablet TAKE 1 TABLET (625 MG TOTAL) BY MOUTH 2 (TWO) TIMES DAILY WITH A MEAL. 07/16/22  Yes Janith Lima, MD  cyanocobalamin (VITAMIN B12) 1000 MCG tablet Take 1 tablet (1,000 mcg total) by mouth daily. 08/15/22 08/10/23 Yes Barbee Cough, MD  fenofibrate (TRICOR) 145 MG tablet TAKE 1 TABLET BY MOUTH EVERY DAY 08/01/22  Yes Janith Lima, MD  Ferric Maltol (ACCRUFER) 30 MG CAPS Take 1 capsule by mouth in the morning and at bedtime. 02/26/22  Yes Janith Lima, MD  folic acid (FOLVITE) 1 MG tablet TAKE 1 TABLET BY MOUTH EVERY DAY 08/24/22  Yes Janith Lima, MD  furosemide (LASIX) 20 MG tablet Take 1 tablet (20 mg total) by mouth daily. 06/06/22  Yes Janith Lima, MD  gabapentin (NEURONTIN) 100 MG capsule Take 2 capsules (200 mg total) by mouth 2 (two) times daily. Patient taking differently: Take 100-300 mg by mouth 2 (two) times daily. Take 1 capsule (100 mg) in the morning and Take 3 capsules (300 mg) at bedtime 09/24/22  Yes Hulan Saas M, DO   Garlic Oil 123XX123 MG CAPS Take 1,000 mg by mouth daily.   Yes [provider]  Lactobacillus (PROBIOTIC ACIDOPHILUS) CAPS Take 1 tablet by mouth daily.   Yes [provider]  loratadine (CLARITIN) 10 MG tablet TAKE 1 TABLET BY MOUTH EVERY DAY 05/01/22  Yes Janith Lima, MD  magnesium oxide (MAG-OX) 400 (240 Mg) MG tablet TAKE 1 TABLET BY MOUTH TWICE A DAY 09/05/21  Yes Scarlette Calico  L, MD  metoprolol succinate (TOPROL-XL) 50 MG 24 hr tablet Take 1 tablet (50 mg total) by mouth daily. 09/09/22  Yes Janith Lima, MD  mometasone (NASONEX) 50 MCG/ACT nasal spray PLACE 2 SPRAYS INTO THE NOSE DAILY. 10/31/21  Yes Janith Lima, MD  omeprazole (PRILOSEC) 20 MG capsule TAKE 1 CAPSULE BY MOUTH EVERY DAY 07/07/22  Yes Janith Lima, MD  primidone (MYSOLINE) 50 MG tablet Take 1 tablet (50 mg total) by mouth at bedtime. 08/22/22 08/17/23 Yes Shellia Carwin, MD  raloxifene (EVISTA) 60 MG tablet TAKE 1 TABLET BY MOUTH EVERY DAY 05/21/22  Yes Janith Lima, MD  Tiotropium Bromide Monohydrate (SPIRIVA RESPIMAT) 2.5 MCG/ACT AERS INHALE 2 PUFFS BY MOUTH PER DAY 10/06/22  Yes Janith Lima, MD  triamcinolone cream (KENALOG) 0.1 % Apply 1 application topically 2 (two) times daily. Patient taking differently: Apply 1 application  topically daily as needed (yeast infection under breast). 09/01/17  Yes Dragnev, Caesar Chestnut, NP  Zinc 50 MG TABS TAKE 1 TABLET BY MOUTH EVERY DAY 08/24/22  Yes Janith Lima, MD  zinc gluconate 50 MG tablet Take 1 tablet (50 mg total) by mouth daily. Patient not taking: Reported on 10/23/2022 03/01/22   Janith Lima, MD     Critical care time: NA     Baltazar Apo, MD, PhD 10/24/2022, 11:18 AM [facility] [phone] or if no answer before 7:00PM call [phone] For any issues after 7:00PM please call eLink [phone]

## 2022-10-24 NOTE — H&P (View-Only) (Signed)
NAME:  Courtney Brown, MRN:  [id number], DOB:  May 21, 1941, LOS: 1 ADMISSION DATE:  10/23/2022, CONSULTATION DATE: 10/24/2022 REFERRING MD: Dr. Verlon Au, CHIEF COMPLAINT: Right upper lobe mass  History of Present Illness:  82 year old former smoker with a history of COPD, previously seen by Dr. Melvyn Novas and on Symbicort/Spiriva as an outpatient.  Also with CAD, GERD with a hiatal hernia, history melanoma, hyperlipidemia, hypertension, cerebrovascular disease. She has been experiencing some right shoulder, subscapular and upper right flank pain for just over a month.  Also with some increased cough and congestion for which she started taking Mucinex DM.  She has not seen any hemoptysis.  Presentation chest x-ray in the ED showed a large right upper lobe opacity concerning for malignancy.  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  PCCM consulted to discuss options for tissue diagnosis.  Pertinent  Medical History   Past Medical History:  Diagnosis Date   Allergy    Anxiety    Arthritis    Cancer ([place])    hx of melanoma    Colon polyps    COPD (chronic obstructive pulmonary disease) (HCC)    Coronary artery disease    Dyspnea    with exertion    Edema    in lower extremties    Emphysema of lung (HCC)    GERD (gastroesophageal reflux disease)    H/O hiatal hernia    Hyperlipidemia    Hypertension    Inverted nipple    bilateral, pt says that's normal   Myocardial infarct, old 2008   no stent placed   Osteoporosis    Pneumonia    hx of    Sepsis ([place])    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  Spiculated left upper lobe nodular opacities.  Lytic lesion in the lateral aspect of the right fourth rib without a pathologic fracture noted  Interim History / Subjective:  No dyspnea.  Minimal cough.  She is still having some right shoulder discomfort  Objective   Blood pressure (!) 147/56, pulse 88, temperature 98.4 F (36.9 C), temperature source Oral, resp. rate 16, height 5' (1.524 m), weight 62.2 kg, SpO2 98 %.        Intake/Output Summary (Last 24 hours) at 10/24/2022 1106 Last data filed at 10/24/2022 0055 Gross per 24 hour  Intake 580 ml  Output --  Net 580 ml   Filed Weights   10/23/22 1600 10/23/22 2343  Weight: 67.1 kg 62.2 kg    Examination: General: Pleasant woman laying in bed on room air, comfortable HENT: Oropharynx clear, no upper airway secretions Lungs: Decreased breath sounds, bronchial breath sounds on the right.  More clear on the left without wheezes or crackles Cardiovascular: Regular, distant without a murmur Abdomen: Nondistended, positive bowel sounds Extremities: No edema Neuro: Awake, alert, interacting appropriately.  She does have decreased hearing, hears better on the left.  Answers questions, moves all extremities. GU: Deferred  Resolved Hospital Problem list  Assessment & Plan:  Right upper lobe mass, left upper lobe nodules.  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 -Discussed with her the options for tissue diagnosis as soon as possible.  She does want to pursue diagnostics and here treatment options.  I recommended bronchoscopy with right upper lobe endobronchial biopsies.  Discussed the risks, benefits, rationale with her and she agrees.  Needle biopsy of her right fourth rib would also be an option but bony  sample would not be amenable to molecular testing. -Attempted to schedule today but unable.  Will work on trying to get this done as soon as possible, probably next week  COPD, without any evidence of acute exacerbation currently -Continue scheduled bronchodilator therapy, currently Incruse and Dulera -Pulmonary hygiene  Question postobstructive pneumonia.  No real cough, purulent sputum, fevers, etc. -Low threshold to change her IV antibiotics over to short course of oral antibiotics, either Augmentin or doxycycline    Labs   CBC: Recent Labs  Lab 10/23/22 1733 10/24/22 0324  WBC 17.8* 15.7*  NEUTROABS  --  11.4*  HGB 10.1* 9.3*  HCT 34.7* 31.8*  MCV 86.8 85.9  PLT 723* 638*    Basic Metabolic Panel: Recent Labs  Lab 10/23/22 1733 10/24/22 0324  NA 137 134*  K 4.4 3.3*  CL 104 102  CO2 25 25  GLUCOSE 114* 98  BUN 17 15  CREATININE 0.64 0.50  CALCIUM 9.6 9.3   GFR: Estimated Creatinine Clearance: 45.4 mL/min (by C-G formula based on SCr of 0.5 mg/dL). Recent Labs  Lab 10/23/22 1733 10/24/22 0324  WBC 17.8* 15.7*    Liver Function Tests: No results for input(s): "AST", "ALT", "ALKPHOS", "BILITOT", "PROT", "ALBUMIN" in the last 168 hours. No results for input(s): "LIPASE", "AMYLASE" in the last 168 hours. No results for input(s): "AMMONIA" in the last 168 hours.  ABG No results found for: "PHART", "PCO2ART", "PO2ART", "HCO3", "TCO2", "ACIDBASEDEF", "O2SAT"   Coagulation Profile: No results for input(s): "INR", "PROTIME" in the last 168 hours.  Cardiac Enzymes: No results for input(s): "CKTOTAL", "CKMB", "CKMBINDEX", "TROPONINI" in the last 168 hours.  HbA1C: Hgb A1c MFr Bld  Date/Time Value Ref Range Status  08/21/2021 03:52 PM 5.7 4.6 - 6.5 % Final    Comment:    Glycemic Control Guidelines for People with Diabetes:Non Diabetic:  <6%Goal of Therapy: <7%Additional Action Suggested:  >8%   01/30/2021 04:13 PM 6.0 4.6 - 6.5 % Final    Comment:     Glycemic Control Guidelines for People with Diabetes:Non Diabetic:  <6%Goal of Therapy: <7%Additional Action Suggested:  >8%     CBG: Recent Labs  Lab 10/24/22 0747  GLUCAP 86    Review of Systems:   As per HPI  Past Medical History:  She,  has a past medical history of Allergy, Anxiety, Arthritis, Cancer ([place]), Colon polyps, COPD (chronic obstructive pulmonary disease) ([place]), Coronary artery disease, Dyspnea, Edema, Emphysema of lung ([place]), GERD (gastroesophageal reflux disease), H/O hiatal hernia, Hyperlipidemia, Hypertension, Inverted nipple, Myocardial infarct, old (2008), Osteoporosis, Pneumonia, Sepsis ([place]), and Stroke ([place]).   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 Her smoking use included cigarettes. She has a 66.00 pack-year smoking history. She has never used smokeless tobacco. She reports current alcohol use of about 2.0 standard drinks of alcohol per week. She reports that she does not use drugs.   Family History:  Her family history includes Arthritis in her mother; Cancer in her maternal grandmother and paternal grandmother; Hearing loss in her father, maternal aunt, and maternal grandmother. There is no history of Breast cancer.   Allergies Allergies  Allergen Reactions   Prednisone Other (See Comments)    Leg pain,light headed   Statins     Myalgias- per patient, she feels better off the medication     Home Medications  Prior to Admission medications    Medication Sig Start Date End Date Taking? Authorizing Provider  acetaminophen (TYLENOL) 500 MG tablet Take 500 mg by mouth every 6 (six) hours as needed for mild pain or moderate pain.   Yes [provider]  albuterol (VENTOLIN HFA) 108 (90 Base) MCG/ACT inhaler INHALE 1-2 PUFFS BY MOUTH EVERY 6 HOURS AS NEEDED FOR WHEEZE OR SHORTNESS OF BREATH 10/13/22  Yes Janith Lima, MD  ALPRAZolam (XANAX) 0.25 MG tablet TAKE 1 TABLET BY MOUTH 2 TIMES DAILY AS NEEDED FOR ANXIETY. Patient taking differently: Take 0.25 mg by mouth at bedtime. 10/06/22  Yes Janith Lima, MD  aspirin 81 MG tablet Take 81 mg by mouth at bedtime.   Yes [provider]  budesonide-formoterol (SYMBICORT) 160-4.5 MCG/ACT inhaler TAKE 2 PUFFS BY MOUTH TWICE A DAY 06/06/22  Yes Janith Lima, MD  Calcium Citrate-Vitamin D (CITRACAL + D PO) Take 1 tablet by mouth in the morning. 600 mg calcium   Yes [provider]  colesevelam (WELCHOL) 625 MG tablet TAKE 1 TABLET (625 MG TOTAL) BY MOUTH 2 (TWO) TIMES DAILY WITH A MEAL. 07/16/22  Yes Janith Lima, MD  cyanocobalamin (VITAMIN B12) 1000 MCG tablet Take 1 tablet (1,000 mcg total) by mouth daily. 08/15/22 08/10/23 Yes Barbee Cough, MD  fenofibrate (TRICOR) 145 MG tablet TAKE 1 TABLET BY MOUTH EVERY DAY 08/01/22  Yes Janith Lima, MD  Ferric Maltol (ACCRUFER) 30 MG CAPS Take 1 capsule by mouth in the morning and at bedtime. 02/26/22  Yes Janith Lima, MD  folic acid (FOLVITE) 1 MG tablet TAKE 1 TABLET BY MOUTH EVERY DAY 08/24/22  Yes Janith Lima, MD  furosemide (LASIX) 20 MG tablet Take 1 tablet (20 mg total) by mouth daily. 06/06/22  Yes Janith Lima, MD  gabapentin (NEURONTIN) 100 MG capsule Take 2 capsules (200 mg total) by mouth 2 (two) times daily. Patient taking differently: Take 100-300 mg by mouth 2 (two) times daily. Take 1 capsule (100 mg) in the morning and Take 3 capsules (300 mg) at bedtime 09/24/22  Yes Hulan Saas M, DO   Garlic Oil 123XX123 MG CAPS Take 1,000 mg by mouth daily.   Yes [provider]  Lactobacillus (PROBIOTIC ACIDOPHILUS) CAPS Take 1 tablet by mouth daily.   Yes [provider]  loratadine (CLARITIN) 10 MG tablet TAKE 1 TABLET BY MOUTH EVERY DAY 05/01/22  Yes Janith Lima, MD  magnesium oxide (MAG-OX) 400 (240 Mg) MG tablet TAKE 1 TABLET BY MOUTH TWICE A DAY 09/05/21  Yes Scarlette Calico  L, MD  metoprolol succinate (TOPROL-XL) 50 MG 24 hr tablet Take 1 tablet (50 mg total) by mouth daily. 09/09/22  Yes Janith Lima, MD  mometasone (NASONEX) 50 MCG/ACT nasal spray PLACE 2 SPRAYS INTO THE NOSE DAILY. 10/31/21  Yes Janith Lima, MD  omeprazole (PRILOSEC) 20 MG capsule TAKE 1 CAPSULE BY MOUTH EVERY DAY 07/07/22  Yes Janith Lima, MD  primidone (MYSOLINE) 50 MG tablet Take 1 tablet (50 mg total) by mouth at bedtime. 08/22/22 08/17/23 Yes Shellia Carwin, MD  raloxifene (EVISTA) 60 MG tablet TAKE 1 TABLET BY MOUTH EVERY DAY 05/21/22  Yes Janith Lima, MD  Tiotropium Bromide Monohydrate (SPIRIVA RESPIMAT) 2.5 MCG/ACT AERS INHALE 2 PUFFS BY MOUTH PER DAY 10/06/22  Yes Janith Lima, MD  triamcinolone cream (KENALOG) 0.1 % Apply 1 application topically 2 (two) times daily. Patient taking differently: Apply 1 application  topically daily as needed (yeast infection under breast). 09/01/17  Yes Dragnev, Caesar Chestnut, NP  Zinc 50 MG TABS TAKE 1 TABLET BY MOUTH EVERY DAY 08/24/22  Yes Janith Lima, MD  zinc gluconate 50 MG tablet Take 1 tablet (50 mg total) by mouth daily. Patient not taking: Reported on 10/23/2022 03/01/22   Janith Lima, MD     Critical care time: NA     Baltazar Apo, MD, PhD 10/24/2022, 11:18 AM [facility] [phone] or if no answer before 7:00PM call [phone] For any issues after 7:00PM please call eLink [phone]

## 2022-10-24 NOTE — Progress Notes (Signed)
Mobility Specialist - Progress Note   10/24/22 1339  Mobility  Activity Ambulated with assistance in hallway  Level of Assistance Contact guard assist, steadying assist  Distance Ambulated (ft) 110 ft  Activity Response Tolerated well  Mobility Referral Yes  $Mobility charge 1 Mobility   Pt received in bed and agreed to mobility. No c/o pain nor discomfort during session. Pt returned to bed with all needs met.   Roderick Pee Mobility Specialist

## 2022-10-24 NOTE — Progress Notes (Signed)
PROGRESS NOTE   Courtney Brown  [id number] DOB: 21-Jun-1941 DOA: 10/23/2022 PCP: Janith Lima, MD  Brief Narrative:   82 year old home dwelling white female Prior hernia repair in 2022 by Dr. Kieth Brightly MI 2008 COPD (quit smoking sometime in 2008) HTN HLD Previous Staph hominis treated with vancomycin via PICC line 2018 at discharge Sciatica follows with sports medicine degenerative arthritis of the knee B12 deficiency with injections under care of Neurologist Doctor Kai Levins, [place]--has essential tremor is on primidone Melanoma status postresection and immunotherapy unknown agent in 1993 Former smoker Anxiety CKD 3 Follows with [facility] Dr. 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  Underlying COPD Resume inhalers albuterol Flonase give substitution for Symbicort with Dulera and Incruse Ellipta  Mild hypokalemia Give 40 mEq of K-Dur today  Iron deficiency/B12  deficiency followed by oncology in [place] Continue B12, ferrous sulfate, folic acid as per prior home [facility]  Essential tremor continue primidone 50 at bedtime, will need outpatient follow-up with her neurologist  Underlying anxiety Continue Xanax 0.25 twice daily as needed  Sciatica Continue Neurontin 200 twice daily-follow-up with outpatient sports physician Dr. Steward Ros and chronic HFpEF echo [id number] diastolic dysfunction Holding Lasix at this time continue metoprolol 50 XL  DVT prophylaxis: Lovenox Code Status: Full Family Communication: None present at the bedside will update once I know more information Disposition:  Status is: Inpatient Remains inpatient appropriate because:   Requires workup     Subjective: Looks well feels okay no chest pain no fever no chills Sitting up in the bed without difficulty-tells me she still drives does IADLs ADLs Tells me that she had a cough and was taking Mucinex for several weeks but was scared to use too much of it-she has had no real fevers or chills She does not have any rib pain at this time and is able to mobilize fair  Objective: Vitals:   10/23/22 2030 10/23/22 2222 10/23/22 2343 10/24/22 0343  BP:  (!) 163/65  (!) 134/53  Pulse:  97  73  Resp:  18  16  Temp: 98 F (36.7 C) 97.8 F (36.6 C)  98.3 F (36.8 C)  TempSrc:    Oral  SpO2:  99%  94%  Weight:   62.2 kg   Height:   5' (1.524 m)     Intake/Output Summary (Last 24 hours) at 10/24/2022 0729 Last data filed at 10/24/2022 0055 Gross per 24 hour  Intake 580 ml  Output --  Net 580 ml   Filed Weights   10/23/22 1600 10/23/22 2343  Weight: 67.1 kg 62.2 kg    Examination:  EOMI NCAT looks younger than stated age no icterus no pallor euthymic congruent no lymphadenopathy submandibular Did not examine axilla Abdomen is soft no rebound no guarding S1-S2 no murmur No lower extremity edema Neuro power 5/5 Congruent  Data Reviewed: personally reviewed    CBC    Component Value Date/Time   WBC 15.7 (H) 10/24/2022 0324   RBC 3.70 (L) 10/24/2022 0324   HGB 9.3 (L) 10/24/2022 0324   HGB 11.2 (L) 08/15/2022 1415   HCT 31.8 (L) 10/24/2022 0324   PLT 638 (H) 10/24/2022 0324   PLT 604 (H) 08/15/2022 1415   MCV 85.9 10/24/2022 0324   MCH 25.1 (L) 10/24/2022 0324   MCHC 29.2 (L) 10/24/2022 0324   RDW 15.6 (H) 10/24/2022 0324   LYMPHSABS 1.9 10/24/2022 0324   MONOABS 1.6 (H) 10/24/2022 0324   EOSABS 0.6 (H) 10/24/2022 0324   BASOSABS 0.1 10/24/2022 0324      Latest Ref Rng & Units 10/24/2022    3:24 AM 10/23/2022    5:33 PM 08/15/2022    2:15 PM  CMP  Glucose 70 - 99 mg/dL 98  114  107   BUN 8 - 23 mg/dL 15  17  17    Creatinine 0.44 - 1.00 mg/dL 0.50  0.64  0.89   Sodium 135 - 145 mmol/L 134  137  136   Potassium 3.5 - 5.1 mmol/L 3.3  4.4  4.1   Chloride 98 - 111 mmol/L 102  104  101   CO2 22 - 32 mmol/L 25  25  31    Calcium 8.9 - 10.3 mg/dL 9.3  9.6  10.5   Total Protein 6.5 - 8.1 g/dL   6.3   Total Bilirubin 0.3 - 1.2 mg/dL   0.4   Alkaline Phos 38 - 126 U/L   52   AST 15 - 41 U/L   13   ALT 0 - 44 U/L   7      Radiology Studies: CT Angio Chest PE W and/or Wo Contrast  Result Date: 10/23/2022 CLINICAL DATA:  Large right upper lobe mass EXAM: CT ANGIOGRAPHY CHEST WITH CONTRAST TECHNIQUE: Multidetector CT imaging of the chest was performed using the standard protocol during bolus administration of intravenous contrast. Multiplanar CT image reconstructions and MIPs were obtained to evaluate the vascular anatomy. RADIATION DOSE REDUCTION: This exam was performed according to the departmental dose-optimization program which includes automated exposure control, adjustment of the mA and/or kV according to patient size and/or use of iterative reconstruction technique. CONTRAST:  120mL OMNIPAQUE IOHEXOL 350 MG/ML SOLN COMPARISON:  Chest x-ray from earlier in the same day. FINDINGS: Cardiovascular: Atherosclerotic calcifications of the  aorta are noted without aneurysmal dilatation. The pulmonary artery shows a normal branching pattern bilaterally. No defect to suggest pulmonary embolism is noted. Cardiac shadow is at the upper limits of normal size. Mediastinum/Nodes: Thoracic inlet is within normal limits. Small right paratracheal and subcarinal lymph nodes are noted but not significant by size criteria. No left hilar adenopathy is noted. The right hilum is somewhat obscured due to central soft tissue density. The esophagus as visualized is within normal limits. Lungs/Pleura: Diffuse emphysematous changes are noted. Some spiculated appearing areas are noted in the anterior aspect of the left upper lobe best seen on image number 30 and 31 of series 11. The largest of these measures up to 11 mm. Small right-sided pleural effusion is noted. Mild right basilar atelectasis is  seen. There is a large geographic area of soft tissue density identified in the right upper lobe with mass effect upon the major fissure. The majority of the right upper lobe bronchial branches are occluded centrally likely related to central hilar mass. There is slight increased density noted adjacent to the bronchial occlusion which measures approximately 3 cm in greatest dimension and consistent with underlying mass. It is difficult to assess how much of the more peripheral consolidation is related to mass or collapsed lung. Upper Abdomen: Scattered simple cysts are noted within the kidneys. No follow-up is recommended. No other focal abnormality is noted the upper abdomen. Musculoskeletal: Lytic lesion is noted in the lateral aspect of the right fourth rib consistent with metastatic disease. No pathologic fracture is seen. No other rib abnormality is noted. No other definitive lytic or sclerotic lesions are seen. Review of the MIP images confirms the above findings. 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 Referral to multi disciplinary pulmonary team is recommended. Tissue sampling and likely PET-CT are also recommended for further evaluation. 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 Spiculated densities in the left upper lobe which may represent metastatic disease. No evidence of pulmonary emboli. Aortic Atherosclerosis (ICD10-I70.0) and Emphysema (ICD10-J43.9). Electronically Signed   By: Inez Catalina M.D.   On: 10/23/2022 19:51   DG Chest 2 View  Result Date: 10/23/2022 CLINICAL DATA:  Shortness of breath. EXAM: CHEST - 2 VIEW COMPARISON:  January 18, 2018. FINDINGS: The heart size and mediastinal contours are within normal limits. Left lung is clear. Large rounded mass is noted in right upper lobe highly concerning for malignancy, with probable associated postobstructive atelectasis. Minimal right pleural effusion is noted. The visualized skeletal structures are unremarkable. IMPRESSION: Large rounded mass is noted in right upper lobe highly concerning for malignancy. CT scan of the chest is recommended for further evaluation. Electronically Signed   By: Marijo Conception M.D.   On: 10/23/2022 16:39     Scheduled Meds:  cyanocobalamin  1,000 mcg Oral Daily   enoxaparin (LOVENOX) injection  40 mg Subcutaneous Q24H   ferrous sulfate  325 mg Oral BID WC   fluticasone  2 spray Each Nare Daily   folic acid  1 mg Oral Daily   gabapentin  200 mg Oral BID   guaiFENesin  600 mg Oral BID   metoprolol succinate  50 mg Oral BID   mometasone-formoterol  2 puff Inhalation BID   pantoprazole  40 mg Oral Daily   potassium chloride  40 mEq Oral Daily   primidone  50 mg Oral QHS   umeclidinium bromide  1 puff Inhalation Daily   zinc sulfate  220 mg Oral Daily   Continuous Infusions:  azithromycin 500 mg (10/23/22 2028)   cefTRIAXone (ROCEPHIN)  IV       LOS: 1 day   Time spent: [place], MD [facility] To contact the attending provider  between 7A-7P or the covering provider during after hours 7P-7A, please log into the web site [email or web address] and access using universal  password for that web site. If you do not have the password, please call the hospital operator.  10/24/2022, 7:29 AM

## 2022-10-24 NOTE — Plan of Care (Signed)
  Problem: Health Behavior/Discharge Planning: Goal: Ability to manage health-related needs will improve Outcome: Progressing   Problem: Clinical Measurements: Goal: Respiratory complications will improve Outcome: Progressing   Problem: Activity: Goal: Risk for activity intolerance will decrease Outcome: Progressing   Problem: Nutrition: Goal: Adequate nutrition will be maintained Outcome: Progressing   Problem: Pain Managment: Goal: General experience of comfort will improve Outcome: Progressing   Problem: Safety: Goal: Ability to remain free from injury will improve Outcome: Progressing   Problem: Skin Integrity: Goal: Risk for impaired skin integrity will decrease Outcome: Progressing

## 2022-10-24 NOTE — Plan of Care (Signed)

## 2022-10-25 ENCOUNTER — Telehealth: Payer: Self-pay | Admitting: Emergency Medicine

## 2022-10-25 DIAGNOSIS — R918 Other nonspecific abnormal finding of lung field: Secondary | ICD-10-CM | POA: Diagnosis not present

## 2022-10-25 LAB — CBC WITH DIFFERENTIAL/PLATELET
Abs Immature Granulocytes: 0.12 10*3/uL — ABNORMAL HIGH (ref 0.00–0.07)
Basophils Absolute: 0 10*3/uL (ref 0.0–0.1)
Basophils Relative: 0 %
Eosinophils Absolute: 0.6 10*3/uL — ABNORMAL HIGH (ref 0.0–0.5)
Eosinophils Relative: 4 %
HCT: 29.7 % — ABNORMAL LOW (ref 36.0–46.0)
Hemoglobin: 9.1 g/dL — ABNORMAL LOW (ref 12.0–15.0)
Immature Granulocytes: 1 %
Lymphocytes Relative: 12 %
Lymphs Abs: 1.8 10*3/uL (ref 0.7–4.0)
MCH: 25.8 pg — ABNORMAL LOW (ref 26.0–34.0)
MCHC: 30.6 g/dL (ref 30.0–36.0)
MCV: 84.1 fL (ref 80.0–100.0)
Monocytes Absolute: 1.6 10*3/uL — ABNORMAL HIGH (ref 0.1–1.0)
Monocytes Relative: 10 %
Neutro Abs: 11.2 10*3/uL — ABNORMAL HIGH (ref 1.7–7.7)
Neutrophils Relative %: 73 %
Platelets: 632 10*3/uL — ABNORMAL HIGH (ref 150–400)
RBC: 3.53 MIL/uL — ABNORMAL LOW (ref 3.87–5.11)
RDW: 15.6 % — ABNORMAL HIGH (ref 11.5–15.5)
WBC: 15.4 10*3/uL — ABNORMAL HIGH (ref 4.0–10.5)
nRBC: 0 % (ref 0.0–0.2)

## 2022-10-25 LAB — RENAL FUNCTION PANEL
Albumin: 2.2 g/dL — ABNORMAL LOW (ref 3.5–5.0)
Anion gap: 8 (ref 5–15)
BUN: 11 mg/dL (ref 8–23)
CO2: 24 mmol/L (ref 22–32)
Calcium: 9.4 mg/dL (ref 8.9–10.3)
Chloride: 103 mmol/L (ref 98–111)
Creatinine, Ser: 0.6 mg/dL (ref 0.44–1.00)
GFR, Estimated: >60 mL/min (ref >60)
Glucose, Bld: 95 mg/dL (ref 70–99)
Phosphorus: 2.9 mg/dL (ref 2.5–4.6)
Potassium: 3.9 mmol/L (ref 3.5–5.1)
Sodium: 135 mmol/L (ref 135–145)

## 2022-10-25 MED ORDER — HYDROCODONE-ACETAMINOPHEN 5-325 MG PO TABS: 1.0000 | ORAL_TABLET | ORAL | 0 refills | Status: DC | PRN

## 2022-10-25 MED ORDER — POTASSIUM CHLORIDE CRYS ER 20 MEQ PO TBCR: 40.0000 meq | EXTENDED_RELEASE_TABLET | Freq: Every day | ORAL | 0 refills | Status: DC

## 2022-10-25 MED ORDER — AMOXICILLIN-POT CLAVULANATE 875-125 MG PO TABS: 1.0000 | ORAL_TABLET | Freq: Two times a day (BID) | ORAL | 0 refills | Status: AC

## 2022-10-25 MED ORDER — ONDANSETRON HCL 4 MG PO TABS: 4.0000 mg | ORAL_TABLET | Freq: Three times a day (TID) | ORAL | 0 refills | Status: DC | PRN

## 2022-10-25 NOTE — Plan of Care (Signed)
°  Problem: Clinical Measurements: °Goal: Ability to maintain clinical measurements within normal limits will improve °Outcome: Progressing °  °Problem: Nutrition: °Goal: Adequate nutrition will be maintained °Outcome: Progressing °  °Problem: Coping: °Goal: Level of anxiety will decrease °Outcome: Progressing °  °Problem: Elimination: °Goal: Will not experience complications related to bowel motility °Outcome: Progressing °  °Problem: Pain Managment: °Goal: General experience of comfort will improve °Outcome: Progressing °  °Problem: Safety: °Goal: Ability to remain free from injury will improve °Outcome: Progressing °  °

## 2022-10-25 NOTE — Discharge Summary (Signed)
Physician Discharge Summary  Courtney Brown [id number] DOB: 1941/03/18 DOA: 10/23/2022  PCP: Janith Lima, MD  Admit date: 10/23/2022 Discharge date: 10/25/2022  Time spent: 43 minutes  Recommendations for Outpatient Follow-up:  New medications: Percocet, Zofran, Augmentin to complete circumscribes course for possible pneumonia Bronchoscopy has already been arranged by Dr. Lamonte Sakai of pulmonary medicine at [facility] on 10/27/2022-greatly appreciate his input CC Dr. 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 Kieth Brightly MI 2008 COPD (quit smoking sometime in 2008) HTN HLD Previous Staph hominis treated with vancomycin via PICC line 2018 at discharge Sciatica follows with sports medicine degenerative arthritis of the knee B12 deficiency with injections under care of Neurologist Doctor Kai Levins, [place]--has essential tremor is on primidone Melanoma status postresection and  immunotherapy unknown agent in 1993 Former smoker Anxiety CKD 3 Follows with [facility] Dr. Federico Flake for iron deficiency anemia   Presented to [facility] ED 3/28 with dyspnea inability to take deep inhalation right-sided chest pain with radiation for the past 2 weeks Also attributed weight loss to decreased appetite   Workup revealed pulse rate 113 O2 sat 100% temp 97.6 WBC 17 platelets 723 hemoglobin 10 MCV 86 with predominant left shift K4.4 CT chest imaging = central occluding neoplasm right upper lobe with bony mets on the fourth right rib and spiculated density LUL no pulmonary emboli   Rx ceftriaxone azithromycin--pulmonology saw the patient in consult as below  Hospital Course:  Possible postobstructive pneumonia Antibiotics azithromycin/ceftriaxone tailored at the suggestion of pulmonology as well as metabolic parameters and imaging in the hearing this is more likely a postobstructive process and requires pulmonary toilet We sent the patient home on Augmentin to complete a total of 7 days treatment and she understands she needs to complete the therapy   Likely multifocal cancer in the lung and a former smoker Case discussed and appreciate input from Dr. Lamonte Sakai who saw the patient in consult-attempt at Bronchospcy was mad unfortunately couldn't be accomplished in house but is scheduled for 10/27/2022 Patient is aware to hold ASA until procedure is over   Underlying COPD Patient continued to have some cough but was given her usual inhalers and did not seem to require any mucolytic's or anything on discharge   Mild hypokalemia Mild hypokalemia during hospitalization which was replaced orally and patient was sent home on several days of replacement and will need outpatient follow-up for labs   Iron deficiency/B12 deficiency followed by oncology in [place] Continue B12, ferrous sulfate, folic acid as per prior  home MAR   Essential tremor continue primidone 50 at  bedtime, will need outpatient follow-up with her neurologist   Underlying anxiety Continue Xanax 0.25 twice daily as needed-given her advanced age this is a beers criteria medication and hopefully we can de-escalate off of this   Sciatica Continue Neurontin 200 twice daily-follow-up with outpatient sports physician Dr. Steward Ros and chronic HFpEF echo [id number] diastolic dysfunction Holding Lasix at this time continue metoprolol 50 XL   Discharge Exam: Vitals:   10/24/22 2102 10/25/22 0454  BP: (!) 132/51 (!) 140/61  Pulse: 84 91  Resp: 16 16  Temp: 97.9 F (36.6 C) 98.7 F (37.1 C)  SpO2: 96% 94%    Subj on day of d/c   Pleasant awake alert eating breakfast Walked several 100 feet yesterday Feels much better than on admission no chest pain no fever intermittent cough  General Exam on discharge  EOMI NCAT thick neck looks about stated age No icterus no pallor Mallampati 2 Decreased air entry on the right side?  Rales Left side seems appropriate S1-S2 no murmur sinus rhythm No lower extremity edema Abdomen soft  Discharge Instructions   Discharge Instructions     Diet - low sodium heart healthy   Complete by: As directed    Discharge instructions   Complete by: As directed    This hospitalization you were diagnosed with what appears to be a lung cancer and a possible pneumonia To help [place] get this diagnosis solidified you are scheduled for bronchoscopy at [facility] on 10/27/2022.  Please do not take your aspirin until that time -you can take all of your other medications, inhalers as well as blood pressure medications and meds for your tremor that you have been taking previously  I would recommend that you complete course of Augmentin as prescribed-there is a small likelihood that you have pneumonia and we recommend that you complete the antibiotics as we have prescribed for you We will call in some nausea meds and some pain meds for your additionally I  hope that you get the answers you need on the bronchoscopy and we will send a message to the oncologist to ensure that they are aware of close follow-up needs and scheduling  Take care and have a peaceful Easter   Increase activity slowly   Complete by: As directed       Allergies as of 10/25/2022       Reactions   Prednisone Other (See Comments)   Leg pain,light headed   Statins    Myalgias- per patient, she feels better off the medication        Medication List     STOP taking these medications    aspirin 81 MG tablet   cyanocobalamin 1000 MCG tablet Commonly known as: VITAMIN B12   furosemide 20 MG tablet Commonly known as: LASIX   Spiriva Respimat 2.5 MCG/ACT Aers Generic drug: Tiotropium Bromide Monohydrate       TAKE these medications    ACCRUFeR 30 MG Caps Generic drug: Ferric Maltol Take 1 capsule by mouth in the morning and at bedtime.   acetaminophen 500 MG tablet Commonly known as: TYLENOL Take 500 mg by mouth every 6 (six) hours as needed for mild pain or moderate pain.   albuterol 108 (90 Base) MCG/ACT inhaler Commonly known as: VENTOLIN HFA INHALE 1-2 PUFFS BY MOUTH EVERY 6 HOURS AS NEEDED FOR WHEEZE OR SHORTNESS OF BREATH   ALPRAZolam 0.25 MG tablet Commonly known  as: XANAX TAKE 1 TABLET BY MOUTH 2 TIMES DAILY AS NEEDED FOR ANXIETY. What changed: when to take this   amoxicillin-clavulanate 875-125 MG tablet Commonly known as: AUGMENTIN Take 1 tablet by mouth every 12 (twelve) hours for 5 days.   budesonide-formoterol 160-4.5 MCG/ACT inhaler Commonly known as: Symbicort TAKE 2 PUFFS BY MOUTH TWICE A DAY   CITRACAL + D PO Take 1 tablet by mouth in the morning. 600 mg calcium   colesevelam 625 MG tablet Commonly known as: WELCHOL TAKE 1 TABLET (625 MG TOTAL) BY MOUTH 2 (TWO) TIMES DAILY WITH A MEAL.   fenofibrate 145 MG tablet Commonly known as: TRICOR TAKE 1 TABLET BY MOUTH EVERY DAY   folic acid 1 MG tablet Commonly known as:  FOLVITE TAKE 1 TABLET BY MOUTH EVERY DAY   gabapentin 100 MG capsule Commonly known as: NEURONTIN Take 2 capsules (200 mg total) by mouth 2 (two) times daily. What changed:  how much to take additional instructions   Garlic Oil 123XX123 MG Caps Take 1,000 mg by mouth daily.   HYDROcodone-acetaminophen 5-325 MG tablet Commonly known as: NORCO/VICODIN Take 1 tablet by mouth every 4 (four) hours as needed for moderate pain.   loratadine 10 MG tablet Commonly known as: CLARITIN TAKE 1 TABLET BY MOUTH EVERY DAY   magnesium oxide 400 (240 Mg) MG tablet Commonly known as: MAG-OX TAKE 1 TABLET BY MOUTH TWICE A DAY   metoprolol succinate 50 MG 24 hr tablet Commonly known as: TOPROL-XL Take 1 tablet (50 mg total) by mouth daily.   mometasone 50 MCG/ACT nasal spray Commonly known as: NASONEX PLACE 2 SPRAYS INTO THE NOSE DAILY.   omeprazole 20 MG capsule Commonly known as: PRILOSEC TAKE 1 CAPSULE BY MOUTH EVERY DAY   ondansetron 4 MG tablet Commonly known as: Zofran Take 1 tablet (4 mg total) by mouth every 8 (eight) hours as needed for nausea or vomiting.   potassium chloride SA 20 MEQ tablet Commonly known as: KLOR-CON M Take 2 tablets (40 mEq total) by mouth daily.   primidone 50 MG tablet Commonly known as: MYSOLINE Take 1 tablet (50 mg total) by mouth at bedtime.   Probiotic Acidophilus Caps Take 1 tablet by mouth daily.   raloxifene 60 MG tablet Commonly known as: EVISTA TAKE 1 TABLET BY MOUTH EVERY DAY   triamcinolone cream 0.1 % Commonly known as: KENALOG Apply 1 application topically 2 (two) times daily. What changed:  when to take this reasons to take this   Zinc 50 MG Tabs TAKE 1 TABLET BY MOUTH EVERY DAY   zinc gluconate 50 MG tablet Take 1 tablet (50 mg total) by mouth daily.       Allergies  Allergen Reactions   Prednisone Other (See Comments)    Leg pain,light headed   Statins     Myalgias- per patient, she feels better off the medication       The results of significant diagnostics from this hospitalization (including imaging, microbiology, ancillary and laboratory) are listed below for reference.    Significant Diagnostic Studies: CT Angio Chest PE W and/or Wo Contrast  Result Date: 10/23/2022 CLINICAL DATA:  Large right upper lobe mass EXAM: CT ANGIOGRAPHY CHEST WITH CONTRAST TECHNIQUE: Multidetector CT imaging of the chest was performed using the standard protocol during bolus administration of intravenous contrast. Multiplanar CT image reconstructions and MIPs were obtained to evaluate the vascular anatomy. RADIATION DOSE REDUCTION: This exam was performed according to the departmental dose-optimization program which includes automated  exposure control, adjustment of the mA and/or kV according to patient size and/or use of iterative reconstruction technique. CONTRAST:  167mL OMNIPAQUE IOHEXOL 350 MG/ML SOLN COMPARISON:  Chest x-ray from earlier in the same day. FINDINGS: Cardiovascular: Atherosclerotic calcifications of the aorta are noted without aneurysmal dilatation. The pulmonary artery shows a normal branching pattern bilaterally. No defect to suggest pulmonary embolism is noted. Cardiac shadow is at the upper limits of normal size. Mediastinum/Nodes: Thoracic inlet is within normal limits. Small right paratracheal and subcarinal lymph nodes are noted but not significant by size criteria. No left hilar adenopathy is noted. The right hilum is somewhat obscured due to central soft tissue density. The esophagus as visualized is within normal limits. Lungs/Pleura: Diffuse emphysematous changes are noted. Some spiculated appearing areas are noted in the anterior aspect of the left upper lobe best seen on image number 30 and 31 of series 11. The largest of these measures up to 11 mm. Small right-sided pleural effusion is noted. Mild right basilar atelectasis is seen. There is a large geographic area of soft tissue density identified in  the right upper lobe with mass effect upon the major fissure. The majority of the right upper lobe bronchial branches are occluded centrally likely related to central hilar mass. There is slight increased density noted adjacent to the bronchial occlusion which measures approximately 3 cm in greatest dimension and consistent with underlying mass. It is difficult to assess how much of the more peripheral consolidation is related to mass or collapsed lung. Upper Abdomen: Scattered simple cysts are noted within the kidneys. No follow-up is recommended. No other focal abnormality is noted the upper abdomen. Musculoskeletal: Lytic lesion is noted in the lateral aspect of the right fourth rib consistent with metastatic disease. No pathologic fracture is seen. No other rib abnormality is noted. No other definitive lytic or sclerotic lesions are seen. Review of the MIP images confirms the above findings. 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 Referral to multi disciplinary pulmonary team is recommended. Tissue sampling and likely PET-CT are also recommended for further evaluation. 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 Spiculated densities in the left upper lobe which may represent metastatic disease. No evidence of pulmonary emboli. Aortic Atherosclerosis (ICD10-I70.0) and Emphysema (ICD10-J43.9). Electronically Signed   By: Inez Catalina M.D.   On: 10/23/2022 19:51   DG Chest 2 View  Result Date: 10/23/2022 CLINICAL DATA:  Shortness of breath. EXAM: CHEST - 2 VIEW COMPARISON:  January 18, 2018. FINDINGS: The heart size and mediastinal contours are within normal limits. Left lung is clear. Large rounded mass is noted in right upper lobe highly concerning for malignancy, with probable associated postobstructive atelectasis. Minimal right pleural effusion is noted. The visualized skeletal structures are  unremarkable. IMPRESSION: Large rounded mass is noted in right upper lobe highly concerning for malignancy. CT scan of the chest is recommended for further evaluation. Electronically Signed   By: Marijo Conception M.D.   On: 10/23/2022 16:39    Microbiology: Recent Results (from the past 240 hour(s))  Resp panel by RT-PCR (RSV, Flu A&B, Covid) Anterior Nasal Swab     Status: None   Collection Time: 10/23/22  5:33 PM   Specimen: Anterior Nasal Swab  Result Value Ref Range Status   SARS Coronavirus 2 by RT PCR NEGATIVE NEGATIVE Final    Comment: (NOTE) SARS-CoV-2 target nucleic acids are NOT DETECTED.  The SARS-CoV-2  RNA is generally detectable in upper respiratory specimens during the acute phase of infection. The lowest concentration of SARS-CoV-2 viral copies this assay can detect is 138 copies/mL. A negative result does not preclude SARS-Cov-2 infection and should not be used as the sole basis for treatment or other patient management decisions. A negative result may occur with  improper specimen collection/handling, submission of specimen other than nasopharyngeal swab, presence of viral mutation(s) within the areas targeted by this assay, and inadequate number of viral copies(<138 copies/mL). A negative result must be combined with clinical observations, patient history, and epidemiological information. The expected result is Negative.  Fact Sheet for Patients:  EntrepreneurPulse.com.au  Fact Sheet for Healthcare Providers:  IncredibleEmployment.be  This test is no t yet approved or cleared by the [place] FDA and  has been authorized for detection and/or diagnosis of SARS-CoV-2 by FDA under an Emergency Use Authorization (EUA). This EUA will remain  in effect (meaning this test can be used) for the duration of the COVID-19 declaration under Section 564(b)(1) of the Act, 21 U.S.C.section 360bbb-3(b)(1), unless the authorization is  terminated  or revoked sooner.       Influenza A by PCR NEGATIVE NEGATIVE Final   Influenza B by PCR NEGATIVE NEGATIVE Final    Comment: (NOTE) The Xpert Xpress SARS-CoV-2/FLU/RSV plus assay is intended as an aid in the diagnosis of influenza from Nasopharyngeal swab specimens and should not be used as a sole basis for treatment. Nasal washings and aspirates are unacceptable for Xpert Xpress SARS-CoV-2/FLU/RSV testing.  Fact Sheet for Patients: EntrepreneurPulse.com.au  Fact Sheet for Healthcare Providers: IncredibleEmployment.be  This test is not yet approved or cleared by the [place] FDA and has been authorized for detection and/or diagnosis of SARS-CoV-2 by FDA under an Emergency Use Authorization (EUA). This EUA will remain in effect (meaning this test can be used) for the duration of the COVID-19 declaration under Section 564(b)(1) of the Act, 21 U.S.C. section 360bbb-3(b)(1), unless the authorization is terminated or revoked.     Resp Syncytial Virus by PCR NEGATIVE NEGATIVE Final    Comment: (NOTE) Fact Sheet for Patients: EntrepreneurPulse.com.au  Fact Sheet for Healthcare Providers: IncredibleEmployment.be  This test is not yet approved or cleared by the [place] FDA and has been authorized for detection and/or diagnosis of SARS-CoV-2 by FDA under an Emergency Use Authorization (EUA). This EUA will remain in effect (meaning this test can be used) for the duration of the COVID-19 declaration under Section 564(b)(1) of the Act, 21 U.S.C. section 360bbb-3(b)(1), unless the authorization is terminated or revoked.  Performed at [facility], [place] [address] [place], [place] [address]      Labs: Basic Metabolic Panel: Recent Labs  Lab 10/23/22 1733 10/24/22 0324 10/25/22 0406  NA 137 134* 135  K 4.4 3.3* 3.9  CL 104 102 103  CO2 25 25 24   GLUCOSE 114* 98  95  BUN 17 15 11   CREATININE 0.64 0.50 0.60  CALCIUM 9.6 9.3 9.4  PHOS  --   --  2.9   Liver Function Tests: Recent Labs  Lab 10/25/22 0406  ALBUMIN 2.2*   No results for input(s): "LIPASE", "AMYLASE" in the last 168 hours. No results for input(s): "AMMONIA" in the last 168 hours. CBC: Recent Labs  Lab 10/23/22 1733 10/24/22 0324 10/25/22 0406  WBC 17.8* 15.7* 15.4*  NEUTROABS  --  11.4* 11.2*  HGB 10.1* 9.3* 9.1*  HCT 34.7* 31.8* 29.7*  MCV 86.8 85.9 84.1  PLT 723* 638* 632*   Cardiac Enzymes: No results for input(s): "CKTOTAL", "CKMB", "CKMBINDEX", "TROPONINI" in the last 168 hours. BNP: BNP (last 3 results) No results for input(s): "BNP" in the last 8760 hours.  ProBNP (last 3 results) No results for input(s): "PROBNP" in the last 8760 hours.  CBG: Recent Labs  Lab 10/24/22 0747  GLUCAP 86       Signed:  Nita Sells MD   [facility] 10/25/2022, 8:49 AM

## 2022-10-25 NOTE — Progress Notes (Signed)
  Transition of Care [facility]) Screening Note   Patient Details  Name: Courtney Brown Date of Birth: 1941/03/06   Transition of Care [facility]) CM/SW Contact:    Henrietta Dine, RN Phone Number: 10/25/2022, 10:09 AM    Transition of Care Department [facility]) has reviewed patient and no TOC needs have been identified at this time. We will continue to monitor patient advancement through interdisciplinary progression rounds. If new patient transition needs arise, please place a TOC consult.

## 2022-10-25 NOTE — Plan of Care (Signed)
  Problem: Clinical Measurements: Goal: Ability to maintain clinical measurements within normal limits will improve 10/25/2022 0722 by Shirley Friar, RN Outcome: Progressing 10/25/2022 0721 by Shirley Friar, RN Outcome: Progressing   Problem: Activity: Goal: Risk for activity intolerance will decrease Outcome: Progressing   Problem: Nutrition: Goal: Adequate nutrition will be maintained 10/25/2022 0722 by Shirley Friar, RN Outcome: Progressing 10/25/2022 0721 by Shirley Friar, RN Outcome: Progressing   Problem: Coping: Goal: Level of anxiety will decrease Outcome: Progressing   Problem: Elimination: Goal: Will not experience complications related to bowel motility 10/25/2022 0722 by Shirley Friar, RN Outcome: Progressing 10/25/2022 0721 by Shirley Friar, RN Outcome: Progressing   Problem: Pain Managment: Goal: General experience of comfort will improve 10/25/2022 0722 by Shirley Friar, RN Outcome: Progressing 10/25/2022 0721 by Shirley Friar, RN Outcome: Progressing   Problem: Safety: Goal: Ability to remain free from injury will improve 10/25/2022 0722 by Shirley Friar, RN Outcome: Progressing 10/25/2022 0721 by Shirley Friar, RN Outcome: Progressing   Problem: Skin Integrity: Goal: Risk for impaired skin integrity will decrease Outcome: Progressing

## 2022-10-25 NOTE — Telephone Encounter (Signed)
Spoke to the patient to review some instructions for her upcoming bronchoscopy.  I informed her that she will probably get another call from preop/anesthesia with more instructions.  Her she is scheduled for 2:00 ask her to get her between 52 and 12, gave her instructions on where to go.  Her aspirin is on hold.  She will continue her Augmentin, metoprolol, inhalers and hold everything else.

## 2022-10-25 NOTE — Progress Notes (Signed)
Pt is discharging home with no needs. Pt is alert and oriented. Pt IV was discontinued. Pt belongings were packed and sent home with the pt. Pt AVS was given and explained. RN explained to pt how to take medications and when next doses were due. RN explained to pt about bronchoscopy appointment on Monday 10/27/22. Pt was given time to ask questions. Pt being transported home by son-in-law, Lennette Bihari. Pt escorted to main entrance via wheelchair (with belongings and personal walker) by NT.

## 2022-10-27 ENCOUNTER — Encounter (HOSPITAL_COMMUNITY)
Admission: RE | Disposition: A | Discharge status: Home / Self Care | Payer: Self-pay | Source: Ambulatory Visit | Attending: Emergency Medicine

## 2022-10-27 ENCOUNTER — Ambulatory Visit (HOSPITAL_COMMUNITY): Payer: 59 | Admitting: Anesthesiology

## 2022-10-27 ENCOUNTER — Other Ambulatory Visit: Payer: Self-pay

## 2022-10-27 ENCOUNTER — Encounter (HOSPITAL_COMMUNITY): Payer: Self-pay | Admitting: Emergency Medicine

## 2022-10-27 ENCOUNTER — Other Ambulatory Visit: Payer: Self-pay | Admitting: Emergency Medicine

## 2022-10-27 ENCOUNTER — Ambulatory Visit (HOSPITAL_COMMUNITY)
Admission: RE | Admit: 2022-10-27 | Discharge: 2022-10-27 | Disposition: A | Discharge status: Home / Self Care | Payer: 59 | Source: Ambulatory Visit | Attending: Emergency Medicine | Admitting: Emergency Medicine

## 2022-10-27 ENCOUNTER — Ambulatory Visit (HOSPITAL_BASED_OUTPATIENT_CLINIC_OR_DEPARTMENT_OTHER): Payer: 59 | Admitting: Anesthesiology

## 2022-10-27 DIAGNOSIS — K449 Diaphragmatic hernia without obstruction or gangrene: Secondary | ICD-10-CM | POA: Insufficient documentation

## 2022-10-27 DIAGNOSIS — Z8673 Personal history of transient ischemic attack (TIA), and cerebral infarction without residual deficits: Secondary | ICD-10-CM | POA: Insufficient documentation

## 2022-10-27 DIAGNOSIS — J439 Emphysema, unspecified: Secondary | ICD-10-CM | POA: Insufficient documentation

## 2022-10-27 DIAGNOSIS — I10 Essential (primary) hypertension: Secondary | ICD-10-CM

## 2022-10-27 DIAGNOSIS — F419 Anxiety disorder, unspecified: Secondary | ICD-10-CM | POA: Insufficient documentation

## 2022-10-27 DIAGNOSIS — I739 Peripheral vascular disease, unspecified: Secondary | ICD-10-CM | POA: Insufficient documentation

## 2022-10-27 DIAGNOSIS — R918 Other nonspecific abnormal finding of lung field: Secondary | ICD-10-CM

## 2022-10-27 DIAGNOSIS — Z85820 Personal history of malignant melanoma of skin: Secondary | ICD-10-CM | POA: Diagnosis not present

## 2022-10-27 DIAGNOSIS — F411 Generalized anxiety disorder: Secondary | ICD-10-CM

## 2022-10-27 DIAGNOSIS — Z79899 Other long term (current) drug therapy: Secondary | ICD-10-CM | POA: Insufficient documentation

## 2022-10-27 DIAGNOSIS — K219 Gastro-esophageal reflux disease without esophagitis: Secondary | ICD-10-CM | POA: Diagnosis not present

## 2022-10-27 DIAGNOSIS — I2510 Atherosclerotic heart disease of native coronary artery without angina pectoris: Secondary | ICD-10-CM | POA: Diagnosis not present

## 2022-10-27 DIAGNOSIS — J449 Chronic obstructive pulmonary disease, unspecified: Secondary | ICD-10-CM | POA: Diagnosis not present

## 2022-10-27 DIAGNOSIS — Z87891 Personal history of nicotine dependence: Secondary | ICD-10-CM | POA: Insufficient documentation

## 2022-10-27 DIAGNOSIS — D649 Anemia, unspecified: Secondary | ICD-10-CM | POA: Insufficient documentation

## 2022-10-27 DIAGNOSIS — C3411 Malignant neoplasm of upper lobe, right bronchus or lung: Secondary | ICD-10-CM | POA: Insufficient documentation

## 2022-10-27 DIAGNOSIS — I252 Old myocardial infarction: Secondary | ICD-10-CM | POA: Diagnosis not present

## 2022-10-27 DIAGNOSIS — R21 Rash and other nonspecific skin eruption: Secondary | ICD-10-CM

## 2022-10-27 DIAGNOSIS — M1990 Unspecified osteoarthritis, unspecified site: Secondary | ICD-10-CM | POA: Diagnosis not present

## 2022-10-27 HISTORY — PX: HEMOSTASIS CONTROL: SHX6838

## 2022-10-27 HISTORY — PX: BRONCHIAL BRUSHINGS: SHX5108

## 2022-10-27 HISTORY — PX: VIDEO BRONCHOSCOPY: SHX5072

## 2022-10-27 HISTORY — PX: BRONCHIAL BIOPSY: SHX5109

## 2022-10-27 SURGERY — VIDEO BRONCHOSCOPY WITHOUT FLUORO
Anesthesia: General | Laterality: Right

## 2022-10-27 MED ORDER — IPRATROPIUM-ALBUTEROL 0.5-2.5 (3) MG/3ML IN SOLN
3.0000 mL | Freq: Once | RESPIRATORY_TRACT | Status: AC
  Administered 2022-10-27: 3 mL via RESPIRATORY_TRACT

## 2022-10-27 MED ORDER — ONDANSETRON HCL 4 MG/2ML IJ SOLN: 4.0000 mg | Freq: Once | INTRAMUSCULAR | Status: DC | PRN

## 2022-10-27 MED ORDER — LIDOCAINE 2% (20 MG/ML) 5 ML SYRINGE
INTRAMUSCULAR | Status: DC | PRN
  Administered 2022-10-27: 60 mg via INTRAVENOUS

## 2022-10-27 MED ORDER — ONDANSETRON HCL 4 MG/2ML IJ SOLN
INTRAMUSCULAR | Status: DC | PRN
  Administered 2022-10-27: 4 mg via INTRAVENOUS

## 2022-10-27 MED ORDER — PROPOFOL 10 MG/ML IV BOLUS
INTRAVENOUS | Status: DC | PRN
  Administered 2022-10-27: 25 mg via INTRAVENOUS
  Administered 2022-10-27: 150 mg via INTRAVENOUS

## 2022-10-27 MED ORDER — FENTANYL CITRATE (PF) 100 MCG/2ML IJ SOLN: 25.0000 ug | INTRAMUSCULAR | Status: DC | PRN

## 2022-10-27 MED ORDER — GABAPENTIN 100 MG PO CAPS: 100.0000 mg | ORAL_CAPSULE | Freq: Two times a day (BID) | ORAL | Status: DC

## 2022-10-27 MED ORDER — PHENYLEPHRINE 80 MCG/ML (10ML) SYRINGE FOR IV PUSH (FOR BLOOD PRESSURE SUPPORT)
PREFILLED_SYRINGE | INTRAVENOUS | Status: DC | PRN
  Administered 2022-10-27: 80 ug via INTRAVENOUS

## 2022-10-27 MED ORDER — SODIUM CHLORIDE (PF) 0.9 % IJ SOLN
PREFILLED_SYRINGE | INTRAMUSCULAR | Status: DC | PRN
  Administered 2022-10-27 (×2): 2 mL
  Administered 2022-10-27: 6 mL

## 2022-10-27 MED ORDER — TRIAMCINOLONE ACETONIDE 0.1 % EX CREA
1.00 | TOPICAL_CREAM | Freq: Every day | CUTANEOUS | Status: DC | PRN
Start: 2022-10-27 — End: 2023-02-03

## 2022-10-27 MED ORDER — ACETAMINOPHEN 10 MG/ML IV SOLN: 1000.0000 mg | Freq: Once | INTRAVENOUS | Status: DC | PRN

## 2022-10-27 MED ORDER — FENTANYL CITRATE (PF) 100 MCG/2ML IJ SOLN
INTRAMUSCULAR | Status: AC
  Filled 2022-10-27: qty 2

## 2022-10-27 MED ORDER — IPRATROPIUM-ALBUTEROL 0.5-2.5 (3) MG/3ML IN SOLN
RESPIRATORY_TRACT | Status: AC
  Filled 2022-10-27: qty 3

## 2022-10-27 MED ORDER — ALPRAZOLAM 0.25 MG PO TABS
0.2500 mg | ORAL_TABLET | Freq: Every day | ORAL | Status: DC
Start: 2022-10-27 — End: 2022-12-24

## 2022-10-27 MED ORDER — SUCCINYLCHOLINE CHLORIDE 200 MG/10ML IV SOSY
PREFILLED_SYRINGE | INTRAVENOUS | Status: DC | PRN
  Administered 2022-10-27: 60 mg via INTRAVENOUS

## 2022-10-27 MED ORDER — CHLORHEXIDINE GLUCONATE 0.12 % MT SOLN
OROMUCOSAL | Status: AC
  Filled 2022-10-27: qty 15

## 2022-10-27 MED ORDER — LACTATED RINGERS IV SOLN: INTRAVENOUS | Status: DC

## 2022-10-27 MED ORDER — FENTANYL CITRATE (PF) 250 MCG/5ML IJ SOLN
INTRAMUSCULAR | Status: DC | PRN
  Administered 2022-10-27 (×2): 50 ug via INTRAVENOUS

## 2022-10-27 MED ORDER — AMISULPRIDE (ANTIEMETIC) 5 MG/2ML IV SOLN: 10.0000 mg | Freq: Once | INTRAVENOUS | Status: DC | PRN

## 2022-10-27 MED ORDER — EPINEPHRINE 1 MG/10ML IJ SOSY
PREFILLED_SYRINGE | INTRAMUSCULAR | Status: AC
  Filled 2022-10-27: qty 10

## 2022-10-27 NOTE — Discharge Instructions (Signed)
Flexible Bronchoscopy, Care After This sheet gives you information about how to care for yourself after your test. Your doctor may also give you more specific instructions. If you have problems or questions, contact your doctor. Follow these instructions at home: Eating and drinking When your numbness is gone and your cough and gag reflexes have come back, you may: Eat only soft foods. Slowly drink liquids. The day after the test, go back to your normal diet. Driving Do not drive for 24 hours if you were given a medicine to help you relax (sedative). Do not drive or use heavy machinery while taking prescription pain medicine. General instructions  Take over-the-counter and prescription medicines only as told by your doctor. Return to your normal activities as told. Ask what activities are safe for you. Do not use any products that have nicotine or tobacco in them. This includes cigarettes and e-cigarettes. If you need help quitting, ask your doctor. Keep all follow-up visits as told by your doctor. This is important. It is very important if you had a tissue sample (biopsy) taken. Get help right away if: You have shortness of breath that gets worse. You get light-headed. You feel like you are going to pass out (faint). You have chest pain. You cough up: More than a little blood. More blood than before. Summary Do not eat or drink anything (not even water) for 2 hours after your test, or until your numbing medicine wears off. Do not use cigarettes. Do not use e-cigarettes. Get help right away if you have chest pain.  Please call our office for any questions or concerns.  [phone].  You may restart your aspirin 81 mg once daily on 10/28/2022.  This information is not intended to replace advice given to you by your health care provider. Make sure you discuss any questions you have with your health care provider. Document Released: 05/11/2009 Document Revised: 06/26/2017 Document  Reviewed: 08/01/2016 Elsevier Patient Education  2020 [employer].

## 2022-10-27 NOTE — Anesthesia Preprocedure Evaluation (Addendum)
Anesthesia Evaluation  Patient identified by MRN, date of birth, ID band Patient awake    Reviewed: Allergy & Precautions, NPO status , Patient's Chart, lab work & pertinent test results  Airway Mallampati: II  TM Distance: >3 FB Neck ROM: Full    Dental  (+) Edentulous Lower, Edentulous Upper   Pulmonary COPD,  COPD inhaler, former smoker   Pulmonary exam normal        Cardiovascular hypertension, Pt. on home beta blockers + CAD, + Past MI and + Peripheral Vascular Disease  Normal cardiovascular exam     Neuro/Psych  PSYCHIATRIC DISORDERS Anxiety     TIA   GI/Hepatic Neg liver ROS, hiatal hernia,GERD  Medicated and Controlled,,  Endo/Other  negative endocrine ROS    Renal/GU Renal disease     Musculoskeletal  (+) Arthritis ,    Abdominal   Peds  Hematology  (+) Blood dyscrasia, anemia   Anesthesia Other Findings right upper lobe mass  Reproductive/Obstetrics                             Anesthesia Physical Anesthesia Plan  ASA: 3  Anesthesia Plan: General   Post-op Pain Management:    Induction: Intravenous  PONV Risk Score and Plan: 3 and Ondansetron, Dexamethasone and Treatment may vary due to age or medical condition  Airway Management Planned: Oral ETT  Additional Equipment:   Intra-op Plan:   Post-operative Plan: Extubation in OR  Informed Consent: I have reviewed the patients History and Physical, chart, labs and discussed the procedure including the risks, benefits and alternatives for the proposed anesthesia with the patient or authorized representative who has indicated his/her understanding and acceptance.       Plan Discussed with: CRNA  Anesthesia Plan Comments:        Anesthesia Quick Evaluation

## 2022-10-27 NOTE — Anesthesia Procedure Notes (Signed)
Procedure Name: Intubation Date/Time: 10/27/2022 3:11 PM  Performed by: Reeves Dam, CRNAPre-anesthesia Checklist: Patient identified, Patient being monitored, Timeout performed, Emergency Drugs available and Suction available Patient Re-evaluated:Patient Re-evaluated prior to induction Oxygen Delivery Method: Circle system utilized Preoxygenation: Pre-oxygenation with 100% oxygen Induction Type: IV induction Ventilation: Mask ventilation without difficulty Laryngoscope Size: Mac and 3 Grade View: Grade I Tube type: Oral Tube size: 8.5 mm Number of attempts: 1 Airway Equipment and Method: Stylet Placement Confirmation: ETT inserted through vocal cords under direct vision, positive ETCO2 and breath sounds checked- equal and bilateral Secured at: 20 cm Tube secured with: Tape Dental Injury: Teeth and Oropharynx as per pre-operative assessment

## 2022-10-27 NOTE — Progress Notes (Signed)
RUL mass on bronchoscopy today. Bx's performed and pending. Will refer to Dr Julien Nordmann.

## 2022-10-27 NOTE — Anesthesia Postprocedure Evaluation (Signed)
Anesthesia Post Note  Patient: Jonette Mate  Procedure(s) Performed: VIDEO BRONCHOSCOPY WITHOUT FLUORO (Right) BRONCHIAL BIOPSIES BRONCHIAL BRUSHINGS HEMOSTASIS CONTROL     Patient location during evaluation: PACU Anesthesia Type: General Level of consciousness: awake Pain management: pain level controlled Vital Signs Assessment: post-procedure vital signs reviewed and stable Respiratory status: spontaneous breathing, nonlabored ventilation and respiratory function stable Cardiovascular status: blood pressure returned to baseline and stable Postop Assessment: no apparent nausea or vomiting Anesthetic complications: no  No notable events documented.  Last Vitals:  Vitals:   10/27/22 1610 10/27/22 1620  BP: (!) 158/57 (!) 167/89  Pulse: (!) 103 (!) 102  Resp: 14 20  Temp:    SpO2: 94% 94%    Last Pain:  Vitals:   10/27/22 1620  TempSrc:   PainSc: 0-No pain                 Terrilynn Postell P Harmonee Tozer

## 2022-10-27 NOTE — Op Note (Signed)
Video Bronchoscopy Procedure Note  Date of Operation: 10/27/2022  Pre-op Diagnosis: Right upper lobe mass  Post-op Diagnosis: Same  Surgeon: Baltazar Apo  Assistants: none  Anesthesia: General  Meds Given: epi 1:20000 dil 10 cc total 2 endobronchial lesion  Operation: Flexible video fiberoptic bronchoscopy and biopsies.  Estimated Blood Loss: 15 cc  Complications: none noted  Indications and History: Courtney Brown is 82 y.o. with history of tobacco use.  She was admitted to the hospital with right shoulder, subscapular pain.  Found to have a right upper lobe mass.  Treated for postobstructive pneumonia.  Recommendation was to perform video fiberoptic bronchoscopy with biopsies. The risks, benefits, complications, treatment options and expected outcomes were discussed with the patient.  The possibilities of pneumothorax, pneumonia, reaction to medication, pulmonary aspiration, perforation of a viscus, bleeding, failure to diagnose a condition and creating a complication requiring transfusion or operation were discussed with the patient who freely signed the consent.    Description of Procedure: The patient was seen in the Preoperative Area, was examined and was deemed appropriate to proceed.  The patient was taken to [facility] endoscopy room 3, identified as Courtney Brown and the procedure verified as Flexible Video Fiberoptic Bronchoscopy.  A Time Out was held and the above information confirmed.   General anesthesia initiated and patient endotracheally intubated.  The video fiberoptic bronchoscope was introduced via the ET tube and a general inspection was performed which showed normal distal trachea, normal main carina.  Left-sided airways were normal without any evidence of secretions or endobronchial lesions.  The bronchus intermedius, right middle lobe and right lower lobe airways were also normal.  There was an exophytic vascular mass emanating from the anterior and posterior  segmental airways of the right upper lobe bronchus.  The apical segmental bronchus was patent and uninvolved.  1:20000 epi was injected onto the lesion in divided doses.  Endobronchial brushing was performed on the right upper lobe mass.  Then multiple endobronchial forceps biopsies were performed to be sent for pathology.  There was some initial moderate bleeding that stopped quickly. The patient tolerated the procedure well. The bronchoscope was removed. There were no obvious complications.   Samples: 1. Endobronchial brushings from right upper lobe mass 2. Endobronchial forceps biopsies from right upper lobe mass  Plans:  We will review the cytology, pathology and microbiology results with the patient when they become available.  Outpatient followup will be with Dr Lamonte Sakai.    Baltazar Apo, MD, PhD 10/27/2022, 3:36 PM [facility] [phone] or if no answer [phone]

## 2022-10-27 NOTE — Interval H&P Note (Signed)
History and Physical Interval Note:  10/27/2022 1:17 PM  Courtney Brown  has presented today for surgery, with the diagnosis of right upper lobe mass.  The various methods of treatment have been discussed with the patient and family. After consideration of risks, benefits and other options for treatment, the patient has consented to  Procedure(s): VIDEO BRONCHOSCOPY WITHOUT FLUORO (Right) as a surgical intervention.  The patient's history has been reviewed, patient examined, no change in status, stable for surgery.  I have reviewed the patient's chart and labs.  Questions were answered to the patient's satisfaction.     Collene Gobble

## 2022-10-27 NOTE — Transfer of Care (Signed)
Immediate Anesthesia Transfer of Care Note  Patient: Courtney Brown  Procedure(s) Performed: VIDEO BRONCHOSCOPY WITHOUT FLUORO (Right) BRONCHIAL BIOPSIES BRONCHIAL BRUSHINGS HEMOSTASIS CONTROL  Patient Location: PACU  Anesthesia Type:General  Level of Consciousness: awake, alert , and oriented  Airway & Oxygen Therapy: Patient Spontanous Breathing and Patient connected to nasal cannula oxygen  Post-op Assessment: Report given to RN and Post -op Vital signs reviewed and stable  Post vital signs: Reviewed and stable  Last Vitals:  Vitals Value Taken Time  BP 183/80 10/27/22 1545  Temp 36.2 C 10/27/22 1545  Pulse 101 10/27/22 1550  Resp 20 10/27/22 1550  SpO2 99 % 10/27/22 1550  Vitals shown include unvalidated device data.  Last Pain:  Vitals:   10/27/22 1545  TempSrc: Temporal  PainSc: 0-No pain         Complications: No notable events documented.

## 2022-10-28 ENCOUNTER — Telehealth: Payer: Self-pay | Admitting: *Deleted

## 2022-10-28 ENCOUNTER — Encounter: Payer: Self-pay | Admitting: *Deleted

## 2022-10-28 NOTE — Transitions of Care (Post Inpatient/ED Visit) (Signed)
   10/28/2022  Name: Courtney Brown MRN: [id number] DOB: 05-26-1941  Today's TOC FU Call Status: Today's TOC FU Call Status:: Unsuccessul Call (1st Attempt) Unsuccessful Call (1st Attempt) Date: 10/28/22  Attempted to reach the patient regarding the most recent Inpatient visit; left HIPAA compliant voice message requesting call back  Follow Up Plan: Additional outreach attempts will be made to reach the patient to complete the Transitions of Care (Post Inpatient visit) call.   Oneta Rack, RN, BSN, CCRN Alumnus RN CM Care Coordination/ Transition of [place] Management [phone]: direct office

## 2022-10-28 NOTE — Progress Notes (Signed)
Navigator called pt to make self introduction and explain role of navigator in the course of her cancer care. Explained that there was a referral placed by Dr.Byrum for the pt to be seen here by Dr.Mohamed. Pt verbalized understanding. Pt is familiar with the location of the CC as she is already a patient of Dr.Ribakove.  Pt states she won't be able to be seen until after 4/9. Navigator let pt know she will discuss scheduling options with Dr.Mohamed and call her back in 24-48hrs. Pt verbalized understanding. No questions at this time.

## 2022-10-29 ENCOUNTER — Encounter: Payer: Self-pay | Admitting: *Deleted

## 2022-10-29 ENCOUNTER — Telehealth: Payer: Self-pay | Admitting: *Deleted

## 2022-10-29 ENCOUNTER — Telehealth: Payer: Self-pay | Admitting: Emergency Medicine

## 2022-10-29 DIAGNOSIS — C3490 Malignant neoplasm of unspecified part of unspecified bronchus or lung: Secondary | ICD-10-CM

## 2022-10-29 LAB — SURGICAL PATHOLOGY

## 2022-10-29 LAB — CYTOLOGY - NON PAP

## 2022-10-29 NOTE — Telephone Encounter (Signed)
Reviewed cytology and pathology results with the patient.  These show squamous cell lung cancer.  She has been contacted by oncology and is working on getting appointment with Dr. Julien Nordmann.  I will order a PET scan and an MRI brain now.

## 2022-10-29 NOTE — Transitions of Care (Post Inpatient/ED Visit) (Signed)
10/29/2022  Name: Courtney Brown MRN: [id number] DOB: [birth year]  Today's TOC FU Call Status: Today's TOC FU Call Status:: Successful TOC FU Call Competed TOC FU Call Complete Date: 10/29/22  Transition Care Management Follow-up Telephone Call Date of Discharge: 10/25/22 Discharge Facility: [facility]) Type of Discharge: Inpatient Admission Primary Inpatient Discharge Diagnosis:: COPD/ pneumonia; discovery of mass in (R) upper lobe of lung with suspicion of CA How have you been since you were released from the hospital?: Better ("I am doing better.  I had the procedure yesterday to have the mass biopsy, I have been okay since then, just coughing a little bit with a sore chest from coughing so much; other than that I seem to be fine.  I have my first cancer visit on 11/21/22") Any questions or concerns?: No  Items Reviewed: Did you receive and understand the discharge instructions provided?: Yes (thoroughly reviewed with patient who verbalizes good understanding of same) Medications obtained and verified?: Yes (Medications Reviewed) (Full medication review completed; no concerns or discrepancies identified; confirmed patient obtained/ is taking all newly Rx'd medications as instructed; self-manages medications and denies questions/ concerns around medications today) Any new allergies since your discharge?: No Dietary orders reviewed?: Yes Type of Diet Ordered:: Heart Healthy Do you have support at home?: Yes People in Home: child(ren), adult Name of Support/Comfort Primary Source: resides with adult daughter; reports essentially independent in self-care activities; daughter works during day, assists as indicated/ needed when not at work  Home Care and Equipment/Supplies: [place] Ordered?: No Any new equipment or medical supplies ordered?: No  Functional Questionnaire: Do you need assistance with bathing/showering or dressing?: No Do you need assistance with meal  preparation?: No Do you need assistance with eating?: No Do you have difficulty maintaining continence: No Do you need assistance with getting out of bed/getting out of a chair/moving?: No Do you have difficulty managing or taking your medications?: No  Follow up appointments reviewed: PCP Follow-up appointment confirmed?: Yes Date of PCP follow-up appointment?: 11/03/22 Follow-up Provider: PCP [facility] Follow-up appointment confirmed?: Yes Date of Specialist follow-up appointment?: 11/21/22 Follow-Up Specialty Provider:: oncology provider- initial visit for lung CA Do you need transportation to your follow-up appointment?: No Do you understand care options if your condition(s) worsen?: Yes-patient verbalized understanding  SDOH Interventions Today    Flowsheet Row Most Recent Value  SDOH Interventions   Food Insecurity Interventions Intervention Not Indicated  Transportation Interventions Intervention Not Indicated  [normally drives self,  family friends assist as/ if needed/ indicated]      TOC Interventions Today    Flowsheet Row Most Recent Value  TOC Interventions   TOC Interventions Discussed/Reviewed TOC Interventions Discussed, S/S of infection  [provided my direct contact information should questions/ concerns/ needs arise post-TOC call, prior to RN CM telephone visit]      Interventions Today    Flowsheet Row Most Recent Value  Chronic Disease   Chronic disease during today's visit Chronic Obstructive Pulmonary Disease (COPD), Other  [new discovery of (R) UL mass, suspicious of lung CA- had diagnostic bronchoscopy post- hospital discharge on 10/27/22]  General Interventions   General Interventions Discussed/Reviewed General Interventions Discussed, Communication with, Referral to Nurse, Doctor Visits  Doctor Visits Discussed/Reviewed Doctor Visits Discussed, PCP, Specialist  PCP/Specialist Visits Compliance with follow-up visit  Communication with RN   Nutrition Interventions   Nutrition Discussed/Reviewed Nutrition Discussed  Pharmacy Interventions   Pharmacy Dicussed/Reviewed Pharmacy Topics Discussed  [Full medication review with updating medication  list in EHR per patient report]  Safety Interventions   Safety Discussed/Reviewed Safety Discussed, Safety Reviewed  [confirmed has/ uses cane and walker- uses as needed, mainly when out of her home,  does not need to use on a regular basis]      Oneta Rack, RN, BSN, CCRN Alumnus RN CM Care Coordination/ Transition of [place] Management [phone]: direct office

## 2022-10-30 NOTE — Progress Notes (Signed)
Navigator called pt at 11:51 to offer an appointment on 4/8 @ 8am or 4/10 @ 8am with Cassie. Pt already has an appt on each of those days, however she state she is able to come on 4/9 @ 930 for an appt with Cassie. Pt was initially concerned about transportation, but states she can have her friend drive her to the appt if she does not feel up to driving herself. Pt is a pt of Dr Federico Flake and it aware of the location of the CC. Pt understands she will need a lab appt at [place] provided desk number to the pt and ask that she make contact if she can't make it to her appt. Notified pt that a CC scheduler will be calling her to confirm that the appts have been booked. Pt verbalized understanding.  Scheduler notified via [place] at 12:11 and requested she create a 1hr slot at 9:30 on 4/9 with Cassie. Scheduler verified request.

## 2022-10-31 ENCOUNTER — Other Ambulatory Visit: Payer: Self-pay

## 2022-10-31 DIAGNOSIS — R918 Other nonspecific abnormal finding of lung field: Secondary | ICD-10-CM

## 2022-11-02 NOTE — Progress Notes (Unsigned)
[facility] [phone]   [phone]  CONSULT NOTE  REFERRING PHYSICIAN: Dr. Delton Coombes  REASON FOR CONSULTATION:  Squamous Cell Carcinoma   HPI Courtney Brown is a 82 y.o. female with past medical history significant for COPD, hypertension, anemia, coronary artery disease, history of melanoma not currently undergoing treatment, CKD, peripheral vascular disease is referred to the clinic for newly diagnosed NSCLC, squamous cell carcinoma.   The patient's workup began after she presented to the emergency room on 10/23/2022 for the chief complaint of shortness of breath.  She also had some associated right-sided chest pain radiating to the upper back and was concerned about possible effusion her symptoms had been presented for approximately 1 to 2 weeks with associated cough.  She had a chest x-ray performed in the emergency room which showed a large right upper lobe opacity concerning for lung malignancy and she subsequently had a CT scan of the chest that showed right upper lobe central mass with occlusion and probable postobstructive pneumonia bone metastasis to the right fourth rib, and spiculated densities in the left upper lobe concerning for metastatic disease.  Dr. Delton Coombes subsequently arranged for bronchoscopy and biopsy on 10/27/2022.  The final pathology ([id number])  showed poorly differentiated squamous cell carcinoma  She is scheduled for staging brain MRI on 11/05/2022 and her PET scan is scheduled for 11/20/2022  Overall, the patient is feeling ***   HPI  Past Medical History:  Diagnosis Date   Allergy    Anxiety    Arthritis    Cancer    hx of melanoma    Colon polyps    COPD (chronic obstructive pulmonary disease)    Coronary artery disease    Dyspnea    with exertion    Edema    in lower extremties    Emphysema of lung    GERD (gastroesophageal reflux disease)    H/O hiatal hernia    Hyperlipidemia    Hypertension    Inverted  nipple    bilateral, pt says that's normal   Malignant melanoma 1983   again in 1995]   Myocardial infarct, old 2008   no stent placed   Osteoporosis    Pneumonia    hx of    Sepsis    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    Stroke    TIA    Past Surgical History:  Procedure Laterality Date   ABDOMINAL HYSTERECTOMY  1998   BRONCHIAL BIOPSY  10/27/2022   Procedure: BRONCHIAL BIOPSIES;  Surgeon: Leslye Peer, MD;  Location: [facility] ENDOSCOPY;  Service: Cardiopulmonary;;   BRONCHIAL BRUSHINGS  10/27/2022   Procedure: BRONCHIAL BRUSHINGS;  Surgeon: Leslye Peer, MD;  Location: [facility] ENDOSCOPY;  Service: Cardiopulmonary;;   CHOLECYSTECTOMY  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   HEMOSTASIS CONTROL  10/27/2022   Procedure: HEMOSTASIS CONTROL;  Surgeon: Leslye Peer, MD;  Location: [facility] ENDOSCOPY;  Service: Cardiopulmonary;;   HERNIA REPAIR     umbilical, ventral   INCISIONAL HERNIA REPAIR N/A 10/26/2020   Procedure: DIAGNOSTIC LAPAROSCOPY MESH EXPLANTATION; SMALL BOWEL RESECTION; PRIMARY REPAIR OF INCISIONAL HERNIA;  Surgeon: Sheliah Hatch De Blanch, MD;  Location: [facility] ORS;  Service: General;  Laterality: N/A;   INSERTION OF ILIAC STENT  2008   VIDEO BRONCHOSCOPY Right 10/27/2022   Procedure: VIDEO BRONCHOSCOPY WITHOUT FLUORO;  Surgeon: Leslye Peer, MD;  [facility] ENDOSCOPY;  Service: Cardiopulmonary;  Laterality: Right;  Family History  Problem Relation Age of Onset   Hearing loss Father    Hearing loss Maternal Aunt    Hearing loss Maternal Grandmother    Cancer Maternal Grandmother        colon   Arthritis Mother    Cancer Paternal Grandmother        colon   Breast cancer Neg Hx     Social History Social History   Tobacco Use   Smoking status: Former    Packs/day: 1.50    Years: 44.00    Additional pack years: 0.00    Total pack years: 66.00    Types: Cigarettes    Quit date: 07/28/2005    Years since quitting: 17.2    Smokeless tobacco: Never  Vaping Use   Vaping Use: Never used  Substance Use Topics   Alcohol use: Yes    Alcohol/week: 2.0 standard drinks of alcohol    Types: 2 Shots of liquor per week    Comment: rarely   Drug use: No    Allergies  Allergen Reactions   Prednisone Other (See Comments)    Leg pain,light headed   Statins     Myalgias- per patient, she feels better off the medication    Current Outpatient Medications  Medication Sig Dispense Refill   acetaminophen (TYLENOL) 500 MG tablet Take 500 mg by mouth every 6 (six) hours as needed for mild pain or moderate pain.     albuterol (VENTOLIN HFA) 108 (90 Base) MCG/ACT inhaler INHALE 1-2 PUFFS BY MOUTH EVERY 6 HOURS AS NEEDED FOR WHEEZE OR SHORTNESS OF BREATH 6.7 each 3   ALPRAZolam (XANAX) 0.25 MG tablet Take 1 tablet (0.25 mg total) by mouth at bedtime.     aspirin EC 81 MG tablet Take 81 mg by mouth daily. Swallow whole.     budesonide-formoterol (SYMBICORT) 160-4.5 MCG/ACT inhaler TAKE 2 PUFFS BY MOUTH TWICE A DAY 30.6 each 1   Calcium Citrate-Vitamin D (CITRACAL + D PO) Take 1 tablet by mouth in the morning. 600 mg calcium     colesevelam (WELCHOL) 625 MG tablet TAKE 1 TABLET (625 MG TOTAL) BY MOUTH 2 (TWO) TIMES DAILY WITH A MEAL. 180 tablet 1   cyanocobalamin (VITAMIN B12) 1000 MCG tablet Take 1,000 mcg by mouth daily.     fenofibrate (TRICOR) 145 MG tablet TAKE 1 TABLET BY MOUTH EVERY DAY 90 tablet 1   Ferric Maltol (ACCRUFER) 30 MG CAPS Take 1 capsule by mouth in the morning and at bedtime. 180 capsule 0   folic acid (FOLVITE) 1 MG tablet TAKE 1 TABLET BY MOUTH EVERY DAY 90 tablet 1   furosemide (LASIX) 20 MG tablet Take 20 mg by mouth daily.     gabapentin (NEURONTIN) 100 MG capsule Take 1-3 capsules (100-300 mg total) by mouth 2 (two) times daily. Take 1 capsule (100 mg) in the morning and Take 3 capsules (300 mg) at bedtime     Garlic Oil 1000 MG CAPS Take 1,000 mg by mouth daily.     HYDROcodone-acetaminophen  (NORCO/VICODIN) 5-325 MG tablet Take 1 tablet by mouth every 4 (four) hours as needed for moderate pain. 30 tablet 0   Lactobacillus (PROBIOTIC ACIDOPHILUS) CAPS Take 1 tablet by mouth daily.     loratadine (CLARITIN) 10 MG tablet TAKE 1 TABLET BY MOUTH EVERY DAY 90 tablet 1   magnesium oxide (MAG-OX) 400 (240 Mg) MG tablet TAKE 1 TABLET BY MOUTH TWICE A DAY 60 tablet 2   metoprolol succinate (  TOPROL-XL) 50 MG 24 hr tablet Take 1 tablet (50 mg total) by mouth daily. 90 tablet 0   mometasone (NASONEX) 50 MCG/ACT nasal spray PLACE 2 SPRAYS INTO THE NOSE DAILY. 51 each 3   omeprazole (PRILOSEC) 20 MG capsule TAKE 1 CAPSULE BY MOUTH EVERY DAY 90 capsule 1   ondansetron (ZOFRAN) 4 MG tablet Take 1 tablet (4 mg total) by mouth every 8 (eight) hours as needed for nausea or vomiting. 20 tablet 0   potassium chloride SA (KLOR-CON M) 20 MEQ tablet Take 2 tablets (40 mEq total) by mouth daily. 30 tablet 0   primidone (MYSOLINE) 50 MG tablet Take 1 tablet (50 mg total) by mouth at bedtime. 90 tablet 3   raloxifene (EVISTA) 60 MG tablet TAKE 1 TABLET BY MOUTH EVERY DAY 90 tablet 1   tiotropium (SPIRIVA) 18 MCG inhalation capsule Place 18 mcg into inhaler and inhale daily.     triamcinolone cream (KENALOG) 0.1 % Apply 1 Application topically daily as needed (yeast infection under breast).     Zinc 50 MG TABS TAKE 1 TABLET BY MOUTH EVERY DAY 90 tablet 1   No current facility-administered medications for this visit.    REVIEW OF SYSTEMS:   Review of Systems  Constitutional: Negative for appetite change, chills, fatigue, fever and unexpected weight change.  HENT:   Negative for mouth sores, nosebleeds, sore throat and trouble swallowing.   Eyes: Negative for eye problems and icterus.  Respiratory: Negative for cough, hemoptysis, shortness of breath and wheezing.   Cardiovascular: Negative for chest pain and leg swelling.  Gastrointestinal: Negative for abdominal pain, constipation, diarrhea, nausea and  vomiting.  Genitourinary: Negative for bladder incontinence, difficulty urinating, dysuria, frequency and hematuria.   Musculoskeletal: Negative for back pain, gait problem, neck pain and neck stiffness.  Skin: Negative for itching and rash.  Neurological: Negative for dizziness, extremity weakness, gait problem, headaches, light-headedness and seizures.  Hematological: Negative for adenopathy. Does not bruise/bleed easily.  Psychiatric/Behavioral: Negative for confusion, depression and sleep disturbance. The patient is not nervous/anxious.     PHYSICAL EXAMINATION:  There were no vitals taken for this visit.  ECOG PERFORMANCE STATUS: {CHL ONC ECOG [id number]  Physical Exam  Constitutional: Oriented to person, place, and time and well-developed, well-nourished, and in no distress. No distress.  HENT:  Head: Normocephalic and atraumatic.  Mouth/Throat: Oropharynx is clear and moist. No oropharyngeal exudate.  Eyes: Conjunctivae are normal. Right eye exhibits no discharge. Left eye exhibits no discharge. No scleral icterus.  Neck: Normal range of motion. Neck supple.  Cardiovascular: Normal rate, regular rhythm, normal heart sounds and intact distal pulses.   Pulmonary/Chest: Effort normal and breath sounds normal. No respiratory distress. No wheezes. No rales.  Abdominal: Soft. Bowel sounds are normal. Exhibits no distension and no mass. There is no tenderness.  Musculoskeletal: Normal range of motion. Exhibits no edema.  Lymphadenopathy:    No cervical adenopathy.  Neurological: Alert and oriented to person, place, and time. Exhibits normal muscle tone. Gait normal. Coordination normal.  Skin: Skin is warm and dry. No rash noted. Not diaphoretic. No erythema. No pallor.  Psychiatric: Mood, memory and judgment normal.  Vitals reviewed.  LABORATORY DATA: Lab Results  Component Value Date   WBC 15.4 (H) 10/25/2022   HGB 9.1 (L) 10/25/2022   HCT 29.7 (L) 10/25/2022   MCV 84.1  10/25/2022   PLT 632 (H) 10/25/2022      Chemistry      Component Value Date/Time  NA 135 10/25/2022 0406   NA 144 12/01/2016 0000   K 3.9 10/25/2022 0406   CL 103 10/25/2022 0406   CO2 24 10/25/2022 0406   BUN 11 10/25/2022 0406   BUN 20 12/01/2016 0000   CREATININE 0.60 10/25/2022 0406   CREATININE 0.89 08/15/2022 1415   GLU 74 12/01/2016 0000      Component Value Date/Time   CALCIUM 9.4 10/25/2022 0406   ALKPHOS 52 08/15/2022 1415   AST 13 (L) 08/15/2022 1415   ALT 7 08/15/2022 1415   BILITOT 0.4 08/15/2022 1415       RADIOGRAPHIC STUDIES: CT Angio Chest PE W and/or Wo Contrast  Result Date: 10/23/2022 CLINICAL DATA:  Large right upper lobe mass EXAM: CT ANGIOGRAPHY CHEST WITH CONTRAST TECHNIQUE: Multidetector CT imaging of the chest was performed using the standard protocol during bolus administration of intravenous contrast. Multiplanar CT image reconstructions and MIPs were obtained to evaluate the vascular anatomy. RADIATION DOSE REDUCTION: This exam was performed according to the departmental dose-optimization program which includes automated exposure control, adjustment of the mA and/or kV according to patient size and/or use of iterative reconstruction technique. CONTRAST:  100mL OMNIPAQUE IOHEXOL 350 MG/ML SOLN COMPARISON:  Chest x-ray from earlier in the same day. FINDINGS: Cardiovascular: Atherosclerotic calcifications of the aorta are noted without aneurysmal dilatation. The pulmonary artery shows a normal branching pattern bilaterally. No defect to suggest pulmonary embolism is noted. Cardiac shadow is at the upper limits of normal size. Mediastinum/Nodes: Thoracic inlet is within normal limits. Small right paratracheal and subcarinal lymph nodes are noted but not significant by size criteria. No left hilar adenopathy is noted. The right hilum is somewhat obscured due to central soft tissue density. The esophagus as visualized is within normal limits. Lungs/Pleura:  Diffuse emphysematous changes are noted. Some spiculated appearing areas are noted in the anterior aspect of the left upper lobe best seen on image number 30 and 31 of series 11. The largest of these measures up to 11 mm. Small right-sided pleural effusion is noted. Mild right basilar atelectasis is seen. There is a large geographic area of soft tissue density identified in the right upper lobe with mass effect upon the major fissure. The majority of the right upper lobe bronchial branches are occluded centrally likely related to central hilar mass. There is slight increased density noted adjacent to the bronchial occlusion which measures approximately 3 cm in greatest dimension and consistent with underlying mass. It is difficult to assess how much of the more peripheral consolidation is related to mass or collapsed lung. Upper Abdomen: Scattered simple cysts are noted within the kidneys. No follow-up is recommended. No other focal abnormality is noted the upper abdomen. Musculoskeletal: Lytic lesion is noted in the lateral aspect of the right fourth rib consistent with metastatic disease. No pathologic fracture is seen. No other rib abnormality is noted. No other definitive lytic or sclerotic lesions are seen. Review of the MIP images confirms the above findings. 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 Referral to multi disciplinary pulmonary team is recommended. Tissue sampling and likely PET-CT are also recommended for further evaluation. 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 Spiculated densities in the left upper lobe which may represent metastatic disease. No evidence of pulmonary emboli. Aortic Atherosclerosis (ICD10-I70.0) and Emphysema (ICD10-J43.9). Electronically Signed   By: Alcide CleverMark  Lukens M.D.   On: 10/23/2022 19:51   DG Chest 2 View  Result Date: 10/23/2022 CLINICAL DATA:  Shortness  of breath. EXAM: CHEST - 2 VIEW COMPARISON:  January 18, 2018. FINDINGS: The heart size and mediastinal contours are within normal limits. Left lung is clear. Large rounded mass is noted in right upper lobe highly concerning for malignancy, with probable associated postobstructive atelectasis. Minimal right pleural effusion is noted. The visualized skeletal structures are unremarkable. IMPRESSION: Large rounded mass is noted in right upper lobe highly concerning for malignancy. CT scan of the chest is recommended for further evaluation. Electronically Signed   By: Lupita Raider M.D.   On: 10/23/2022 16:39    ASSESSMENT: This is a very pleasant 82 year old Caucasian female referred to the clinic for stage ***non-small cell lung cancer, squamous cell carcinoma.  She presented with a right upper lobe lung mass with bone metastasis to the right fourth rib, and spiculated densities in the left upper lobe concerning for metastatic disease.  Pending further staging workup with PET scan and brain MRI which is scheduled for 4/10 and 4/25. ***Send molecular's  Dr. [name] had a lengthly discussion with the patient today about her current condition and treatment options. The patient was given the option of a referral to hospice/palliative vs. Treatment with systemic chemotherapy with carboplatin for an AUC of 5, taxol 175 mg/m, and Keytruda 200 mg IV every 3 weeks.  The patient is interested in proceeding with systemic chemotherapy.  She is expected to start her first dose of this treatment on __.  We discussed the adverse side effects of treatment including but not limited to alopecia, myelosuppression, nausea and vomiting, peripheral neuropathy, liver or renal dysfunction as well as immunotherapy mediated adverse effects.   I will arrange for the patient to have a chemoeducation class prior to receiving her first cycle of chemotherapy.    I sent prescriptions for  Compazine 10 mg every 6 hours as needed for  nausea.   The patient will follow-up in 2 weeks for a one-week follow-up visit after completing her first cycle of chemotherapy.  Get scans as scheduled.   The patient was advised to call immediately if she has any concerning symptoms in the interval. The patient voices understanding of current disease status and treatment options and is in agreement with the current care plan. All questions were answered. The patient knows to call the clinic with any problems, questions or concerns. We can certainly see the patient much sooner if necessary    The patient voices understanding of current disease status and treatment options and is in agreement with the current care plan.  All questions were answered. The patient knows to call the clinic with any problems, questions or concerns. We can certainly see the patient much sooner if necessary.  Thank you so much for allowing me to participate in the care of Tashina Tapia. I will continue to follow up the patient with you and assist in her care.  I spent {CHL ONC TIME VISIT - [id number]:[id number]} counseling the patient face to face. The total time spent in the appointment was {CHL ONC TIME VISIT - YQIHK:[id number]}.  Disclaimer: This note was dictated with voice recognition software. Similar sounding words can inadvertently be transcribed and may not be corrected upon review.   Mada Sadik L Serrena Linderman November 02, 2022, 10:30 AM

## 2022-11-03 ENCOUNTER — Encounter: Payer: Self-pay | Admitting: Internal Medicine

## 2022-11-03 ENCOUNTER — Ambulatory Visit (INDEPENDENT_AMBULATORY_CARE_PROVIDER_SITE_OTHER): Payer: 59 | Admitting: Internal Medicine

## 2022-11-03 VITALS — BP 118/60 | HR 100 | Temp 98.2°F | Ht 60.0 in | Wt 139.0 lb

## 2022-11-03 DIAGNOSIS — G893 Neoplasm related pain (acute) (chronic): Secondary | ICD-10-CM

## 2022-11-03 DIAGNOSIS — I10 Essential (primary) hypertension: Secondary | ICD-10-CM

## 2022-11-03 DIAGNOSIS — J9612 Chronic respiratory failure with hypercapnia: Secondary | ICD-10-CM | POA: Diagnosis not present

## 2022-11-03 DIAGNOSIS — C3491 Malignant neoplasm of unspecified part of right bronchus or lung: Secondary | ICD-10-CM | POA: Diagnosis not present

## 2022-11-03 NOTE — Progress Notes (Unsigned)
Subjective:  Patient ID: Courtney Brown, female    DOB: 10-27-1940  Age: 82 y.o. MRN: [id number]  CC: No chief complaint on file.   HPI Courtney Brown presents for ***  Outpatient Medications Prior to Visit  Medication Sig Dispense Refill   acetaminophen (TYLENOL) 500 MG tablet Take 500 mg by mouth every 6 (six) hours as needed for mild pain or moderate pain.     albuterol (VENTOLIN HFA) 108 (90 Base) MCG/ACT inhaler INHALE 1-2 PUFFS BY MOUTH EVERY 6 HOURS AS NEEDED FOR WHEEZE OR SHORTNESS OF BREATH 6.7 each 3   ALPRAZolam (XANAX) 0.25 MG tablet Take 1 tablet (0.25 mg total) by mouth at bedtime.     aspirin EC 81 MG tablet Take 81 mg by mouth daily. Swallow whole.     budesonide-formoterol (SYMBICORT) 160-4.5 MCG/ACT inhaler TAKE 2 PUFFS BY MOUTH TWICE A DAY 30.6 each 1   Calcium Citrate-Vitamin D (CITRACAL + D PO) Take 1 tablet by mouth in the morning. 600 mg calcium     colesevelam (WELCHOL) 625 MG tablet TAKE 1 TABLET (625 MG TOTAL) BY MOUTH 2 (TWO) TIMES DAILY WITH A MEAL. 180 tablet 1   cyanocobalamin (VITAMIN B12) 1000 MCG tablet Take 1,000 mcg by mouth daily.     fenofibrate (TRICOR) 145 MG tablet TAKE 1 TABLET BY MOUTH EVERY DAY 90 tablet 1   Ferric Maltol (ACCRUFER) 30 MG CAPS Take 1 capsule by mouth in the morning and at bedtime. 180 capsule 0   folic acid (FOLVITE) 1 MG tablet TAKE 1 TABLET BY MOUTH EVERY DAY 90 tablet 1   furosemide (LASIX) 20 MG tablet Take 20 mg by mouth daily.     gabapentin (NEURONTIN) 100 MG capsule Take 1-3 capsules (100-300 mg total) by mouth 2 (two) times daily. Take 1 capsule (100 mg) in the morning and Take 3 capsules (300 mg) at bedtime     Garlic Oil 1000 MG CAPS Take 1,000 mg by mouth daily.     HYDROcodone-acetaminophen (NORCO/VICODIN) 5-325 MG tablet Take 1 tablet by mouth every 4 (four) hours as needed for moderate pain. 30 tablet 0   Lactobacillus (PROBIOTIC ACIDOPHILUS) CAPS Take 1 tablet by mouth daily.     loratadine (CLARITIN) 10 MG tablet  TAKE 1 TABLET BY MOUTH EVERY DAY 90 tablet 1   magnesium oxide (MAG-OX) 400 (240 Mg) MG tablet TAKE 1 TABLET BY MOUTH TWICE A DAY 60 tablet 2   metoprolol succinate (TOPROL-XL) 50 MG 24 hr tablet Take 1 tablet (50 mg total) by mouth daily. 90 tablet 0   mometasone (NASONEX) 50 MCG/ACT nasal spray PLACE 2 SPRAYS INTO THE NOSE DAILY. 51 each 3   omeprazole (PRILOSEC) 20 MG capsule TAKE 1 CAPSULE BY MOUTH EVERY DAY 90 capsule 1   ondansetron (ZOFRAN) 4 MG tablet Take 1 tablet (4 mg total) by mouth every 8 (eight) hours as needed for nausea or vomiting. 20 tablet 0   potassium chloride SA (KLOR-CON M) 20 MEQ tablet Take 2 tablets (40 mEq total) by mouth daily. 30 tablet 0   primidone (MYSOLINE) 50 MG tablet Take 1 tablet (50 mg total) by mouth at bedtime. 90 tablet 3   raloxifene (EVISTA) 60 MG tablet TAKE 1 TABLET BY MOUTH EVERY DAY 90 tablet 1   tiotropium (SPIRIVA) 18 MCG inhalation capsule Place 18 mcg into inhaler and inhale daily.     triamcinolone cream (KENALOG) 0.1 % Apply 1 Application topically daily as needed (yeast infection under breast).  Zinc 50 MG TABS TAKE 1 TABLET BY MOUTH EVERY DAY 90 tablet 1   No facility-administered medications prior to visit.    ROS Review of Systems  Objective:  BP 118/60 (BP Location: Left Arm, Patient Position: Sitting, Cuff Size: Large)   Pulse 100   Temp 98.2 F (36.8 C) (Oral)   Ht 5' (1.524 m)   Wt 139 lb (63 kg)   SpO2 95%   BMI 27.15 kg/m   BP Readings from Last 3 Encounters:  11/03/22 118/60  10/27/22 (!) 167/89  10/25/22 (!) 140/61    Wt Readings from Last 3 Encounters:  11/03/22 139 lb (63 kg)  10/27/22 136 lb (61.7 kg)  10/23/22 137 lb 2 oz (62.2 kg)    Physical Exam  Lab Results  Component Value Date   WBC 15.4 (H) 10/25/2022   HGB 9.1 (L) 10/25/2022   HCT 29.7 (L) 10/25/2022   PLT 632 (H) 10/25/2022   GLUCOSE 95 10/25/2022   CHOL 145 08/21/2021   TRIG 207.0 (H) 08/21/2021   HDL 41.70 08/21/2021   LDLDIRECT  81.0 08/21/2021   LDLCALC 74 07/10/2020   ALT 7 08/15/2022   AST 13 (L) 08/15/2022   NA 135 10/25/2022   K 3.9 10/25/2022   CL 103 10/25/2022   CREATININE 0.60 10/25/2022   BUN 11 10/25/2022   CO2 24 10/25/2022   TSH 2.60 02/21/2022   HGBA1C 5.7 08/21/2021    CT Angio Chest PE W and/or Wo Contrast  Result Date: 10/23/2022 CLINICAL DATA:  Large right upper lobe mass EXAM: CT ANGIOGRAPHY CHEST WITH CONTRAST TECHNIQUE: Multidetector CT imaging of the chest was performed using the standard protocol during bolus administration of intravenous contrast. Multiplanar CT image reconstructions and MIPs were obtained to evaluate the vascular anatomy. RADIATION DOSE REDUCTION: This exam was performed according to the departmental dose-optimization program which includes automated exposure control, adjustment of the mA and/or kV according to patient size and/or use of iterative reconstruction technique. CONTRAST:  OMNIPAQUE IOHEXOL 350 MG/ML SOLN COMPARISON:  Chest x-ray from earlier in the same day. FINDINGS: Cardiovascular: Atherosclerotic calcifications of the aorta are noted without aneurysmal dilatation. The pulmonary artery shows a normal branching pattern bilaterally. No defect to suggest pulmonary embolism is noted. Cardiac shadow is at the upper limits of normal size. Mediastinum/Nodes: Thoracic inlet is within normal limits. Small right paratracheal and subcarinal lymph nodes are noted but not significant by size criteria. No left hilar adenopathy is noted. The right hilum is somewhat obscured due to central soft tissue density. The esophagus as visualized is within normal limits. Lungs/Pleura: Diffuse emphysematous changes are noted. Some spiculated appearing areas are noted in the anterior aspect of the left upper lobe best seen on image number 30 and 31 of series 11. The largest of these measures up to 11 mm. Small right-sided pleural effusion is noted. Mild right basilar atelectasis is seen.  There is a large geographic area of soft tissue density identified in the right upper lobe with mass effect upon the major fissure. The majority of the right upper lobe bronchial branches are occluded centrally likely related to central hilar mass. There is slight increased density noted adjacent to the bronchial occlusion which measures approximately 3 cm in greatest dimension and consistent with underlying mass. It is difficult to assess how much of the more peripheral consolidation is related to mass or collapsed lung. Upper Abdomen: Scattered simple cysts are noted within the kidneys. No follow-up is recommended. No other focal  abnormality is noted the upper abdomen. Musculoskeletal: Lytic lesion is noted in the lateral aspect of the right fourth rib consistent with metastatic disease. No pathologic fracture is seen. No other rib abnormality is noted. No other definitive lytic or sclerotic lesions are seen. Review of the MIP images confirms the above findings. 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 Referral to multi disciplinary pulmonary team is recommended. Tissue sampling and likely PET-CT are also recommended for further evaluation. 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 Spiculated densities in the left upper lobe which may represent metastatic disease. No evidence of pulmonary emboli. Aortic Atherosclerosis (ICD10-I70.0) and Emphysema (ICD10-J43.9). Electronically Signed   By: Alcide CleverMark  Lukens M.D.   On: 10/23/2022 19:51   DG Chest 2 View  Result Date: 10/23/2022 CLINICAL DATA:  Shortness of breath. EXAM: CHEST - 2 VIEW COMPARISON:  January 18, 2018. FINDINGS: The heart size and mediastinal contours are within normal limits. Left lung is clear. Large rounded mass is noted in right upper lobe highly concerning for malignancy, with probable associated postobstructive atelectasis. Minimal right  pleural effusion is noted. The visualized skeletal structures are unremarkable. IMPRESSION: Large rounded mass is noted in right upper lobe highly concerning for malignancy. CT scan of the chest is recommended for further evaluation. Electronically Signed   By: Lupita RaiderJames  Green Jr M.D.   On: 10/23/2022 16:39     Assessment & Plan:  There are no diagnoses linked to this encounter.   Follow-up: No follow-ups on file.  Sanda Lingerhomas Tommy Goostree, MD

## 2022-11-03 NOTE — Patient Instructions (Signed)
Lung Cancer Lung cancer is an abnormal growth of cancerous cells that forms a mass (malignant tumor) in a lung. There are several types of lung cancer. The types are based on the appearance of the tumor cells. The two most common types are: Non-small cell lung cancer. This type of lung cancer is the most common type. Non-small cell lung cancers include squamous cell carcinoma, adenocarcinoma, and large cell carcinoma. Small cell lung cancer. In this type of lung cancer, abnormal cells are smaller than those of non-small cell lung cancer. Small cell lung cancer gets worse (progresses) faster than non-small cell lung cancer. What are the causes? The most common cause of lung cancer is smoking tobacco. The second most common cause is exposure to a chemical called radon. What increases the risk? You are more likely to develop this condition if: You smoke tobacco. You have been exposed to: Secondhand tobacco smoke. Radon gas. Uranium. Asbestos. Arsenic in drinking water. Air pollution and diesel exhaust. You have a family or personal history of lung cancer. You have had lung radiation therapy in the past. You are older than age 65. What are the signs or symptoms? In the early stages, you may not have any symptoms. As the cancer progresses, symptoms may include: A lasting cough, possibly with blood. Fatigue. Unexplained weight loss. Shortness of breath. High-pitched whistling sounds when you breathe, most often when you breathe out (wheezing). Chest pain. Loss of appetite. Symptoms of advanced lung cancer include: Hoarseness. Bone or joint pain. Weakness. Change in the structure of the fingernails (clubbing), so that the nail looks like an upside-down spoon. Swelling of the face or arms. Inability to move the face (paralysis). Drooping eyelids. How is this diagnosed? This condition may be diagnosed based on: Your symptoms and medical history. A physical exam. A chest X-ray. A CT  scan. Blood tests. Sputum tests. Removal of a sample of lung tissue (lung biopsy) for testing. Your cancer will be assessed (staged) to determine how severe it is and how much it has spread (metastasized). How is this treated? Treatment depends on the type and stage of your cancer. Treatment may include one or more of the following: Surgery to remove as much of the cancer as possible. Lymph nodes in the area may be removed and tested for cancer as well. Medicines that kill cancer cells (chemotherapy). High-energy rays that kill cancer cells (radiation therapy). Targeted therapy. This targets specific parts of cancer cells and the area around them to block the growth and spread of the cancer. Targeted therapy can help limit the damage to healthy cells. Immunotherapy. This treatment uses a person's own immune system to fight cancer by either boosting the immune system or changing how the immune system works. Follow these instructions at home:  Do not use any products that contain nicotine or tobacco. These products include cigarettes, chewing tobacco, and vaping devices, such as e-cigarettes. If you need help quitting, ask your health care provider. Do not drink alcohol. If you are admitted to the hospital, make sure your cancer specialist (oncologist) is aware. Your cancer may affect your treatment for other conditions. Take over-the-counter and prescription medicines only as told by your health care provider. Work with your health care provider to manage any side effects of treatment. Keep all follow-up visits. This is important. Where to find support Consider joining a local support group for people who have been diagnosed with lung cancer. Where to find more information American Cancer Society: www.cancer.org National Cancer Institute (NCI):   www.cancer.gov Contact a health care provider if you: Lose weight without trying. Have a persistent cough and wheezing. Feel short of breath. Get  tired easily. Have bone or joint pain. Have difficulty swallowing. Notice that your voice is changing or getting hoarse. Have pain that does not get better with medicine. Get help right away if you: Cough up blood. Have chest pain or new breathing problems. Have a fever. Have swelling in an ankle, leg, or arm, or the face or neck. Have paralysis in your face. Are very confused. Have a drooping eyelid. These symptoms may represent a serious problem that is an emergency. Do not wait to see if the symptoms will go away. Get medical help right away. Call your local emergency services (911 in the [place] Do not drive yourself to the hospital. Summary Lung cancer is an abnormal growth of cancerous cells that forms a mass (malignant tumor) in a lung. There are several types of lung cancer. The types are based on the appearance of the tumor cells. The two most common types are non-small cell and small cell. The most common cause of lung cancer is smoking tobacco. Early symptoms include a lasting cough, possibly with blood, and fatigue, unexplained weight loss, and shortness of breath. After diagnosis, treatment depends on the type and stage of your cancer. This information is not intended to replace advice given to you by your health care provider. Make sure you discuss any questions you have with your health care provider. Document Revised: 01/02/2021 Document Reviewed: 01/02/2021 Elsevier Patient Education  2023 Elsevier Inc.  

## 2022-11-03 NOTE — Progress Notes (Signed)
Pt wanted clarification about her upcoming MRI and PET scan. Pt was under the impression that she would have to fast for her MRI. Navigator clarified with the pt that the MRI does not require fasting, but the PET scan does require fasting for 6hrs prior to exam and she can only have water. Navigator confirmed the date, times and locations for both exams. Pt read back information Navigator provided. Navigator also reminded the pt of her lab appointment and her consult appointment with Dr.Mohamed tomorrow 4/9 @ 9:00 for labs and 9:30 for her consult. Pt verbalized understanding.

## 2022-11-04 ENCOUNTER — Inpatient Hospital Stay: Payer: 59

## 2022-11-04 ENCOUNTER — Inpatient Hospital Stay: Payer: 59 | Attending: Oncology | Admitting: Physician Assistant

## 2022-11-04 VITALS — BP 127/56 | HR 106 | Temp 98.1°F | Resp 16 | Wt 138.7 lb

## 2022-11-04 DIAGNOSIS — Z85820 Personal history of malignant melanoma of skin: Secondary | ICD-10-CM

## 2022-11-04 DIAGNOSIS — J449 Chronic obstructive pulmonary disease, unspecified: Secondary | ICD-10-CM

## 2022-11-04 DIAGNOSIS — R918 Other nonspecific abnormal finding of lung field: Secondary | ICD-10-CM

## 2022-11-04 DIAGNOSIS — D649 Anemia, unspecified: Secondary | ICD-10-CM | POA: Diagnosis not present

## 2022-11-04 DIAGNOSIS — Z79899 Other long term (current) drug therapy: Secondary | ICD-10-CM | POA: Insufficient documentation

## 2022-11-04 DIAGNOSIS — C3411 Malignant neoplasm of upper lobe, right bronchus or lung: Secondary | ICD-10-CM | POA: Diagnosis not present

## 2022-11-04 DIAGNOSIS — Z7189 Other specified counseling: Secondary | ICD-10-CM

## 2022-11-04 DIAGNOSIS — Z803 Family history of malignant neoplasm of breast: Secondary | ICD-10-CM

## 2022-11-04 DIAGNOSIS — Z87891 Personal history of nicotine dependence: Secondary | ICD-10-CM | POA: Diagnosis not present

## 2022-11-04 DIAGNOSIS — Z800 Family history of malignant neoplasm of digestive organs: Secondary | ICD-10-CM

## 2022-11-04 DIAGNOSIS — I10 Essential (primary) hypertension: Secondary | ICD-10-CM

## 2022-11-04 DIAGNOSIS — I2510 Atherosclerotic heart disease of native coronary artery without angina pectoris: Secondary | ICD-10-CM

## 2022-11-04 DIAGNOSIS — G893 Neoplasm related pain (acute) (chronic): Secondary | ICD-10-CM | POA: Insufficient documentation

## 2022-11-04 DIAGNOSIS — R079 Chest pain, unspecified: Secondary | ICD-10-CM | POA: Diagnosis not present

## 2022-11-04 DIAGNOSIS — C7951 Secondary malignant neoplasm of bone: Secondary | ICD-10-CM

## 2022-11-04 DIAGNOSIS — C3490 Malignant neoplasm of unspecified part of unspecified bronchus or lung: Secondary | ICD-10-CM | POA: Insufficient documentation

## 2022-11-04 DIAGNOSIS — R634 Abnormal weight loss: Secondary | ICD-10-CM

## 2022-11-04 DIAGNOSIS — D508 Other iron deficiency anemias: Secondary | ICD-10-CM | POA: Diagnosis not present

## 2022-11-04 DIAGNOSIS — C3491 Malignant neoplasm of unspecified part of right bronchus or lung: Secondary | ICD-10-CM

## 2022-11-04 LAB — CBC WITH DIFFERENTIAL (CANCER CENTER ONLY)
Abs Immature Granulocytes: 0.13 10*3/uL — ABNORMAL HIGH (ref 0.00–0.07)
Basophils Absolute: 0.1 10*3/uL (ref 0.0–0.1)
Basophils Relative: 0 %
Eosinophils Absolute: 0.4 10*3/uL (ref 0.0–0.5)
Eosinophils Relative: 2 %
HCT: 33.6 % — ABNORMAL LOW (ref 36.0–46.0)
Hemoglobin: 10.4 g/dL — ABNORMAL LOW (ref 12.0–15.0)
Immature Granulocytes: 1 %
Lymphocytes Relative: 12 %
Lymphs Abs: 1.9 10*3/uL (ref 0.7–4.0)
MCH: 25.7 pg — ABNORMAL LOW (ref 26.0–34.0)
MCHC: 31 g/dL (ref 30.0–36.0)
MCV: 83.2 fL (ref 80.0–100.0)
Monocytes Absolute: 1.8 10*3/uL — ABNORMAL HIGH (ref 0.1–1.0)
Monocytes Relative: 12 %
Neutro Abs: 11.5 10*3/uL — ABNORMAL HIGH (ref 1.7–7.7)
Neutrophils Relative %: 73 %
Platelet Count: 705 10*3/uL — ABNORMAL HIGH (ref 150–400)
RBC: 4.04 MIL/uL (ref 3.87–5.11)
RDW: 15.9 % — ABNORMAL HIGH (ref 11.5–15.5)
WBC Count: 15.8 10*3/uL — ABNORMAL HIGH (ref 4.0–10.5)
nRBC: 0 % (ref 0.0–0.2)

## 2022-11-04 LAB — CMP (CANCER CENTER ONLY)
ALT: 6 U/L (ref 0–44)
AST: 13 U/L — ABNORMAL LOW (ref 15–41)
Albumin: 2.9 g/dL — ABNORMAL LOW (ref 3.5–5.0)
Alkaline Phosphatase: 61 U/L (ref 38–126)
Anion gap: 6 (ref 5–15)
BUN: 15 mg/dL (ref 8–23)
CO2: 29 mmol/L (ref 22–32)
Calcium: 11 mg/dL — ABNORMAL HIGH (ref 8.9–10.3)
Chloride: 100 mmol/L (ref 98–111)
Creatinine: 0.77 mg/dL (ref 0.44–1.00)
GFR, Estimated: >60 mL/min
Glucose, Bld: 115 mg/dL — ABNORMAL HIGH (ref 70–99)
Potassium: 4.2 mmol/L (ref 3.5–5.1)
Sodium: 135 mmol/L (ref 135–145)
Total Bilirubin: 0.4 mg/dL (ref 0.3–1.2)
Total Protein: 6.9 g/dL (ref 6.5–8.1)

## 2022-11-04 NOTE — Progress Notes (Signed)
12:30: Pt left a vm for navigator stating she will not be able to make the PET scan appt on 4/11 because she will not have anyone available to drive her. Navigator reached out to central scheduling to see when the next available PET would be at [facility]. Scheduler tells navigator that there isn't anything available until 4/25 still. Navigator called Nuc Med scheduler at [facility] and was able to arrange a PET for 1030 on 4/18. Navigator called the pt and let her know that she was able to get her a PET scan at [facility] for 4/18 at 1030. Pt also has a GI consult on 4/18 at 2:30, however per the scheduler at [facility], she will still be able to make that appt even with her PET scan at 1030. Pt states she will let her friend Delorise Shiner know and will call navigator back if she can't bring her.  Navigator also informed the pt that Dr. [name] who has been managing her iron deficiency anemia will be managing her Lung Cancer. Pt aware she has a follow up appt with Dr.Ribakove on 4/26 and navigator encouraged her to keep that appt unless he wants to see her sooner. If that is the case, navigator will coordinate.  While talking to the pt, her daughter, Pamelia Hoit, called. Navigator updated [name] on new PET appt day/time/location and that Dr.Ribakove will be managing pt's lung cancer, however navigator will continue follow as needed.  Pt and her daughter are now both aware of the plan. They were encouraged by the navigator to call with any concerns or questions.

## 2022-11-04 NOTE — Progress Notes (Signed)
Pt returned navigators call, however she wasn't able to understand the Navigators message. Navigator relayed the new appt day/time/location to the pt. Pt states she will call her friend, Delorise Shiner, to see if she can drive her to that appointment, and she will call the navigator back if she can't.

## 2022-11-04 NOTE — Patient Instructions (Addendum)
-  There are two main categories of lung cancer, they are named based on the size of the cancer cell. One is called Non-Small cell lung cancer. 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  -We are going to run some tests to see if you are a candidate for immunotherapy alone. If the marker is high (>50%), then we can give immunotherapy alone. If not, then treatment is immunotherapy and chemotherapy: See below:    -We covered a lot of important information at your appointment today regarding what the treatment plan is moving forward. Here are the the main points that were discussed at your office visit with [place] today:  -The treatment that you will receive consists of two chemotherapy drugs, called Carboplatin and Paclitaxel (also referred to as Taxol) and one immunotherapy drug called Keytruda (pembrolizumab).  -We will avoid scheduling any treatment at this time until we have the results of your immunotherapy marker. Once you have a treatment plan, I would like you to attend a Chemotherapy Education Class. This involves having you sit down with one of our nurse educators. She will discuss with you one-on-one more details about your treatment as well as general information about resources here at the cancer center.  -Your treatment will be given once every 3 weeks. We will check your labs once a week for the first ~5 treatments just to make sure that important components of your blood are in an acceptable range -Once you are starting your 5th treatment, then you will be on immunotherapy alone with [place].  -You will need to return 2 days after your treatment to receive an injection. This injection is important because it boosts your infection fighting cells in your body and  helps protect you from getting an infection.  -We will get a CT scan after 3 treatments to check on the progress of treatment  Medications:    Referrals or Imaging:  -Please keep the appointments for the PET scan and MRI as scheduled.   Follow up:  -We will decide what to do once we get the results of the immunotherapy.   If you need to contact our office, please do not hesitate, we are here to help. Our number is [phone]. When you call, ask to speak to Cassie's or Dr. Asa Lente nurse.

## 2022-11-04 NOTE — Progress Notes (Signed)
11:00 Navigator met patient and her daughter, Pamelia Hoit, today at her consult. Plan for pt is to have her tissue tested for PD-L1 and complete her PET scan. PET scan is currently scheduled for 4/25, but Navigator will reach out to scheduling and attempt to move the PET scan up to an earlier date. Once pt has completed the PET and the tissue results have returned, a follow up appt can be made to discuss treatment options.  Pt does have limited transportation, and services were offered, however pt and her daughter would rather the pt be taken by family members or friends to her appointments and can adjust their schedules to do so. Pt may have questions regarding insurance coverage for her treatments, but will wait until the plan for treatment (chemo/immunotherapy vs immunotherapy alone), however navigator will refer the pt to SW who may be able to provide guidance on the subject. The pt's daughter works at [employer], but will not require FMLA paperwork at this time. Pt has an excellent support system and there is no concern at the moment about access to food.

## 2022-11-04 NOTE — Progress Notes (Signed)
Courtney Brown called the Courtney Brown to let her know she was able to move her PET scan up to 4/11 @ 2:30pm at [facility]. Navigator ask that the Courtney Brown return her call to confirm that she has received the message.  Navigator also reached out to the Courtney Brown's daughter to inform her of the appt change, however Pamelia Hoit did not answer the phone. VM left.

## 2022-11-05 ENCOUNTER — Ambulatory Visit (HOSPITAL_COMMUNITY)
Admission: RE | Admit: 2022-11-05 | Discharge: 2022-11-05 | Disposition: A | Discharge status: Home / Self Care | Payer: 59 | Source: Ambulatory Visit | Attending: Emergency Medicine | Admitting: Emergency Medicine

## 2022-11-05 DIAGNOSIS — C3490 Malignant neoplasm of unspecified part of unspecified bronchus or lung: Secondary | ICD-10-CM

## 2022-11-05 DIAGNOSIS — G319 Degenerative disease of nervous system, unspecified: Secondary | ICD-10-CM | POA: Diagnosis not present

## 2022-11-05 MED ORDER — GADOBUTROL 1 MMOL/ML IV SOLN
6.0000 mL | Freq: Once | INTRAVENOUS | Status: AC | PRN
  Administered 2022-11-05: 6 mL via INTRAVENOUS

## 2022-11-06 ENCOUNTER — Other Ambulatory Visit: Payer: Self-pay | Admitting: Internal Medicine

## 2022-11-06 ENCOUNTER — Telehealth: Payer: Self-pay | Admitting: Internal Medicine

## 2022-11-06 ENCOUNTER — Other Ambulatory Visit: Payer: Self-pay | Admitting: *Deleted

## 2022-11-06 ENCOUNTER — Encounter (HOSPITAL_COMMUNITY): Admission: RE | Admit: 2022-11-06 | Payer: 59 | Source: Ambulatory Visit

## 2022-11-06 DIAGNOSIS — G893 Neoplasm related pain (acute) (chronic): Secondary | ICD-10-CM

## 2022-11-06 MED ORDER — HYDROCODONE-ACETAMINOPHEN 5-325 MG PO TABS
1.0000 | ORAL_TABLET | Freq: Four times a day (QID) | ORAL | 0 refills | Status: DC | PRN
Start: 2022-11-06 — End: 2022-11-21

## 2022-11-06 NOTE — Progress Notes (Signed)
The proposed treatment discussed in conference is for discussion purpose only and is not a binding recommendation.  The patients have not been physically examined, or presented with their treatment options.  Therefore, final treatment plans cannot be decided.  

## 2022-11-06 NOTE — Telephone Encounter (Signed)
Prescription Request  11/06/2022  LOV: 11/03/2022  What is the name of the medication or equipment? HYDROcodone-acetaminophen (NORCO/VICODIN) 5-325 MG tablet   Have you contacted your pharmacy to request a refill? No   Which pharmacy would you like this sent to?  [id number] [place], [place] - [address] [place] [address] Phone: [phone] Fax: [phone]    Patient notified that their request is being sent to the clinical staff for review and that they should receive a response within 2 business days.   Please advise at Mobile [phone] (mobile)

## 2022-11-10 ENCOUNTER — Telehealth: Payer: Self-pay | Admitting: Internal Medicine

## 2022-11-10 NOTE — Telephone Encounter (Signed)
Patient was prescribed potassium chloride SA (KLOR-CON M) 20 MEQ tablet at her ER visit earlier in the month. She wants to know if she needs to continue taking it once she runs out. She said she has a couple days left. She would like a callback at [phone].

## 2022-11-10 NOTE — Telephone Encounter (Signed)
LVM for pt to call back to set ED f/u appt and to also discuss potassium chloride medication

## 2022-11-11 ENCOUNTER — Ambulatory Visit: Payer: Self-pay

## 2022-11-11 NOTE — Patient Instructions (Addendum)
Visit Information  Thank you for taking time to visit with me today. Please don't hesitate to contact me if I can be of assistance to you.   Following are the goals we discussed today:   Goals Addressed             This Visit's Progress    Care Coordination Activities       Interventions Today    Flowsheet Row Most Recent Value  Chronic Disease   Chronic disease during today's visit Other  [mass upper right lobe]  General Interventions   General Interventions Discussed/Reviewed --  [reviewed care coordination program with patient.]  Doctor Visits Discussed/Reviewed Doctor Visits Discussed  PCP/Specialist Visits Compliance with follow-up visit  Education Interventions   Education Provided Provided Education, Provided Web-based Education  Provided Verbal Education On [employer], Nutrition  [healthy eating, discussed transportation benefit with patient]  Nutrition Interventions   Nutrition Discussed/Reviewed Nutrition Discussed  [discussed eating healthy balanced meals.]  Pharmacy Interventions   Pharmacy Dicussed/Reviewed Pharmacy Topics Discussed  [denies any questions or concerns regarding medication management]            Our next appointment is by telephone on 12/02/22 at 11:30 am  Please call the care guide team at [phone] if you need to cancel or reschedule your appointment.   If you are experiencing a Mental Health or Behavioral Health Crisis or need someone to talk to, please call the Suicide and Crisis Lifeline: 9  [name], RN, MSN, BSN, CCM [facility] Care Coordinator [phone]    Healthy Eating, Adult Healthy eating may help you get and keep a healthy body weight, reduce the risk of chronic disease, and live a long and productive life. It is important to follow a healthy eating pattern. Your nutritional and calorie needs should be met mainly by different nutrient-rich foods. What are tips for following this plan? Reading food labels Read labels  and choose the following: Reduced or low sodium products. Juices with 100% fruit juice. Foods with low saturated fats (<3 g per serving) and high polyunsaturated and monounsaturated fats. Foods with whole grains, such as whole wheat, cracked wheat, brown rice, and wild rice. Whole grains that are fortified with folic acid. This is recommended for females who are pregnant or who want to become pregnant. Read labels and do not eat or drink the following: Foods or drinks with added sugars. These include foods that contain brown sugar, corn sweetener, corn syrup, dextrose, fructose, glucose, high-fructose corn syrup, honey, invert sugar, lactose, malt syrup, maltose, molasses, raw sugar, sucrose, trehalose, or turbinado sugar. Limit your intake of added sugars to less than 10% of your total daily calories. Do not eat more than the following amounts of added sugar per day: 6 teaspoons (25 g) for females. 9 teaspoons (38 g) for males. Foods that contain processed or refined starches and grains. Refined grain products, such as white flour, degermed cornmeal, white bread, and white rice. Shopping Choose nutrient-rich snacks, such as vegetables, whole fruits, and nuts. Avoid high-calorie and high-sugar snacks, such as potato chips, fruit snacks, and candy. Use oil-based dressings and spreads on foods instead of solid fats such as butter, margarine, sour cream, or cream cheese. Limit pre-made sauces, mixes, and "instant" products such as flavored rice, instant noodles, and ready-made pasta. Try more plant-protein sources, such as tofu, tempeh, black beans, edamame, lentils, nuts, and seeds. Explore eating plans such as the Mediterranean diet or vegetarian diet. Try heart-healthy dips made with beans and healthy  fats like hummus and guacamole. Vegetables go great with these. Cooking Use oil to saut or stir-fry foods instead of solid fats such as butter, margarine, or lard. Try baking, boiling, grilling,  or broiling instead of frying. Remove the fatty part of meats before cooking. Steam vegetables in water or broth. Meal planning  At meals, imagine dividing your plate into fourths: One-half of your plate is fruits and vegetables. One-fourth of your plate is whole grains. One-fourth of your plate is protein, especially lean meats, poultry, eggs, tofu, beans, or nuts. Include low-fat dairy as part of your daily diet. Lifestyle Choose healthy options in all settings, including home, work, school, restaurants, or stores. Prepare your food safely: Wash your hands after handling raw meats. Where you prepare food, keep surfaces clean by regularly washing with hot, soapy water. Keep raw meats separate from ready-to-eat foods, such as fruits and vegetables. Cook seafood, meat, poultry, and eggs to the recommended temperature. Get a food thermometer. Store foods at safe temperatures. In general: Keep cold foods at 36F (4.4C) or below. Keep hot foods at 136F (60C) or above. Keep your freezer at [facility] (-17.8C) or below. Foods are not safe to eat if they have been between the temperatures of 40-136F (4.4-60C) for more than 2 hours. What foods should I eat? Fruits Aim to eat 1-2 cups of fresh, canned (in natural juice), or frozen fruits each day. One cup of fruit equals 1 small apple, 1 large banana, 8 large strawberries, 1 cup (237 g) canned fruit,  cup (82 g) dried fruit, or 1 cup (240 mL) 100% juice. Vegetables Aim to eat 2-4 cups of fresh and frozen vegetables each day, including different varieties and colors. One cup of vegetables equals 1 cup (91 g) broccoli or cauliflower florets, 2 medium carrots, 2 cups (150 g) raw, leafy greens, 1 large tomato, 1 large bell pepper, 1 large sweet potato, or 1 medium white potato. Grains Aim to eat 5-10 ounce-equivalents of whole grains each day. Examples of 1 ounce-equivalent of grains include 1 slice of bread, 1 cup (40 g) ready-to-eat cereal, 3 cups  (24 g) popcorn, or  cup (93 g) cooked rice. Meats and other proteins Try to eat 5-7 ounce-equivalents of protein each day. Examples of 1 ounce-equivalent of protein include 1 egg,  oz nuts (12 almonds, 24 pistachios, or 7 walnut halves), 1/4 cup (90 g) cooked beans, 6 tablespoons (90 g) hummus or 1 tablespoon (16 g) peanut butter. A cut of meat or fish that is the size of a deck of cards is about 3-4 ounce-equivalents (85 g). Of the protein you eat each week, try to have at least 8 sounce (227 g) of seafood. This is about 2 servings per week. This includes salmon, trout, herring, sardines, and anchovies. Dairy Aim to eat 3 cup-equivalents of fat-free or low-fat dairy each day. Examples of 1 cup-equivalent of dairy include 1 cup (240 mL) milk, 8 ounces (250 g) yogurt, 1 ounces (44 g) natural cheese, or 1 cup (240 mL) fortified soy milk. Fats and oils Aim for about 5 teaspoons (21 g) of fats and oils per day. Choose monounsaturated fats, such as canola and olive oils, mayonnaise made with olive oil or avocado oil, avocados, peanut butter, and most nuts, or polyunsaturated fats, such as sunflower, corn, and soybean oils, walnuts, pine nuts, sesame seeds, sunflower seeds, and flaxseed. Beverages Aim for 6 eight-ounce glasses of water per day. Limit coffee to 3-5 eight-ounce cups per day. Limit caffeinated beverages  that have added calories, such as soda and energy drinks. If you drink alcohol: Limit how much you have to: 0-1 drink a day if you are female. 0-2 drinks a day if you are female. Know how much alcohol is in your drink. In the [place] one drink is one 12 oz bottle of beer (355 mL), one 5 oz glass of wine (148 mL), or one 1 oz glass of hard liquor (44 mL). Seasoning and other foods Try not to add too much salt to your food. Try using herbs and spices instead of salt. Try not to add sugar to food. This information is based on [place] nutrition guidelines. To learn more, visit [employer].be.  Exact amounts may vary. You may need different amounts. This information is not intended to replace advice given to you by your health care provider. Make sure you discuss any questions you have with your health care provider. Document Revised: 04/14/2022 Document Reviewed: 04/14/2022 Elsevier Patient Education  2023 [employer].

## 2022-11-11 NOTE — Patient Outreach (Signed)
  Care Coordination   Initial Visit Note   11/11/2022 Name: Courtney Brown MRN: [id number] DOB: 21-Sep-1940  Courtney Brown is a 82 y.o. year old female who sees [name], MD for primary care. I spoke with  [name] by phone today.  What matters to the patients health and wellness today?  10/23/22-10/25/22 admission-RUL mass on cxr; Bronchoscopy on 10/27/22-new diagnosis of lung cancer. Ms. Crail reports she has been on contact with her oncology navigator Alejandro Mulling. She reports her oncologist that will be treating her is also her hematologist Dr. [name]. She states she lives with her daughter. Per Ms. Burkland, daughter works, but is very supportive. Ms. Schnoebelen reports she has a friend who she prefers to take her to her appointments, but is aware of transportation benefit with insurance provider. She denies any questions or concerns at this time.  Goals Addressed             This Visit's Progress    Care Coordination Activities       Interventions Today    Flowsheet Row Most Recent Value  Chronic Disease   Chronic disease during today's visit Other  [mass upper right lobe]  General Interventions   General Interventions Discussed/Reviewed --  [reviewed care coordination program with patient.]  Doctor Visits Discussed/Reviewed Doctor Visits Discussed  PCP/Specialist Visits Compliance with follow-up visit  Education Interventions   Education Provided Provided Education, Provided Web-based Education  Provided Verbal Education On Insurance Plans, Nutrition  [healthy eating, discussed transportation benefit with patient]  Nutrition Interventions   Nutrition Discussed/Reviewed Nutrition Discussed  [discussed eating healthy balanced meals.]  Pharmacy Interventions   Pharmacy Dicussed/Reviewed Pharmacy Topics Discussed  [denies any questions or concerns regarding medication management]            SDOH assessments and interventions completed:  Yes RNCM confirmed not changes.    SDOH Interventions Today    Flowsheet Row Most Recent Value  SDOH Interventions   Transportation Interventions Intervention Not Indicated     Care Coordination Interventions:  Yes, provided   Follow up plan: Follow up call scheduled for 12/02/22    Encounter Outcome:  Pt. Visit Completed   [name], RN, MSN, BSN, CCM [facility] Care Coordinator [phone]

## 2022-11-13 ENCOUNTER — Ambulatory Visit (INDEPENDENT_AMBULATORY_CARE_PROVIDER_SITE_OTHER): Payer: 59 | Admitting: Nurse Practitioner

## 2022-11-13 ENCOUNTER — Encounter (HOSPITAL_COMMUNITY): Payer: 59

## 2022-11-13 ENCOUNTER — Encounter: Payer: Self-pay | Admitting: Nurse Practitioner

## 2022-11-13 ENCOUNTER — Other Ambulatory Visit: Payer: Self-pay | Admitting: Internal Medicine

## 2022-11-13 VITALS — BP 120/60 | HR 81 | Ht 60.0 in | Wt 138.0 lb

## 2022-11-13 DIAGNOSIS — D509 Iron deficiency anemia, unspecified: Secondary | ICD-10-CM | POA: Diagnosis not present

## 2022-11-13 DIAGNOSIS — M810 Age-related osteoporosis without current pathological fracture: Secondary | ICD-10-CM

## 2022-11-13 DIAGNOSIS — Z86010 Personal history of colonic polyps: Secondary | ICD-10-CM | POA: Diagnosis not present

## 2022-11-13 DIAGNOSIS — K219 Gastro-esophageal reflux disease without esophagitis: Secondary | ICD-10-CM

## 2022-11-13 NOTE — Progress Notes (Signed)
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 S/P cholecystectomy 2016 and hysterectomy 1998.  She presents to our office today for further evaluation regarding iron deficiency anemia.Since she was referred to our office 07/2022, she was subsequently diagnosed with non-small cell lung cancer with. Brain MRI 11/05/2022 was negative for brain metastasis. She is scheduled for a PET scan 11/18/2022.   She is followed by hematologist, Dr. [name] for IDA and recently saw Cassandra Heilingoetter PA and Dr. [name] at [facility] regarding lung cancer evaluation and treatment. Dr. [name] may eventually manage her lung cancer.   She endorsed being diagnosed with iron deficiency anemia 10/2021. She is taking oral iron daily and previously took vitamin B12 injections. She feels a little queasy at times relieved by taking Zofran. No vomiting. She has a history of GERD for which she takes Prilosec 20mg  QD for 20 - 25 years daily. No heartburn or dysphagia. No abdominal pain. Her bowel pattern varies. She is having some constipation since taking pain medications for lung cancer associated right sided chest pain. She sometimes has loose stools. She urinates and often passes a BM at the same time. She can go a few days without passing a BM. No rectal bleeding or black stools.   She underwent a colonoscopy 10 to 12 year ago when living in [place] and she reported a few polyps were removed. Prior  colonoscopies x 2 or 3 were normal. Brother was diagnosed with colon cancer over the age of 46.   She had ane EGD at the time of each colonoscopy which showed a hiaital hernia.       Latest Ref Rng & Units 11/04/2022    9:15 AM 10/25/2022    4:06 AM 10/24/2022    3:24 AM  CBC  WBC 4.0 - 10.5 K/uL 15.8  15.4  15.7   Hemoglobin 12.0 - 15.0 g/dL 40.9  9.1  9.3   Hematocrit 36.0 - 46.0 % 33.6  29.7  31.8   Platelets 150 - 400 K/uL 705  632  638        Latest Ref Rng & Units 11/04/2022    9:15 AM 10/25/2022    4:06 AM 10/24/2022    3:24 AM  CMP  Glucose 70 - 99 mg/dL 811  95  98   BUN 8 - 23 mg/dL 15  11  15    Creatinine 0.44 - 1.00 mg/dL 9.14  7.82  9.56   Sodium 135 - 145 mmol/L 135  135  134   Potassium 3.5 - 5.1 mmol/L 4.2  3.9  3.3   Chloride 98 - 111 mmol/L 100  103  102   CO2 22 - 32 mmol/L 29  24  25    Calcium 8.9 - 10.3 mg/dL 21.3  9.4  9.3   Total Protein 6.5 - 8.1 g/dL 6.9     Total Bilirubin 0.3 - 1.2 mg/dL 0.4     Alkaline Phos 38 - 126 U/L 61     AST 15 - 41 U/L 13     ALT 0 -  44 U/L 6       Labs 08/15/2022: Iron 17. Saturation ratios 6%. TIBC 286. Ferritin 391.  Past Medical History:  Diagnosis Date   Allergy    Anxiety    Arthritis    Cancer    hx of melanoma    Colon polyps    COPD (chronic obstructive pulmonary disease)    Coronary artery disease    Dyspnea    with exertion    Edema    in lower extremties    Emphysema of lung    GERD (gastroesophageal reflux disease)    H/O hiatal hernia    Hyperlipidemia    Hypertension    Inverted nipple    bilateral, pt says that's normal   Lung cancer 10/23/2022   Malignant melanoma 1983   again in 1995]   Myocardial infarct, old 2008   no stent placed   Osteoporosis    Pneumonia    hx of    Sepsis    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    Stroke    TIA   Past Surgical History:  Procedure Laterality Date   ABDOMINAL HYSTERECTOMY  1998   BRONCHIAL  BIOPSY  10/27/2022   Procedure: BRONCHIAL BIOPSIES;  Surgeon: Leslye Peer, MD;  Location: [facility] ENDOSCOPY;  Service: Cardiopulmonary;;   BRONCHIAL BRUSHINGS  10/27/2022   Procedure: BRONCHIAL BRUSHINGS;  Surgeon: Leslye Peer, MD;  Location: [facility];  Service: Cardiopulmonary;;   CHOLECYSTECTOMY  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   HEMOSTASIS CONTROL  10/27/2022   Procedure: HEMOSTASIS CONTROL;  Surgeon: Leslye Peer, MD;  Location: [facility] ENDOSCOPY;  Service: Cardiopulmonary;;   HERNIA REPAIR     umbilical, ventral   INCISIONAL HERNIA REPAIR N/A 10/26/2020   Procedure: DIAGNOSTIC LAPAROSCOPY MESH EXPLANTATION; SMALL BOWEL RESECTION; PRIMARY REPAIR OF INCISIONAL HERNIA;  Surgeon: Sheliah Hatch De Blanch, MD;  Location: [facility] ORS;  Service: General;  Laterality: N/A;   INSERTION OF ILIAC STENT  2008   VIDEO BRONCHOSCOPY Right 10/27/2022   Procedure: VIDEO BRONCHOSCOPY WITHOUT FLUORO;  Surgeon: Leslye Peer, MD;  Location: [facility] ENDOSCOPY;  Service: Cardiopulmonary;  Laterality: Right;   Social History:  She is widowed. Retired. She smoked cigarettes 1 1/2 ppd x 40 years, quit in 2007. She has never used smokeless tobacco. She reports current alcohol use of about 2.0 standard drinks of alcohol per week. She reports that she does not use drugs.  Family History: family history includes Arthritis in her mother; Cancer in her maternal grandmother and paternal grandmother; Colon cancer in her brother; Hearing loss in her father, maternal aunt, and maternal grandmother. Mother had a bleeding ulcer.   Allergies  Allergen Reactions   Prednisone Other (See Comments)    Leg pain,light headed   Statins     Myalgias- per patient, she feels better off the medication     Outpatient Encounter Medications as of 11/13/2022  Medication Sig   albuterol (VENTOLIN HFA) 108 (90 Base) MCG/ACT inhaler INHALE 1-2 PUFFS BY MOUTH EVERY 6 HOURS AS NEEDED FOR WHEEZE OR SHORTNESS OF BREATH   ALPRAZolam (XANAX) 0.25 MG tablet  Take 1 tablet (0.25 mg total) by mouth at bedtime.   budesonide-formoterol (SYMBICORT) 160-4.5 MCG/ACT inhaler TAKE 2 PUFFS BY MOUTH TWICE A DAY   Calcium Citrate-Vitamin D (CITRACAL + D PO) Take 1 tablet by mouth in the morning. 600 mg calcium   colesevelam (WELCHOL) 625 MG tablet TAKE 1 TABLET (625  MG TOTAL) BY MOUTH 2 (TWO) TIMES DAILY WITH A MEAL.   fenofibrate (TRICOR) 145 MG tablet TAKE 1 TABLET BY MOUTH EVERY DAY   Ferric Maltol (ACCRUFER) 30 MG CAPS Take 1 capsule by mouth in the morning and at bedtime.   folic acid (FOLVITE) 1 MG tablet TAKE 1 TABLET BY MOUTH EVERY DAY   furosemide (LASIX) 20 MG tablet Take 20 mg by mouth daily. Patient takes as needed   gabapentin (NEURONTIN) 100 MG capsule Take 1-3 capsules (100-300 mg total) by mouth 2 (two) times daily. Take 1 capsule (100 mg) in the morning and Take 3 capsules (300 mg) at bedtime   Garlic Oil 1000 MG CAPS Take 1,000 mg by mouth daily.   HYDROcodone-acetaminophen (NORCO/VICODIN) 5-325 MG tablet Take 1 tablet by mouth every 6 (six) hours as needed for moderate pain.   Lactobacillus (PROBIOTIC ACIDOPHILUS) CAPS Take 1 tablet by mouth daily.   loratadine (CLARITIN) 10 MG tablet TAKE 1 TABLET BY MOUTH EVERY DAY   magnesium oxide (MAG-OX) 400 (240 Mg) MG tablet TAKE 1 TABLET BY MOUTH TWICE A DAY   metoprolol succinate (TOPROL-XL) 50 MG 24 hr tablet Take 1 tablet (50 mg total) by mouth daily.   mometasone (NASONEX) 50 MCG/ACT nasal spray PLACE 2 SPRAYS INTO THE NOSE DAILY.   omeprazole (PRILOSEC) 20 MG capsule TAKE 1 CAPSULE BY MOUTH EVERY DAY   ondansetron (ZOFRAN) 4 MG tablet Take 1 tablet (4 mg total) by mouth every 8 (eight) hours as needed for nausea or vomiting.   primidone (MYSOLINE) 50 MG tablet Take 1 tablet (50 mg total) by mouth at bedtime.   raloxifene (EVISTA) 60 MG tablet TAKE 1 TABLET BY MOUTH EVERY DAY   triamcinolone cream (KENALOG) 0.1 % Apply 1 Application topically daily as needed (yeast infection under breast).   Zinc  50 MG TABS TAKE 1 TABLET BY MOUTH EVERY DAY   [DISCONTINUED] aspirin EC 81 MG tablet Take 81 mg by mouth daily. Swallow whole. (Patient not taking: Reported on 11/04/2022)   [DISCONTINUED] cyanocobalamin (VITAMIN B12) 1000 MCG tablet Take 1,000 mcg by mouth daily. (Patient not taking: Reported on 11/04/2022)   [DISCONTINUED] potassium chloride SA (KLOR-CON M) 20 MEQ tablet Take 2 tablets (40 mEq total) by mouth daily.   No facility-administered encounter medications on file as of 11/13/2022.    REVIEW OF SYSTEMS:  Gen: Denies fever, sweats or chills. No weight loss.  CV: Denies chest pain, palpitations or edema. Resp:See HPI. No hemoptysis.   GI: Denies heartburn, dysphagia, stomach or lower abdominal pain. No diarrhea or constipation.  GU : Denies urinary burning, blood in urine, increased urinary frequency or incontinence. MS: Denies joint pain, muscles aches or weakness. Derm: Denies rash, itchiness, skin lesions or unhealing ulcers. Psych: Denies depression, anxiety, memory loss or confusion. Heme: Denies bruising, easy bleeding. Neuro:  Denies headaches, dizziness or paresthesias. Endo:  Denies any problems with DM, thyroid or adrenal function.  PHYSICAL EXAM: BP 120/60   Pulse 81   Ht 5' (1.524 m)   Wt 138 lb (62.6 kg)   BMI 26.95 kg/m   General: 82 year old female in no acute distress. Head: Normocephalic and atraumatic. Eyes:  Sclerae non-icteric, conjunctive pink. Ears: Normal auditory acuity. Mouth: Dentition intact. No ulcers or lesions.  Neck: Supple, no lymphadenopathy or thyromegaly.  Lungs: Clear bilaterally to auscultation without wheezes, crackles or rhonchi. Heart: Regular rate and rhythm. No murmur, rub or gallop appreciated.  Abdomen: Soft, nontender, nondistended. Central midline abdominal  scar with supraumbilical seroma vs hernia. No masses. No hepatosplenomegaly. Normoactive bowel sounds x 4 quadrants.  Rectal: Deferred.  Musculoskeletal: Symmetrical with no  gross deformities. Skin: Warm and dry. No rash or lesions on visible extremities. Extremities: No edema. Neurological: Alert oriented x 4, no focal deficits.  Psychological:  Alert and cooperative. Normal mood and affect.  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 -Patient to proceed with PET scan and oncology follow  up as planned  -Oncology considering immunotherapy vs immunotherapy/chemotherapy   Iron deficiency anemia, likely due to non-small cell squamous cell lung cancer. No overt GI bleeding. Hg 10.4.  -Await PET scan prior to considering any endoscopic evaluation, to discuss further with Dr. Chales Abrahams -Oral iron with CBC/iron levels management per oncology   History of colon polyps per colonoscopy done in [place] 10 to 12 years ago. Brother diagnosed with colon cancer at age > 110. -Await PET scan results, rule out metastatic disease in setting of newly diagnosed lung cancer prior to pursuing a future colonoscopy   History of GERD. Patient reported a history of a hiatal hernia.  -Continue Omeprazole 20mg  po QD  CC:  [name], MD

## 2022-11-13 NOTE — Patient Instructions (Addendum)
Further Gi recommendations regarding endoscopy & colonoscopy to be determined after PET scan is reviewed.  Thank you for trusting me with your gastrointestinal care!   Alcide Evener, CRNP

## 2022-11-14 ENCOUNTER — Ambulatory Visit: Payer: 59 | Admitting: Oncology

## 2022-11-14 ENCOUNTER — Other Ambulatory Visit: Payer: 59

## 2022-11-18 ENCOUNTER — Ambulatory Visit
Admission: RE | Admit: 2022-11-18 | Discharge: 2022-11-18 | Disposition: A | Discharge status: Home / Self Care | Payer: 59 | Source: Ambulatory Visit | Attending: Emergency Medicine | Admitting: Emergency Medicine

## 2022-11-18 DIAGNOSIS — C3490 Malignant neoplasm of unspecified part of unspecified bronchus or lung: Secondary | ICD-10-CM | POA: Insufficient documentation

## 2022-11-18 DIAGNOSIS — I700 Atherosclerosis of aorta: Secondary | ICD-10-CM | POA: Diagnosis not present

## 2022-11-18 DIAGNOSIS — J90 Pleural effusion, not elsewhere classified: Secondary | ICD-10-CM | POA: Diagnosis not present

## 2022-11-18 LAB — GLUCOSE, CAPILLARY: Glucose-Capillary: 97 mg/dL (ref 70–99)

## 2022-11-18 MED ORDER — FLUDEOXYGLUCOSE F - 18 (FDG) INJECTION
7.7900 | Freq: Once | INTRAVENOUS | Status: AC | PRN
  Administered 2022-11-18: 7.79 via INTRAVENOUS

## 2022-11-19 NOTE — Progress Notes (Signed)
Agree with assessment/plan. Agreed lets wait for PET scan-looks like metastatic lung cancer  Edman Circle, MD [facility] [phone]

## 2022-11-19 NOTE — Progress Notes (Signed)
Agree with assessment/plan.  Raj Estelita Iten, MD [facility] [phone]  

## 2022-11-20 ENCOUNTER — Other Ambulatory Visit (HOSPITAL_COMMUNITY): Payer: 59

## 2022-11-20 ENCOUNTER — Other Ambulatory Visit: Payer: Self-pay | Admitting: Oncology

## 2022-11-20 DIAGNOSIS — C3491 Malignant neoplasm of unspecified part of right bronchus or lung: Secondary | ICD-10-CM

## 2022-11-21 ENCOUNTER — Other Ambulatory Visit: Payer: Self-pay | Admitting: Oncology

## 2022-11-21 ENCOUNTER — Inpatient Hospital Stay (HOSPITAL_BASED_OUTPATIENT_CLINIC_OR_DEPARTMENT_OTHER): Payer: 59 | Admitting: Oncology

## 2022-11-21 ENCOUNTER — Other Ambulatory Visit: Payer: Self-pay

## 2022-11-21 ENCOUNTER — Inpatient Hospital Stay: Payer: 59

## 2022-11-21 VITALS — BP 118/59 | HR 99 | Temp 97.8°F | Resp 20 | Ht 60.0 in | Wt 140.6 lb

## 2022-11-21 DIAGNOSIS — G893 Neoplasm related pain (acute) (chronic): Secondary | ICD-10-CM | POA: Diagnosis not present

## 2022-11-21 DIAGNOSIS — C3491 Malignant neoplasm of unspecified part of right bronchus or lung: Secondary | ICD-10-CM

## 2022-11-21 DIAGNOSIS — D508 Other iron deficiency anemias: Secondary | ICD-10-CM | POA: Diagnosis not present

## 2022-11-21 DIAGNOSIS — D649 Anemia, unspecified: Secondary | ICD-10-CM | POA: Diagnosis not present

## 2022-11-21 DIAGNOSIS — J9612 Chronic respiratory failure with hypercapnia: Secondary | ICD-10-CM | POA: Diagnosis not present

## 2022-11-21 DIAGNOSIS — Z79899 Other long term (current) drug therapy: Secondary | ICD-10-CM | POA: Diagnosis not present

## 2022-11-21 DIAGNOSIS — C7951 Secondary malignant neoplasm of bone: Secondary | ICD-10-CM | POA: Diagnosis not present

## 2022-11-21 DIAGNOSIS — C3411 Malignant neoplasm of upper lobe, right bronchus or lung: Secondary | ICD-10-CM | POA: Diagnosis not present

## 2022-11-21 LAB — CBC WITH DIFFERENTIAL/PLATELET
Abs Immature Granulocytes: 0.1 10*3/uL — ABNORMAL HIGH (ref 0.00–0.07)
Basophils Absolute: 0 10*3/uL (ref 0.0–0.1)
Basophils Relative: 0 %
Eosinophils Absolute: 0.2 10*3/uL (ref 0.0–0.5)
Eosinophils Relative: 1 %
HCT: 33.6 % — ABNORMAL LOW (ref 36.0–46.0)
Hemoglobin: 10.1 g/dL — ABNORMAL LOW (ref 12.0–15.0)
Immature Granulocytes: 1 %
Lymphocytes Relative: 7 %
Lymphs Abs: 1.3 10*3/uL (ref 0.7–4.0)
MCH: 24.8 pg — ABNORMAL LOW (ref 26.0–34.0)
MCHC: 30.1 g/dL (ref 30.0–36.0)
MCV: 82.6 fL (ref 80.0–100.0)
Monocytes Absolute: 1.7 10*3/uL — ABNORMAL HIGH (ref 0.1–1.0)
Monocytes Relative: 9 %
Neutro Abs: 14.5 10*3/uL — ABNORMAL HIGH (ref 1.7–7.7)
Neutrophils Relative %: 82 %
Platelets: 767 10*3/uL — ABNORMAL HIGH (ref 150–400)
RBC: 4.07 MIL/uL (ref 3.87–5.11)
RDW: 16.4 % — ABNORMAL HIGH (ref 11.5–15.5)
WBC: 17.8 10*3/uL — ABNORMAL HIGH (ref 4.0–10.5)
nRBC: 0 % (ref 0.0–0.2)

## 2022-11-21 LAB — FERRITIN: Ferritin: 336 ng/mL — ABNORMAL HIGH (ref 11–307)

## 2022-11-21 MED ORDER — HYDROCODONE-ACETAMINOPHEN 7.5-300 MG PO TABS: 1.0000 | ORAL_TABLET | Freq: Four times a day (QID) | ORAL | 0 refills | Status: DC | PRN

## 2022-11-21 NOTE — Progress Notes (Signed)
Courtney Brown Cancer Follow up Visit:  Patient Care Team: Courtney Grandchild, MD as PCP - General (Internal Medicine) Courtney Gess, MD as Consulting Physician (Cardiology) Courtney Muse, MD as Attending Physician (Hematology) Courtney Maryland, RN as [facility] Care Management  CHIEF COMPLAINTS/PURPOSE OF CONSULTATION:  Oncology History   No history exists.    HISTORY OF PRESENTING ILLNESS: Courtney Brown 82 y.o. female is here because of anemia Medical history notable for arthritis, history of melanoma, colon polyps, COPD, coronary artery disease, GERD, osteoporosis, hypertension, CVA, acute kidney injury Colonoscopy done 05/26/2014 by Courtney Brown in [place], [place]: colon polyps removed. Recommended to repeat in 5years. EGD done 05/26/2014 by Courtney Brown: mild gastritis.   February 26, 2022 ferritin 5.4 B12 163 zinc 51  June 04, 2022 WBC 14.7 hemoglobin 10.9 MCV 76 platelet count 637; 76 segs 10 lymphs 11 monos 2 eos 1 basophil. Ferritin 7.1  July 04 2022:  Courtney Brown In August 2023 patient was placed on oral iron, zinc, B12 injections.  She stopped taking oral iron for a time because it was costing too much.  She is now taking low dose oral iron with B12 and folic acid as well as zinc.  She takes the supplements bid on an empty stomach.  Has some diarrhea which may be related to her medications.  Received PRBC's in 2015 in the setting of sepsis.  No history of IV iron.  No hematochezia, melena, BRBPR.  Stools dark from oral iron.  Has not had endoscopy since 2015.  Takes ibuprofen for pain and is on ASA 81 mg daily  Social:  Widowed.  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  Quit smoking 16 yrs ago.  EtOH very seldom.    Courtney Brown Mother died 100 fall Father died 69's dementia, parkinsons Brother x 3.  All have had Parkinson's disease.  One alive.  Two died;  one had prostate cancer      WBC 15.1  hemoglobin 10.7 MCV 79 platelet count 650; 79 segs 8 lymphs 10 monos eos cell count 1.7%  Morphology notable for polychromasia Coombs negative haptoglobin 319 SPEP with IEP showed no paraprotein free kappa 70.7 lambda 52.3 with a kappa lambda 1.35 IgG 1089 IgA 370 IgM 90 Ferritin 115 Copper 111 B12 813 zinc 94 CMP notable for albumin 2.9 AST 13  July 11, 2022: Feraheme 510 mg IV  August 04, 2022: Patient message from Courtney Brown to schedule an appointment  August 15, 2022.  Scheduled follow-up for management of anemia.  She is taking Courtney Brown gentle iron 28 mg daily which she is tolerating well. Patient will schedule Brown appointment once daughter recovers from her dental surgery.     WBC 17.3 hemoglobin 11.2 MCV 81 platelet count 604; 79 segs 9 lymphs 10 monos 1 EO Ferritin 391 CMP notable for albumin 2.9  Calcium 10.5  Patient can take B12 by mouth  October 23, 2022: Presented to Courtney Brown with several month history of right upper chest and shoulder discomfort Chest x-ray showed large rounded mass in the right upper lobe.  CTPA demonstrated small right paratracheal and subcarinal lymph nodes.  No left hilar adenopathy.  Diffuse emphysematous changes.  Small right pleural effusion.  Large geographic area of soft tissue density in right upper lobe with mass effect.  Majority of right upper lobe bronchial tissues are occluded related to a central hilar mass.  Bony metastatic disease within the right fourth  rib.  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  Moderate generalized atrophy and white matter disease likely  from chronic microvascular ischemia.  Remote lacunar infarcts  November 18, 2022: PET/CT-centrally necrotic mass within right upper lobe measuring 10.7 x 7.9 cm (SUV 29.4).  Chest wall invasion with tumor extending into right fourth and fifth rib interspace.  Tumor extends into right hilum with encasement of right upper lobe and right middle lobe bronchus.  Several tracer avid nodules in left lung largest is in anterior left upper lobe measuring 1.2 cm (SUV 6.9) tracer avid right paratracheal lymph node.  Right adrenal nodule stable from prior imaging in July 2022.  Increased radiotracer uptake localized to distal right clavicle (SUV 3.18) with corresponding sclerotic changes on CT.  November 21 2022:  Scheduled follow up for newly diagnosed lung cancer.  Discussed results of labs and  imaging with patient and daughter.  Here with daughter Courtney Brown, with whom she lives.   Taking Hydrocodone/APAP 5/325 mg q 6 hrs PRN for the chest wall pain but finds that it barely lasts through that time.  Discussed use of MSIR but would prefer to avoid it at this time Together we viewed CT images Will refer to Radiation Oncology for consideration of palliative XRT to RUL Guardant 360 ordered Refer for port placement  Review of Systems  Constitutional:  Negative for appetite change, chills, fatigue, fever and unexpected weight change.  HENT:   Positive for hearing loss. Negative for mouth sores, nosebleeds, sore throat, trouble swallowing and voice change.   Eyes:  Positive for eye problems. Negative for icterus.       Has cataracts which interfere with vision as a result uses a walker  Respiratory:  Positive for shortness of breath. Negative for chest tightness, cough, hemoptysis and wheezing.        PND:  none Orthopnea:  none DOE:    Cardiovascular:  Negative for chest pain, leg swelling and palpitations.       PND:  none Orthopnea:  none  Gastrointestinal:  Negative for abdominal pain, blood in stool,  constipation, diarrhea, nausea and vomiting.  Endocrine: Negative for hot flashes.       Cold intolerance:  none Heat intolerance:  none  Genitourinary:  Negative for bladder incontinence, difficulty urinating, dysuria, frequency, hematuria and nocturia.   Musculoskeletal:  Positive for arthralgias, back pain and gait problem. Negative for myalgias, neck pain and neck stiffness.       Has chronic back pain  Skin:  Negative for itching, rash and wound.  Neurological:  Positive for extremity weakness and gait problem. Negative for dizziness, headaches, light-headedness, numbness, seizures and speech difficulty.  Hematological:  Negative for adenopathy. Bruises/bleeds easily.  Psychiatric/Behavioral:  Negative for sleep disturbance and suicidal ideas. The patient is not nervous/anxious.     MEDICAL HISTORY: Past Medical History:  Diagnosis Date   Allergy    Anxiety    Arthritis    Cancer (HCC)    hx of melanoma    Colon polyps    COPD (chronic obstructive pulmonary disease) (HCC)    Coronary artery disease    Dyspnea    with exertion    Edema  in lower extremties    Emphysema of lung (HCC)    GERD (gastroesophageal reflux disease)    H/O hiatal hernia    Hyperlipidemia    Hypertension    Inverted nipple    bilateral, pt says that's normal   Lung cancer (HCC) 10/23/2022   Malignant melanoma (HCC) 1983   again in 1995]   Myocardial infarct, old 2008   no stent placed   Osteoporosis    Pneumonia    hx of    Sepsis (HCC)    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    [facility])    TIA    SURGICAL HISTORY: Past Surgical History:  Procedure Laterality Date   ABDOMINAL HYSTERECTOMY  1998   BRONCHIAL BIOPSY  10/27/2022   Procedure: BRONCHIAL BIOPSIES;  Surgeon: Leslye Peer, MD;  Location: [facility] ENDOSCOPY;  Service: Cardiopulmonary;;   BRONCHIAL BRUSHINGS  10/27/2022   Procedure: BRONCHIAL BRUSHINGS;  Surgeon: Leslye Peer,  MD;  Location: [facility] ENDOSCOPY;  Service: Cardiopulmonary;;   CHOLECYSTECTOMY  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   HEMOSTASIS CONTROL  10/27/2022   Procedure: HEMOSTASIS CONTROL;  Surgeon: Leslye Peer, MD;  Location: [facility] ENDOSCOPY;  Service: Cardiopulmonary;;   HERNIA REPAIR     umbilical, ventral   INCISIONAL HERNIA REPAIR N/A 10/26/2020   Procedure: DIAGNOSTIC LAPAROSCOPY MESH EXPLANTATION; SMALL BOWEL RESECTION; PRIMARY REPAIR OF INCISIONAL HERNIA;  Surgeon: Sheliah Hatch De Blanch, MD;  Location: [facility] ORS;  Service: General;  Laterality: N/A;   INSERTION OF ILIAC STENT  2008   VIDEO BRONCHOSCOPY Right 10/27/2022   Procedure: VIDEO BRONCHOSCOPY WITHOUT FLUORO;  Surgeon: Leslye Peer, MD;  Location: [facility] ENDOSCOPY;  Service: Cardiopulmonary;  Laterality: Right;    SOCIAL HISTORY: Social History   Socioeconomic History   Marital status: Widowed    Spouse name: Not on file   Number of children: 3   Years of education: Not on file   Highest education level: 12th grade  Occupational History   Not on file  Tobacco Use   Smoking status: Former    Packs/day: 1.50    Years: 44.00    Additional pack years: 0.00    Total pack years: 66.00    Types: Cigarettes    Quit date: 07/28/2005    Years since quitting: 17.3   Smokeless tobacco: Never  Vaping Use   Vaping Use: Never used  Substance and Sexual Activity   Alcohol use: Yes    Alcohol/week: 2.0 standard drinks of alcohol    Types: 2 Shots of liquor per week    Comment: rarely   Drug use: No   Sexual activity: Not Currently  Other Topics Concern   Not on file  Social History Narrative   Right handed   Caffeine 64 0z a day   Home is one story    Lives with daughter   Retired.   Social Determinants of Health   Financial Resource Strain: Low Risk  (11/01/2022)   Overall Financial Resource Strain (CARDIA)    Difficulty of Paying Living Expenses: Not hard at all  Food Insecurity: No Food Insecurity (11/01/2022)   Hunger Vital Sign     Worried About Running Out of Food in the Last Year: Never true    Ran Out of Food in the Last Year: Never true  Transportation Needs: No Transportation Needs (11/11/2022)   PRAPARE - Administrator, Civil Service (Medical): No  Lack of Transportation (Non-Medical): No  Physical Activity: Sufficiently Active (11/01/2022)   Exercise Vital Sign    Days of Exercise per Week: 5 days    Minutes of Exercise per Session: 30 min  Stress: No Stress Concern Present (11/01/2022)   [employer] of Occupational Health - Occupational Stress Questionnaire    Feeling of Stress : Not at all  Social Connections: Unknown (11/01/2022)   Social Connection and Isolation Panel [NHANES]    Frequency of Communication with Friends and Family: More than three times a week    Frequency of Social Gatherings with Friends and Family: Patient declined    Attends Religious Services: Patient declined    Database administrator or Organizations: No    Attends Engineer, structural: Not on file    Marital Status: Widowed  Intimate Partner Violence: Not At Risk (10/24/2022)   Humiliation, Afraid, Rape, and Kick questionnaire    Fear of Current or Ex-Partner: No    Emotionally Abused: No    Physically Abused: No    Sexually Abused: No    FAMILY HISTORY Family History  Problem Relation Age of Onset   Arthritis Mother    Hearing loss Father    Colon cancer Brother    Hearing loss Maternal Grandmother    Cancer Maternal Grandmother        colon   Cancer Paternal Grandmother        colon   Hearing loss Maternal Aunt    Breast cancer Neg Hx     ALLERGIES:  is allergic to prednisone and statins.  MEDICATIONS:  Current Outpatient Medications  Medication Sig Dispense Refill   albuterol (VENTOLIN HFA) 108 (90 Base) MCG/ACT inhaler INHALE 1-2 PUFFS BY MOUTH EVERY 6 HOURS AS NEEDED FOR WHEEZE OR SHORTNESS OF BREATH 6.7 each 3   ALPRAZolam (XANAX) 0.25 MG tablet Take 1 tablet (0.25 mg total) by mouth  at bedtime.     budesonide-formoterol (SYMBICORT) 160-4.5 MCG/ACT inhaler TAKE 2 PUFFS BY MOUTH TWICE A DAY 30.6 each 1   Calcium Citrate-Vitamin D (CITRACAL + D PO) Take 1 tablet by mouth in the morning. 600 mg calcium     colesevelam (WELCHOL) 625 MG tablet TAKE 1 TABLET (625 MG TOTAL) BY MOUTH 2 (TWO) TIMES DAILY WITH A MEAL. 180 tablet 1   fenofibrate (TRICOR) 145 MG tablet TAKE 1 TABLET BY MOUTH EVERY DAY 90 tablet 1   Ferric Maltol (ACCRUFER) 30 MG CAPS Take 1 capsule by mouth in the morning and at bedtime. 180 capsule 0   folic acid (FOLVITE) 1 MG tablet TAKE 1 TABLET BY MOUTH EVERY DAY 90 tablet 1   furosemide (LASIX) 20 MG tablet Take 20 mg by mouth daily. Patient takes as needed     gabapentin (NEURONTIN) 100 MG capsule Take 1-3 capsules (100-300 mg total) by mouth 2 (two) times daily. Take 1 capsule (100 mg) in the morning and Take 3 capsules (300 mg) at bedtime     Garlic Oil 1000 MG CAPS Take 1,000 mg by mouth daily.     HYDROcodone-acetaminophen (NORCO/VICODIN) 5-325 MG tablet Take 1 tablet by mouth every 6 (six) hours as needed for moderate pain. 100 tablet 0   Lactobacillus (PROBIOTIC ACIDOPHILUS) CAPS Take 1 tablet by mouth daily.     loratadine (CLARITIN) 10 MG tablet TAKE 1 TABLET BY MOUTH EVERY DAY 90 tablet 1   magnesium oxide (MAG-OX) 400 (240 Mg) MG tablet TAKE 1 TABLET BY MOUTH TWICE A DAY 60 tablet  2   metoprolol succinate (TOPROL-XL) 50 MG 24 hr tablet Take 1 tablet (50 mg total) by mouth daily. 90 tablet 0   mometasone (NASONEX) 50 MCG/ACT nasal spray PLACE 2 SPRAYS INTO THE NOSE DAILY. 51 each 3   omeprazole (PRILOSEC) 20 MG capsule TAKE 1 CAPSULE BY MOUTH EVERY DAY 90 capsule 1   ondansetron (ZOFRAN) 4 MG tablet Take 1 tablet (4 mg total) by mouth every 8 (eight) hours as needed for nausea or vomiting. 20 tablet 0   primidone (MYSOLINE) 50 MG tablet Take 1 tablet (50 mg total) by mouth at bedtime. 90 tablet 3   raloxifene (EVISTA) 60 MG tablet TAKE 1 TABLET BY MOUTH  EVERY DAY 90 tablet 1   triamcinolone cream (KENALOG) 0.1 % Apply 1 Application topically daily as needed (yeast infection under breast).     Zinc 50 MG TABS TAKE 1 TABLET BY MOUTH EVERY DAY 90 tablet 1   No current facility-administered medications for this visit.    PHYSICAL EXAMINATION:  ECOG PERFORMANCE STATUS: 1 - Symptomatic but completely ambulatory   There were no vitals filed for this visit.   There were no vitals filed for this visit.    Physical Exam Vitals and nursing note reviewed.  Constitutional:      Appearance: Normal appearance. She is not toxic-appearing or diaphoretic.     Comments: Chronically ill appearing.  Has a walker  HENT:     Head: Normocephalic and atraumatic.     Right Ear: External ear normal.     Left Ear: External ear normal.     Nose: Nose normal. No congestion or rhinorrhea.  Eyes:     General: No scleral icterus.    Extraocular Movements: Extraocular movements intact.     Conjunctiva/sclera: Conjunctivae normal.     Pupils: Pupils are equal, round, and reactive to light.  Cardiovascular:     Rate and Rhythm: Normal rate.     Heart sounds: No murmur heard.    No friction rub. No gallop.  Pulmonary:     Effort: Pulmonary effort is normal. No respiratory distress.     Breath sounds: Normal breath sounds. No stridor. No wheezing or rhonchi.  Abdominal:     General: Bowel sounds are normal. There is no distension.     Palpations: Abdomen is soft.     Tenderness: There is no abdominal tenderness. There is no guarding or rebound.     Hernia: No hernia is present.  Musculoskeletal:        General: No swelling, tenderness or deformity.     Cervical back: Normal range of motion and neck supple. No rigidity or tenderness.     Right lower leg: No edema.     Left lower leg: No edema.  Lymphadenopathy:     Head:     Right side of head: No submental, submandibular, tonsillar, preauricular, posterior auricular or occipital adenopathy.     Left  side of head: No submental, submandibular, tonsillar, preauricular, posterior auricular or occipital adenopathy.     Cervical: No cervical adenopathy.     Right cervical: No superficial, deep or posterior cervical adenopathy.    Left cervical: No superficial, deep or posterior cervical adenopathy.     Upper Body:     Right upper body: No supraclavicular, axillary, pectoral or epitrochlear adenopathy.     Left upper body: No supraclavicular, axillary, pectoral or epitrochlear adenopathy.  Skin:    General: Skin is warm.     Coloration: Skin  is not jaundiced or pale.     Findings: No bruising or erythema.  Neurological:     General: No focal deficit present.     Mental Status: She is alert and oriented to person, place, and time. Mental status is at baseline.     Cranial Nerves: No cranial nerve deficit.     Gait: Gait abnormal.  Psychiatric:        Mood and Affect: Mood normal.        Behavior: Behavior normal.        Thought Content: Thought content normal.        Judgment: Judgment normal.     LABORATORY DATA: I have personally reviewed the data as listed:  Hospital Outpatient Visit on 11/18/2022  Component Date Value Ref Range Status   Glucose-Capillary 11/18/2022 97  70 - 99 mg/dL Final   Glucose reference range applies only to samples taken after fasting for at least 8 hours.  Appointment on 11/04/2022  Component Date Value Ref Range Status   Sodium 11/04/2022 135  135 - 145 mmol/L Final   Potassium 11/04/2022 4.2  3.5 - 5.1 mmol/L Final   Chloride 11/04/2022 100  98 - 111 mmol/L Final   CO2 11/04/2022 29  22 - 32 mmol/L Final   Glucose, Bld 11/04/2022 115 (H)  70 - 99 mg/dL Final   Glucose reference range applies only to samples taken after fasting for at least 8 hours.   BUN 11/04/2022 15  8 - 23 mg/dL Final   Creatinine 16/04/9603 0.77  0.44 - 1.00 mg/dL Final   Calcium 54/03/8118 11.0 (H)  8.9 - 10.3 mg/dL Final   Total Protein 14/78/2956 6.9  6.5 - 8.1 g/dL Final    Albumin 21/30/8657 2.9 (L)  3.5 - 5.0 g/dL Final   AST 84/69/6295 13 (L)  15 - 41 U/L Final   ALT 11/04/2022 6  0 - 44 U/L Final   Alkaline Phosphatase 11/04/2022 61  38 - 126 U/L Final   Total Bilirubin 11/04/2022 0.4  0.3 - 1.2 mg/dL Final   GFR, Estimated 11/04/2022 >60  >60 mL/min Final   Comment: (NOTE) Calculated using the CKD-EPI Creatinine Equation (2021)    Anion gap 11/04/2022 6  5 - 15 Final   Performed at [facility], [address] [place], [place] [address]   WBC Count 11/04/2022 15.8 (H)  4.0 - 10.5 K/uL Final   RBC 11/04/2022 4.04  3.87 - 5.11 MIL/uL Final   Hemoglobin 11/04/2022 10.4 (L)  12.0 - 15.0 g/dL Final   HCT 24/40/1027 33.6 (L)  36.0 - 46.0 % Final   MCV 11/04/2022 83.2  80.0 - 100.0 fL Final   MCH 11/04/2022 25.7 (L)  26.0 - 34.0 pg Final   MCHC 11/04/2022 31.0  30.0 - 36.0 g/dL Final   RDW 25/36/6440 15.9 (H)  11.5 - 15.5 % Final   Platelet Count 11/04/2022 705 (H)  150 - 400 K/uL Final   nRBC 11/04/2022 0.0  0.0 - 0.2 % Final   Neutrophils Relative % 11/04/2022 73  % Final   Neutro Abs 11/04/2022 11.5 (H)  1.7 - 7.7 K/uL Final   Lymphocytes Relative 11/04/2022 12  % Final   Lymphs Abs 11/04/2022 1.9  0.7 - 4.0 K/uL Final   Monocytes Relative 11/04/2022 12  % Final   Monocytes Absolute 11/04/2022 1.8 (H)  0.1 - 1.0 K/uL Final   Eosinophils Relative 11/04/2022 2  % Final   Eosinophils  Absolute 11/04/2022 0.4  0.0 - 0.5 K/uL Final   Basophils Relative 11/04/2022 0  % Final   Basophils Absolute 11/04/2022 0.1  0.0 - 0.1 K/uL Final   Immature Granulocytes 11/04/2022 1  % Final   Abs Immature Granulocytes 11/04/2022 0.13 (H)  0.00 - 0.07 K/uL Final   Performed at [facility], [address], [place], [place] [address]  Admission on 10/27/2022, Discharged on 10/27/2022  Component Date Value Ref Range Status   CYTOLOGY - NON GYN 10/27/2022    Final-Edited                   Value:CYTOLOGY - NON PAP CASE:  [id number] PATIENT: Galit Ripple Non-Gynecological Cytology Report     Clinical History: Right upper lobe mass. Specimen Submitted:  A. 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 Also received are 2 slides in 95% ethyl alcohol. (TB:emh) Prepared: Smears:  2 Concentration Method (Thin Prep):  1 Cell Block:  0 Additional Studies:  N/A.     Final Diagnosis performed by Orene Desanctis DO.   Electronically signed 10/29/2022 Technical component performed at [employer]. [facility], [address], [place], [place] [address].  Professional component performed at [facility], [address] [place], [place] [address].  Immunohistochemistry Technical component (if applicable) was performed at [facility]. [address], [place], [place] [address].   IMMUNOHISTOCHEMISTRY DISCLAIMER (if applicable): Some of these immunohistochemical stains may have been developed and the performance characteristics determine by [facility]. Some may not have been cleared or approved by the U.S. Food and Drug Administration. The FDA has determined that such clearance or approval is not necessary. This test is used for clinical purposes. It should not be regarded as investigational or for research. This laboratory is certified under the Clinical Laboratory Improvement Amendments of 1988 (CLIA-88) as qualified to perform high complexity clinical laboratory testing.  The controls stained appropriately.    SURGICAL PATHOLOGY 10/27/2022    Final-Edited                   Value:SURGICAL PATHOLOGY CASE: [id number] PATIENT: Adrea Kruk Surgical Pathology Report     Clinical History: right  upper lobe mass (cm)     FINAL MICROSCOPIC DIAGNOSIS:  A. 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    GROSS DESCRIPTION:  The specimen is received in formalin, and consists of multiple pieces of tan-red soft tissue, ranging from 0.1 to 1.1 x 0.3 x 0.2 cm.  The specimen is entirely submitted in 1 cassette.  Lovey Newcomer 10/27/2022)    Final Diagnosis performed by Orene Desanctis DO.   Electronically signed 10/29/2022 Technical component performed at [employer]. [facility], [address], [place], [place] [address].  Professional component performed at [facility], [address], [place], [place] [address].  Immunohistochemistry Technical component (if applicable) was performed at [facility]. [address], [place], [place] [address].   IMMUNOHISTOCHEMISTRY DISCLAIMER (if applicable): Some of these immunohistochemical stains may have been developed and the performance characteristics determine by [facility]. Some may not have been cleared or approved by the U.S. Food and Drug Administration. The FDA has determined that such clearance or approval is not necessary. This test is used for clinical purposes. It should not be regarded as investigational or for research. This laboratory is certified under the Clinical Laboratory Improvement Amendments of 1988 (CLIA-88) as qualified to perform high complexity clinical laboratory testing.  The controls stained appropriately.   Admission on 10/23/2022, Discharged on 10/25/2022  Component Date Value Ref Range Status   WBC 10/23/2022 17.8 (H)  4.0 - 10.5 K/uL Final   RBC 10/23/2022 4.00  3.87 - 5.11 MIL/uL Final   Hemoglobin 10/23/2022 10.1 (L)  12.0 - 15.0 g/dL Final   HCT 53/66/4403 34.7 (L)  36.0 - 46.0 % Final   MCV 10/23/2022 86.8  80.0 - 100.0 fL Final   MCH  10/23/2022 25.3 (L)  26.0 - 34.0 pg Final   MCHC 10/23/2022 29.1 (L)  30.0 - 36.0 g/dL Final   RDW 47/42/5956 15.6 (H)  11.5 - 15.5 % Final   Platelets 10/23/2022 723 (H)  150 - 400 K/uL Final   nRBC 10/23/2022 0.0  0.0 - 0.2 % Final   Performed at [facility], [address], [place], [place] [address]   Sodium 10/23/2022 137  135 - 145 mmol/L Final   Potassium 10/23/2022 4.4  3.5 - 5.1 mmol/L Final   HEMOLYSIS AT THIS LEVEL MAY AFFECT RESULT   Chloride 10/23/2022 104  98 - 111 mmol/L Final   CO2 10/23/2022 25  22 - 32 mmol/L Final   Glucose, Bld 10/23/2022 114 (H)  70 - 99 mg/dL Final   Glucose reference range applies only to samples taken after fasting for at least 8 hours.   BUN 10/23/2022 17  8 - 23 mg/dL Final   Creatinine, Ser 10/23/2022 0.64  0.44 - 1.00 mg/dL Final   Calcium 43/32/9518 9.6  8.9 - 10.3 mg/dL Final   GFR, Estimated 10/23/2022 >60  >60 mL/min Final   Comment: (NOTE) Calculated using the CKD-EPI Creatinine Equation (2021)    Anion gap 10/23/2022 8  5 - 15 Final   Performed at [facility], [address] [place], [place] [address]   Troponin I (High Sensitivity) 10/23/2022 3  <18 ng/L Final   Comment: (NOTE) Elevated high sensitivity troponin I (hsTnI) values and significant  changes across serial measurements may suggest ACS but many other  chronic and acute conditions are known to elevate hsTnI results.  Refer to the "Links" section for chest pain algorithms and additional  guidance. Performed at [facility], [address] [place], [place] [address]    SARS Coronavirus 2 by RT PCR 10/23/2022 NEGATIVE  NEGATIVE Final   Comment: (NOTE) SARS-CoV-2 target nucleic acids are NOT DETECTED.  The SARS-CoV-2 RNA is generally detectable in upper respiratory specimens during the acute phase of infection. The lowest concentration of SARS-CoV-2 viral copies this assay can detect is 138 copies/mL. A negative  result does not preclude SARS-Cov-2 infection and should not be used as the sole basis for treatment or other patient management decisions. A negative result may occur with  improper specimen collection/handling, submission of specimen other than nasopharyngeal swab, presence of viral mutation(s) within the areas targeted by this assay, and inadequate number of viral  copies(<138 copies/mL). A negative result must be combined with clinical observations, patient history, and epidemiological information. The expected result is Negative.  Fact Sheet for Patients:  BloggerCourse.com  Fact Sheet for Healthcare Providers:  SeriousBroker.it  This test is no                          t yet approved or cleared by the [place] FDA and  has been authorized for detection and/or diagnosis of SARS-CoV-2 by FDA under an Emergency Use Authorization (EUA). This EUA will remain  in effect (meaning this test can be used) for the duration of the COVID-19 declaration under Section 564(b)(1) of the Act, 21 U.S.C.section 360bbb-3(b)(1), unless the authorization is terminated  or revoked sooner.       Influenza A by PCR 10/23/2022 NEGATIVE  NEGATIVE Final   Influenza B by PCR 10/23/2022 NEGATIVE  NEGATIVE Final   Comment: (NOTE) The Xpert Xpress SARS-CoV-2/FLU/RSV plus assay is intended as an aid in the diagnosis of influenza from Nasopharyngeal swab specimens and should not be used as a sole basis for treatment. Nasal washings and aspirates are unacceptable for Xpert Xpress SARS-CoV-2/FLU/RSV testing.  Fact Sheet for Patients: BloggerCourse.com  Fact Sheet for Healthcare Providers: SeriousBroker.it  This test is not yet approved or cleared by the [place] FDA and has been authorized for detection and/or diagnosis of SARS-CoV-2 by FDA under an Emergency Use Authorization (EUA). This EUA will  remain in effect (meaning this test can be used) for the duration of the COVID-19 declaration under Section 564(b)(1) of the Act, 21 U.S.C. section 360bbb-3(b)(1), unless the authorization is terminated or revoked.     Resp Syncytial Virus by PCR 10/23/2022 NEGATIVE  NEGATIVE Final   Comment: (NOTE) Fact Sheet for Patients: BloggerCourse.com  Fact Sheet for Healthcare Providers: SeriousBroker.it  This test is not yet approved or cleared by the [place] FDA and has been authorized for detection and/or diagnosis of SARS-CoV-2 by FDA under an Emergency Use Authorization (EUA). This EUA will remain in effect (meaning this test can be used) for the duration of the COVID-19 declaration under Section 564(b)(1) of the Act, 21 U.S.C. section 360bbb-3(b)(1), unless the authorization is terminated or revoked.  Performed at [facility], [address], [place], [place] [address]    Sodium 10/24/2022 134 (L)  135 - 145 mmol/L Final   Potassium 10/24/2022 3.3 (L)  3.5 - 5.1 mmol/L Final   Chloride 10/24/2022 102  98 - 111 mmol/L Final   CO2 10/24/2022 25  22 - 32 mmol/L Final   Glucose, Bld 10/24/2022 98  70 - 99 mg/dL Final   Glucose reference range applies only to samples taken after fasting for at least 8 hours.   BUN 10/24/2022 15  8 - 23 mg/dL Final   Creatinine, Ser 10/24/2022 0.50  0.44 - 1.00 mg/dL Final   Calcium 60/45/4098 9.3  8.9 - 10.3 mg/dL Final   GFR, Estimated 10/24/2022 >60  >60 mL/min Final   Comment: (NOTE) Calculated using the CKD-EPI Creatinine Equation (2021)    Anion gap 10/24/2022 7  5 - 15 Final   Performed at [facility], [address] [place], [place] [address]   WBC 10/24/2022 15.7 (H)  4.0 - 10.5 K/uL Final   RBC 10/24/2022 3.70 (L)  3.87 - 5.11 MIL/uL Final   Hemoglobin 10/24/2022 9.3 (L)  12.0 - 15.0 g/dL Final   HCT 78/29/5621 31.8 (L)  36.0 - 46.0 % Final    MCV 10/24/2022 85.9  80.0 - 100.0 fL Final   MCH 10/24/2022 25.1 (L)  26.0 - 34.0 pg Final   MCHC 10/24/2022 29.2 (L)  30.0 - 36.0 g/dL Final   RDW 16/04/9603 15.6 (H)  11.5 - 15.5 % Final   Platelets 10/24/2022 638 (H)  150 - 400 K/uL Final   nRBC 10/24/2022 0.0  0.0 - 0.2 % Final   Neutrophils Relative % 10/24/2022 73  % Final   Neutro Abs 10/24/2022 11.4 (H)  1.7 - 7.7 K/uL Final   Lymphocytes Relative 10/24/2022 12  % Final   Lymphs Abs 10/24/2022 1.9  0.7 - 4.0 K/uL Final   Monocytes Relative 10/24/2022 10  % Final   Monocytes Absolute 10/24/2022 1.6 (H)  0.1 - 1.0 K/uL Final   Eosinophils Relative 10/24/2022 4  % Final   Eosinophils Absolute 10/24/2022 0.6 (H)  0.0 - 0.5 K/uL Final   Basophils Relative 10/24/2022 0  % Final   Basophils Absolute 10/24/2022 0.1  0.0 - 0.1 K/uL Final   Immature Granulocytes 10/24/2022 1  % Final   Abs Immature Granulocytes 10/24/2022 0.12 (H)  0.00 - 0.07 K/uL Final   Performed at [facility], [address] [place], [place] [address]   Glucose-Capillary 10/24/2022 86  70 - 99 mg/dL Final   Glucose reference range applies only to samples taken after fasting for at least 8 hours.   WBC 10/25/2022 15.4 (H)  4.0 - 10.5 K/uL Final   RBC 10/25/2022 3.53 (L)  3.87 - 5.11 MIL/uL Final   Hemoglobin 10/25/2022 9.1 (L)  12.0 - 15.0 g/dL Final   HCT 11/91/4782 29.7 (L)  36.0 - 46.0 % Final   MCV 10/25/2022 84.1  80.0 - 100.0 fL Final   MCH 10/25/2022 25.8 (L)  26.0 - 34.0 pg Final   MCHC 10/25/2022 30.6  30.0 - 36.0 g/dL Final   RDW 95/62/1308 15.6 (H)  11.5 - 15.5 % Final   Platelets 10/25/2022 632 (H)  150 - 400 K/uL Final   nRBC 10/25/2022 0.0  0.0 - 0.2 % Final   Neutrophils Relative % 10/25/2022 73  % Final   Neutro Abs 10/25/2022 11.2 (H)  1.7 - 7.7 K/uL Final   Lymphocytes Relative 10/25/2022 12  % Final   Lymphs Abs 10/25/2022 1.8  0.7 - 4.0 K/uL Final   Monocytes Relative 10/25/2022 10  % Final   Monocytes Absolute 10/25/2022  1.6 (H)  0.1 - 1.0 K/uL Final   Eosinophils Relative 10/25/2022 4  % Final   Eosinophils Absolute 10/25/2022 0.6 (H)  0.0 - 0.5 K/uL Final   Basophils Relative 10/25/2022 0  % Final   Basophils Absolute 10/25/2022 0.0  0.0 - 0.1 K/uL Final   Immature Granulocytes 10/25/2022 1  % Final   Abs Immature Granulocytes 10/25/2022 0.12 (H)  0.00 - 0.07 K/uL Final   Performed at [facility], [address], [place], [place] [address]   Sodium 10/25/2022 135  135 - 145 mmol/L Final   Potassium 10/25/2022 3.9  3.5 - 5.1 mmol/L Final   Chloride 10/25/2022 103  98 - 111 mmol/L Final   CO2 10/25/2022 24  22 - 32 mmol/L Final   Glucose, Bld 10/25/2022 95  70 - 99 mg/dL Final   Glucose reference range applies only to samples taken after fasting for at least 8 hours.   BUN 10/25/2022 11  8 - 23 mg/dL Final  Creatinine, Ser 10/25/2022 0.60  0.44 - 1.00 mg/dL Final   Calcium 45/40/9811 9.4  8.9 - 10.3 mg/dL Final   Phosphorus 91/47/8295 2.9  2.5 - 4.6 mg/dL Final   Albumin 62/13/0865 2.2 (L)  3.5 - 5.0 g/dL Final   GFR, Estimated 10/25/2022 >60  >60 mL/min Final   Comment: (NOTE) Calculated using the CKD-EPI Creatinine Equation (2021)    Anion gap 10/25/2022 8  5 - 15 Final   Performed at [facility], [address], [place], [place] [address]    RADIOGRAPHIC STUDIES: I have personally reviewed the radiological images as listed and agree with the findings in the report  NM PET Image Initial (PI) Skull Base To Thigh  Result Date: 11/20/2022 CLINICAL DATA:  Initial treatment strategy for Lung cancer. EXAM: NUCLEAR MEDICINE PET SKULL BASE TO THIGH TECHNIQUE: 7.79 mCi F-18 FDG was injected intravenously. Full-ring PET imaging was performed from the skull base to thigh after the radiotracer. CT data was obtained and used for attenuation correction and anatomic localization. Fasting blood glucose: 97 mg/dl COMPARISON:  None FINDINGS: Mediastinal blood pool activity:  SUV max 2.2 Liver activity: SUV max NA NECK: No hypermetabolic lymph nodes in the neck. Incidental CT findings: None. CHEST: Large, centrally necrotic mass within the right upper lobe measures 10.7 x 7.9 cm and has an SUV max 29.37, image 43/4. Signs of chest wall invasion identified with tumor extending through the right fourth and fifth rib interspace anteriorly. Tumor extends into the right hilar with encasement of the right upper lobe and right middle lobe bronchus. There are several tracer avid nodules within the left lung. The largest is in the anterior left upper lobe which measuring 1.2 cm with an SUV max 6.91, image 35/4. Tracer avid right paratracheal lymph node is identified measuring 8 mm with SUV max of 5.68, image 37/4. Small right pleural effusion is identified which exhibits mild tracer uptake with SUV max of 2.13. Incidental CT findings: Mild cardiac enlargement. Aortic atherosclerosis. Coronary artery calcifications. ABDOMEN/PELVIS: No abnormal tracer uptake identified within the liver, pancreas, spleen, or adrenal glands. No tracer avid abdominopelvic lymph nodes. Incidental CT findings: Right adrenal nodule measures 2.2 cm. This is unchanged from 02/21/2021 and most likely represents a benign adenoma. Mildly complex cyst is noted arising off the posterior upper pole of left kidney without corresponding increased uptake. This likely represents a benign proteinaceous or hemorrhagic cyst. No follow-up imaging recommended. Simple appearing cyst arises off the anterior cortex of the left kidney measuring 1.6 cm. No follow-up imaging recommended. SKELETON: Increased radiotracer uptake is noted localizing to the distal right clavicle. The SUV max is equal to 3.18, image 27/4. On the corresponding CT images there is mild sclerosis concerning for bone metastasis. At the site of chest wall invasion there are erosive changes involving the anterolateral aspect of the right fourth rib, image 43/4. Incidental  CT findings: None. IMPRESSION: 1. Large, centrally necrotic mass within the right upper lobe is intensely FDG avid and is compatible with primary bronchogenic carcinoma. There is evidence of chest wall invasion with tumor extending through the right fourth and fifth rib interspace anteriorly. Erosive changes are identified involving the right fourth rib. 2. FDG avid right paratracheal lymph node compatible with nodal metastasis. 3. Multiple tracer avid nodules are identified within the left lung compatible with pulmonary metastasis. 4. Increased radiotracer uptake is noted localizing to the distal right clavicle. There are associated sclerotic changes involving the distal right clavicle concerning for bone  metastasis. 5. Small right pleural effusion exhibits mild tracer uptake. Cannot exclude malignant effusion. 6. Aortic Atherosclerosis (ICD10-I70.0). Electronically Signed   By: Signa Kell M.D.   On: 11/20/2022 19:22    ASSESSMENT/PLAN 82 y.o. female with medical history notable for arthritis, history of melanoma, colon polyps, COPD, coronary artery disease, GERD, osteoporosis, hypertension, CVA, acute kidney injury.  Patient seen initially for evaluation and management of anemia who was subsequently diagnosed with squamous cell carcinoma of the lung  Squamous cell carcinoma of right lung, Stage IV (T4 N2 M1)  October 23 2022- CT PA showed large RUL mass encompassing several ribs.  Spiculated lesions in LUL  November 05 2022- MRI brain negative for metastasis November 18 2022- PET/CT  Necrotic RUL mass 10.7 x 7.9 cm (SUV 29.4) with invasion into right fourth and fifth rib interspace. Tumor extends into right hilum with encasement of RUL and RML bronchus. Tracer avid nodules in left lung  and right paratracheal lymph node. Increased radiotracer uptake localized to distal right clavicle (SUV 3.18) with sclerotic changes on CT.  November 21 2022- Sent [facility]le Nordmann 454  Therapeutics:  November 21 2022- Patient has  advanced lung cancer which can not be cured.  Goal of therapy is palliation.  Her respiratory status is at risk owing to her underlying lung disease and the geography/large volume of her tumor.  Recommend radiation oncology [facility] for consideration of therapy for local control.  Following this would consider palliative systemic therapy.    Poor venous access:  In preparation for chemotherapy we will arrange for port-a-cath placement.  We discussed benefits (more efficient access to administer chemotherapy and supportive care medications/IVF, imaging contrast, decreased risk of tissue damage from medications) and disadvantages (risks of infections, thrombosis, surgical procedure, periodic flushing).  Patient has elected to proceed with port placement.      Cancer related pain November 21 2022- Currently taking Hydrocodone/APAP 5/325 mg q 6 hrs PRN with incomplete relief.  Recommend MSIR 7.5 mg po q 4 hrs PRN.  Patient hesitant to use morphine.  Wrote for Hydrocodone/APAP 7.5/300 instead.  Chest wall pain should improve with XRT.     Anemia:  Likely multifactorial with major component being chronic [facility] blood loss.  Contributing factors are likely CKD, B12 deficiency.  Patient on chronic antiplatelet therapy with ASA.  Has history of colon polyps.  Treated with IV iron and B12 injections July 11, 2022: Feraheme 510 mg IV  August 15 2022:  Hgb improved to 11.2.  Patient to schedule [facility] appointment.  May take oral B12 and continue folic acid  Hypercalcemia:  No evidence of Myeloma.  Patient is taking Calcium with Vitamin D which may be the culprit.     Cancer Staging  No matching staging information was found for the patient.   No problem-specific Assessment & Plan notes found for this encounter.   No orders of the defined types were placed in this encounter.  45  minutes was spent in patient care.  This included time spent preparing to see the patient (e.g., review of tests), obtaining and/or  reviewing separately obtained history, counseling and educating the patient/family/caregiver, ordering medications, tests, or procedures; documenting clinical information in the electronic or other health record, independently interpreting results and communicating results to the patient/family/caregiver as well as coordination of care.      All questions were answered. The patient knows to call the [facility] with any problems, questions or concerns.  This note was electronically signed.    Mitzie Na  Letta Moynahan, MD  11/21/2022 12:38 PM

## 2022-11-24 ENCOUNTER — Telehealth: Payer: Self-pay | Admitting: Nurse Practitioner

## 2022-11-24 ENCOUNTER — Encounter: Payer: Self-pay | Admitting: Oncology

## 2022-11-24 ENCOUNTER — Other Ambulatory Visit: Payer: Self-pay

## 2022-11-24 NOTE — Telephone Encounter (Signed)
Dr. Chales Abrahams, refer to office visit 11/13/2022 and review PET scan which showed the following: IMPRESSION: 1. Large, centrally necrotic mass within the right upper lobe is intensely FDG avid and is compatible with primary bronchogenic carcinoma. There is evidence of chest wall invasion with tumor extending through the right fourth and fifth rib interspace anteriorly. Erosive changes are identified involving the right fourth rib. 2. FDG avid right paratracheal lymph node compatible with nodal metastasis. 3. Multiple tracer avid nodules are identified within the left lung compatible with pulmonary metastasis. 4. Increased radiotracer uptake is noted localizing to the distal right clavicle. There are associated sclerotic changes involving the distal right clavicle concerning for bone metastasis. 5. Small right pleural effusion exhibits mild tracer uptake. Cannot exclude malignant effusion. 6. Aortic Atherosclerosis  Dr. Chales Abrahams, let me know your recommendations  regarding any future endoscopic evaluation in setting of lung cancer with bone mets and I will contact the patient. THX.

## 2022-11-25 ENCOUNTER — Other Ambulatory Visit: Payer: Self-pay

## 2022-11-25 ENCOUNTER — Telehealth: Payer: Self-pay | Admitting: Oncology

## 2022-11-25 NOTE — Telephone Encounter (Signed)
PET scan positive for metastatic lung cancer She should have follow-up with pulmonary and oncology No need for any elective endoscopic procedures given above information RG

## 2022-11-25 NOTE — Progress Notes (Signed)
Thoracic Location of Tumor / Histology: Squamous cell carcinoma of right lung  10-23-22 CT Angio Chest PE W and/or Wo Contrast  IMPRESSION: Changes consistent with central occluding neoplasm in the right upper lobe with likely peripheral consolidation. Referral to multi disciplinary pulmonary team is recommended. Tissue sampling and likely PET-CT are also recommended for further evaluation.   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   Spiculated densities in the left upper lobe which may represent metastatic disease.   No evidence of pulmonary emboli.   Aortic Atherosclerosis (ICD10-I70.0) and Emphysema (ICD10-J43.9).  NM PET Image Initial (PI) Skull Base To Thigh 11-18-22 IMPRESSION: 1. Large, centrally necrotic mass within the right upper lobe is intensely FDG avid and is compatible with primary bronchogenic carcinoma. There is evidence of chest wall invasion with tumor extending through the right fourth and fifth rib interspace anteriorly. Erosive changes are identified involving the right fourth rib. 2. FDG avid right paratracheal lymph node compatible with nodal metastasis. 3. Multiple tracer avid nodules are identified within the left lung compatible with pulmonary metastasis. 4. Increased radiotracer uptake is noted localizing to the distal right clavicle. There are associated sclerotic changes involving the distal right clavicle concerning for bone metastasis. 5. Small right pleural effusion exhibits mild tracer uptake. Cannot exclude malignant effusion. 6. Aortic Atherosclerosis (ICD10-I70.0).  Patient presented with symptoms of: (Per Dr. Kavin Leech note on 10-27-22) Courtney [facility] is 82 y.o. with history of tobacco use.  She was admitted to the hospital with right shoulder, subscapular pain.  Found to have a right upper lobe mass.  Treated for postobstructive pneumonia.   Biopsies revealed:  10-27-22 FINAL MICROSCOPIC DIAGNOSIS:   A. LUNG,  RIGHT UPPER LOBE, ENDOBRONCHIAL, BIOPSY:  -  Poorly differentiated squamous cell carcinoma (p63 positive, TTF1  negative).   Tobacco/Marijuana/Snuff/ETOH use: reports that she quit smoking about 17 years ago.  She reports current alcohol use of about 2.0 standard drinks of alcohol per week.   Past/Anticipated interventions by cardiothoracic surgery, if any:  Video Bronchoscopy Procedure Note   Date of Operation: 10/27/2022   Pre-op Diagnosis: Right upper lobe mass   Post-op Diagnosis: Same   Surgeon: [name]  Past/Anticipated interventions by medical oncology, if any:  Heilingoetter, Cassandra L, PA-C 11-04-22 ASSESSMENT: This is a very pleasant 82 year old Caucasian female referred to the clinic for non-small cell lung cancer, squamous cell carcinoma.  She presented with a right upper lobe lung mass with bone metastasis to the right fourth rib, and spiculated densities in the left upper lobe concerning for metastatic disease.  Pending further staging workup with PET scan and brain MRI which is scheduled for 4/10 and 4/25.    Dr. [name] had a lengthly discussion with the patient today about her current condition and treatment options.  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 We reached out to him to see if he would like to continue to follow with the patient for her lung cancer diagnosis. Otherwise, we would be happy to take care of her. Dr. [name] would like to continue to follow with the patient for her lung cancer diagnosis in addition to IDA.    Dr. [name] recommended waiting for the results for PDL1. If high expression, he would consider treatment with immunotherapy. If low expression, he discussed chemotherapy/immunotherapy. However, he will defer the decision to Dr. [name] and decision to order additional testing.    In the meantime, we have moved up the appointment date for  her PET scan.    She also has hypercalcemia on labs today, she was  instructed to hold her calcium supplement for the time being.    We will arrange for follow up with Dr. [name] after her MRI and PET scan for a more detailed discussion about her current condition and treatment options. She may benefit from palliative radiation to the chest   Dr. [name] on 11-21-22 October 23, 2022: Presented to [facility] ED with several month history of right upper chest and shoulder discomfort Chest x-ray showed large rounded mass in the right upper lobe.  CTPA demonstrated small right paratracheal and subcarinal lymph nodes.  No left hilar adenopathy.  Diffuse emphysematous changes.  Small right pleural effusion.  Large geographic area of soft tissue density in right upper lobe with mass effect.  Majority of right upper lobe bronchial tissues are occluded related to a central hilar mass.  Bony metastatic disease within the right fourth rib.  Spiculated lesions in left upper lobe which may represent metastasis  October 27, 2022: Bronchoscopy-exophytic mass emanating from anterior and posterior segment total airways of right upper lobe bronchus Pathology demonstrated poorly differentiated squamous cell carcinoma   November 05, 2022: MRI of brain negative for metastasis.  Moderate generalized atrophy and white matter disease likely from chronic microvascular ischemia.  Remote lacunar infarcts   November 18, 2022: PET/CT-centrally necrotic mass within right upper lobe measuring 10.7 x 7.9 cm (SUV 29.4).  Chest wall invasion with tumor extending into right fourth and fifth rib interspace.  Tumor extends into right hilum with encasement of right upper lobe and right middle lobe bronchus.  Several tracer avid nodules in left lung largest is in anterior left upper lobe measuring 1.2 cm (SUV 6.9) tracer avid right paratracheal lymph node.  Right adrenal nodule stable from prior imaging in July 2022.  Increased radiotracer uptake localized to distal right clavicle (SUV 3.18) with corresponding  sclerotic changes on CT.   November 21 2022:  Scheduled follow up for newly diagnosed lung cancer.  Discussed results of labs and imaging with patient and daughter Here with daughter [name], with whom she lives.   Taking Hydrocodone/APAP 5/325 mg q 6 hrs PRN for the chest wall pain but finds that it barely lasts through that time.  Discussed use of MSIR but would prefer to avoid it at this time  Will refer to Radiation Oncology for consideration of palliative XRT to RUL Guardant 360 ordered Refer for port placement  Signs/Symptoms Weight changes, if any: yes, has noticed small weight loss with appetite decease, nausea reported at times Respiratory complaints, if any: yes, shortness of breath at baseline for COPD, feels like shortness of breath is worse now however since diagnosis Hemoptysis, if any: no Pain issues, if any:  yes, baseline 5/10 right shoulder, ribs, and under breast (at times 10/10)   SAFETY ISSUES: Prior radiation? no Pacemaker/ICD? no  Possible current pregnancy? na Is the patient on methotrexate? no  Current Complaints / other details:  No Major concerns at this time. She did think her port was going to be for radiation. Rn educated her on what her port would be used for. Wants a flexible schedule for radiation so daughter can take her.

## 2022-11-26 NOTE — Telephone Encounter (Signed)
Left message for pt to call back  °

## 2022-11-26 NOTE — Telephone Encounter (Signed)
Courtney Brown, pls contact patient and let her know based on her PET scan results Dr. Chales Abrahams did not recommend any endoscopic evaluation. At the time of her office visit, I did discuss with her that an EGD/colonoscopy may not be appropriate in the setting of her recently diagnosed lung cancer with bone metastasis. She needs to continue follow up with her oncologist and pulmonologist. Tacy Learn.

## 2022-11-26 NOTE — Progress Notes (Signed)
Prior authorization request for Hydrocodone -Acetaminophen 7.5/300 mg denied by [employer], per Insurance, Patient needs to try 3 of the covered drugs in formulary. Covered drugs are: (a)Acetaminophen-caffeine-dihydrocodeine. (b) Acetaminophen-codeine. (c) Hydrocodone-ibuprofen 7.5-200mg . (d) Tramadol-acetaminophen.  Provider may appeal the decision and give a specific medical reason as to why the three covered medications are not appropriate for Patient. Provider notified.

## 2022-11-26 NOTE — Telephone Encounter (Signed)
Pt was made aware of Alcide Evener NP and Dr. Chales Abrahams recommendations: Pt verbalized understanding with all questions answered.

## 2022-11-28 ENCOUNTER — Other Ambulatory Visit: Payer: Self-pay | Admitting: Oncology

## 2022-11-28 ENCOUNTER — Other Ambulatory Visit: Payer: Self-pay

## 2022-11-28 DIAGNOSIS — C3490 Malignant neoplasm of unspecified part of unspecified bronchus or lung: Secondary | ICD-10-CM | POA: Diagnosis not present

## 2022-11-28 DIAGNOSIS — C3491 Malignant neoplasm of unspecified part of right bronchus or lung: Secondary | ICD-10-CM

## 2022-11-28 MED ORDER — MORPHINE SULFATE 15 MG PO TABS: 7.5000 mg | ORAL_TABLET | ORAL | 0 refills | Status: DC | PRN

## 2022-11-30 NOTE — Progress Notes (Signed)
Radiation Oncology         [phone] ________________________________  Initial Outpatient Consultation  Name: Courtney Brown MRN: [id number]  Date: 12/02/2022  DOB: [birth year]  WU:[id number], Bernadene Bell, MD  Loni Muse, MD   REFERRING PHYSICIAN: Loni Muse, MD  DIAGNOSIS: The primary encounter diagnosis was Malignant neoplasm of right upper lobe of lung (HCC). A diagnosis of Squamous cell carcinoma of right lung (HCC) was also pertinent to this visit.  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 female who  is seen as a courtesy of Dr. [name] for an opinion concerning radiation therapy as part of management for her recently diagnosed right lung cancer. Prior to her recent diagnosis, the patient was already established with Dr. [name] for iron deficiency anemia.   The patient presented to the [facility] ED on 10/23/22 with c/o dyspnea, inability to take deep breaths, right-sided chest pain with radiation to her upper back x 2 weeks, associated cough, and weight loss secondary to decreased appetite. Chest x-ray performed in the ED showed a large rounded mass located in the right upper lobe highly concerning for malignancy. She was accordingly admitted for further work-up which included a CTA of the chest on 03/28 which demonstrated changes consistent with the central occluding neoplasm in the right upper lobe (measuring approximately 3 cm) with likely peripheral consolidation (probable postobstructive pneumonia), along with evidence of osseous metastatic disease involving the right 4th rib laterally, and spiculated densities in the left upper lobe possibly concerning for metastatic disease. CT otherwise showed no evidence of PE. Hospital course included antibiotics for  possible postobstructive pneumonia (she was also discharged with a 7 day course of Augmentin). She was evaluated by Dr. Delton Coombes while inpatient who recommended bronchoscopy. This was attempted while inpatient but unfortunately couldn't be accomplished. She was subsequently arranged for an OP bronchoscopy to be performed following her discharge on 10/25/22.   Accordingly, the patient proceeded to undergo bronchoscopy with biopsies on 10/27/22 under the care of Dr. Delton Coombes. Biopsy of the right upper lobe collected showed findings consistent with poorly differentiated squamous cell carcinoma (p63 positive, TTF1  negative).  RUL endobrachial brushing also collected showed findings consistent with non-small cell carcinoma.    Subsequently, the patient was referred to medical oncology and was seen in consultation by PA Heilingoetter on 11/04/22 to discuss treatment options. In addition to proceeding with staging work-up, PD-L1 testing was ordered for treatment planning. Pending staging work-up and molecular studies, the patient was recommended chemotherapy consisting of Carboplatin and Paclitaxel with [place]. (Molecular studies are still pending at this time).  The patient was referred back to Dr. [name] since she was already established under his care. Dr. [name] has ultimately recommended radiation therapy to the right upper lobe, which we will discuss in detail today. Dr. [name] is also working on placing a referral for port placement.   Staging work-up is detailed as follows:  -- MRI of the brain on 11/05/22 demonstrated no evidence of intracranial metastatic disease.  -- PET scan on 11/18/22 demonstrated: the large, centrally necrotic mass within the right upper lobe as intensely FDG avid and is compatible with primary bronchogenic carcinoma; evidence of chest wall invasion with tumor extending through the right fourth and fifth rib interspace anteriorly; erosive changes identified involving the right  fourth rib; an FDG avid right paratracheal lymph node compatible with nodal metastasis; multiple tracer avid nodules within the left lung compatible with pulmonary metastasis; increased radiotracer uptake localizing to the distal right clavicle with associated sclerotic changes involving the distal right clavicle concerning for bone metastasis; and a small right pleural effusion with mild tracer uptake.   Of note: the patient's case was presented at the tumor board held on 11/06/22.   PREVIOUS RADIATION THERAPY: No  PAST MEDICAL HISTORY:  Past Medical History:  Diagnosis Date   Allergy    Anxiety    Arthritis    Cancer (HCC)    hx of melanoma    Colon polyps    COPD (chronic obstructive pulmonary disease) (HCC)    Coronary artery disease    Dyspnea    with exertion    Edema    in lower extremties    Emphysema of lung (HCC)    GERD (gastroesophageal reflux disease)    H/O hiatal hernia    Hyperlipidemia    Hypertension    Inverted nipple    bilateral, pt says that's normal   Lung cancer (HCC) 10/23/2022   Malignant melanoma (HCC) 1983   again in 1995]   Myocardial infarct, old 2008   no stent placed   Osteoporosis    Pneumonia    hx of    Sepsis (HCC)    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    Stroke [facility])    TIA    PAST SURGICAL HISTORY: Past Surgical History:  Procedure Laterality Date   ABDOMINAL HYSTERECTOMY  1998   BRONCHIAL BIOPSY  10/27/2022   Procedure: BRONCHIAL BIOPSIES;  Surgeon: Leslye Peer, MD;  Location: [facility] ENDOSCOPY;  Service: Cardiopulmonary;;   BRONCHIAL BRUSHINGS  10/27/2022   Procedure: BRONCHIAL BRUSHINGS;  Surgeon: Leslye Peer, MD;  Location: [facility] ENDOSCOPY;  Service: Cardiopulmonary;;   CHOLECYSTECTOMY  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   HEMOSTASIS CONTROL  10/27/2022   Procedure: HEMOSTASIS CONTROL;  Surgeon: Leslye Peer, MD;  Location: [facility] ENDOSCOPY;  Service: Cardiopulmonary;;   HERNIA  REPAIR     umbilical, ventral   INCISIONAL HERNIA REPAIR N/A 10/26/2020   Procedure: DIAGNOSTIC LAPAROSCOPY MESH EXPLANTATION; SMALL BOWEL RESECTION; PRIMARY REPAIR OF INCISIONAL HERNIA;  Surgeon: Sheliah Hatch De Blanch, MD;  Location: [facility] ORS;  Service: General;  Laterality: N/A;   INSERTION OF ILIAC STENT  2008   VIDEO BRONCHOSCOPY Right 10/27/2022   Procedure: VIDEO BRONCHOSCOPY WITHOUT FLUORO;  Surgeon: Leslye Peer, MD;  Location: [facility] ENDOSCOPY;  Service: Cardiopulmonary;  Laterality: Right;    FAMILY HISTORY:  Family History  Problem Relation Age of Onset   Arthritis Mother    Hearing loss Father    Colon cancer Brother    Hearing loss Maternal Grandmother    Cancer Maternal Grandmother        colon   Cancer Paternal Grandmother        colon   Hearing loss Maternal Aunt    Breast cancer Neg Hx     SOCIAL HISTORY:  Social History   Tobacco Use   Smoking status: Former    Packs/day: 1.50    Years: 44.00    Additional pack years: 0.00    Total pack years: 66.00    Types: Cigarettes    Quit date: 07/28/2005    Years since quitting: 17.3   Smokeless tobacco: Never  Vaping Use   Vaping Use: Never  used  Substance Use Topics   Alcohol use: Yes    Alcohol/week: 2.0 standard drinks of alcohol    Types: 2 Shots of liquor per week    Comment: rarely   Drug use: No    ALLERGIES:  Allergies  Allergen Reactions   Prednisone Other (See Comments)    Leg pain,light headed   Statins     Myalgias- per patient, she feels better off the medication    MEDICATIONS:  Current Outpatient Medications  Medication Sig Dispense Refill   albuterol (VENTOLIN HFA) 108 (90 Base) MCG/ACT inhaler INHALE 1-2 PUFFS BY MOUTH EVERY 6 HOURS AS NEEDED FOR WHEEZE OR SHORTNESS OF BREATH 6.7 each 3   ALPRAZolam (XANAX) 0.25 MG tablet Take 1 tablet (0.25 mg total) by mouth at bedtime.     budesonide-formoterol (SYMBICORT) 160-4.5 MCG/ACT inhaler TAKE 2 PUFFS BY MOUTH TWICE A DAY 30.6 each 1   Calcium  Citrate-Vitamin D (CITRACAL + D PO) Take 1 tablet by mouth in the morning. 600 mg calcium     colesevelam (WELCHOL) 625 MG tablet TAKE 1 TABLET (625 MG TOTAL) BY MOUTH 2 (TWO) TIMES DAILY WITH A MEAL. 180 tablet 1   fenofibrate (TRICOR) 145 MG tablet TAKE 1 TABLET BY MOUTH EVERY DAY 90 tablet 1   Ferric Maltol (ACCRUFER) 30 MG CAPS Take 1 capsule by mouth in the morning and at bedtime. 180 capsule 0   folic acid (FOLVITE) 1 MG tablet TAKE 1 TABLET BY MOUTH EVERY DAY 90 tablet 1   furosemide (LASIX) 20 MG tablet TAKE 1 TABLET BY MOUTH EVERY DAY 90 tablet 0   gabapentin (NEURONTIN) 100 MG capsule Take 1-3 capsules (100-300 mg total) by mouth 2 (two) times daily. Take 1 capsule (100 mg) in the morning and Take 3 capsules (300 mg) at bedtime     Garlic Oil 1000 MG CAPS Take 1,000 mg by mouth daily.     HYDROcodone-Acetaminophen 7.5-300 MG TABS Take 1 tablet by mouth 4 (four) times daily as needed. 180 tablet 0   Lactobacillus (PROBIOTIC ACIDOPHILUS) CAPS Take 1 tablet by mouth daily.     loratadine (CLARITIN) 10 MG tablet TAKE 1 TABLET BY MOUTH EVERY DAY 90 tablet 1   magnesium oxide (MAG-OX) 400 (240 Mg) MG tablet TAKE 1 TABLET BY MOUTH TWICE A DAY 60 tablet 2   metoprolol succinate (TOPROL-XL) 50 MG 24 hr tablet Take 1 tablet (50 mg total) by mouth daily. 90 tablet 0   mometasone (NASONEX) 50 MCG/ACT nasal spray PLACE 2 SPRAYS INTO THE NOSE DAILY. 51 each 3   morphine (MSIR) 15 MG tablet Take 0.5 tablets (7.5 mg total) by mouth every 4 (four) hours as needed for severe pain. 60 tablet 0   omeprazole (PRILOSEC) 20 MG capsule TAKE 1 CAPSULE BY MOUTH EVERY DAY 90 capsule 1   ondansetron (ZOFRAN) 4 MG tablet Take 1 tablet (4 mg total) by mouth every 8 (eight) hours as needed for nausea or vomiting. 20 tablet 0   primidone (MYSOLINE) 50 MG tablet Take 1 tablet (50 mg total) by mouth at bedtime. 90 tablet 3   raloxifene (EVISTA) 60 MG tablet TAKE 1 TABLET BY MOUTH EVERY DAY 90 tablet 1   triamcinolone  cream (KENALOG) 0.1 % Apply 1 Application topically daily as needed (yeast infection under breast).     Zinc 50 MG TABS TAKE 1 TABLET BY MOUTH EVERY DAY 90 tablet 1   No current facility-administered medications for this encounter.    REVIEW  OF SYSTEMS:  A 10+ POINT REVIEW OF SYSTEMS WAS OBTAINED including neurology, dermatology, psychiatry, cardiac, respiratory, lymph, extremities, GI, GU, musculoskeletal, constitutional, reproductive, HEENT.  She denies any hemoptysis.  She reports dyspnea on exertion.  No use of supplemental oxygen at home.  She reports pain localized to the right upper chest and back region   PHYSICAL EXAM:  height is 5' (1.524 m) and weight is 140 lb 4 oz (63.6 kg). Her temporal temperature is 96.3 F (35.7 C) (abnormal). Her blood pressure is 112/47 (abnormal) and her pulse is 93. Her respiration is 18 and oxygen saturation is 95%.   General: Alert and oriented, in no acute distress, somewhat hard of hearing.  Remains in wheelchair for evaluation. HEENT: Head is normocephalic. Extraocular movements are intact.  Neck: Neck is supple, no palpable cervical or supraclavicular lymphadenopathy. Heart: Regular in rate and rhythm with no murmurs, rubs, or gallops. Chest: Clear to auscultation bilaterally except for a mildly decreased breath sounds in the right lung field, with no rhonchi, wheezes, or rales. Abdomen: Soft, nontender, nondistended, with no rigidity or guarding. Extremities: No cyanosis or edema. Lymphatics: see Neck Exam Skin: No concerning lesions. Musculoskeletal: symmetric strength and muscle tone throughout. Neurologic: Cranial nerves II through XII are grossly intact. No obvious focalities. Speech is fluent. Coordination is intact. Psychiatric: Judgment and insight are intact. Affect is appropriate.   ECOG = 1  0 - Asymptomatic (Fully active, able to carry on all predisease activities without restriction)  1 - Symptomatic but completely ambulatory  (Restricted in physically strenuous activity but ambulatory and able to carry out work of a light or sedentary nature. For example, light housework, office work)  2 - Symptomatic, <50% in bed during the day (Ambulatory and capable of all self care but unable to carry out any work activities. Up and about more than 50% of waking hours)  3 - Symptomatic, >50% in bed, but not bedbound (Capable of only limited self-care, confined to bed or chair 50% or more of waking hours)  4 - Bedbound (Completely disabled. Cannot carry on any self-care. Totally confined to bed or chair)  5 - Death   Santiago Glad MM, Creech RH, Tormey DC, et al. [phone]). "Toxicity and response criteria of the [facility] Group". Am. Evlyn Clines. Oncol. 5 (6): 649-55  LABORATORY DATA:  Lab Results  Component Value Date   WBC 17.8 (H) 11/21/2022   HGB 10.1 (L) 11/21/2022   HCT 33.6 (L) 11/21/2022   MCV 82.6 11/21/2022   PLT 767 (H) 11/21/2022   NEUTROABS 14.5 (H) 11/21/2022   Lab Results  Component Value Date   NA 135 11/04/2022   K 4.2 11/04/2022   CL 100 11/04/2022   CO2 29 11/04/2022   GLUCOSE 115 (H) 11/04/2022   BUN 15 11/04/2022   CREATININE 0.77 11/04/2022   CALCIUM 11.0 (H) 11/04/2022      RADIOGRAPHY: NM PET Image Initial (PI) Skull Base To Thigh  Result Date: 11/20/2022 CLINICAL DATA:  Initial treatment strategy for Lung cancer. EXAM: NUCLEAR MEDICINE PET SKULL BASE TO THIGH TECHNIQUE: 7.79 mCi F-18 FDG was injected intravenously. Full-ring PET imaging was performed from the skull base to thigh after the radiotracer. CT data was obtained and used for attenuation correction and anatomic localization. Fasting blood glucose: 97 mg/dl COMPARISON:  None FINDINGS: Mediastinal blood pool activity: SUV max 2.2 Liver activity: SUV max NA NECK: No hypermetabolic lymph nodes in the neck. Incidental CT findings: None. CHEST: Large, centrally necrotic mass  within the right upper lobe measures 10.7 x 7.9 cm and has  an SUV max 29.37, image 43/4. Signs of chest wall invasion identified with tumor extending through the right fourth and fifth rib interspace anteriorly. Tumor extends into the right hilar with encasement of the right upper lobe and right middle lobe bronchus. There are several tracer avid nodules within the left lung. The largest is in the anterior left upper lobe which measuring 1.2 cm with an SUV max 6.91, image 35/4. Tracer avid right paratracheal lymph node is identified measuring 8 mm with SUV max of 5.68, image 37/4. Small right pleural effusion is identified which exhibits mild tracer uptake with SUV max of 2.13. Incidental CT findings: Mild cardiac enlargement. Aortic atherosclerosis. Coronary artery calcifications. ABDOMEN/PELVIS: No abnormal tracer uptake identified within the liver, pancreas, spleen, or adrenal glands. No tracer avid abdominopelvic lymph nodes. Incidental CT findings: Right adrenal nodule measures 2.2 cm. This is unchanged from 02/21/2021 and most likely represents a benign adenoma. Mildly complex cyst is noted arising off the posterior upper pole of left kidney without corresponding increased uptake. This likely represents a benign proteinaceous or hemorrhagic cyst. No follow-up imaging recommended. Simple appearing cyst arises off the anterior cortex of the left kidney measuring 1.6 cm. No follow-up imaging recommended. SKELETON: Increased radiotracer uptake is noted localizing to the distal right clavicle. The SUV max is equal to 3.18, image 27/4. On the corresponding CT images there is mild sclerosis concerning for bone metastasis. At the site of chest wall invasion there are erosive changes involving the anterolateral aspect of the right fourth rib, image 43/4. Incidental CT findings: None. IMPRESSION: 1. Large, centrally necrotic mass within the right upper lobe is intensely FDG avid and is compatible with primary bronchogenic carcinoma. There is evidence of chest wall invasion with  tumor extending through the right fourth and fifth rib interspace anteriorly. Erosive changes are identified involving the right fourth rib. 2. FDG avid right paratracheal lymph node compatible with nodal metastasis. 3. Multiple tracer avid nodules are identified within the left lung compatible with pulmonary metastasis. 4. Increased radiotracer uptake is noted localizing to the distal right clavicle. There are associated sclerotic changes involving the distal right clavicle concerning for bone metastasis. 5. Small right pleural effusion exhibits mild tracer uptake. Cannot exclude malignant effusion. 6. Aortic Atherosclerosis (ICD10-I70.0). Electronically Signed   By: Signa Kell M.D.   On: 11/20/2022 19:22   MR BRAIN W WO CONTRAST  Result Date: 11/05/2022 CLINICAL DATA:  Non-small cell lung cancer. EXAM: MRI HEAD WITHOUT AND WITH CONTRAST TECHNIQUE: Multiplanar, multiecho pulse sequences of the brain and surrounding structures were obtained without and with intravenous contrast. CONTRAST:  6mL GADAVIST GADOBUTROL 1 MMOL/ML IV SOLN COMPARISON:  CT head without contrast 08/31/2019 FINDINGS: Brain: No acute infarct, hemorrhage, or mass lesion is present. Moderate generalized atrophy and white matter disease is again noted, similar the prior study. Deep brain nuclei are within normal limits. The ventricles are proportionate to the degree of atrophy. No significant extraaxial fluid collection is present. Matter changes extend into the brainstem. Remote lacunar infarcts are present the posterior cerebellum bilaterally, left prominent than right. White matter changes are present in the thalami bilaterally. The internal auditory canals are within normal limits. Midline structures are within normal limits. Postcontrast images demonstrate no pathologic enhancement. Vascular: Flow is present in the major intracranial arteries. Skull and upper cervical spine: The craniocervical junction is normal. Upper cervical spine  is within normal limits. Marrow signal is  unremarkable. Sinuses/Orbits: Acute on chronic changes are present with a fluid level in the right maxillary sinus and posterior wall osseous thickening. The paranasal sinuses are otherwise clear. Small mastoid effusions are present, right greater than left. No associated enhancement or restricted diffusion is present. The globes and orbits are within normal limits. IMPRESSION: 1. No evidence for metastatic disease to the brain. 2. Moderate generalized atrophy and white matter disease likely reflects the sequela of chronic microvascular ischemia. 3. 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 4. Acute on chronic right maxillary sinus disease. Electronically Signed   By: Marin Roberts M.D.   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  She would be a good candidate for palliative radiation therapy directed at this large lung mass and associated chest wall involvement.  Today, I talked to the patient about the findings and work-up thus far.  We discussed the natural history of non-small cell lung cancer and general treatment, highlighting the role of radiotherapy in the management.  We discussed the available radiation techniques, and focused on the details of logistics and delivery.  We reviewed the anticipated acute and late sequelae associated with radiation in this setting.  The patient was encouraged to ask questions that I answered to the best of my ability.  A patient consent form was discussed and signed.  We retained a copy for our records.  The patient would like to proceed with radiation and will  be scheduled for CT simulation.  PLAN: She will return for CT simulation on May 14 with treatments to begin later that week.  Anticipate 15 treatments.   60 minutes of total time was spent for this patient encounter, including preparation, face-to-face counseling with the patient and coordination of care, physical exam, and documentation of the encounter.   ------------------------------------------------  Billie Lade, PhD, MD  This document serves as a record of services personally performed by Antony Blackbird, MD. It was created on his behalf by Neena Rhymes, a trained medical scribe. The creation of this record is based on the scribe's personal observations and the provider's statements to them. This document has been checked and approved by the attending provider.

## 2022-12-01 ENCOUNTER — Encounter: Payer: Self-pay | Admitting: Radiation Oncology

## 2022-12-01 ENCOUNTER — Other Ambulatory Visit: Payer: Self-pay | Admitting: Internal Medicine

## 2022-12-01 ENCOUNTER — Telehealth: Payer: Self-pay

## 2022-12-01 DIAGNOSIS — I10 Essential (primary) hypertension: Secondary | ICD-10-CM

## 2022-12-01 NOTE — Telephone Encounter (Signed)
RN called to obtain meaningful use and nurse evaluation information. Consult note completed and routed to Dr. Roselind Messier.

## 2022-12-02 ENCOUNTER — Ambulatory Visit
Admission: RE | Admit: 2022-12-02 | Discharge: 2022-12-02 | Disposition: A | Discharge status: Home / Self Care | Payer: 59 | Source: Ambulatory Visit | Attending: Radiation Oncology | Admitting: Radiation Oncology

## 2022-12-02 ENCOUNTER — Ambulatory Visit: Payer: Self-pay

## 2022-12-02 VITALS — BP 112/47 | HR 93 | Temp 96.3°F | Resp 18 | Ht 60.0 in | Wt 140.2 lb

## 2022-12-02 DIAGNOSIS — C3411 Malignant neoplasm of upper lobe, right bronchus or lung: Secondary | ICD-10-CM

## 2022-12-02 DIAGNOSIS — C3491 Malignant neoplasm of unspecified part of right bronchus or lung: Secondary | ICD-10-CM

## 2022-12-02 DIAGNOSIS — Z87891 Personal history of nicotine dependence: Secondary | ICD-10-CM | POA: Diagnosis not present

## 2022-12-02 NOTE — Patient Outreach (Signed)
  Care Coordination   Follow Up Visit Note   12/02/2022 Name: Courtney Brown MRN: [id number] DOB: March 15, 1941  Courtney Brown is a 82 y.o. year old female who sees Courtney Grandchild, MD for primary care. I spoke with  Courtney Brown by phone today.  What matters to the patients health and wellness today?  Continues to attend follow up appointments and appointments to evaluate/treat lung mass. She reports her daughter takes her to her appointments. She reports some SOB and states she also has COPD, but has not required oxygen up to the point. She reports she tires easily and due to COPD can get SOB. Courtney Brown states she has strategically placed furniture in her home, where if she gets tired she can hold onto, sit and rest. She states some days her appetite is good and there are days when her appitite is less. She reports she will drink nutritional supplement to continue to get nutrients. Courtney Brown denies any needs for talk therapy or coping strategy needs. She is without questions or concerns at this time.  Goals Addressed             This Visit's Progress    Care Coordination Activities       Interventions Today    Flowsheet Row Most Recent Value  Chronic Disease   Chronic disease during today's visit Other  [upper right lung mass]  General Interventions   General Interventions Discussed/Reviewed General Interventions Reviewed, Doctor Visits  Doctor Visits Discussed/Reviewed Doctor Visits Discussed, Doctor Visits Reviewed  PCP/Specialist Visits Compliance with follow-up visit  Exercise Interventions   Exercise Discussed/Reviewed Exercise Reviewed  [encouraged to continue to perform exercises provided by Home health PT in the past to maintain muscle strength and tone]  Education Interventions   Education Provided Provided Education  Provided Verbal Education On Other, [employer], Exercise  [discussed oxygen saturation monitoring and encouraged to contact pulmonologist/provider if she  has increase SOB or if she notices oxygen saturation levels decreasing to 88% or less to notify pulmonolgy or provider.]  Mental Health Interventions   Mental Health Discussed/Reviewed Other  [reiterated LCSW available for talk therapy/coping strategies-patient denies need at this time]  Nutrition Interventions   Nutrition Discussed/Reviewed Nutrition Reviewed  Pharmacy Interventions   Pharmacy Dicussed/Reviewed Pharmacy Topics Reviewed  Safety Interventions   Safety Discussed/Reviewed Safety Discussed, Fall Risk  [fall prevention strategies discussed.]            SDOH assessments and interventions completed:  No  Care Coordination Interventions:  Yes, provided   Follow up plan: Follow up call scheduled for 01/02/23    Encounter Outcome:  Pt. Visit Completed   Courtney Sheriff, RN, MSN, BSN, CCM [facility] Care Coordinator [phone]

## 2022-12-02 NOTE — Patient Instructions (Addendum)
Visit Information  Thank you for taking time to visit with me today. Please don't hesitate to contact me if I can be of assistance to you.   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]  General Interventions   General Interventions Discussed/Reviewed General Interventions Reviewed, Doctor Visits  Doctor Visits Discussed/Reviewed Doctor Visits Discussed, Doctor Visits Reviewed  PCP/Specialist Visits Compliance with follow-up visit  Exercise Interventions   Exercise Discussed/Reviewed Exercise Reviewed  [encouraged to continue to perform exercises provided by Home health PT in the past to maintain muscle strength and tone]  Education Interventions   Education Provided Provided Education  Provided Verbal Education On Other, [employer], Exercise  [discussed oxygen saturation monitoring and encouraged to contact pulmonologist/provider if she has increase SOB or if she notices oxygen saturation levels decreasing to 88% or less to notify pulmonolgy or provider.]  Mental Health Interventions   Mental Health Discussed/Reviewed Other  [reiterated LCSW available for talk therapy/coping strategies-patient denies need at this time]  Nutrition Interventions   Nutrition Discussed/Reviewed Nutrition Reviewed  Pharmacy Interventions   Pharmacy Dicussed/Reviewed Pharmacy Topics Reviewed  Safety Interventions   Safety Discussed/Reviewed Safety Discussed, Fall Risk  [fall prevention strategies discussed.]            Our next appointment is by telephone on 01/02/23 at 11:30  Please call the care guide team at [phone] if you need to cancel or reschedule your appointment.   If you are experiencing a Mental Health or Behavioral Health Crisis or need  someone to talk to, please call the Suicide and Crisis Lifeline: 55  [name], RN, MSN, BSN, CCM [facility] Care Coordinator [phone]    Eating Plan for Chronic Obstructive Pulmonary Disease Chronic obstructive pulmonary disease (COPD) causes symptoms such as shortness of breath, coughing, and chest discomfort. These symptoms can make it difficult to eat enough to maintain a healthy weight. Generally, people with COPD should eat a diet that is high in calories, protein, and other nutrients to maintain body weight and to keep the lungs as healthy as possible. Depending on the medicines you take and other health conditions you may have, your health care provider may give you additional recommendations on what to eat or avoid. Talk with your health care provider about your goals for body weight, and work with a dietitian to develop an eating plan that is right for you. What are tips for following this plan? Reading food labels  Avoid foods with more than 300 milligrams (mg) of salt (sodium) per serving. Choose foods that contain at least 4 grams (g) of fiber per serving. Try to eat 20-30 g of fiber each day. Choose foods that are high in calories and protein, such as nuts, beans, yogurt, and cheese. Shopping Do not buy foods labeled as diet, low-calorie, or low-fat. If you are able to eat dairy products: Avoid low-fat or skim milk. Buy dairy products that have at least 2% fat. Buy nutritional supplement drinks. Buy grains and prepared foods labeled as enriched or fortified. Consider buying low-sodium, pre-made foods to conserve energy for eating. Cooking Add dry milk or protein powder to smoothies. Cook with healthy fats, such as olive oil,  canola oil, sunflower oil, and grapeseed oil. Add oil, butter, cream cheese, or nut butters to foods to increase fat and calories. To make foods easier to chew and swallow: Cook vegetables, pasta, and rice until soft. Cut or grind meat into very  small pieces. Dip breads in liquid. Meal planning  Eat when you feel hungry. Eat 5-6 small meals throughout the day. Drink 6-8 glasses of water each day. Do not drink liquids with meals. Drink liquids at the end of the meal to avoid feeling full too quickly. Eat a variety of fruits and vegetables every day. Ask for assistance from family or friends with planning and preparing meals as needed. Avoid foods that cause you to feel bloated, such as carbonated drinks, fried foods, beans, broccoli, cabbage, and apples. For older adults, ask your local agency on aging whether you are eligible for meal assistance programs, such as Meals on Wheels. Lifestyle  Do not smoke. Eat slowly. Take small bites and chew food well before swallowing. Do not overeat. This may make it more difficult to breathe after eating. Sit up while eating. If needed, continue to use supplemental oxygen while eating. Rest or relax for 30 minutes before and after eating. Monitor your weight as told by your health care provider. Exercise as told by your health care provider. What foods should I eat? Fruits All fresh, dried, canned, or frozen fruits that do not cause gas. Vegetables All fresh, canned (no salt added), or frozen vegetables that do not cause gas. Grains Whole-grain bread. Enriched whole-grain pasta. Fortified whole-grain cereals. Fortified rice. Quinoa. Meats and other proteins Lean meat. Poultry. Fish. Dried beans. Unsalted nuts. Tofu. Eggs. Nut butters. Dairy Whole or 2% milk. Cheese. Yogurt. Fats and oils Olive oil. Canola oil. Butter. Margarine. Beverages Water. Vegetable juice (no salt added). Decaffeinated coffee. Decaffeinated or herbal tea. Seasonings and condiments Fresh or dried herbs. Low-salt or salt-free seasonings. Low-sodium soy sauce. The items listed above may not be a complete list of foods and beverages you can eat. Contact a dietitian for more information. What foods should I  avoid? Fruits Fruits that cause gas, such as apples or melon. Vegetables Vegetables that cause gas, such as broccoli, Brussels sprouts, cabbage, cauliflower, and onions. Canned vegetables with added salt. Meats and other proteins Fried meat. Salt-cured meat. Processed meat. Dairy Fat-free or low-fat milk, yogurt, or cheese. Processed cheese. Beverages Carbonated drinks. Caffeinated drinks, such as coffee, tea, and soft drinks. Juice. Alcohol. Vegetable juice with added salt. Seasonings and condiments Salt. Seasoning mixes with salt. Soy sauce. Rosita Fire. Other foods Clear soup or broth. Fried foods. Prepared frozen meals. The items listed above may not be a complete list of foods and beverages you should avoid. Contact a dietitian for more information. Summary COPD symptoms can make it difficult to eat enough to maintain a healthy weight. A COPD eating plan can help you maintain your body weight and keep your lungs as healthy as possible. Eat a diet that is high in calories, protein, and other nutrients. Read labels to make sure that you are getting the right nutrients. Cook foods to make them easier to chew and swallow. Eat 5-6 small meals throughout the day, and avoid foods that cause gas or make you feel bloated. This information is not intended to replace advice given to you by your health care provider. Make sure you discuss any questions you have with your health care provider. Document Revised: 05/22/2020 Document Reviewed: 05/22/2020 Elsevier Patient Education  2023 [employer].

## 2022-12-03 ENCOUNTER — Inpatient Hospital Stay: Payer: 59 | Attending: Oncology | Admitting: Oncology

## 2022-12-03 DIAGNOSIS — G893 Neoplasm related pain (acute) (chronic): Secondary | ICD-10-CM | POA: Diagnosis not present

## 2022-12-03 DIAGNOSIS — Z79899 Other long term (current) drug therapy: Secondary | ICD-10-CM | POA: Diagnosis not present

## 2022-12-03 DIAGNOSIS — D649 Anemia, unspecified: Secondary | ICD-10-CM | POA: Diagnosis not present

## 2022-12-03 DIAGNOSIS — C3411 Malignant neoplasm of upper lobe, right bronchus or lung: Secondary | ICD-10-CM

## 2022-12-03 DIAGNOSIS — C7951 Secondary malignant neoplasm of bone: Secondary | ICD-10-CM | POA: Diagnosis not present

## 2022-12-03 DIAGNOSIS — C3491 Malignant neoplasm of unspecified part of right bronchus or lung: Secondary | ICD-10-CM

## 2022-12-03 DIAGNOSIS — Z7189 Other specified counseling: Secondary | ICD-10-CM

## 2022-12-03 NOTE — Progress Notes (Signed)
[facility] Cancer Follow up Visit:  Patient Care Team: Courtney Grandchild, MD as PCP - General (Internal Medicine) Courtney Gess, MD as Consulting Physician (Cardiology) Courtney Muse, MD as Attending Physician (Hematology) Courtney Maryland, RN as [facility] Care Management  CHIEF COMPLAINTS/PURPOSE OF CONSULTATION:  Oncology History  Squamous cell lung cancer (HCC)  11/04/2022 Initial Diagnosis   Squamous cell lung cancer (HCC)   11/21/2022 Cancer Staging   Staging form: Lung, AJCC 8th Edition - Clinical stage from 11/21/2022: Stage IVA (cT4, cN2, cM1a) - Signed by Courtney Muse, MD on 12/03/2022 Histopathologic type: Squamous cell carcinoma, NOS Stage prefix: Initial diagnosis Histologic grade (G): G3 Histologic grading system: 4 grade system     HISTORY OF PRESENTING ILLNESS: Courtney Brown 82 y.o. female is here because of anemia Medical history notable for arthritis, history of melanoma, colon polyps, COPD, coronary artery disease, GERD, osteoporosis, hypertension, CVA, acute kidney injury Colonoscopy done 05/26/2014 by [facility] in [place], [place]: colon polyps removed. Recommended to repeat in 5years. EGD done 05/26/2014 by [facility]: mild gastritis.   February 26, 2022 ferritin 5.4 B12 163 zinc 51  June 04, 2022 WBC 14.7 hemoglobin 10.9 MCV 76 platelet count 637; 76 segs 10 lymphs 11 monos 2 eos 1 basophil. Ferritin 7.1  July 04 2022:  [facility] Hematology Consult In August 2023 patient was placed on oral iron, zinc, B12 injections.  She stopped taking oral iron for a time because it was costing too much.  She is now taking low dose oral iron with B12 and folic acid as well as zinc.  She takes the supplements bid on an empty stomach.  Has some diarrhea which may be related to her medications.  Received PRBC's in 2015 in the setting of sepsis.  No history of IV iron.  No hematochezia, melena, BRBPR.   Stools dark from oral iron.  Has not had endoscopy since 2015.  Takes ibuprofen for pain and is on ASA 81 mg daily  Social:  Widowed.  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  Quit smoking 16 yrs ago.  EtOH very seldom.    [facility] Mother died 100 fall Father died 16's dementia, parkinsons Brother x 3.  All have had Parkinson's disease.  One alive.  Two died;  one had prostate cancer      WBC 15.1 hemoglobin 10.7 MCV 79 platelet count 650; 79 segs 8 lymphs 10 monos eos cell count 1.7%  Morphology notable for polychromasia Coombs negative haptoglobin 319 SPEP with IEP showed no paraprotein free kappa 70.7 lambda 52.3 with a kappa lambda 1.35 IgG 1089 IgA 370 IgM 90 Ferritin 115 Copper 111 B12 813 zinc 94 CMP notable for albumin 2.9 AST 13  July 11, 2022: Feraheme 510 mg IV  August 04, 2022: Patient message from [facility] to schedule an appointment  August 15, 2022.  Scheduled follow-up for management of anemia.  She is taking Nature's bounty gentle iron 28 mg daily which she is tolerating well. Patient will schedule GI appointment once daughter recovers from her dental surgery.     WBC 17.3 hemoglobin 11.2 MCV 81 platelet count 604; 79 segs 9 lymphs 10 monos 1 EO Ferritin 391 CMP notable for albumin 2.9  Calcium 10.5  Patient can take B12 by mouth  October 23, 2022: Presented to [facility] health ED with several month history of right upper chest and shoulder discomfort Chest x-ray showed large rounded mass in the right  upper lobe.  CTPA demonstrated small right paratracheal and subcarinal lymph nodes.  No left hilar adenopathy.  Diffuse emphysematous changes.  Small right pleural effusion.  Large geographic area of soft tissue density in right upper lobe with mass effect.  Majority of right upper lobe bronchial tissues are occluded related to a central hilar mass.  Bony metastatic disease within the right fourth rib.  Spiculated lesions in left upper lobe which may represent  metastasis  Patient was treated for postobstructive pneumonia and ultimately discharged home  October 27, 2022: Bronchoscopy-exophytic mass emanating from anterior and posterior segment total airways of right upper lobe bronchus Pathology demonstrated poorly differentiated squamous cell carcinoma  November 05, 2022: MRI of brain negative for metastasis.  Moderate generalized atrophy and white matter disease likely from chronic microvascular ischemia.  Remote lacunar infarcts  November 18, 2022: PET/CT-centrally necrotic mass within right upper lobe measuring 10.7 x 7.9 cm (SUV 29.4).  Chest wall invasion with tumor extending into right fourth and fifth rib interspace.  Tumor extends into right hilum with encasement of right upper lobe and right middle lobe bronchus.  Several tracer avid nodules in left lung largest is in anterior left upper lobe measuring 1.2 cm (SUV 6.9) tracer avid right paratracheal lymph node.  Right adrenal nodule stable from prior imaging in July 2022.  Increased radiotracer uptake localized to distal right clavicle (SUV 3.18) with corresponding sclerotic changes on CT.  November 21 2022:  Scheduled follow up for newly diagnosed lung cancer.  Discussed results of labs and imaging with patient and daughter Here with daughter Courtney Brown, with whom she lives.   Taking Hydrocodone/APAP 5/325 mg q 6 hrs PRN for the chest wall pain but finds that it barely lasts through that time.  Discussed use of MSIR but would prefer to avoid it at this time  Will refer to Radiation Oncology for consideration of palliative XRT to RUL Guardant 360 ordered Refer for port placement  Dec 02 2022:  Radiation Oncology Consult   Dec 03 2022:  Scheduled follow up for newly diagnosed lung cancer.  Has not been scheduled to see surgery. Just started taking Morphine yesterday; has taken 3 doses with good pain relief.    Dec 09 2022:  For radiation simulation  Review of Systems  Constitutional:  Negative for  appetite change, chills, fatigue, fever and unexpected weight change.  HENT:   Positive for hearing loss. Negative for mouth sores, nosebleeds, sore throat, trouble swallowing and voice change.   Eyes:  Positive for eye problems. Negative for icterus.       Has cataracts which interfere with vision as a result uses a walker  Respiratory:  Positive for shortness of breath. Negative for chest tightness, cough, hemoptysis and wheezing.        PND:  none Orthopnea:  none DOE:    Cardiovascular:  Negative for chest pain, leg swelling and palpitations.       PND:  none Orthopnea:  none  Gastrointestinal:  Negative for abdominal pain, blood in stool, constipation, diarrhea, nausea and vomiting.  Endocrine: Negative for hot flashes.       Cold intolerance:  none Heat intolerance:  none  Genitourinary:  Negative for bladder incontinence, difficulty urinating, dysuria, frequency, hematuria and nocturia.   Musculoskeletal:  Positive for arthralgias, back pain and gait problem. Negative for myalgias, neck pain and neck stiffness.       Has chronic back pain  Skin:  Negative for itching, rash and wound.  Neurological:  Positive for extremity weakness and gait problem. Negative for dizziness, headaches, light-headedness, numbness, seizures and speech difficulty.  Hematological:  Negative for adenopathy. Bruises/bleeds easily.  Psychiatric/Behavioral:  Negative for sleep disturbance and suicidal ideas. The patient is not nervous/anxious.     MEDICAL HISTORY: Past Medical History:  Diagnosis Date   Allergy    Anemia    Anxiety    Arthritis    Cancer (HCC)    hx of melanoma    Colon polyps    COPD (chronic obstructive pulmonary disease) (HCC)    Coronary artery disease    Dyspnea    with exertion    Edema    in lower extremties    Emphysema of lung (HCC)    GERD (gastroesophageal reflux disease)    H/O hiatal hernia    Hyperlipidemia    Hypertension    Inverted nipple    bilateral, pt  says that's normal   Lung cancer (HCC) 10/23/2022   Malignant melanoma (HCC) 1983   again in 1995]   Myocardial infarct, old 2008   no stent placed   Osteoporosis    Pneumonia    hx of    Sepsis (HCC)    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    Stroke [facility])    TIA    SURGICAL HISTORY: Past Surgical History:  Procedure Laterality Date   ABDOMINAL HYSTERECTOMY  1998   BRONCHIAL BIOPSY  10/27/2022   Procedure: BRONCHIAL BIOPSIES;  Surgeon: Leslye Peer, MD;  Location: [facility] ENDOSCOPY;  Service: Cardiopulmonary;;   BRONCHIAL BRUSHINGS  10/27/2022   Procedure: BRONCHIAL BRUSHINGS;  Surgeon: Leslye Peer, MD;  Location: [facility] ENDOSCOPY;  Service: Cardiopulmonary;;   CHOLECYSTECTOMY  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   HEMOSTASIS CONTROL  10/27/2022   Procedure: HEMOSTASIS CONTROL;  Surgeon: Leslye Peer, MD;  Location: [facility] ENDOSCOPY;  Service: Cardiopulmonary;;   HERNIA REPAIR     umbilical, ventral   INCISIONAL HERNIA REPAIR N/A 10/26/2020   Procedure: DIAGNOSTIC LAPAROSCOPY MESH EXPLANTATION; SMALL BOWEL RESECTION; PRIMARY REPAIR OF INCISIONAL HERNIA;  Surgeon: Sheliah Hatch De Blanch, MD;  Location: [facility] ORS;  Service: General;  Laterality: N/A;   INSERTION OF ILIAC STENT  2008   PORTACATH PLACEMENT N/A 12/08/2022   Procedure: PORT-A-CATH PLACEMENT WITH ULTRASOUND GUIDANCE;  Surgeon: Rodman Pickle, MD;  Location: [facility] ORS;  Service: General;  Laterality: N/A;   VIDEO BRONCHOSCOPY Right 10/27/2022   Procedure: VIDEO BRONCHOSCOPY WITHOUT FLUORO;  Surgeon: Leslye Peer, MD;  Location: [facility] ENDOSCOPY;  Service: Cardiopulmonary;  Laterality: Right;    SOCIAL HISTORY: Social History   Socioeconomic History   Marital status: Widowed    Spouse name: Not on file   Number of children: 3   Years of education: Not on file   Highest education level: 12th grade  Occupational History   Not on file  Tobacco Use   Smoking status: Former     Packs/day: 1.50    Years: 44.00    Additional pack years: 0.00    Total pack years: 66.00    Types: Cigarettes    Quit date: 07/28/2005    Years since quitting: 17.3   Smokeless tobacco: Never  Vaping Use   Vaping Use: Never used  Substance and Sexual Activity   Alcohol use: Not Currently    Alcohol/week: 2.0 standard drinks of alcohol    Types: 2 Shots of liquor per week   Drug use: No  Sexual activity: Not Currently  Other Topics Concern   Not on file  Social History Narrative   Right handed   Caffeine 64 0z a day   Home is one story    Lives with daughter   Retired.   Social Determinants of Health   Financial Resource Strain: Low Risk  (11/01/2022)   Overall Financial Resource Strain (CARDIA)    Difficulty of Paying Living Expenses: Not hard at all  Food Insecurity: No Food Insecurity (12/06/2022)   Hunger Vital Sign    Worried About Running Out of Food in the Last Year: Never true    Ran Out of Food in the Last Year: Never true  Transportation Needs: No Transportation Needs (12/06/2022)   PRAPARE - Administrator, Civil Service (Medical): No    Lack of Transportation (Non-Medical): No  Physical Activity: Sufficiently Active (11/01/2022)   Exercise Vital Sign    Days of Exercise per Week: 5 days    Minutes of Exercise per Session: 30 min  Stress: No Stress Concern Present (11/01/2022)   [employer] of Occupational Health - Occupational Stress Questionnaire    Feeling of Stress : Not at all  Social Connections: Unknown (11/01/2022)   Social Connection and Isolation Panel [NHANES]    Frequency of Communication with Friends and Family: More than three times a week    Frequency of Social Gatherings with Friends and Family: Patient declined    Attends Religious Services: Patient declined    Database administrator or Organizations: No    Attends Engineer, structural: Not on file    Marital Status: Widowed  Intimate Partner Violence: Not At Risk  (12/06/2022)   Humiliation, Afraid, Rape, and Kick questionnaire    Fear of Current or Ex-Partner: No    Emotionally Abused: No    Physically Abused: No    Sexually Abused: No    FAMILY HISTORY Family History  Problem Relation Age of Onset   Arthritis Mother    Hearing loss Father    Colon cancer Brother    Hearing loss Maternal Grandmother    Cancer Maternal Grandmother        colon   Cancer Paternal Grandmother        colon   Hearing loss Maternal Aunt    Breast cancer Neg Hx     ALLERGIES:  is allergic to prednisone and statins.  MEDICATIONS:  No current facility-administered medications for this visit.   No current outpatient medications on file.   Facility-Administered Medications Ordered in Other Visits  Medication Dose Route Frequency Provider Last Rate Last Admin   acetaminophen (TYLENOL) tablet 650 mg  650 mg Oral Q6H PRN Kinsinger, De Blanch, MD       Or   acetaminophen (TYLENOL) suppository 650 mg  650 mg Rectal Q6H PRN Kinsinger, De Blanch, MD       acetaminophen (TYLENOL) tablet 1,000 mg  1,000 mg Oral Q8H Kinsinger, De Blanch, MD   1,000 mg at 12/10/22 0534   albuterol (PROVENTIL) (2.5 MG/3ML) 0.083% nebulizer solution 3 mL  3 mL Inhalation Q4H PRN Dorcas Carrow, MD   3 mL at 12/10/22 0729   ALPRAZolam (XANAX) tablet 0.25 mg  0.25 mg Oral QHS Kinsinger, De Blanch, MD   0.25 mg at 12/09/22 2127   heparin injection 5,000 Units  5,000 Units Subcutaneous Q8H Kinsinger, De Blanch, MD   5,000 Units at 12/10/22 0534   HYDROmorphone (DILAUDID) injection 1 mg  1 mg Intravenous  Q2H PRN Dorcas Carrow, MD   1 mg at 12/08/22 1508   lactated ringers infusion   Intravenous Continuous Kinsinger, De Blanch, MD 10 mL/hr at 12/08/22 1147 Restarted at 12/08/22 1211   lidocaine (LIDODERM) 5 % 1 patch  1 patch Transdermal Q24H Kinsinger, De Blanch, MD   1 patch at 12/09/22 1653   lip balm (CARMEX) ointment   Topical PRN Kinsinger, De Blanch, MD   75 Application at 12/07/22  1808   melatonin tablet 10 mg  10 mg Oral QHS PRN Kinsinger, De Blanch, MD   10 mg at 12/07/22 2113   metoprolol succinate (TOPROL-XL) 24 hr tablet 50 mg  50 mg Oral Daily Kinsinger, De Blanch, MD   50 mg at 12/10/22 0930   mometasone-formoterol (DULERA) 200-5 MCG/ACT inhaler 2 puff  2 puff Inhalation BID Kinsinger, De Blanch, MD   2 puff at 12/10/22 0730   morphine (MS CONTIN) 12 hr tablet 15 mg  15 mg Oral Q12H Kinsinger, De Blanch, MD   15 mg at 12/10/22 0930   nystatin (MYCOSTATIN) 100000 UNIT/ML suspension 500,000 Units  5 mL Mouth/Throat QID Kinsinger, De Blanch, MD   500,000 Units at 12/10/22 0930   ondansetron (ZOFRAN) tablet 4 mg  4 mg Oral Q6H PRN Kinsinger, De Blanch, MD   4 mg at 12/06/22 1514   Or   ondansetron (ZOFRAN) injection 4 mg  4 mg Intravenous Q6H PRN Kinsinger, De Blanch, MD   4 mg at 12/09/22 1821   oxyCODONE (Oxy IR/ROXICODONE) immediate release tablet 5 mg  5 mg Oral Q4H PRN Rosalin Hawking, MD   5 mg at 12/10/22 0836   pantoprazole (PROTONIX) EC tablet 40 mg  40 mg Oral Daily Kinsinger, De Blanch, MD   40 mg at 12/10/22 0930   piperacillin-tazobactam (ZOSYN) IVPB 3.375 g  3.375 g Intravenous Q8H Dorcas Carrow, MD   Stopped at 12/10/22 0936   polyethylene glycol (MIRALAX / GLYCOLAX) packet 17 g  17 g Oral BID Rosalin Hawking, MD       primidone (MYSOLINE) tablet 50 mg  50 mg Oral QHS Kinsinger, De Blanch, MD   50 mg at 12/09/22 2129   [START ON 12/11/2022] senna (SENOKOT) tablet 17.2 mg  2 tablet Oral Daily Rosalin Hawking, MD        PHYSICAL EXAMINATION:  ECOG PERFORMANCE STATUS: 1 - Symptomatic but completely ambulatory   There were no vitals filed for this visit.   There were no vitals filed for this visit.    Physical Exam  Physical exam could not be conducted as part of a telephone visit  LABORATORY DATA: I have personally reviewed the data as listed:  Office Visit on 11/21/2022  Component Date Value Ref Range Status   WBC 11/21/2022 17.8 (H)  4.0 -  10.5 K/uL Final   RBC 11/21/2022 4.07  3.87 - 5.11 MIL/uL Final   Hemoglobin 11/21/2022 10.1 (L)  12.0 - 15.0 g/dL Final   HCT 16/04/9603 33.6 (L)  36.0 - 46.0 % Final   MCV 11/21/2022 82.6  80.0 - 100.0 fL Final   MCH 11/21/2022 24.8 (L)  26.0 - 34.0 pg Final   MCHC 11/21/2022 30.1  30.0 - 36.0 g/dL Final   RDW 54/03/8118 16.4 (H)  11.5 - 15.5 % Final   Platelets 11/21/2022 767 (H)  150 - 400 K/uL Final   nRBC 11/21/2022 0.0  0.0 - 0.2 % Final   Neutrophils Relative % 11/21/2022 82  % Final   Neutro  Abs 11/21/2022 14.5 (H)  1.7 - 7.7 K/uL Final   Lymphocytes Relative 11/21/2022 7  % Final   Lymphs Abs 11/21/2022 1.3  0.7 - 4.0 K/uL Final   Monocytes Relative 11/21/2022 9  % Final   Monocytes Absolute 11/21/2022 1.7 (H)  0.1 - 1.0 K/uL Final   Eosinophils Relative 11/21/2022 1  % Final   Eosinophils Absolute 11/21/2022 0.2  0.0 - 0.5 K/uL Final   Basophils Relative 11/21/2022 0  % Final   Basophils Absolute 11/21/2022 0.0  0.0 - 0.1 K/uL Final   Immature Granulocytes 11/21/2022 1  % Final   Abs Immature Granulocytes 11/21/2022 0.10 (H)  0.00 - 0.07 K/uL Final   Performed at [facility], [address], [place], [place] [address]   Ferritin 11/21/2022 336 (H)  11 - 307 ng/mL Final   Performed at [facility], [address], [place], [place] [address]  Hospital Outpatient Visit on 11/18/2022  Component Date Value Ref Range Status   Glucose-Capillary 11/18/2022 97  70 - 99 mg/dL Final   Glucose reference range applies only to samples taken after fasting for at least 8 hours.  Appointment on 11/04/2022  Component Date Value Ref Range Status   Sodium 11/04/2022 135  135 - 145 mmol/L Final   Potassium 11/04/2022 4.2  3.5 - 5.1 mmol/L Final   Chloride 11/04/2022 100  98 - 111 mmol/L Final   CO2 11/04/2022 29  22 - 32 mmol/L Final   Glucose, Bld 11/04/2022 115 (H)  70 - 99 mg/dL Final   Glucose reference range applies only to samples taken  after fasting for at least 8 hours.   BUN 11/04/2022 15  8 - 23 mg/dL Final   Creatinine 09/81/1914 0.77  0.44 - 1.00 mg/dL Final   Calcium 78/29/5621 11.0 (H)  8.9 - 10.3 mg/dL Final   Total Protein 30/86/5784 6.9  6.5 - 8.1 g/dL Final   Albumin 69/62/9528 2.9 (L)  3.5 - 5.0 g/dL Final   AST 41/32/4401 13 (L)  15 - 41 U/L Final   ALT 11/04/2022 6  0 - 44 U/L Final   Alkaline Phosphatase 11/04/2022 61  38 - 126 U/L Final   Total Bilirubin 11/04/2022 0.4  0.3 - 1.2 mg/dL Final   GFR, Estimated 11/04/2022 >60  >60 mL/min Final   Comment: (NOTE) Calculated using the CKD-EPI Creatinine Equation (2021)    Anion gap 11/04/2022 6  5 - 15 Final   Performed at [facility], [address], [place], [place] [address]   WBC Count 11/04/2022 15.8 (H)  4.0 - 10.5 K/uL Final   RBC 11/04/2022 4.04  3.87 - 5.11 MIL/uL Final   Hemoglobin 11/04/2022 10.4 (L)  12.0 - 15.0 g/dL Final   HCT 36/64/4034 33.6 (L)  36.0 - 46.0 % Final   MCV 11/04/2022 83.2  80.0 - 100.0 fL Final   MCH 11/04/2022 25.7 (L)  26.0 - 34.0 pg Final   MCHC 11/04/2022 31.0  30.0 - 36.0 g/dL Final   RDW 74/25/9563 15.9 (H)  11.5 - 15.5 % Final   Platelet Count 11/04/2022 705 (H)  150 - 400 K/uL Final   nRBC 11/04/2022 0.0  0.0 - 0.2 % Final   Neutrophils Relative % 11/04/2022 73  % Final   Neutro Abs 11/04/2022 11.5 (H)  1.7 - 7.7 K/uL Final   Lymphocytes Relative 11/04/2022 12  % Final   Lymphs Abs 11/04/2022 1.9  0.7 -  4.0 K/uL Final   Monocytes Relative 11/04/2022 12  % Final   Monocytes Absolute 11/04/2022 1.8 (H)  0.1 - 1.0 K/uL Final   Eosinophils Relative 11/04/2022 2  % Final   Eosinophils Absolute 11/04/2022 0.4  0.0 - 0.5 K/uL Final   Basophils Relative 11/04/2022 0  % Final   Basophils Absolute 11/04/2022 0.1  0.0 - 0.1 K/uL Final   Immature Granulocytes 11/04/2022 1  % Final   Abs Immature Granulocytes 11/04/2022 0.13 (H)  0.00 - 0.07 K/uL Final   Performed at [facility], [address], [place], [place] [address]    RADIOGRAPHIC STUDIES: I have personally reviewed the radiological images as listed and agree with the findings in the report  CT ABDOMEN PELVIS W CONTRAST  Result Date: 12/08/2022 CLINICAL DATA:  Severe acute epigastric abdominal pain. History of lung carcinoma. EXAM: CT ABDOMEN AND PELVIS WITH CONTRAST TECHNIQUE: Multidetector CT imaging of the abdomen and pelvis was performed using the standard protocol following bolus administration of intravenous contrast. RADIATION DOSE REDUCTION: This exam was performed according to the departmental dose-optimization program which includes automated exposure control, adjustment of the mA and/or kV according to patient size and/or use of iterative reconstruction technique. CONTRAST:  OMNIPAQUE IOHEXOL 300 MG/ML  SOLN COMPARISON:  CT of the chest, abdomen and pelvis on 12/05/2022 FINDINGS: Lower chest: Stable small right pleural effusion and trace left pleural fluid. Hepatobiliary: No focal liver abnormality is seen. Status post cholecystectomy. No biliary dilatation. Pancreas: New acute pancreatitis since the prior scan with peripancreatic inflammation and edema surrounding the entire pancreas. No evidence of pancreatic necrosis, pseudocyst or pancreatic ductal dilatation. No associated pancreatic masses. Spleen: Normal in size without focal abnormality. Adrenals/Urinary Tract: Adrenal glands are unremarkable. Stable appearance of kidneys including bilateral cysts and a probable hemorrhagic cyst of the left upper pole. Bladder is unremarkable. Stomach/Bowel: No bowel obstruction or free intraperitoneal air. Potential mild ileus involving the ascending and transverse colon. Vascular/Lymphatic: Stable atherosclerosis of the abdominal aorta and iliac arteries without aneurysm. Reproductive: Status post hysterectomy. No adnexal masses. Other: New free fluid in the pelvis.  No discrete abscess. Musculoskeletal:  No acute or significant osseous findings. IMPRESSION: 1. New acute pancreatitis with peripancreatic inflammation and edema surrounding the entire pancreas. No evidence of pancreatic necrosis, pseudocyst or pancreatic ductal dilatation. 2. New free fluid in the pelvis. 3. Potential mild ileus involving the ascending and transverse colon. 4. Stable small right pleural effusion and trace left pleural fluid. 5. Stable atherosclerosis of the abdominal aorta and iliac arteries. Aortic Atherosclerosis (ICD10-I70.0). Electronically Signed   By: Irish Lack M.D.   On: 12/08/2022 18:01   DG C-Arm 1-60 Min-No Report  Result Date: 12/08/2022 Fluoroscopy was utilized by the requesting physician.  No radiographic interpretation.    ASSESSMENT/PLAN  82 y.o. female with medical history notable for arthritis, history of melanoma, colon polyps, COPD, coronary artery disease, GERD, osteoporosis, hypertension, CVA, acute kidney injury.  Patient seen initially for evaluation and management of anemia who was subsequently diagnosed with squamous cell carcinoma of the lung   Squamous cell carcinoma of right lung, Stage IV (T4 N2 M1)             October 23 2022- CT PA showed large RUL mass encompassing several ribs.  Spiculated lesions in LUL             November 05 2022- MRI brain negative for metastasis November 18 2022- PET/CT  Necrotic RUL mass  10.7 x 7.9 cm (SUV 29.4) with invasion into right fourth and fifth rib interspace. Tumor extends into right hilum with encasement of RUL and RML bronchus. Tracer avid nodules in left lung  and right paratracheal lymph node. Increased radiotracer uptake localized to distal right clavicle (SUV 3.18) with sclerotic changes on CT.  November 21 2022- Sent [facility]le Nordmann 161   Therapeutics:  November 21 2022- Patient has advanced lung cancer which can not be cured.  Goal of therapy is palliation.  Her respiratory status is at risk owing to her underlying lung disease and the geography/large volume of  her tumor.  Recommend radiation oncology consult for consideration of therapy for local control.  Following this would consider palliative systemic therapy.   Dec 02 2022- Radiation Oncology Consult Dec 03 2022- Discussed weekly carboplatin AUC 2 with XRT for radiosensitization.   May 14 Radiation Simulation   Poor venous access:  In preparation for chemotherapy we will arrange for port-a-cath placement.  We discussed benefits (more efficient access to administer chemotherapy and supportive care medications/IVF, imaging contrast, decreased risk of tissue damage from medications) and disadvantages (risks of infections, thrombosis, surgical procedure, periodic flushing).  Patient has elected to proceed with port placement.       Cancer related pain November 21 2022- Currently taking Hydrocodone/APAP 5/325 mg q 6 hrs PRN with incomplete relief.  Recommend MSIR 7.5 mg po q 4 hrs PRN.  Patient hesitant to use morphine.  Wrote for Hydrocodone/APAP 7.5/300 instead.  Chest wall pain should improve with XRT.    Dec 03 2022- Pain improved with MSIR 7.5 mg po q 4  hrs PRN.  Will consider addition of MS Contin in the future      Anemia:  Likely multifactorial with major component being chronic GI blood loss.  Contributing factors are likely CKD, B12 deficiency.  Patient on chronic antiplatelet therapy with ASA.  Has history of colon polyps.  Will arrange for IV iron.  Currently receiving B12 injections July 11, 2022: Feraheme 510 mg IV  August 15 2022:  Hgb improved to 11.2.  Patient to schedule GI appointment.  May take oral B12 and continue folic acid  Hypercalcemia:  No evidence of Myeloma.  Patient is taking Calcium with Vitamin D which may be the culprit.     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   No orders of the defined types were placed in this encounter.  60  minutes was spent in patient care.  This included time spent preparing to see the patient (e.g., review of tests), obtaining and/or reviewing separately obtained history, counseling and educating the patient/family/caregiver, ordering medications, tests, or procedures; documenting clinical information in the electronic or other health record, independently interpreting results and communicating results to the patient/family/caregiver as well as coordination of care.      All questions were answered. The patient knows to call the clinic with any problems, questions or concerns.  This note was electronically signed.    Courtney Muse, MD  12/10/2022 1:07 PM

## 2022-12-04 ENCOUNTER — Encounter (HOSPITAL_COMMUNITY): Payer: Self-pay

## 2022-12-04 ENCOUNTER — Telehealth: Payer: Self-pay | Admitting: Nurse Practitioner

## 2022-12-04 ENCOUNTER — Encounter (HOSPITAL_COMMUNITY): Payer: Self-pay | Admitting: General Surgery

## 2022-12-04 ENCOUNTER — Other Ambulatory Visit: Payer: Self-pay

## 2022-12-04 NOTE — Progress Notes (Signed)
Surgery orders requested via Epic inbox. °

## 2022-12-04 NOTE — Patient Instructions (Addendum)
SURGICAL WAITING ROOM VISITATION Patients having surgery or a procedure may have no more than 2 support people in the waiting area - these visitors may rotate.    Children under the age of 23 must have an adult with them who is not the patient.  If the patient needs to stay at the hospital during part of their recovery, the visitor guidelines for inpatient rooms apply. Pre-op nurse will coordinate an appropriate time for 1 support person to accompany patient in pre-op.  This support person may not rotate.    Please refer to the [facility] website for the visitor guidelines for Inpatients (after your surgery is over and you are in a regular room).       Your procedure is scheduled on: 12-08-22   Report to [facility] Main Entrance    Report to admitting at 12:35 PM   Call this number if you have problems the morning of surgery [phone]   Do not eat food :After Midnight.   After Midnight you may have the following liquids until 11:30 AM DAY OF SURGERY  Water Non-Citrus Juices (without pulp, NO RED-Apple, White grape, White cranberry) Black Coffee (NO MILK/CREAM OR CREAMERS, sugar ok)  Clear Tea (NO MILK/CREAM OR CREAMERS, sugar ok) regular and decaf                             Plain Jell-O (NO RED)                                           Fruit ices (not with fruit pulp, NO RED)                                     Popsicles (NO RED)                                                               Sports drinks like Gatorade (NO RED)                       If you have questions, please contact your surgeon's office.   FOLLOW ANY ADDITIONAL PRE OP INSTRUCTIONS YOU RECEIVED FROM YOUR SURGEON'S OFFICE!!!     Oral Hygiene is also important to reduce your risk of infection.                                    Remember - BRUSH YOUR TEETH THE MORNING OF SURGERY WITH YOUR REGULAR TOOTHPASTE   Do NOT smoke after Midnight   Take these medicines the morning of surgery with A  SIP OF WATER:   Colesevelam Fenofibrate Gabapentin Omeprazole Claritin Metoprolol Morphine if needed Zofran if needed Okay to use inhalers and nasal spray                              You may not have any metal on your body including hair pins,  jewelry, and body piercing             Do not wear make-up, lotions, powders, perfumes or deodorant  Do not wear nail polish including gel and S&S, artificial/acrylic nails, or any other type of covering on natural nails including finger and toenails. If you have artificial nails, gel coating, etc. that needs to be removed by a nail salon please have this removed prior to surgery or surgery may need to be canceled/ delayed if the surgeon/ anesthesia feels like they are unable to be safely monitored.   Do not shave  48 hours prior to surgery.          Do not bring valuables to the hospital. [facility] IS NOT RESPONSIBLE   FOR VALUABLES.   Contacts, dentures or bridgework may not be worn into surgery.  DO NOT BRING YOUR HOME MEDICATIONS TO THE HOSPITAL. PHARMACY WILL DISPENSE MEDICATIONS LISTED ON YOUR MEDICATION LIST TO YOU DURING YOUR ADMISSION IN THE HOSPITAL!    Patients discharged on the day of surgery will not be allowed to drive home.  Someone NEEDS to stay with you for the first 24 hours after anesthesia.              Please read over the following fact sheets you were given: IF YOU HAVE QUESTIONS ABOUT YOUR PRE-OP INSTRUCTIONS PLEASE CALL [phone] [facility]   If you received a COVID test during your pre-op visit  it is requested that you wear a mask when out in public, stay away from anyone that may not be feeling well and notify your surgeon if you develop symptoms. If you test positive for Covid or have been in contact with anyone that has tested positive in the last 10 days please notify you surgeon.  [place] - Preparing for Surgery (Use an antibacterial soap if you do not have CHG soap)  Before surgery, you can play an  important role.  Because skin is not sterile, your skin needs to be as free of germs as possible.  You can reduce the number of germs on your skin by washing with CHG (chlorahexidine gluconate) soap before surgery.  CHG is an antiseptic cleaner which kills germs and bonds with the skin to continue killing germs even after washing. Please DO NOT use if you have an allergy to CHG or antibacterial soaps.  If your skin becomes reddened/irritated stop using the CHG and inform your nurse when you arrive at Short Stay. Do not shave (including legs and underarms) for at least 48 hours prior to the first CHG shower.  You may shave your face/neck.  Please follow these instructions carefully:  1.  Shower with CHG Soap the night before surgery and the  morning of surgery.  2.  If you choose to wash your hair, wash your hair first as usual with your normal  shampoo.  3.  After you shampoo, rinse your hair and body thoroughly to remove the shampoo.                             4.  Use CHG as you would any other liquid soap.  You can apply chg directly to the skin and wash.  Gently with a scrungie or clean washcloth.  5.  Apply the CHG Soap to your body ONLY FROM THE NECK DOWN.   Do   not use on face/ open  Wound or open sores. Avoid contact with eyes, ears mouth and   genitals (private parts).                       Wash face,  Genitals (private parts) with your normal soap.             6.  Wash thoroughly, paying special attention to the area where your    surgery  will be performed.  7.  Thoroughly rinse your body with warm water from the neck down.  8.  DO NOT shower/wash with your normal soap after using and rinsing off the CHG Soap.                9.  Pat yourself dry with a clean towel.            10.  Wear clean pajamas.            11.  Place clean sheets on your bed the night of your first shower and do not  sleep with pets. Day of Surgery : Do not apply any lotions/deodorants the  morning of surgery.  Please wear clean clothes to the hospital/surgery center.  FAILURE TO FOLLOW THESE INSTRUCTIONS MAY RESULT IN THE CANCELLATION OF YOUR SURGERY  PATIENT SIGNATURE_________________________________  NURSE SIGNATURE__________________________________  ________________________________________________________________________

## 2022-12-04 NOTE — Progress Notes (Signed)
COVID Vaccine Completed:  No  Date of COVID positive in last 90 days:  No  PCP - Sanda Linger, MD Cardiologist - Nanetta Batty, MD Pulmonologist - [name], MD  Chest x-ray - 10-23-22 Epic, CT chest 10-23-22 Epic EKG - 10-24-22 Epic Stress Test - Yes in [place] ECHO - 02-10-20 Epic Cardiac Cath - 2008 in [place] Pacemaker/ICD device last checked: Spinal Cord Stimulator:  N/A  Bowel Prep - N/A  Sleep Study - N/A CPAP -   Fasting Blood Sugar - N/A Checks Blood Sugar _____ times a day  Last dose of GLP1 agonist-  N/A GLP1 instructions:  N/A   Last dose of SGLT-2 inhibitors-  N/A SGLT-2 instructions: N/A  Blood Thinner Instructions:  N/A Aspirin Instructions: Last Dose:  Activity level:  Unable to climb stairs due to weakness from anemia and shortness of breath.  Able to perform ADLs without  symptoms of chest pain.  Patient has shortness of breath with exertion due to COPD.  This has not worsened and is managed with inhalers.  Anesthesia review:  CAD with hx of MI,  PVD, COPD, HTN, CKD, hx of TIA.  Abnormal R wave progression on EKG  Patient denies shortness of breath, fever, cough and chest pain at PAT appointment (completed over the phone)  Patient verbalized understanding of instructions that were given to them at the PAT appointment. Patient was also instructed that they will need to review over the PAT instructions again at home before surgery.

## 2022-12-05 ENCOUNTER — Ambulatory Visit: Payer: 59 | Admitting: Oncology

## 2022-12-05 ENCOUNTER — Emergency Department (HOSPITAL_COMMUNITY): Payer: 59

## 2022-12-05 ENCOUNTER — Telehealth: Payer: Self-pay

## 2022-12-05 ENCOUNTER — Inpatient Hospital Stay (HOSPITAL_COMMUNITY)
Admission: EM | Admit: 2022-12-05 | Discharge: 2022-12-24 | DRG: 166 | Disposition: A | Discharge status: Skilled Nursing Facility | Payer: 59 | Attending: Internal Medicine | Admitting: Internal Medicine

## 2022-12-05 ENCOUNTER — Encounter (HOSPITAL_COMMUNITY): Payer: Self-pay

## 2022-12-05 ENCOUNTER — Other Ambulatory Visit: Payer: Self-pay

## 2022-12-05 DIAGNOSIS — Z7189 Other specified counseling: Secondary | ICD-10-CM

## 2022-12-05 DIAGNOSIS — Z79899 Other long term (current) drug therapy: Secondary | ICD-10-CM

## 2022-12-05 DIAGNOSIS — I252 Old myocardial infarction: Secondary | ICD-10-CM | POA: Diagnosis not present

## 2022-12-05 DIAGNOSIS — E440 Moderate protein-calorie malnutrition: Secondary | ICD-10-CM | POA: Diagnosis not present

## 2022-12-05 DIAGNOSIS — K859 Acute pancreatitis without necrosis or infection, unspecified: Secondary | ICD-10-CM | POA: Diagnosis not present

## 2022-12-05 DIAGNOSIS — Z822 Family history of deafness and hearing loss: Secondary | ICD-10-CM

## 2022-12-05 DIAGNOSIS — Z800 Family history of malignant neoplasm of digestive organs: Secondary | ICD-10-CM

## 2022-12-05 DIAGNOSIS — N281 Cyst of kidney, acquired: Secondary | ICD-10-CM | POA: Diagnosis not present

## 2022-12-05 DIAGNOSIS — Z515 Encounter for palliative care: Secondary | ICD-10-CM | POA: Diagnosis not present

## 2022-12-05 DIAGNOSIS — C7951 Secondary malignant neoplasm of bone: Secondary | ICD-10-CM | POA: Diagnosis not present

## 2022-12-05 DIAGNOSIS — R110 Nausea: Secondary | ICD-10-CM | POA: Diagnosis not present

## 2022-12-05 DIAGNOSIS — G893 Neoplasm related pain (acute) (chronic): Secondary | ICD-10-CM | POA: Diagnosis not present

## 2022-12-05 DIAGNOSIS — Z87891 Personal history of nicotine dependence: Secondary | ICD-10-CM

## 2022-12-05 DIAGNOSIS — R0902 Hypoxemia: Secondary | ICD-10-CM

## 2022-12-05 DIAGNOSIS — I110 Hypertensive heart disease with heart failure: Secondary | ICD-10-CM | POA: Diagnosis present

## 2022-12-05 DIAGNOSIS — J90 Pleural effusion, not elsewhere classified: Secondary | ICD-10-CM | POA: Diagnosis not present

## 2022-12-05 DIAGNOSIS — J9601 Acute respiratory failure with hypoxia: Secondary | ICD-10-CM | POA: Diagnosis present

## 2022-12-05 DIAGNOSIS — Z888 Allergy status to other drugs, medicaments and biological substances status: Secondary | ICD-10-CM | POA: Diagnosis not present

## 2022-12-05 DIAGNOSIS — J9811 Atelectasis: Secondary | ICD-10-CM | POA: Diagnosis not present

## 2022-12-05 DIAGNOSIS — Z743 Need for continuous supervision: Secondary | ICD-10-CM | POA: Diagnosis not present

## 2022-12-05 DIAGNOSIS — C3491 Malignant neoplasm of unspecified part of right bronchus or lung: Secondary | ICD-10-CM | POA: Diagnosis not present

## 2022-12-05 DIAGNOSIS — Z8261 Family history of arthritis: Secondary | ICD-10-CM

## 2022-12-05 DIAGNOSIS — J449 Chronic obstructive pulmonary disease, unspecified: Secondary | ICD-10-CM | POA: Diagnosis present

## 2022-12-05 DIAGNOSIS — Z85820 Personal history of malignant melanoma of skin: Secondary | ICD-10-CM

## 2022-12-05 DIAGNOSIS — Z6830 Body mass index (BMI) 30.0-30.9, adult: Secondary | ICD-10-CM

## 2022-12-05 DIAGNOSIS — K5900 Constipation, unspecified: Secondary | ICD-10-CM | POA: Diagnosis present

## 2022-12-05 DIAGNOSIS — R8271 Bacteriuria: Secondary | ICD-10-CM

## 2022-12-05 DIAGNOSIS — J439 Emphysema, unspecified: Secondary | ICD-10-CM | POA: Diagnosis not present

## 2022-12-05 DIAGNOSIS — Z66 Do not resuscitate: Secondary | ICD-10-CM | POA: Diagnosis present

## 2022-12-05 DIAGNOSIS — I10 Essential (primary) hypertension: Secondary | ICD-10-CM

## 2022-12-05 DIAGNOSIS — Z8673 Personal history of transient ischemic attack (TIA), and cerebral infarction without residual deficits: Secondary | ICD-10-CM

## 2022-12-05 DIAGNOSIS — M810 Age-related osteoporosis without current pathological fracture: Secondary | ICD-10-CM | POA: Diagnosis present

## 2022-12-05 DIAGNOSIS — I5032 Chronic diastolic (congestive) heart failure: Secondary | ICD-10-CM | POA: Diagnosis present

## 2022-12-05 DIAGNOSIS — F411 Generalized anxiety disorder: Secondary | ICD-10-CM

## 2022-12-05 DIAGNOSIS — E669 Obesity, unspecified: Secondary | ICD-10-CM | POA: Diagnosis present

## 2022-12-05 DIAGNOSIS — Z923 Personal history of irradiation: Secondary | ICD-10-CM

## 2022-12-05 DIAGNOSIS — C3490 Malignant neoplasm of unspecified part of unspecified bronchus or lung: Secondary | ICD-10-CM | POA: Diagnosis not present

## 2022-12-05 DIAGNOSIS — R6889 Other general symptoms and signs: Secondary | ICD-10-CM | POA: Diagnosis not present

## 2022-12-05 DIAGNOSIS — E785 Hyperlipidemia, unspecified: Secondary | ICD-10-CM | POA: Diagnosis not present

## 2022-12-05 DIAGNOSIS — E871 Hypo-osmolality and hyponatremia: Secondary | ICD-10-CM | POA: Diagnosis present

## 2022-12-05 DIAGNOSIS — D649 Anemia, unspecified: Secondary | ICD-10-CM | POA: Diagnosis present

## 2022-12-05 DIAGNOSIS — K219 Gastro-esophageal reflux disease without esophagitis: Secondary | ICD-10-CM | POA: Diagnosis present

## 2022-12-05 DIAGNOSIS — C3411 Malignant neoplasm of upper lobe, right bronchus or lung: Secondary | ICD-10-CM | POA: Diagnosis not present

## 2022-12-05 DIAGNOSIS — I2510 Atherosclerotic heart disease of native coronary artery without angina pectoris: Secondary | ICD-10-CM | POA: Diagnosis present

## 2022-12-05 DIAGNOSIS — Z7951 Long term (current) use of inhaled steroids: Secondary | ICD-10-CM

## 2022-12-05 DIAGNOSIS — A419 Sepsis, unspecified organism: Secondary | ICD-10-CM | POA: Diagnosis not present

## 2022-12-05 DIAGNOSIS — R918 Other nonspecific abnormal finding of lung field: Secondary | ICD-10-CM | POA: Diagnosis not present

## 2022-12-05 LAB — COMPREHENSIVE METABOLIC PANEL
ALT: 10 U/L (ref 0–44)
AST: 18 U/L (ref 15–41)
Albumin: 1.8 g/dL — ABNORMAL LOW (ref 3.5–5.0)
Alkaline Phosphatase: 48 U/L (ref 38–126)
Anion gap: 6 (ref 5–15)
BUN: 10 mg/dL (ref 8–23)
CO2: 31 mmol/L (ref 22–32)
Calcium: 10.4 mg/dL — ABNORMAL HIGH (ref 8.9–10.3)
Chloride: 101 mmol/L (ref 98–111)
Creatinine, Ser: 0.62 mg/dL (ref 0.44–1.00)
GFR, Estimated: >60 mL/min (ref >60)
Glucose, Bld: 125 mg/dL — ABNORMAL HIGH (ref 70–99)
Potassium: 3.4 mmol/L — ABNORMAL LOW (ref 3.5–5.1)
Sodium: 138 mmol/L (ref 135–145)
Total Bilirubin: 0.6 mg/dL (ref 0.3–1.2)
Total Protein: 5.7 g/dL — ABNORMAL LOW (ref 6.5–8.1)

## 2022-12-05 LAB — CBC WITH DIFFERENTIAL/PLATELET
Abs Immature Granulocytes: 0.15 10*3/uL — ABNORMAL HIGH (ref 0.00–0.07)
Basophils Absolute: 0 10*3/uL (ref 0.0–0.1)
Basophils Relative: 0 %
Eosinophils Absolute: 0.1 10*3/uL (ref 0.0–0.5)
Eosinophils Relative: 0 %
HCT: 31.2 % — ABNORMAL LOW (ref 36.0–46.0)
Hemoglobin: 9.3 g/dL — ABNORMAL LOW (ref 12.0–15.0)
Immature Granulocytes: 1 %
Lymphocytes Relative: 6 %
Lymphs Abs: 1.2 10*3/uL (ref 0.7–4.0)
MCH: 24.5 pg — ABNORMAL LOW (ref 26.0–34.0)
MCHC: 29.8 g/dL — ABNORMAL LOW (ref 30.0–36.0)
MCV: 82.1 fL (ref 80.0–100.0)
Monocytes Absolute: 1.8 10*3/uL — ABNORMAL HIGH (ref 0.1–1.0)
Monocytes Relative: 9 %
Neutro Abs: 17.1 10*3/uL — ABNORMAL HIGH (ref 1.7–7.7)
Neutrophils Relative %: 84 %
Platelets: 738 10*3/uL — ABNORMAL HIGH (ref 150–400)
RBC: 3.8 MIL/uL — ABNORMAL LOW (ref 3.87–5.11)
RDW: 17.1 % — ABNORMAL HIGH (ref 11.5–15.5)
WBC: 20.3 10*3/uL — ABNORMAL HIGH (ref 4.0–10.5)
nRBC: 0 % (ref 0.0–0.2)

## 2022-12-05 LAB — URINALYSIS, ROUTINE W REFLEX MICROSCOPIC
Bacteria, UA: NONE SEEN
Bilirubin Urine: NEGATIVE
Glucose, UA: NEGATIVE mg/dL
Hgb urine dipstick: NEGATIVE
Ketones, ur: NEGATIVE mg/dL
Nitrite: NEGATIVE
Protein, ur: NEGATIVE mg/dL
Specific Gravity, Urine: 1.01 (ref 1.005–1.030)
pH: 7 (ref 5.0–8.0)

## 2022-12-05 LAB — BLOOD GAS, VENOUS
Acid-Base Excess: 8.3 mmol/L — ABNORMAL HIGH (ref 0.0–2.0)
Bicarbonate: 33.4 mmol/L — ABNORMAL HIGH (ref 20.0–28.0)
O2 Saturation: 61.3 %
Patient temperature: 37
pCO2, Ven: 47 mmHg (ref 44–60)
pH, Ven: 7.46 — ABNORMAL HIGH (ref 7.25–7.43)
pO2, Ven: 34 mmHg (ref 32–45)

## 2022-12-05 LAB — LIPASE, BLOOD: Lipase: 22 U/L (ref 11–51)

## 2022-12-05 MED ORDER — IOHEXOL 300 MG/ML  SOLN
100.0000 mL | Freq: Once | INTRAMUSCULAR | Status: AC | PRN
  Administered 2022-12-05: 100 mL via INTRAVENOUS

## 2022-12-05 MED ORDER — ONDANSETRON HCL 4 MG/2ML IJ SOLN
4.0000 mg | Freq: Four times a day (QID) | INTRAMUSCULAR | Status: DC | PRN
  Administered 2022-12-06 – 2022-12-24 (×5): 4 mg via INTRAVENOUS
  Filled 2022-12-05 (×5): qty 2

## 2022-12-05 MED ORDER — HYDROCODONE-ACETAMINOPHEN 5-325 MG PO TABS
1.0000 | ORAL_TABLET | ORAL | Status: DC | PRN
  Administered 2022-12-05: 1 via ORAL
  Filled 2022-12-05: qty 1

## 2022-12-05 MED ORDER — PRIMIDONE 50 MG PO TABS
50.0000 mg | ORAL_TABLET | Freq: Every day | ORAL | Status: DC
  Administered 2022-12-06 – 2022-12-23 (×19): 50 mg via ORAL
  Filled 2022-12-05 (×21): qty 1

## 2022-12-05 MED ORDER — IPRATROPIUM-ALBUTEROL 0.5-2.5 (3) MG/3ML IN SOLN
3.0000 mL | RESPIRATORY_TRACT | Status: DC | PRN
  Administered 2022-12-09: 3 mL via RESPIRATORY_TRACT
  Filled 2022-12-05: qty 3

## 2022-12-05 MED ORDER — MELATONIN 5 MG PO TABS
10.0000 mg | ORAL_TABLET | Freq: Every evening | ORAL | Status: DC | PRN
  Administered 2022-12-07: 10 mg via ORAL
  Filled 2022-12-05: qty 2

## 2022-12-05 MED ORDER — ACETAMINOPHEN 325 MG PO TABS: 650.0000 mg | ORAL_TABLET | Freq: Four times a day (QID) | ORAL | Status: DC | PRN

## 2022-12-05 MED ORDER — HEPARIN SODIUM (PORCINE) 5000 UNIT/ML IJ SOLN
5000.0000 [IU] | Freq: Three times a day (TID) | INTRAMUSCULAR | Status: DC
  Administered 2022-12-06 – 2022-12-24 (×54): 5000 [IU] via SUBCUTANEOUS
  Filled 2022-12-05 (×53): qty 1

## 2022-12-05 MED ORDER — ONDANSETRON HCL 4 MG PO TABS
4.0000 mg | ORAL_TABLET | Freq: Four times a day (QID) | ORAL | Status: DC | PRN
  Administered 2022-12-06: 4 mg via ORAL
  Filled 2022-12-05: qty 1

## 2022-12-05 MED ORDER — MOMETASONE FURO-FORMOTEROL FUM 200-5 MCG/ACT IN AERO
2.0000 | INHALATION_SPRAY | Freq: Two times a day (BID) | RESPIRATORY_TRACT | Status: DC
  Administered 2022-12-06 – 2022-12-24 (×38): 2 via RESPIRATORY_TRACT
  Filled 2022-12-05 (×2): qty 8.8

## 2022-12-05 MED ORDER — ALPRAZOLAM 0.25 MG PO TABS
0.2500 mg | ORAL_TABLET | Freq: Every day | ORAL | Status: DC
  Administered 2022-12-06 – 2022-12-23 (×19): 0.25 mg via ORAL
  Filled 2022-12-05 (×19): qty 1

## 2022-12-05 MED ORDER — METOPROLOL SUCCINATE ER 50 MG PO TB24
50.0000 mg | ORAL_TABLET | Freq: Every day | ORAL | Status: DC
  Administered 2022-12-06 – 2022-12-24 (×18): 50 mg via ORAL
  Filled 2022-12-05 (×19): qty 1

## 2022-12-05 MED ORDER — SODIUM CHLORIDE 0.9 % IV BOLUS
1000.0000 mL | Freq: Once | INTRAVENOUS | Status: AC
  Administered 2022-12-05: 1000 mL via INTRAVENOUS

## 2022-12-05 MED ORDER — HYDROCODONE-ACETAMINOPHEN 5-325 MG PO TABS
1.0000 | ORAL_TABLET | Freq: Once | ORAL | Status: AC
  Administered 2022-12-05: 1 via ORAL
  Filled 2022-12-05: qty 1

## 2022-12-05 MED ORDER — ONDANSETRON HCL 4 MG/2ML IJ SOLN
4.0000 mg | Freq: Once | INTRAMUSCULAR | Status: AC
  Administered 2022-12-05: 4 mg via INTRAVENOUS
  Filled 2022-12-05: qty 2

## 2022-12-05 MED ORDER — ACETAMINOPHEN 650 MG RE SUPP: 650.0000 mg | Freq: Four times a day (QID) | RECTAL | Status: DC | PRN

## 2022-12-05 MED ORDER — HYDROCODONE-ACETAMINOPHEN 7.5-325 MG PO TABS: 1.0000 | ORAL_TABLET | Freq: Four times a day (QID) | ORAL | Status: DC | PRN

## 2022-12-05 MED ORDER — SODIUM CHLORIDE 0.9 % IV SOLN
1.0000 g | Freq: Once | INTRAVENOUS | Status: AC
  Administered 2022-12-05: 1 g via INTRAVENOUS
  Filled 2022-12-05: qty 10

## 2022-12-05 MED ORDER — PANTOPRAZOLE SODIUM 40 MG PO TBEC
40.0000 mg | DELAYED_RELEASE_TABLET | Freq: Every day | ORAL | Status: DC
  Administered 2022-12-06 – 2022-12-24 (×19): 40 mg via ORAL
  Filled 2022-12-05 (×19): qty 1

## 2022-12-05 MED ORDER — PIPERACILLIN-TAZOBACTAM 3.375 G IVPB
3.3750 g | Freq: Three times a day (TID) | INTRAVENOUS | Status: DC
  Administered 2022-12-05 – 2022-12-06 (×2): 3.375 g via INTRAVENOUS
  Filled 2022-12-05 (×2): qty 50

## 2022-12-05 MED ORDER — HYDROCODONE-ACETAMINOPHEN 5-325 MG PO TABS: 1.0000 | ORAL_TABLET | ORAL | Status: DC | PRN

## 2022-12-05 NOTE — Assessment & Plan Note (Signed)
Pt without any urinary symptoms including dysuria, urinary frequency, urinary urgency or flank pain.

## 2022-12-05 NOTE — Telephone Encounter (Signed)
-----   Message from Loni Muse, MD sent at 12/05/2022  4:09 PM EDT ----- I think all of that will be addressed in the ED  ----- Message ----- From: Arville Care, CMA Sent: 12/05/2022   2:19 PM EDT To: Loni Muse, MD  Patient's daughter Pamelia Hoit called to say her mother was taken to the hospital [facility]) by EMS. She states that apparently she is not getting fluids or eating. She also said that the morphine seems to be too strong. Wants to know if the morphine can be changed? She is also currently set up to have he port placed on Monday.

## 2022-12-05 NOTE — ED Notes (Signed)
ED TO INPATIENT HANDOFF REPORT  Name/Age/Gender Courtney Brown 82 y.o. female  Code Status    Code Status Orders  (From admission, onward)           Start     Ordered   12/05/22 2153  Do not attempt resuscitation (DNR)  Continuous       Question Answer Comment  If patient has no pulse and is not breathing Do Not Attempt Resuscitation   If patient has a pulse and/or is breathing: Medical Treatment Goals LIMITED ADDITIONAL INTERVENTIONS: Use medication/IV fluids and cardiac monitoring as indicated; Do not use intubation or mechanical ventilation (DNI), also provide comfort medications.  Transfer to Progressive/Stepdown as indicated, avoid Intensive Care.   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]  Level of Care/Admitting Diagnosis ED Disposition     ED Disposition  Admit   Condition  --   Comment  Hospital Area: [facility] [[id number]]  Level of Care: Med-Surg [16]  May place patient in observation at [facility] or [facility] if equivalent level of care is available:: No  Covid Evaluation: Asymptomatic - no recent exposure (last 10 days) testing not required  Diagnosis: Stage IV squamous cell carcinoma of right lung [facility]) [[id number]]  Admitting Physician: Imogene Burn, ERIC [[id number]]  Attending Physician: Imogene Burn, ERIC [[id number]]          Medical History Past Medical History:  Diagnosis Date   Allergy    Anemia    Anxiety    Arthritis    Cancer (HCC)    hx  of melanoma    Colon polyps    COPD (chronic obstructive pulmonary disease) (HCC)    Coronary artery disease    Dyspnea    with exertion    Edema    in lower extremties    Emphysema of lung (HCC)    GERD (gastroesophageal reflux disease)    H/O hiatal hernia    Hyperlipidemia    Hypertension    Inverted nipple    bilateral, pt says that's normal   Lung cancer (HCC) 10/23/2022   Malignant melanoma (HCC) 1983   again in 1995]   Myocardial infarct, old 2008   no stent placed   Osteoporosis    Pneumonia    hx of    Sepsis (HCC)    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    Stroke [facility])    TIA    Allergies Allergies  Allergen Reactions   Prednisone Other (See Comments)    Leg pain,light headed   Statins     Myalgias- per patient, she feels better off the medication    IV Location/Drains/Wounds Patient Lines/Drains/Airways Status     Active Line/Drains/Airways     Name Placement date Placement time Site Days   Peripheral IV 12/05/22 24 G Right Antecubital 12/05/22  1400  Antecubital  less than  1   Peripheral IV 12/05/22 22 G 1.75" Left;Anterior Forearm 12/05/22  1808  Forearm  less than 1   Incision - 3 Ports Abdomen Left;Upper Left;Mid Left;Lower --  --  -- --            Labs/Imaging Results for orders placed or performed during the hospital encounter of 12/05/22 (from the past 48 hour(s))  Comprehensive metabolic panel     Status: Abnormal   Collection Time: 12/05/22  2:39 PM  Result Value Ref Range   Sodium 138 135 - 145 mmol/L   Potassium 3.4 (L) 3.5 - 5.1 mmol/L   Chloride 101 98 - 111 mmol/L   CO2 31 22 - 32 mmol/L   Glucose, Bld 125 (H) 70 - 99 mg/dL    Comment: Glucose reference range applies only to samples taken after fasting for at least 8 hours.   BUN 10 8 - 23 mg/dL   Creatinine, Ser 9.60 0.44 - 1.00 mg/dL   Calcium 45.4 (H) 8.9 - 10.3 mg/dL   Total Protein 5.7 (L) 6.5 - 8.1 g/dL   Albumin 1.8  (L) 3.5 - 5.0 g/dL   AST 18 15 - 41 U/L   ALT 10 0 - 44 U/L   Alkaline Phosphatase 48 38 - 126 U/L   Total Bilirubin 0.6 0.3 - 1.2 mg/dL   GFR, Estimated >09 >81 mL/min    Comment: (NOTE) Calculated using the CKD-EPI Creatinine Equation (2021)    Anion gap 6 5 - 15    Comment: Performed at [facility], [address] [place], [place] [address]  CBC with Differential     Status: Abnormal   Collection Time: 12/05/22  2:39 PM  Result Value Ref Range   WBC 20.3 (H) 4.0 - 10.5 K/uL   RBC 3.80 (L) 3.87 - 5.11 MIL/uL   Hemoglobin 9.3 (L) 12.0 - 15.0 g/dL   HCT 82.9 (L) 56.2 - 13.0 %   MCV 82.1 80.0 - 100.0 fL   MCH 24.5 (L) 26.0 - 34.0 pg   MCHC 29.8 (L) 30.0 - 36.0 g/dL   RDW 86.5 (H) 78.4 - 69.6 %   Platelets 738 (H) 150 - 400 K/uL   nRBC 0.0 0.0 - 0.2 %   Neutrophils Relative % 84 %   Neutro Abs 17.1 (H) 1.7 - 7.7 K/uL   Lymphocytes Relative 6 %   Lymphs Abs 1.2 0.7 - 4.0 K/uL   Monocytes Relative 9 %   Monocytes Absolute 1.8 (H) 0.1 - 1.0 K/uL   Eosinophils Relative 0 %   Eosinophils Absolute 0.1 0.0 - 0.5 K/uL   Basophils Relative 0 %   Basophils Absolute 0.0 0.0 - 0.1 K/uL   Immature Granulocytes 1 %   Abs Immature Granulocytes 0.15 (H) 0.00 - 0.07 K/uL    Comment: Performed at [facility], [address], [place], [place] [address]  Lipase, blood     Status: None   Collection Time: 12/05/22  2:39 PM  Result Value Ref Range   Lipase 22 11 - 51 U/L    Comment: Performed at [facility], [address] [place], [place] [address]  Urinalysis, Routine w reflex microscopic -Urine, Clean Catch     Status: Abnormal   Collection Time: 12/05/22  2:39 PM  Result Value Ref Range   Color, Urine YELLOW YELLOW   APPearance CLOUDY (A) CLEAR   Specific Gravity, Urine 1.010 1.005 - 1.030   pH 7.0 5.0 -  8.0   Glucose, UA NEGATIVE NEGATIVE mg/dL   Hgb urine dipstick NEGATIVE NEGATIVE   Bilirubin Urine NEGATIVE NEGATIVE    Ketones, ur NEGATIVE NEGATIVE mg/dL   Protein, ur NEGATIVE NEGATIVE mg/dL   Nitrite NEGATIVE NEGATIVE   Leukocytes,Ua LARGE (A) NEGATIVE   RBC / HPF 0-5 0 - 5 RBC/hpf   WBC, UA 21-50 0 - 5 WBC/hpf   Bacteria, UA NONE SEEN NONE SEEN   Squamous Epithelial / HPF 0-5 0 - 5 /HPF   WBC Clumps PRESENT     Comment: Performed at [facility], [address], [place], [place] [address]  Blood gas, venous (at [facility] and [facility])     Status: Abnormal   Collection Time: 12/05/22  3:00 PM  Result Value Ref Range   pH, Ven 7.46 (H) 7.25 - 7.43   pCO2, Ven 47 44 - 60 mmHg   pO2, Ven 34 32 - 45 mmHg   Bicarbonate 33.4 (H) 20.0 - 28.0 mmol/L   Acid-Base Excess 8.3 (H) 0.0 - 2.0 mmol/L   O2 Saturation 61.3 %   Patient temperature 37.0     Comment: Performed at [facility], [address], [place], [place] [address]   CT CHEST ABDOMEN PELVIS W CONTRAST  Result Date: 12/05/2022 CLINICAL DATA:  Sepsis, history of pulmonary malignancy. Shortness of breath, nausea and vomiting. EXAM: CT CHEST, ABDOMEN, AND PELVIS WITH CONTRAST TECHNIQUE: Multidetector CT imaging of the chest, abdomen and pelvis was performed following the standard protocol during bolus administration of intravenous contrast. RADIATION DOSE REDUCTION: This exam was performed according to the departmental dose-optimization program which includes automated exposure control, adjustment of the mA and/or kV according to patient size and/or use of iterative reconstruction technique. CONTRAST:  OMNIPAQUE IOHEXOL 300 MG/ML  SOLN COMPARISON:  02/13/2021, 11/18/2022, 10/23/2022. FINDINGS: CT CHEST FINDINGS Cardiovascular: Heart is enlarged and there is a trace pericardial effusion. Few scattered coronary artery calcifications are noted. There is atherosclerotic calcification of the aorta without evidence of aneurysm. The pulmonary trunk is normal in caliber. Mediastinum/Nodes: Enlarged lymph nodes are present in the  mediastinum measuring up to 1 cm in the right paratracheal space there suspected enlarged lymph nodes at the right hilum measuring up to 1.5 cm. No left hilar or axillary lymphadenopathy. The thyroid gland, trachea, and esophagus are within normal limits. Lungs/Pleura: Paraseptal and centrilobular emphysematous changes are present in the lungs. There is a small loculated pleural effusion on the right. There is redemonstration of a right upper lobe mass with central necrosis and extension of tumor into the chest wall musculature and soft tissues on the right. Atelectasis is present at the right lung base. Spiculated opacities are noted in the left upper lobe with a few satellite nodules and not significantly changed from the prior exam. Few pulmonary nodules are noted in the left lower lobe, the largest measuring 5 mm, axial image 93, new from the previous exam. Musculoskeletal: There are erosions and bony destruction involving the T3 and T4 ribs on the right. No acute fracture is seen. CT ABDOMEN PELVIS FINDINGS Hepatobiliary: No focal liver abnormality is seen. Status post cholecystectomy. No biliary dilatation. Pancreas: Unremarkable. No pancreatic ductal dilatation or surrounding inflammatory changes. Spleen: Normal in size without focal abnormality. 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 The left adrenal gland is within normal limits. The kidneys enhance symmetrically with multifocal renal cortical thinning or scarring. Stable cysts and hypodensities are present within the  kidneys bilaterally. No renal calculus or hydronephrosis. The bladder is unremarkable. Stomach/Bowel: Stomach is within normal limits. Appendix appears normal. No evidence of bowel wall thickening, distention, or inflammatory changes. No free air or pneumatosis. A few scattered diverticula are present along the colon without evidence of diverticulitis. Vascular/Lymphatic: Aortic  atherosclerosis. No enlarged abdominal or pelvic lymph nodes. Reproductive: Status post hysterectomy. No adnexal masses. Other: A trace amount of free fluid is noted in the pelvis. 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 Musculoskeletal: No acute or suspicious osseous abnormality. IMPRESSION: 1. Necrotic right upper lobe mass, increased in size from the prior exam with extension into the chest wall musculature on the right with erosions and destructive changes of the T3 and T4 ribs on the right. 2. Small right pleural effusion with atelectasis. 3. Stable spiculated opacities in the left upper lobe with a few surrounding satellite nodules and new pulmonary nodules in the left lower lobe, suspicious for metastasis. 4. No acute process in the abdomen and pelvis. 5. Coronary artery calcifications and aortic atherosclerosis. Electronically Signed   By: Thornell Sartorius M.D.   On: 12/05/2022 20:44   DG Chest 2 View  Result Date: 12/05/2022 CLINICAL DATA:  Lung cancer.  Shortness of breath EXAM: CHEST - 2 VIEW COMPARISON:  X-ray 10/15/2022.  PET-CT scan 11/18/2022. FINDINGS: Large right upper lobe mass is increasing in size compared to the prior x-ray. Please correlate with more recent PET-CT. There is a small right effusion which is increased as well. Left lung is grossly clear without pneumothorax or effusion. No left-sided consolidation. Stable cardiopericardial silhouette without edema. Overlapping cardiac leads. Film is rotated to the right. IMPRESSION: Increasing right-sided lung mass and right-sided pleural effusion. Please correlate with recent PET-CT Electronically Signed   By: Karen Kays M.D.   On: 12/05/2022 15:18    Pending Labs Unresulted Labs (From admission, onward)     Start     Ordered   12/05/22 1655  Urine Culture  Add-on,   AD       Question:  Indication  Answer:  Dysuria    12/05/22 1654            Vitals/Pain Today's Vitals   12/05/22 1800 12/05/22 1811 12/05/22 2100 12/05/22 2107  BP: (!) 135/45  (!) 149/66   Pulse: 89  (!) 104   Resp: 14  16   Temp:  98.2 F (36.8 C)    TempSrc:  Oral    SpO2: 95%  93%   PainSc:    3     Isolation Precautions No active isolations  Medications Medications  HYDROcodone-acetaminophen (NORCO/VICODIN) 5-325 MG per tablet 1 tablet (has no administration in time range)  ondansetron (ZOFRAN) injection 4 mg (4 mg Intravenous Given 12/05/22 1559)  sodium chloride 0.9 % bolus 1,000 mL (0 mLs Intravenous Stopped 12/05/22 2107)  HYDROcodone-acetaminophen (NORCO/VICODIN) 5-325 MG per tablet 1 tablet (1 tablet Oral Given 12/05/22 1559)  cefTRIAXone (ROCEPHIN) 1 g in sodium chloride 0.9 % 100 mL IVPB (0 g Intravenous Stopped 12/05/22 2107)  iohexol (OMNIPAQUE) 300 MG/ML solution 100 mL (100 mLs Intravenous Contrast Given 12/05/22 2007)    Mobility walks with person assist

## 2022-12-05 NOTE — Assessment & Plan Note (Signed)
Continue with supplemental O2. Will likely need home O2. Home O2 evaluation.

## 2022-12-05 NOTE — ED Provider Notes (Signed)
[place] EMERGENCY DEPARTMENT AT [facility] Provider Note   CSN: [id number] Arrival date & time: 12/05/22  1413     History  Chief Complaint  Patient presents with   Vomiting    Courtney Brown is a 82 y.o. female with history of COPD, chronic respiratory failure with hypercapnia, not on home O2, hx of pulmonary malignancy with bone metastasis (NSCLC), recently diagnosed, presented to the ED with complaint of pain, nausea, weakness.  Patient reports that she was switched 3 to 4 days ago from hydrocodone Norco medication for pain to morphine 7.5 mg extended release.  She says ever since the switch she has been nauseated, feels weak, shaky and jittery.  Her last dose of morphine was this morning.  She feels fatigued.  She denies vomiting, diarrhea, or constipation.  Patient is a former smoker of 35 to 40 years of smoking, quit in 2007.  She was seen by oncology and establish care this year for her new diagnosis of suspected pulmonary cancer, with CT imaging showing right upper lobe central mass with some occlusion and probable postobstructive pneumonia findings originally, with bone metastasis to the fourth rib and some spiculated densities in the left upper lobe concerning for metastatic disease.  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  MRI of the brain in April, 1 month ago, did not show any clear evidence of metastatic disease to the brain.  HPI     Home Medications Prior to Admission medications   Medication Sig Start Date End Date Taking? Authorizing Provider  albuterol (VENTOLIN HFA) 108 (90 Base) MCG/ACT inhaler INHALE 1-2 PUFFS BY MOUTH EVERY 6 HOURS AS NEEDED FOR WHEEZE OR SHORTNESS OF BREATH 10/13/22   [name], MD  ALPRAZolam Prudy Feeler) 0.25 MG tablet Take 1 tablet (0.25 mg total) by mouth at bedtime. 10/27/22   Leslye Peer, MD  budesonide-formoterol (SYMBICORT) 160-4.5 MCG/ACT inhaler TAKE 2 PUFFS BY  MOUTH TWICE A DAY 06/06/22   [name], MD  Calcium Citrate-Vitamin D (CITRACAL + D PO) Take 1 tablet by mouth in the morning. 600 mg calcium    [provider]  colesevelam (WELCHOL) 625 MG tablet TAKE 1 TABLET (625 MG TOTAL) BY MOUTH 2 (TWO) TIMES DAILY WITH A MEAL. 07/16/22   [name], MD  fenofibrate (TRICOR) 145 MG tablet TAKE 1 TABLET BY MOUTH EVERY DAY 08/01/22   [name], MD  Ferric Maltol (ACCRUFER) 30 MG CAPS Take 1 capsule by mouth in the morning and at bedtime. 02/26/22   [name], MD  folic acid (FOLVITE) 1 MG tablet TAKE 1 TABLET BY MOUTH EVERY DAY 08/24/22   [name], MD  furosemide (LASIX) 20 MG tablet TAKE 1 TABLET BY MOUTH EVERY DAY Patient taking differently: Take 20 mg by mouth daily as needed for fluid. 12/01/22   [name], MD  gabapentin (NEURONTIN) 100 MG capsule Take 1-3 capsules (100-300 mg total) by mouth 2 (two) times daily. Take 1 capsule (100 mg) in the morning and Take 3 capsules (300 mg) at bedtime 10/27/22   Leslye Peer, MD  Garlic Oil 1000 MG CAPS Take 1,000 mg by mouth daily.    [provider]  HYDROcodone-Acetaminophen 7.5-300 MG TABS Take 1 tablet by mouth 4 (four) times daily as needed. Patient not taking: Reported on 12/04/2022 11/21/22   Loni Muse, MD  Lactobacillus (PROBIOTIC ACIDOPHILUS) CAPS Take 1 tablet by mouth daily.  [provider]  loratadine (CLARITIN) 10 MG tablet TAKE 1 TABLET BY MOUTH EVERY DAY 05/01/22   [name], MD  magnesium oxide (MAG-OX) 400 (240 Mg) MG tablet TAKE 1 TABLET BY MOUTH TWICE A DAY 09/05/21   [name], MD  metoprolol succinate (TOPROL-XL) 50 MG 24 hr tablet Take 1 tablet (50 mg total) by mouth daily. 09/09/22   [name], MD  mometasone (NASONEX) 50 MCG/ACT nasal spray PLACE 2 SPRAYS INTO THE NOSE DAILY. 10/31/21   [name], MD  morphine (MSIR) 15 MG tablet Take 0.5 tablets (7.5 mg total) by mouth every 4 (four) hours as needed for  severe pain. 11/28/22 12/28/22  Loni Muse, MD  omeprazole (PRILOSEC) 20 MG capsule TAKE 1 CAPSULE BY MOUTH EVERY DAY 07/07/22   [name], MD  ondansetron (ZOFRAN) 4 MG tablet Take 1 tablet (4 mg total) by mouth every 8 (eight) hours as needed for nausea or vomiting. 10/25/22   Rhetta Mura, MD  primidone (MYSOLINE) 50 MG tablet Take 1 tablet (50 mg total) by mouth at bedtime. 08/22/22 08/17/23  Antony Madura, MD  raloxifene (EVISTA) 60 MG tablet TAKE 1 TABLET BY MOUTH EVERY DAY 05/21/22   [name], MD  triamcinolone cream (KENALOG) 0.1 % Apply 1 Application topically daily as needed (yeast infection under breast). 10/27/22   Leslye Peer, MD  Zinc 50 MG TABS TAKE 1 TABLET BY MOUTH EVERY DAY 08/24/22   [name], MD      Allergies    Prednisone and Statins    Review of Systems   Review of Systems  Physical Exam Updated Vital Signs BP (!) 127/49   Pulse (!) 103   Temp 98 F (36.7 C) (Oral)   Resp 17   SpO2 90%  Physical Exam Constitutional:      General: She is not in acute distress. HENT:     Head: Normocephalic and atraumatic.  Eyes:     Conjunctiva/sclera: Conjunctivae normal.     Pupils: Pupils are equal, round, and reactive to light.  Cardiovascular:     Rate and Rhythm: Regular rhythm. Tachycardia present.  Pulmonary:     Effort: Pulmonary effort is normal. No respiratory distress.     Comments: 91-92% on room air Poor air movement bilaterally, patient speaking in full sentences Abdominal:     General: There is no distension.     Tenderness: There is no abdominal tenderness.  Skin:    General: Skin is warm and dry.  Neurological:     General: No focal deficit present.     Mental Status: She is alert. Mental status is at baseline.  Psychiatric:        Mood and Affect: Mood normal.        Behavior: Behavior normal.     ED Results / Procedures / Treatments   Labs (all labs ordered are listed, but only abnormal results are  displayed) Labs Reviewed  COMPREHENSIVE METABOLIC PANEL  CBC WITH DIFFERENTIAL/PLATELET  LIPASE, BLOOD  URINALYSIS, ROUTINE W REFLEX MICROSCOPIC  BLOOD GAS, VENOUS    EKG None  Radiology DG Chest 2 View  Result Date: 12/05/2022 CLINICAL DATA:  Lung cancer.  Shortness of breath EXAM: CHEST - 2 VIEW COMPARISON:  X-ray 10/15/2022.  PET-CT scan 11/18/2022. FINDINGS: Large right upper lobe mass is increasing in size compared to the prior x-ray. Please correlate with more recent PET-CT. There is a small right effusion which is increased  as well. Left lung is grossly clear without pneumothorax or effusion. No left-sided consolidation. Stable cardiopericardial silhouette without edema. Overlapping cardiac leads. Film is rotated to the right. IMPRESSION: Increasing right-sided lung mass and right-sided pleural effusion. Please correlate with recent PET-CT Electronically Signed   By: Karen Kays M.D.   On: 12/05/2022 15:18    Procedures Procedures    Medications Ordered in ED Medications  ondansetron (ZOFRAN) injection 4 mg (has no administration in time range)  sodium chloride 0.9 % bolus 1,000 mL (has no administration in time range)  HYDROcodone-acetaminophen (NORCO/VICODIN) 5-325 MG per tablet 1 tablet (has no administration in time range)    ED Course/ Medical Decision Making/ A&P Clinical Course as of 12/05/22 1747  Fri Dec 05, 2022  1656 Patient is UA is large leukocytes, negative nitrites.  Will send a urine culture.  Chest x-ray is inconclusive for potential pneumonia.  Unfortunately given her pleural effusion is difficult to visualize the entire lung fields.  At this point, with her leukocytosis and tachycardia I think is reasonable to obtain CT imaging of the chest abdomen pelvis to evaluate for potential source of infection, which would include pneumonia.  Rocephin was ordered. [MT]  1657 Patient signed out to Dr Jeraldine Loots EDP pending f/u on imaging [MT]    Clinical Course User  Index [MT] Kayin Kettering, Kermit Balo, MD                             Medical Decision Making Amount and/or Complexity of Data Reviewed Labs: ordered. Radiology: ordered.  Risk Prescription drug management.   This patient presents to the ED with concern for nausea, shakiness, weakness. This involves an extensive number of treatment options, and is a complaint that carries with it a high risk of complications and morbidity.  The differential diagnosis includes side effect to morphine medicine, versus infection, versus pleural effusion, versus anemia, versus other  Patient reports the correlation of her symptoms with transition from Norco to morphine.  It is possible she is experiencing adverse reactions to morphine including nausea, which can be common with this medication.  The reason she was transitioned to morphine, she reports, is because [facility] was not able to fill the Norco prescription.  Co-morbidities that complicate the patient evaluation: History of pulmonary cancer and obstructive pulmonary lesion high risk of pneumonia  External records from outside source obtained and reviewed including oncology office evaluation, MRI imaging and CT imaging from April  I ordered and personally interpreted labs.  The pertinent results include:  WBC 20.3, elevated.  Hgb 9.3.  VBG unremarkable.  UA + leuks, neg nirties  I ordered imaging studies including x-ray of the chest I independently visualized and interpreted imaging which showed right sided large chest mass, moderate pleural effusion I agree with the radiologist interpretation  The patient was maintained on a cardiac monitor.  I personally viewed and interpreted the cardiac monitored which showed an underlying rhythm of: Sinus tachycardia  I ordered medication including rocephin for UTI, norco for pain  I have reviewed the patients home medicines and have made adjustments as needed   After the interventions noted above, I  reevaluated the patient and found that they have: stayed the same  Disposition:  Patient was signed out to Dr Gerhard Munch EDP pending CT imaging of chest abdomen pelvis to better visualized for potential underlying PNA vs intraabdominal infection.  It is still possible that this presentation is  related to adverse reactions to morphine, but with her leukocytosis it's difficult to exclude infection or alternative inflammatory process.  Patient is stable at 90% on room air, she has a hx of COPD/smoking for many years, this may be close to her baseline.          Final Clinical Impression(s) / ED Diagnoses Final diagnoses:  None    Rx / DC Orders ED Discharge Orders     None         Joby Hershkowitz, Kermit Balo, MD 12/05/22 1751

## 2022-12-05 NOTE — Telephone Encounter (Signed)
Called and spoke with patient's daughter Pamelia Hoit and advised as below from Dr. [name]. I let her know that they may give her a new script for pain medication. She said that her mother is scheduled to see pain management on 5/23. Advised her to keep the appointment and let us know if the ER did make any changes.

## 2022-12-05 NOTE — Progress Notes (Signed)
Pharmacy Antibiotic Note  Shernice Mizerak is a 82 y.o. female admitted on 12/05/2022 with post-obstructive pneumonia.  Patient received ceftriaxone 1gm IV x 1 dose in the ED.  Pharmacy has been consulted for Zosyn dosing.  Plan: Zosyn 3.375g IV q8h (4 hour infusion). Need for further dosage adjustment appears unlikely at present.    Will sign off at this time.  Please reconsult if a change in clinical status warrants re-evaluation of dosage.    Temp (24hrs), Avg:98.2 F (36.8 C), Min:98 F (36.7 C), Max:98.3 F (36.8 C)  Recent Labs  Lab 12/05/22 1439  WBC 20.3*  CREATININE 0.62    Estimated Creatinine Clearance: 45.9 mL/min (by C-G formula based on SCr of 0.62 mg/dL).    Allergies  Allergen Reactions   Prednisone Other (See Comments)    Leg pain,light headed   Statins     Myalgias- per patient, she feels better off the medication    Thank you for allowing pharmacy to be a part of this patient's care.  Maryellen Pile, PharmD 12/05/2022 10:38 PM

## 2022-12-05 NOTE — Assessment & Plan Note (Signed)
Stable Continue inhalers 

## 2022-12-05 NOTE — ED Triage Notes (Addendum)
Pt presents via EMS from home with c/o N/V. Pt has a new diagnosis of bone and lung cancer. Pt has been on hydrocodone, switched to morphine and pt has been vomiting since then. Pt also c/o some shortness of breath, 88% on RA upon EMS arrival. Pt placed on 2L of O2 by EMS and came up to 98%. Pt also recently taken off off Lasix as well. IV established en route by EMS and 4mg  Zofran given en route as well. Pt is alert and oriented at this time.

## 2022-12-05 NOTE — Assessment & Plan Note (Signed)
Observation med/surg bed. Appears to have some post-obstructive pneumonia on CTPA. No PE. Start IV zosyn. I think her overall prognosis is poor.

## 2022-12-05 NOTE — ED Provider Notes (Signed)
9:30 PM Care of the patient assumed at signout.  Now, CT results are available, after delay.  CT consistent with progression of disease, with involvement of the chest wall.  With new oxygen requirement patient will be admitted for further monitoring and management.  Patient also found evidence for urinary tract infection, has received initial antibiotics.    Gerhard Munch, MD 12/05/22 2130

## 2022-12-05 NOTE — Subjective & Objective (Signed)
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  Patient was diagnosed at the beginning of April 2024.  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  She states that her pain has been increasingly worse at home.  She has been taking her Vicodin 5/325 every 4 hours instead of every 6 hours.  Her oncologist changed her over to morphine 15 mg.  She states that the morphine does not help.  She wonders why her dose of her Vicodin was not just increased.  Over the last 3 days, she has been without any pain medicine having increasing pain.  This has been accompanied by worsening shortness of breath due to pain.  She came to the ER for evaluation.  Patient denies any nausea, vomiting, abdominal pain, dysuria, urinary frequency, urinary urgency.  She is scheduled for Port-A-Cath placement on Dec 08, 2022.  On arrival temp 98 heart rate 117 blood pressure 173/57.  Per EMS, room air saturations were 88% on arrival.  White count 20.3 hemoglobin 9.3, platelets of 738  Sodium 138, potassium 3.4, bicarb 31, BUN of 10, creatinine 0.6  CT angio of the chest, abdomen and pelvis demonstrates necrotic right upper lung mass increased in size from her prior examination on 10/23/2022 and 11/18/2022.  There is bony destruction of the T3 and T4 ribs on the right.  Multiple pulmonary nodules on the left consistent with pulmonary mets.  [facility] hospitalist contacted for admission.

## 2022-12-05 NOTE — Assessment & Plan Note (Signed)
Pt states that vicodin 5/325 was prescribed q6h prn. Not lasting the full 6 hours.  Was taking it every 4 hours prn. Oncology changed pain meds to morphine. Pt and dtr states that morphine did not work. Ok to use vicodin 5/325 q4h prn. That would be less than 2000 mg tylenol per day.

## 2022-12-05 NOTE — Assessment & Plan Note (Signed)
Verified DNR/DNI status with pt. Dtr [name] at bedside and witnessed.  Pt will need yellow DNR form completed and sent home with her at discharge. Please take a picture of DNR form and file it under Media tab for future use.

## 2022-12-05 NOTE — Assessment & Plan Note (Signed)
Stable. Continue toprol-xl 50 mg daily. 

## 2022-12-05 NOTE — H&P (Signed)
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  Patient was diagnosed at the beginning of April 2024.  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  She states that her pain has been increasingly worse at home.  She has been taking her Vicodin 5/325 every 4 hours instead of every 6 hours.  Her oncologist changed her over to morphine 15 mg.  She states that the morphine does not help.  She wonders why her dose of her Vicodin was not just increased.  Over the last 3 days, she has been without any pain medicine having increasing pain.  This has been accompanied by worsening shortness of breath due to pain.  She came to the ER for evaluation.  Patient denies any nausea, vomiting, abdominal pain, dysuria, urinary frequency, urinary urgency.  She is scheduled for Port-A-Cath placement on Dec 08, 2022.  On arrival temp 98 heart rate 117 blood pressure 173/57.  Per EMS, room air saturations were 88% on arrival.  White count 20.3 hemoglobin 9.3, platelets of 738  Sodium 138, potassium 3.4, bicarb 31, BUN of 10, creatinine 0.6  CT angio of the chest, abdomen and pelvis demonstrates necrotic right upper lung mass increased in size from her prior examination on 10/23/2022 and 11/18/2022.  There is bony destruction of the T3 and T4 ribs on the right.  Multiple pulmonary nodules on the left consistent with pulmonary mets.  [facility] hospital[facility] contacted for admission.   ED Course: CTPA negative for PE. Shows necrotic RUL lung mass with  mets to ribs, mets to left lung  Review of Systems:  Review of Systems  Constitutional:  Negative for chills and fever.  HENT: Negative.    Eyes: Negative.   Respiratory:  Positive for shortness of breath.   Cardiovascular:  Negative for palpitations and orthopnea.  Gastrointestinal: Negative.  Negative for constipation.  Genitourinary:  Negative for dysuria, flank pain, frequency and urgency.  Musculoskeletal:        Right chest wall pain  Skin: Negative.   Neurological: Negative.   Endo/Heme/Allergies: Negative.   Psychiatric/Behavioral: Negative.    All other systems reviewed and are negative.   Past Medical History:  Diagnosis Date   Allergy    Anemia    Anxiety    Arthritis    Cancer (HCC)    hx of melanoma    Colon polyps    COPD (chronic obstructive pulmonary disease) (HCC)    Coronary artery disease    Dyspnea    with exertion    Edema    in lower extremties    Emphysema of lung (HCC)    GERD (gastroesophageal reflux disease)    H/O hiatal hernia    Hyperlipidemia    Hypertension    Inverted nipple    bilateral, pt says that's normal   Lung cancer (HCC) 10/23/2022   Malignant melanoma (HCC) 1983   again in 1995]   Myocardial infarct, old 2008   no stent placed   Osteoporosis    Pneumonia  hx of    Sepsis (HCC)    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    Stroke [facility])    TIA    Past Surgical History:  Procedure Laterality Date   ABDOMINAL HYSTERECTOMY  1998   BRONCHIAL BIOPSY  10/27/2022   Procedure: BRONCHIAL BIOPSIES;  Surgeon: Leslye Peer, MD;  Location: [facility] ENDOSCOPY;  Service: Cardiopulmonary;;   BRONCHIAL BRUSHINGS  10/27/2022   Procedure: BRONCHIAL BRUSHINGS;  Surgeon: Leslye Peer, MD;  Location: [facility] ENDOSCOPY;  Service: Cardiopulmonary;;   CHOLECYSTECTOMY  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   HEMOSTASIS CONTROL  10/27/2022   Procedure: HEMOSTASIS CONTROL;  Surgeon: Leslye Peer, MD;   Location: [facility] ENDOSCOPY;  Service: Cardiopulmonary;;   HERNIA REPAIR     umbilical, ventral   INCISIONAL HERNIA REPAIR N/A 10/26/2020   Procedure: DIAGNOSTIC LAPAROSCOPY MESH EXPLANTATION; SMALL BOWEL RESECTION; PRIMARY REPAIR OF INCISIONAL HERNIA;  Surgeon: Sheliah Hatch De Blanch, MD;  Location: [facility] ORS;  Service: General;  Laterality: N/A;   INSERTION OF ILIAC STENT  2008   VIDEO BRONCHOSCOPY Right 10/27/2022   Procedure: VIDEO BRONCHOSCOPY WITHOUT FLUORO;  Surgeon: Leslye Peer, MD;  Location: [facility] ENDOSCOPY;  Service: Cardiopulmonary;  Laterality: Right;     reports that she quit smoking about 17 years ago. Her smoking use included cigarettes. She has a 66.00 pack-year smoking history. She has never used smokeless tobacco. She reports that she does not currently use alcohol after a past usage of about 2.0 standard drinks of alcohol per week. She reports that she does not use drugs.  Allergies  Allergen Reactions   Prednisone Other (See Comments)    Leg pain,light headed   Statins     Myalgias- per patient, she feels better off the medication    Family History  Problem Relation Age of Onset   Arthritis Mother    Hearing loss Father    Colon cancer Brother    Hearing loss Maternal Grandmother    Cancer Maternal Grandmother        colon   Cancer Paternal Grandmother        colon   Hearing loss Maternal Aunt    Breast cancer Neg Hx     Prior to Admission medications   Medication Sig Start Date End Date Taking? Authorizing Provider  albuterol (VENTOLIN HFA) 108 (90 Base) MCG/ACT inhaler INHALE 1-2 PUFFS BY MOUTH EVERY 6 HOURS AS NEEDED FOR WHEEZE OR SHORTNESS OF BREATH 10/13/22   [name], MD  ALPRAZolam Prudy Feeler) 0.25 MG tablet Take 1 tablet (0.25 mg total) by mouth at bedtime. 10/27/22   Leslye Peer, MD  budesonide-formoterol (SYMBICORT) 160-4.5 MCG/ACT inhaler TAKE 2 PUFFS BY MOUTH TWICE A DAY 06/06/22   [name], MD  Calcium Citrate-Vitamin D (CITRACAL + D PO) Take 1  tablet by mouth in the morning. 600 mg calcium    [provider]  colesevelam (WELCHOL) 625 MG tablet TAKE 1 TABLET (625 MG TOTAL) BY MOUTH 2 (TWO) TIMES DAILY WITH A MEAL. 07/16/22   [name], MD  fenofibrate (TRICOR) 145 MG tablet TAKE 1 TABLET BY MOUTH EVERY DAY 08/01/22   [name], MD  Ferric Maltol (ACCRUFER) 30 MG CAPS Take 1 capsule by mouth in the morning and at bedtime. 02/26/22   [name], MD  folic acid (FOLVITE) 1 MG tablet TAKE 1 TABLET BY MOUTH EVERY DAY 08/24/22   [name], MD  furosemide (LASIX) 20 MG tablet TAKE 1 TABLET BY MOUTH EVERY DAY Patient taking differently: Take 20 mg by mouth daily as needed for fluid. 12/01/22   [name], MD  gabapentin (NEURONTIN) 100 MG capsule Take 1-3 capsules (100-300 mg total) by mouth 2 (two) times daily. Take 1 capsule (100 mg) in the morning and Take 3 capsules (300 mg) at bedtime 10/27/22   Leslye Peer, MD  Garlic Oil 1000 MG CAPS Take 1,000 mg by mouth daily.    [provider]  HYDROcodone-Acetaminophen 7.5-300 MG TABS Take 1 tablet by mouth 4 (four) times daily as needed. Patient not taking: Reported on 12/04/2022 11/21/22   Loni Muse, MD  Lactobacillus (PROBIOTIC ACIDOPHILUS) CAPS Take 1 tablet by mouth daily.    [provider]  loratadine (CLARITIN) 10 MG tablet TAKE 1 TABLET BY MOUTH EVERY DAY 05/01/22   [name], MD  magnesium oxide (MAG-OX) 400 (240 Mg) MG tablet TAKE 1 TABLET BY MOUTH TWICE A DAY 09/05/21   [name], MD  metoprolol succinate (TOPROL-XL) 50 MG 24 hr tablet Take 1 tablet (50 mg total) by mouth daily. 09/09/22   [name], MD  mometasone (NASONEX) 50 MCG/ACT nasal spray PLACE 2 SPRAYS INTO THE NOSE DAILY. 10/31/21   [name], MD  morphine (MSIR) 15 MG tablet Take 0.5 tablets (7.5 mg total) by mouth every 4 (four) hours as needed for severe pain. 11/28/22 12/28/22  Loni Muse, MD  omeprazole (PRILOSEC) 20 MG capsule TAKE 1 CAPSULE  BY MOUTH EVERY DAY 07/07/22   [name], MD  ondansetron (ZOFRAN) 4 MG tablet Take 1 tablet (4 mg total) by mouth every 8 (eight) hours as needed for nausea or vomiting. 10/25/22   Rhetta Mura, MD  primidone (MYSOLINE) 50 MG tablet Take 1 tablet (50 mg total) by mouth at bedtime. 08/22/22 08/17/23  Antony Madura, MD  raloxifene (EVISTA) 60 MG tablet TAKE 1 TABLET BY MOUTH EVERY DAY 05/21/22   [name], MD  triamcinolone cream (KENALOG) 0.1 % Apply 1 Application topically daily as needed (yeast infection under breast). 10/27/22   Leslye Peer, MD  Zinc 50 MG TABS TAKE 1 TABLET BY MOUTH EVERY DAY 08/24/22   [name], MD    Physical Exam: Vitals:   12/05/22 1700 12/05/22 1800 12/05/22 1811 12/05/22 2100  BP: (!) 122/48 (!) 135/45  (!) 149/66  Pulse: 90 89  (!) 104  Resp: 15 14  16   Temp:   98.2 F (36.8 C)   TempSrc:   Oral   SpO2: 92% 95%  93%    Physical Exam Vitals reviewed.  Constitutional:      General: She is not in acute distress.    Appearance: She is not toxic-appearing or diaphoretic.     Comments: Chronically ill appearing  HENT:     Head: Normocephalic and atraumatic.     Nose: Nose normal.  Cardiovascular:     Rate and Rhythm: Normal rate and regular rhythm.  Pulmonary:     Effort: Pulmonary effort is normal.     Comments: No BS in RUL Abdominal:     General: Bowel sounds are normal. There is no distension.     Palpations: Abdomen is soft.     Tenderness: There is no abdominal tenderness.  Musculoskeletal:     Right lower leg: No edema.     Left lower leg: No edema.  Skin:  General: Skin is warm and dry.     Capillary Refill: Capillary refill takes less than 2 seconds.  Neurological:     General: No focal deficit present.     Mental Status: She is alert and oriented to person, place, and time.      Labs on Admission: I have personally reviewed following labs and imaging studies  CBC: Recent Labs  Lab 12/05/22 1439  WBC  20.3*  NEUTROABS 17.1*  HGB 9.3*  HCT 31.2*  MCV 82.1  PLT 738*   Basic Metabolic Panel: Recent Labs  Lab 12/05/22 1439  NA 138  K 3.4*  CL 101  CO2 31  GLUCOSE 125*  BUN 10  CREATININE 0.62  CALCIUM 10.4*   GFR: Estimated Creatinine Clearance: 45.9 mL/min (by C-G formula based on SCr of 0.62 mg/dL). Liver Function Tests: Recent Labs  Lab 12/05/22 1439  AST 18  ALT 10  ALKPHOS 48  BILITOT 0.6  PROT 5.7*  ALBUMIN 1.8*   Recent Labs  Lab 12/05/22 1439  LIPASE 22   No results for input(s): "AMMONIA" in the last 168 hours. Coagulation Profile: No results for input(s): "INR", "PROTIME" in the last 168 hours. Cardiac Enzymes: No results for input(s): "CKTOTAL", "CKMB", "CKMBINDEX", "TROPONINI", "TROPONINIHS" in the last 168 hours. BNP (last 3 results) No results for input(s): "BNP" in the last 8760 hours. HbA1C: No results for input(s): "HGBA1C" in the last 72 hours. CBG: No results for input(s): "GLUCAP" in the last 168 hours. Lipid Profile: No results for input(s): "CHOL", "HDL", "LDLCALC", "TRIG", "CHOLHDL", "LDLDIRECT" in the last 72 hours. Thyroid Function Tests: No results for input(s): "TSH", "T4TOTAL", "FREET4", "T3FREE", "THYROIDAB" in the last 72 hours. Anemia Panel: No results for input(s): "VITAMINB12", "FOLATE", "FERRITIN", "TIBC", "IRON", "RETICCTPCT" in the last 72 hours. Urine analysis:    Component Value Date/Time   COLORURINE YELLOW 12/05/2022 1439   APPEARANCEUR CLOUDY (A) 12/05/2022 1439   LABSPEC 1.010 12/05/2022 1439   PHURINE 7.0 12/05/2022 1439   GLUCOSEU NEGATIVE 12/05/2022 1439   GLUCOSEU NEGATIVE 08/21/2021 1552   HGBUR NEGATIVE 12/05/2022 1439   BILIRUBINUR NEGATIVE 12/05/2022 1439   KETONESUR NEGATIVE 12/05/2022 1439   PROTEINUR NEGATIVE 12/05/2022 1439   UROBILINOGEN 0.2 08/21/2021 1552   NITRITE NEGATIVE 12/05/2022 1439   LEUKOCYTESUR LARGE (A) 12/05/2022 1439    Radiological Exams on Admission: I have personally  reviewed images CT CHEST ABDOMEN PELVIS W CONTRAST  Result Date: 12/05/2022 CLINICAL DATA:  Sepsis, history of pulmonary malignancy. Shortness of breath, nausea and vomiting. EXAM: CT CHEST, ABDOMEN, AND PELVIS WITH CONTRAST TECHNIQUE: Multidetector CT imaging of the chest, abdomen and pelvis was performed following the standard protocol during bolus administration of intravenous contrast. RADIATION DOSE REDUCTION: This exam was performed according to the departmental dose-optimization program which includes automated exposure control, adjustment of the mA and/or kV according to patient size and/or use of iterative reconstruction technique. CONTRAST:  OMNIPAQUE IOHEXOL 300 MG/ML  SOLN COMPARISON:  02/13/2021, 11/18/2022, 10/23/2022. FINDINGS: CT CHEST FINDINGS Cardiovascular: Heart is enlarged and there is a trace pericardial effusion. Few scattered coronary artery calcifications are noted. There is atherosclerotic calcification of the aorta without evidence of aneurysm. The pulmonary trunk is normal in caliber. Mediastinum/Nodes: Enlarged lymph nodes are present in the mediastinum measuring up to 1 cm in the right paratracheal space there suspected enlarged lymph nodes at the right hilum measuring up to 1.5 cm. No left hilar or axillary lymphadenopathy. The thyroid gland, trachea, and esophagus are within normal limits.  Lungs/Pleura: Paraseptal and centrilobular emphysematous changes are present in the lungs. There is a small loculated pleural effusion on the right. There is redemonstration of a right upper lobe mass with central necrosis and extension of tumor into the chest wall musculature and soft tissues on the right. Atelectasis is present at the right lung base. Spiculated opacities are noted in the left upper lobe with a few satellite nodules and not significantly changed from the prior exam. Few pulmonary nodules are noted in the left lower lobe, the largest measuring 5 mm, axial image 93, new  from the previous exam. Musculoskeletal: There are erosions and bony destruction involving the T3 and T4 ribs on the right. No acute fracture is seen. CT ABDOMEN PELVIS FINDINGS Hepatobiliary: No focal liver abnormality is seen. Status post cholecystectomy. No biliary dilatation. Pancreas: Unremarkable. No pancreatic ductal dilatation or surrounding inflammatory changes. Spleen: Normal in size without focal abnormality. 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 The left adrenal gland is within normal limits. The kidneys enhance symmetrically with multifocal renal cortical thinning or scarring. Stable cysts and hypodensities are present within the kidneys bilaterally. No renal calculus or hydronephrosis. The bladder is unremarkable. Stomach/Bowel: Stomach is within normal limits. Appendix appears normal. No evidence of bowel wall thickening, distention, or inflammatory changes. No free air or pneumatosis. A few scattered diverticula are present along the colon without evidence of diverticulitis. Vascular/Lymphatic: Aortic atherosclerosis. No enlarged abdominal or pelvic lymph nodes. Reproductive: Status post hysterectomy. No adnexal masses. Other: A trace amount of free fluid is noted in the pelvis. 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 Musculoskeletal: No acute or suspicious osseous abnormality. IMPRESSION: 1. Necrotic right upper lobe mass, increased in size from the prior exam with extension into the chest wall musculature on the right with erosions and destructive changes of the T3 and T4 ribs on the right. 2. Small right pleural effusion with atelectasis. 3. Stable spiculated opacities in the left upper lobe with a few surrounding satellite nodules and new pulmonary nodules in the left lower lobe, suspicious for metastasis. 4. No acute  process in the abdomen and pelvis. 5. Coronary artery calcifications and aortic atherosclerosis. Electronically Signed   By: Thornell Sartorius M.D.   On: 12/05/2022 20:44   DG Chest 2 View  Result Date: 12/05/2022 CLINICAL DATA:  Lung cancer.  Shortness of breath EXAM: CHEST - 2 VIEW COMPARISON:  X-ray 10/15/2022.  PET-CT scan 11/18/2022. FINDINGS: Large right upper lobe mass is increasing in size compared to the prior x-ray. Please correlate with more recent PET-CT. There is a small right effusion which is increased as well. Left lung is grossly clear without pneumothorax or effusion. No left-sided consolidation. Stable cardiopericardial silhouette without edema. Overlapping cardiac leads. Film is rotated to the right. IMPRESSION: Increasing right-sided lung mass and right-sided pleural effusion. Please correlate with recent PET-CT Electronically Signed   By: Karen Kays M.D.   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 Appears to have some post-obstructive pneumonia on CTPA. No PE. Start IV zosyn. I think her overall prognosis is poor.  Cancer related pain Pt states that vicodin 5/325 was prescribed q6h prn. Not lasting the full 6 hours.  Was taking it every 4 hours prn. Oncology changed pain meds to morphine. Pt and dtr states that morphine did not work. Ok to use vicodin 5/325 q4h prn. That would be less than 2000 mg tylenol per day.  Acute respiratory failure with hypoxia (HCC) Continue with supplemental O2. Will likely need home O2. Home O2  evaluation.  HTN (hypertension), benign Stable. Continue toprol-xl 50 mg daily.  COPD GOLD II Stable. Continue inhalers.  Asymptomatic bacteriuria Pt without any urinary symptoms including dysuria, urinary frequency, urinary urgency or flank pain.  DNR (do not resuscitate)/DNI(Do Not Intubate) Verified DNR/DNI status with pt. Dtr [name] at bedside and witnessed.  Pt will need yellow DNR form completed and sent home with her at discharge. Please take a picture of DNR form and file it under Media tab for future use.   DVT prophylaxis: SQ Heparin Code Status: DNR/DNI(Do NOT Intubate).verified with pt. Witnessed by dtr [name] Family Communication: discussed with pt and dtr rhonda at bedside  Disposition Plan: return home  Consults called: none  Admission status: Observation, Med-Surg   Carollee Herter, DO [facility] 12/05/2022, 10:27 PM

## 2022-12-06 ENCOUNTER — Other Ambulatory Visit: Payer: Self-pay

## 2022-12-06 DIAGNOSIS — J449 Chronic obstructive pulmonary disease, unspecified: Secondary | ICD-10-CM

## 2022-12-06 DIAGNOSIS — Z66 Do not resuscitate: Secondary | ICD-10-CM | POA: Diagnosis not present

## 2022-12-06 DIAGNOSIS — I252 Old myocardial infarction: Secondary | ICD-10-CM | POA: Diagnosis not present

## 2022-12-06 DIAGNOSIS — K859 Acute pancreatitis without necrosis or infection, unspecified: Secondary | ICD-10-CM | POA: Diagnosis present

## 2022-12-06 DIAGNOSIS — K8020 Calculus of gallbladder without cholecystitis without obstruction: Secondary | ICD-10-CM | POA: Diagnosis not present

## 2022-12-06 DIAGNOSIS — J439 Emphysema, unspecified: Secondary | ICD-10-CM | POA: Diagnosis present

## 2022-12-06 DIAGNOSIS — G893 Neoplasm related pain (acute) (chronic): Secondary | ICD-10-CM

## 2022-12-06 DIAGNOSIS — J9601 Acute respiratory failure with hypoxia: Secondary | ICD-10-CM | POA: Diagnosis not present

## 2022-12-06 DIAGNOSIS — Z515 Encounter for palliative care: Secondary | ICD-10-CM

## 2022-12-06 DIAGNOSIS — I2510 Atherosclerotic heart disease of native coronary artery without angina pectoris: Secondary | ICD-10-CM | POA: Diagnosis not present

## 2022-12-06 DIAGNOSIS — I5032 Chronic diastolic (congestive) heart failure: Secondary | ICD-10-CM | POA: Diagnosis present

## 2022-12-06 DIAGNOSIS — Z7401 Bed confinement status: Secondary | ICD-10-CM | POA: Diagnosis not present

## 2022-12-06 DIAGNOSIS — R609 Edema, unspecified: Secondary | ICD-10-CM | POA: Diagnosis not present

## 2022-12-06 DIAGNOSIS — D649 Anemia, unspecified: Secondary | ICD-10-CM | POA: Diagnosis not present

## 2022-12-06 DIAGNOSIS — Z7189 Other specified counseling: Secondary | ICD-10-CM | POA: Diagnosis not present

## 2022-12-06 DIAGNOSIS — Z79899 Other long term (current) drug therapy: Secondary | ICD-10-CM

## 2022-12-06 DIAGNOSIS — R5383 Other fatigue: Secondary | ICD-10-CM | POA: Diagnosis not present

## 2022-12-06 DIAGNOSIS — K861 Other chronic pancreatitis: Secondary | ICD-10-CM | POA: Diagnosis not present

## 2022-12-06 DIAGNOSIS — E871 Hypo-osmolality and hyponatremia: Secondary | ICD-10-CM | POA: Diagnosis present

## 2022-12-06 DIAGNOSIS — C7951 Secondary malignant neoplasm of bone: Secondary | ICD-10-CM | POA: Diagnosis present

## 2022-12-06 DIAGNOSIS — R0902 Hypoxemia: Secondary | ICD-10-CM | POA: Diagnosis not present

## 2022-12-06 DIAGNOSIS — J90 Pleural effusion, not elsewhere classified: Secondary | ICD-10-CM | POA: Diagnosis not present

## 2022-12-06 DIAGNOSIS — C3490 Malignant neoplasm of unspecified part of unspecified bronchus or lung: Secondary | ICD-10-CM | POA: Diagnosis not present

## 2022-12-06 DIAGNOSIS — E440 Moderate protein-calorie malnutrition: Secondary | ICD-10-CM | POA: Diagnosis not present

## 2022-12-06 DIAGNOSIS — R059 Cough, unspecified: Secondary | ICD-10-CM | POA: Diagnosis not present

## 2022-12-06 DIAGNOSIS — Z510 Encounter for antineoplastic radiation therapy: Secondary | ICD-10-CM | POA: Diagnosis not present

## 2022-12-06 DIAGNOSIS — N289 Disorder of kidney and ureter, unspecified: Secondary | ICD-10-CM | POA: Diagnosis not present

## 2022-12-06 DIAGNOSIS — I110 Hypertensive heart disease with heart failure: Secondary | ICD-10-CM | POA: Diagnosis present

## 2022-12-06 DIAGNOSIS — E669 Obesity, unspecified: Secondary | ICD-10-CM | POA: Diagnosis present

## 2022-12-06 DIAGNOSIS — Z888 Allergy status to other drugs, medicaments and biological substances status: Secondary | ICD-10-CM | POA: Diagnosis not present

## 2022-12-06 DIAGNOSIS — Z87891 Personal history of nicotine dependence: Secondary | ICD-10-CM | POA: Diagnosis not present

## 2022-12-06 DIAGNOSIS — Z85820 Personal history of malignant melanoma of skin: Secondary | ICD-10-CM | POA: Diagnosis not present

## 2022-12-06 DIAGNOSIS — C3411 Malignant neoplasm of upper lobe, right bronchus or lung: Secondary | ICD-10-CM | POA: Diagnosis not present

## 2022-12-06 DIAGNOSIS — R0600 Dyspnea, unspecified: Secondary | ICD-10-CM | POA: Diagnosis not present

## 2022-12-06 DIAGNOSIS — I5020 Unspecified systolic (congestive) heart failure: Secondary | ICD-10-CM | POA: Diagnosis not present

## 2022-12-06 DIAGNOSIS — J441 Chronic obstructive pulmonary disease with (acute) exacerbation: Secondary | ICD-10-CM | POA: Diagnosis not present

## 2022-12-06 DIAGNOSIS — I10 Essential (primary) hypertension: Secondary | ICD-10-CM | POA: Diagnosis not present

## 2022-12-06 DIAGNOSIS — E785 Hyperlipidemia, unspecified: Secondary | ICD-10-CM | POA: Diagnosis present

## 2022-12-06 DIAGNOSIS — C3491 Malignant neoplasm of unspecified part of right bronchus or lung: Secondary | ICD-10-CM | POA: Diagnosis not present

## 2022-12-06 DIAGNOSIS — Z743 Need for continuous supervision: Secondary | ICD-10-CM | POA: Diagnosis not present

## 2022-12-06 DIAGNOSIS — Z923 Personal history of irradiation: Secondary | ICD-10-CM | POA: Diagnosis not present

## 2022-12-06 LAB — PROCALCITONIN: Procalcitonin: <0.1 ng/mL

## 2022-12-06 LAB — COMPREHENSIVE METABOLIC PANEL
ALT: 10 U/L (ref 0–44)
AST: 22 U/L (ref 15–41)
Albumin: 1.8 g/dL — ABNORMAL LOW (ref 3.5–5.0)
Alkaline Phosphatase: 45 U/L (ref 38–126)
Anion gap: 7 (ref 5–15)
BUN: 9 mg/dL (ref 8–23)
CO2: 29 mmol/L (ref 22–32)
Calcium: 9.9 mg/dL (ref 8.9–10.3)
Chloride: 103 mmol/L (ref 98–111)
Creatinine, Ser: 0.59 mg/dL (ref 0.44–1.00)
GFR, Estimated: >60 mL/min (ref >60)
Glucose, Bld: 103 mg/dL — ABNORMAL HIGH (ref 70–99)
Potassium: 3.6 mmol/L (ref 3.5–5.1)
Sodium: 139 mmol/L (ref 135–145)
Total Bilirubin: 0.4 mg/dL (ref 0.3–1.2)
Total Protein: 5.4 g/dL — ABNORMAL LOW (ref 6.5–8.1)

## 2022-12-06 LAB — CBC WITH DIFFERENTIAL/PLATELET
Abs Immature Granulocytes: 0.1 10*3/uL — ABNORMAL HIGH (ref 0.00–0.07)
Basophils Absolute: 0.1 10*3/uL (ref 0.0–0.1)
Basophils Relative: 0 %
Eosinophils Absolute: 0.4 10*3/uL (ref 0.0–0.5)
Eosinophils Relative: 2 %
HCT: 32.5 % — ABNORMAL LOW (ref 36.0–46.0)
Hemoglobin: 9.4 g/dL — ABNORMAL LOW (ref 12.0–15.0)
Immature Granulocytes: 1 %
Lymphocytes Relative: 7 %
Lymphs Abs: 1.3 10*3/uL (ref 0.7–4.0)
MCH: 24.3 pg — ABNORMAL LOW (ref 26.0–34.0)
MCHC: 28.9 g/dL — ABNORMAL LOW (ref 30.0–36.0)
MCV: 84 fL (ref 80.0–100.0)
Monocytes Absolute: 2.1 10*3/uL — ABNORMAL HIGH (ref 0.1–1.0)
Monocytes Relative: 12 %
Neutro Abs: 13.6 10*3/uL — ABNORMAL HIGH (ref 1.7–7.7)
Neutrophils Relative %: 78 %
Platelets: 609 10*3/uL — ABNORMAL HIGH (ref 150–400)
RBC: 3.87 MIL/uL (ref 3.87–5.11)
RDW: 17.2 % — ABNORMAL HIGH (ref 11.5–15.5)
WBC: 17.5 10*3/uL — ABNORMAL HIGH (ref 4.0–10.5)
nRBC: 0 % (ref 0.0–0.2)

## 2022-12-06 LAB — URINE CULTURE

## 2022-12-06 LAB — MAGNESIUM: Magnesium: 1.6 mg/dL — ABNORMAL LOW (ref 1.7–2.4)

## 2022-12-06 MED ORDER — LIDOCAINE 5 % EX PTCH
1.0000 | MEDICATED_PATCH | CUTANEOUS | Status: DC
  Administered 2022-12-07 – 2022-12-23 (×16): 1 via TRANSDERMAL
  Filled 2022-12-06 (×19): qty 1

## 2022-12-06 MED ORDER — MORPHINE SULFATE ER 15 MG PO TBCR
15.0000 mg | EXTENDED_RELEASE_TABLET | Freq: Two times a day (BID) | ORAL | Status: DC
  Administered 2022-12-06 – 2022-12-24 (×37): 15 mg via ORAL
  Filled 2022-12-06 (×37): qty 1

## 2022-12-06 MED ORDER — POLYETHYLENE GLYCOL 3350 17 G PO PACK
17.0000 g | PACK | Freq: Every day | ORAL | Status: DC
  Administered 2022-12-06 – 2022-12-10 (×5): 17 g via ORAL
  Filled 2022-12-06 (×5): qty 1

## 2022-12-06 MED ORDER — OXYCODONE HCL 5 MG PO TABS
2.5000 mg | ORAL_TABLET | ORAL | Status: DC | PRN
  Administered 2022-12-06 – 2022-12-08 (×6): 2.5 mg via ORAL
  Filled 2022-12-06 (×7): qty 1

## 2022-12-06 MED ORDER — ACETAMINOPHEN 500 MG PO TABS
1000.0000 mg | ORAL_TABLET | Freq: Three times a day (TID) | ORAL | Status: DC
  Administered 2022-12-06 – 2022-12-12 (×19): 1000 mg via ORAL
  Filled 2022-12-06 (×19): qty 2

## 2022-12-06 MED ORDER — MAGNESIUM SULFATE 2 GM/50ML IV SOLN
2.0000 g | Freq: Once | INTRAVENOUS | Status: AC
  Administered 2022-12-06: 2 g via INTRAVENOUS
  Filled 2022-12-06: qty 50

## 2022-12-06 MED ORDER — SENNA 8.6 MG PO TABS
1.0000 | ORAL_TABLET | Freq: Every day | ORAL | Status: DC
  Administered 2022-12-06 – 2022-12-10 (×5): 8.6 mg via ORAL
  Filled 2022-12-06 (×5): qty 1

## 2022-12-06 NOTE — Consult Note (Signed)
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  Patient is followed by cancer center in [facility] and is scheduled to undergo radiation on 5/15.  Patient also receiving management for electrolyte imbalance .  Medicine team consulted to assist with management.  Reviewed EMR prior to presenting to bedside.  At time of EMR patient has been receiving Vicodin 5 mg every 4 hours as needed x 1 dose and has been scheduled MS Contin 15 mg every 12 hours.  Patient also has Xanax 0.25 mg nightly.  Presented to bedside to meet with patient.  Patient seen laying comfortably in bed.  Patient's daughter, [name], present at bedside.  Able to introduce myself and the role of the palliative medicine team.  Spent time reviewing patient's medical history and diagnosis up until this point.  Then spent time reviewing patient's pain.  Patient describes pain as being located in her right ribs, back, and shoulder.  Patient and daughter note that patient was frequently needing short acting medication at home.  They had recently been prescribed short acting morphine were taking 7.5 mg every 4 hours patient then became confused and it made her situation worse.  Discontinuing short acting morphine, patient notes improvement in mentation.  Patient and daughter were okay with patient receiving long-acting morphine this morning knowing that would not cause short acting medication effects.  Agreed with this and patient will  continue on MS Contin 15 mg every 12 hours scheduled to assist with long-acting management of pain as patient's pain currently improved likely because of the starting of this medication.  Patient and daughter do not want patient to resume short acting morphine.  Discussed instead starting low-dose oxycodone every 4 hours as needed and Titrate accordingly.  Patient and daughter agreeing with this.  Also discussed scheduling Tylenol 1000 mg 3 times daily and lidocaine patch.  Also reviewed potential of starting steroid to assist with bone pain management though patient notes that previously when she received prednisone she went "crazy".  So noted would hold off on steroids at this point though may be needed in the future as can greatly assist with bone pain.  Discussed patient's bowel movements.  Patient's last bowel movement was yesterday.  Patient states normal constipation at home.  Discussed importance of bowel movements while on opioid regimen.  Discussed starting MiraLAX and senna daily.  Discussed with patient and daughter follow-up at the cancer center here at [facility] long for outpatient palliative management to continue with symptom control moving forward.  Patient and daughter agreeing with this.  All questions answered at that time.  Thanked patient and daughter for allowing me to meet with them today.  Discussed care with care team after visit. Primary Diagnoses  Present on Admission:  Stage IV squamous cell carcinoma of right lung (HCC)  HTN (hypertension), benign  COPD GOLD II  Cancer related pain  Acute respiratory failure with hypoxia (HCC)   Palliative Review of Systems: Pain in bones including back, shoulder, and ribs  Past Medical History:  Diagnosis Date   Allergy    Anemia    Anxiety    Arthritis    Cancer (HCC)    hx of melanoma    Colon polyps    COPD (chronic obstructive pulmonary disease) (HCC)    Coronary artery disease    Dyspnea    with exertion    Edema     in lower extremties    Emphysema of lung (HCC)    GERD (gastroesophageal reflux disease)    H/O hiatal hernia    Hyperlipidemia    Hypertension    Inverted nipple    bilateral, pt says that's normal   Lung cancer (HCC) 10/23/2022   Malignant melanoma (HCC) 1983   again in 1995]   Myocardial infarct, old 2008   no stent placed   Osteoporosis    Pneumonia    hx of    Sepsis (HCC)    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    Stroke [facility])    TIA   Social History   Socioeconomic History   Marital status: Widowed    Spouse name: Not on file   Number of children: 3   Years of education: Not on file   Highest education level: 12th grade  Occupational History   Not on file  Tobacco Use   Smoking status: Former    Packs/day: 1.50    Years: 44.00    Additional pack years: 0.00    Total pack years: 66.00    Types: Cigarettes    Quit date: 07/28/2005    Years since quitting: 17.3   Smokeless tobacco: Never  Vaping Use   Vaping Use: Never used  Substance and Sexual Activity   Alcohol use: Not Currently    Alcohol/week: 2.0 standard drinks of alcohol    Types: 2 Shots of liquor per week   Drug use: No   Sexual activity: Not Currently  Other Topics Concern   Not on file  Social History Narrative   Right handed   Caffeine 64 0z a day   Home is one story    Lives with daughter   Retired.   Social Determinants of Health   Financial Resource Strain: Low Risk  (11/01/2022)   Overall Financial Resource Strain (CARDIA)    Difficulty of Paying Living Expenses: Not hard at all  Food Insecurity: No Food Insecurity (12/06/2022)   Hunger Vital Sign    Worried About Running Out of Food in the Last Year: Never true    Ran Out of Food in the Last Year: Never true  Transportation Needs: No Transportation Needs (12/06/2022)   PRAPARE - Administrator, Civil Service (Medical): No    Lack of Transportation (Non-Medical): No   Physical Activity: Sufficiently Active (11/01/2022)   Exercise Vital Sign    Days of Exercise per Week: 5 days    Minutes of Exercise per Session: 30 min  Stress: No Stress Concern Present (11/01/2022)   [employer] of Occupational Health - Occupational Stress Questionnaire    Feeling of Stress : Not at all  Social Connections: Unknown (11/01/2022)   Social Connection and Isolation Panel [NHANES]    Frequency of Communication with Friends and Family: More than three times a week    Frequency of Social Gatherings with Friends and Family: Patient declined    Attends Religious Services: Patient declined    Database administrator or Organizations: No  Attends Banker Meetings: Not on file    Marital Status: Widowed   Family History  Problem Relation Age of Onset   Arthritis Mother    Hearing loss Father    Colon cancer Brother    Hearing loss Maternal Grandmother    Cancer Maternal Grandmother        colon   Cancer Paternal Grandmother        colon   Hearing loss Maternal Aunt    Breast cancer Neg Hx    Scheduled Meds:  ALPRAZolam  0.25 mg Oral QHS   heparin  5,000 Units Subcutaneous Q8H   metoprolol succinate  50 mg Oral Daily   mometasone-formoterol  2 puff Inhalation BID   morphine  15 mg Oral Q12H   pantoprazole  40 mg Oral Daily   primidone  50 mg Oral QHS   Continuous Infusions:  piperacillin-tazobactam (ZOSYN)  IV 3.375 g (12/06/22 0605)   PRN Meds:.acetaminophen **OR** acetaminophen, HYDROcodone-acetaminophen, ipratropium-albuterol, melatonin, ondansetron **OR** ondansetron (ZOFRAN) IV Allergies  Allergen Reactions   Prednisone Other (See Comments)    Leg pain,light headed   Statins     Myalgias- per patient, she feels better off the medication   CBC:    Component Value Date/Time   WBC 17.5 (H) 12/06/2022 0622   HGB 9.4 (L) 12/06/2022 0622   HGB 10.4 (L) 11/04/2022 0915   HCT 32.5 (L) 12/06/2022 0622   PLT 609 (H) 12/06/2022 0622   PLT 705  (H) 11/04/2022 0915   MCV 84.0 12/06/2022 0622   NEUTROABS 13.6 (H) 12/06/2022 0622   NEUTROABS 9 12/01/2016 0000   LYMPHSABS 1.3 12/06/2022 0622   MONOABS 2.1 (H) 12/06/2022 0622   EOSABS 0.4 12/06/2022 0622   BASOSABS 0.1 12/06/2022 0622   Comprehensive Metabolic Panel:    Component Value Date/Time   NA 139 12/06/2022 0622   NA 144 12/01/2016 0000   K 3.6 12/06/2022 0622   CL 103 12/06/2022 0622   CO2 29 12/06/2022 0622   BUN 9 12/06/2022 0622   BUN 20 12/01/2016 0000   CREATININE 0.59 12/06/2022 0622   CREATININE 0.77 11/04/2022 0915   GLUCOSE 103 (H) 12/06/2022 0622   CALCIUM 9.9 12/06/2022 0622   AST 22 12/06/2022 0622   AST 13 (L) 11/04/2022 0915   ALT 10 12/06/2022 0622   ALT 6 11/04/2022 0915   ALKPHOS 45 12/06/2022 0622   BILITOT 0.4 12/06/2022 0622   BILITOT 0.4 11/04/2022 0915   PROT 5.4 (L) 12/06/2022 0622   ALBUMIN 1.8 (L) 12/06/2022 0622    Physical Exam: Vital Signs: BP (!) 177/58 (BP Location: Left Arm)   Pulse 94   Temp 97.8 F (36.6 C) (Oral)   Resp 20   Ht 5' (1.524 m)   Wt 64.9 kg   SpO2 96%   BMI 27.94 kg/m  SpO2: SpO2: 96 % O2 Device: O2 Device: Nasal Cannula O2 Flow Rate: O2 Flow Rate (L/min): 1 L/min Intake/output summary: No intake or output data in the 24 hours ending 12/06/22 1302 LBM: Last BM Date : 12/03/22 Baseline Weight: Weight: 65.6 kg Most recent weight: Weight: 64.9 kg  General: NAD, alert, laying in bed, hard of hearing, elderly appearing Eyes: No drainage noted HENT: moist mucous membranes Cardiovascular: RRR Respiratory: no increased work of breathing noted, not in respiratory distress Abdomen: not distended Neuro: following commands easily and properly interacting with questions, hard of hearing Psych: appropriately answers all questions  Palliative Performance Scale: 60%              Additional Data Reviewed: Recent Labs    12/05/22 1439 12/06/22 0622  WBC 20.3* 17.5*  HGB 9.3* 9.4*  PLT 738* 609*   NA 138 139  BUN 10 9  CREATININE 0.62 0.59    Imaging: CT CHEST ABDOMEN PELVIS W CONTRAST CLINICAL DATA:  Sepsis, history of pulmonary malignancy. Shortness of breath, nausea and vomiting.  EXAM: CT CHEST, ABDOMEN, AND PELVIS WITH CONTRAST  TECHNIQUE: Multidetector CT imaging of the chest, abdomen and pelvis was performed following the standard protocol during bolus administration of intravenous contrast.  RADIATION DOSE REDUCTION: This exam was performed according to the departmental dose-optimization program which includes automated exposure control, adjustment of the mA and/or kV according to patient size and/or use of iterative reconstruction technique.  CONTRAST:  OMNIPAQUE IOHEXOL 300 MG/ML  SOLN  COMPARISON:  02/13/2021, 11/18/2022, 10/23/2022.  FINDINGS: CT CHEST FINDINGS  Cardiovascular: Heart is enlarged and there is a trace pericardial effusion. Few scattered coronary artery calcifications are noted. There is atherosclerotic calcification of the aorta without evidence of aneurysm. The pulmonary trunk is normal in caliber.  Mediastinum/Nodes: Enlarged lymph nodes are present in the mediastinum measuring up to 1 cm in the right paratracheal space there suspected enlarged lymph nodes at the right hilum measuring up to 1.5 cm. No left hilar or axillary lymphadenopathy. The thyroid gland, trachea, and esophagus are within normal limits.  Lungs/Pleura: Paraseptal and centrilobular emphysematous changes are present in the lungs. There is a small loculated pleural effusion on the right. There is redemonstration of a right upper lobe mass with central necrosis and extension of tumor into the chest wall musculature and soft tissues on the right. Atelectasis is present at the right lung base. Spiculated opacities are noted in the left upper lobe with a few satellite nodules and not significantly changed from the prior exam. Few pulmonary nodules are noted in  the left lower lobe, the largest measuring 5 mm, axial image 93, new from the previous exam.  Musculoskeletal: There are erosions and bony destruction involving the T3 and T4 ribs on the right. No acute fracture is seen.  CT ABDOMEN PELVIS FINDINGS  Hepatobiliary: No focal liver abnormality is seen. Status post cholecystectomy. No biliary dilatation.  Pancreas: Unremarkable. No pancreatic ductal dilatation or surrounding inflammatory changes.  Spleen: Normal in size without focal abnormality.  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 The left adrenal gland is within normal limits. The kidneys enhance symmetrically with multifocal renal cortical thinning or scarring. Stable cysts and hypodensities are present within the kidneys bilaterally. No renal calculus or hydronephrosis. The bladder is unremarkable.  Stomach/Bowel: Stomach is within normal limits. Appendix appears normal. No evidence of bowel wall thickening, distention, or inflammatory changes. No free air or pneumatosis. A few scattered diverticula are present along the colon without evidence of diverticulitis.  Vascular/Lymphatic: Aortic atherosclerosis. No enlarged abdominal or pelvic lymph nodes.  Reproductive: Status post hysterectomy. No adnexal masses.  Other: A trace amount of free fluid is noted in the pelvis. 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  Musculoskeletal: No acute or suspicious osseous abnormality.  IMPRESSION: 1. Necrotic right upper lobe mass, increased in size from the prior exam with extension into the chest wall musculature on the right with erosions and  destructive changes of the T3 and T4 ribs on the right. 2. Small right pleural effusion with atelectasis. 3. Stable spiculated opacities in the left upper lobe  with a few surrounding satellite nodules and new pulmonary nodules in the left lower lobe, suspicious for metastasis. 4. No acute process in the abdomen and pelvis. 5. Coronary artery calcifications and aortic atherosclerosis.  Electronically Signed   By: Thornell Sartorius M.D.   On: 12/05/2022 20:44 DG Chest 2 View CLINICAL DATA:  Lung cancer.  Shortness of breath  EXAM: CHEST - 2 VIEW  COMPARISON:  X-ray 10/15/2022.  PET-CT scan 11/18/2022.  FINDINGS: Large right upper lobe mass is increasing in size compared to the prior x-ray. Please correlate with more recent PET-CT. There is a small right effusion which is increased as well. Left lung is grossly clear without pneumothorax or effusion. No left-sided consolidation. Stable cardiopericardial silhouette without edema. Overlapping cardiac leads. Film is rotated to the right.  IMPRESSION: Increasing right-sided lung mass and right-sided pleural effusion. Please correlate with recent PET-CT  Electronically Signed   By: Karen Kays M.D.   On: 12/05/2022 15:18    I personally reviewed recent imaging.   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  Patient is followed by cancer center in [facility] and is scheduled to undergo radiation on 5/15.  Patient also receiving management for electrolyte imbalance .  Medicine team consulted to assist with management.  # Complex medical decision making/goals of care  -Patient continuing with appropraite medical therapies to address her underlying medical conditions including  cancer directed therapies. Patient already DNR.   -  Code Status: DNR   # Symptom  management  -Pain, in setting of metastatic lung cancer to bones   -Stop Vicodin   -Continue MS Contin 15 mg every 12 hours scheduled   -Start oxycodone 2.5 mg every 4 hours as needed.  May need to consider further titration to 5 mg if 2.5 mg dose is not enough.   -Start Tylenol 1000 mg scheduled every 8 hours during the day.   -Start lidocaine patch for right-sided ribs.   -Considered steroids for bone pain though patient notes prednisone made her "crazy" previously.    -Constipation States issues of constipation at home.  Patient's last bowel movement was yesterday.  Discussed importance of bowel regimen while receiving opioids.   -Start MiraLAX 17 g daily   -Start Senna 1 tab daily.  # Psycho-social/Spiritual Support:  - Support System: daughters  # Discharge Planning:  TBD. Needs follow up with PMT at [facility] to continue symptom management moving forward.   Thank you for allowing the palliative care team to participate in the care [facility].  Alvester Morin, DO Palliative Care Provider PMT # [phone]  If patient remains symptomatic despite maximum doses, please call PMT at [phone] between 0700 and 1900. Outside of these hours, please call attending, as PMT does not have night coverage.  This provider spent a total of 84 minutes providing patient's care.  Includes review of EMR, discussing care with other staff members involved in patient's medical care, obtaining relevant history and information from patient and/or patient's family, and personal review of imaging and lab work.  Greater than 50% of the time was spent counseling and coordinating care related to the above assessment and plan.  '  *Please note that this is a verbal dictation therefore any spelling or grammatical errors are due to the "[employer]" system interpretation.

## 2022-12-06 NOTE — Progress Notes (Signed)
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  She was recently started on Vicodin and that was not sufficient.  Over the last 3 days she had been without any pain medication having increasing pain and shortness of breath so presented to the ER. Hemodynamically stable in the ER.  CT angio of the chest abdomen pelvis with necrotic right upper lung mass increased in size from recent exam with bony destruction of the third and fourth rib on the right side, multiple pulmonary nodules and also metastatic lesion on the left lung.  Admitted due to unrelenting cancer related pain.   Assessment & Plan:   Stage IV lung cancer, cancer related pain uncontrolled with oral regimen at home. Currently hemodynamically stable. Patient on short acting pain medication with recent changes to 15 mg of morphine as needed.  This is very painful condition with infiltration to the ribs. Started on MS Contin 15 mg twice daily, continue Norco 5/325 every 4 hours as needed.  Will continue to escalate pain medication.  She may benefit with fentanyl patch for around-the-clock pain control. Given severity of symptoms, will ask palliative care team to help with cancer related pain management. Continue laxative regimen to avoid constipation.  Stage IV lung cancer: Followed by cancer center in [place].  Is scheduled for Port-A-Cath on 5/13.  Scheduled to have radiation to the right lung 5/15.  She may continue outpatient follow-up once pain control achieved.  Hypomagnesemia: Will replace.  Leukocytosis: Will monitor.  There is no evidence of bacterial infection.  Suspected pneumonia: There is no evidence of pneumonia on CT scan.  No clinical  evidence of bacterial infection.  Discontinue antibiotics.  Chronic medical issues including COPD, stable Hypertension, stable on Toprol.    DVT prophylaxis: heparin injection 5,000 Units Start: 12/06/22 0600 SCDs Start: 12/05/22 2355   Code Status: DNR, documented in the chart Family Communication: None at the bedside Disposition Plan: Status is: Inpatient Remains inpatient appropriate because: Significant cancer related pain     Consultants:  Palliative care  Procedures:  None  Antimicrobials:  Vancomycin and Zosyn, discontinued.   Subjective: Patient seen and examined.  She got some relief with every 4 hours Norco.  Still has episodic pain mostly in the right lateral chest wall. "Pharmacy may not give me more than 1 type of narcotics pain medication".  Denies any shortness of breath at rest.  Her shortness of breath is mostly due to pain and splinting of the right chest.  Without any cough wheezing or sputum production.  Remains afebrile.  Objective: Vitals:   12/06/22 0415 12/06/22 0801 12/06/22 0845 12/06/22 1254  BP: (!) 133/41 (!) 154/60  (!) 177/58  Pulse: 78 96  94  Resp: 18 20    Temp: 97.6 F (36.4 C) 97.6 F (36.4 C)  97.8 F (36.6 C)  TempSrc: Oral Oral  Oral  SpO2: 100% 100% 94% 96%  Weight:      Height:       No intake or output data in the 24 hours ending 12/06/22 1435 Filed Weights   12/05/22 2356 12/06/22 0106  Weight: 65.6 kg 64.9 kg    Examination:  General exam: Appears calm and comfortable  Hard of hearing.  Able to talk in complete sentences. Respiratory system: Mostly clear.  No added sounds.  On 1 L of oxygen. Cardiovascular system: S1 & S2 heard, RRR. No pedal edema. Gastrointestinal system: Soft.  Nontender.  Bowel sound present. Central nervous system: Alert and oriented. No focal neurological deficits. Extremities: Symmetric 5 x 5 power. Skin: No rashes, lesions or ulcers Psychiatry: Judgement and insight appear normal. Mood  & affect appropriate.     Data Reviewed: I have personally reviewed following labs and imaging studies  CBC: Recent Labs  Lab 12/05/22 1439 12/06/22 0622  WBC 20.3* 17.5*  NEUTROABS 17.1* 13.6*  HGB 9.3* 9.4*  HCT 31.2* 32.5*  MCV 82.1 84.0  PLT 738* 609*   Basic Metabolic Panel: Recent Labs  Lab 12/05/22 1439 12/06/22 0622  NA 138 139  K 3.4* 3.6  CL 101 103  CO2 31 29  GLUCOSE 125* 103*  BUN 10 9  CREATININE 0.62 0.59  CALCIUM 10.4* 9.9  MG  --  1.6*   GFR: Estimated Creatinine Clearance: 46.4 mL/min (by C-G formula based on SCr of 0.59 mg/dL). Liver Function Tests: Recent Labs  Lab 12/05/22 1439 12/06/22 0622  AST 18 22  ALT 10 10  ALKPHOS 48 45  BILITOT 0.6 0.4  PROT 5.7* 5.4*  ALBUMIN 1.8* 1.8*   Recent Labs  Lab 12/05/22 1439  LIPASE 22   No results for input(s): "AMMONIA" in the last 168 hours. Coagulation Profile: No results for input(s): "INR", "PROTIME" in the last 168 hours. Cardiac Enzymes: No results for input(s): "CKTOTAL", "CKMB", "CKMBINDEX", "TROPONINI" in the last 168 hours. BNP (last 3 results) No results for input(s): "PROBNP" in the last 8760 hours. HbA1C: No results for input(s): "HGBA1C" in the last 72 hours. CBG: No results for input(s): "GLUCAP" in the last 168 hours. Lipid Profile: No results for input(s): "CHOL", "HDL", "LDLCALC", "TRIG", "CHOLHDL", "LDLDIRECT" in the last 72 hours. Thyroid Function Tests: No results for input(s): "TSH", "T4TOTAL", "FREET4", "T3FREE", "THYROIDAB" in the last 72 hours. Anemia Panel: No results for input(s): "VITAMINB12", "FOLATE", "FERRITIN", "TIBC", "IRON", "RETICCTPCT" in the last 72 hours. Sepsis Labs: Recent Labs  Lab 12/06/22 0622  PROCALCITON <0.10    No results found for this or any previous visit (from the past 240 hour(s)).       Radiology Studies: CT CHEST ABDOMEN PELVIS W CONTRAST  Result Date: 12/05/2022 CLINICAL DATA:  Sepsis, history of pulmonary malignancy.  Shortness of breath, nausea and vomiting. EXAM: CT CHEST, ABDOMEN, AND PELVIS WITH CONTRAST TECHNIQUE: Multidetector CT imaging of the chest, abdomen and pelvis was performed following the standard protocol during bolus administration of intravenous contrast. RADIATION DOSE REDUCTION: This exam was performed according to the departmental dose-optimization program which includes automated exposure control, adjustment of the mA and/or kV according to patient size and/or use of iterative reconstruction technique. CONTRAST:  OMNIPAQUE IOHEXOL 300 MG/ML  SOLN COMPARISON:  02/13/2021, 11/18/2022, 10/23/2022. FINDINGS: CT CHEST FINDINGS Cardiovascular: Heart is enlarged and there is a trace pericardial effusion. Few scattered coronary artery calcifications are noted. There is atherosclerotic calcification of the aorta without evidence of aneurysm. The pulmonary trunk is normal in caliber. Mediastinum/Nodes: Enlarged lymph nodes are present in the mediastinum measuring up to 1 cm in the right paratracheal space there suspected enlarged lymph nodes at the right hilum measuring up to 1.5 cm. No left hilar or axillary lymphadenopathy. The thyroid gland, trachea, and esophagus are within normal limits. Lungs/Pleura: Paraseptal and centrilobular emphysematous changes are present in the  lungs. There is a small loculated pleural effusion on the right. There is redemonstration of a right upper lobe mass with central necrosis and extension of tumor into the chest wall musculature and soft tissues on the right. Atelectasis is present at the right lung base. Spiculated opacities are noted in the left upper lobe with a few satellite nodules and not significantly changed from the prior exam. Few pulmonary nodules are noted in the left lower lobe, the largest measuring 5 mm, axial image 93, new from the previous exam. Musculoskeletal: There are erosions and bony destruction involving the T3 and T4 ribs on the right. No acute  fracture is seen. CT ABDOMEN PELVIS FINDINGS Hepatobiliary: No focal liver abnormality is seen. Status post cholecystectomy. No biliary dilatation. Pancreas: Unremarkable. No pancreatic ductal dilatation or surrounding inflammatory changes. Spleen: Normal in size without focal abnormality. 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 The left adrenal gland is within normal limits. The kidneys enhance symmetrically with multifocal renal cortical thinning or scarring. Stable cysts and hypodensities are present within the kidneys bilaterally. No renal calculus or hydronephrosis. The bladder is unremarkable. Stomach/Bowel: Stomach is within normal limits. Appendix appears normal. No evidence of bowel wall thickening, distention, or inflammatory changes. No free air or pneumatosis. A few scattered diverticula are present along the colon without evidence of diverticulitis. Vascular/Lymphatic: Aortic atherosclerosis. No enlarged abdominal or pelvic lymph nodes. Reproductive: Status post hysterectomy. No adnexal masses. Other: A trace amount of free fluid is noted in the pelvis. 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 Musculoskeletal: No acute or suspicious osseous abnormality. IMPRESSION: 1. Necrotic right upper lobe mass, increased in size from the prior exam with extension into the chest wall musculature on the right with erosions and destructive changes of the T3 and T4 ribs on the right. 2. Small right pleural effusion with atelectasis. 3. Stable spiculated opacities in the left upper lobe with a few surrounding satellite nodules and new pulmonary nodules in the left lower lobe, suspicious for metastasis. 4. No acute process in the abdomen and pelvis. 5. Coronary artery calcifications and aortic atherosclerosis. Electronically Signed   By: Thornell Sartorius M.D.   On: 12/05/2022 20:44   DG Chest 2 View  Result Date: 12/05/2022 CLINICAL DATA:  Lung cancer.  Shortness of breath EXAM: CHEST - 2 VIEW COMPARISON:  X-ray 10/15/2022.  PET-CT scan 11/18/2022. FINDINGS: Large right upper lobe mass is increasing in size compared to the prior x-ray. Please correlate with more recent PET-CT. There is a small right effusion which is increased as well. Left lung is grossly clear without pneumothorax or effusion. No left-sided consolidation. Stable cardiopericardial silhouette without edema. Overlapping cardiac leads. Film is rotated to the right. IMPRESSION: Increasing right-sided lung mass and right-sided pleural effusion. Please correlate with recent PET-CT Electronically Signed   By: Karen Kays M.D.   On: 12/05/2022 15:18        Scheduled Meds:  ALPRAZolam  0.25 mg Oral QHS   heparin  5,000 Units Subcutaneous Q8H   metoprolol succinate  50 mg Oral Daily   mometasone-formoterol  2 puff Inhalation BID   morphine  15 mg Oral Q12H   pantoprazole  40 mg Oral Daily   primidone  50 mg Oral QHS   Continuous Infusions:  magnesium sulfate bolus IVPB       LOS: 0  days    Time spent: 35 minutes    Dorcas CarrowKuber Sharlee Rufino, MD [facility] Pager [phone]

## 2022-12-07 DIAGNOSIS — G893 Neoplasm related pain (acute) (chronic): Secondary | ICD-10-CM | POA: Diagnosis not present

## 2022-12-07 DIAGNOSIS — C3411 Malignant neoplasm of upper lobe, right bronchus or lung: Secondary | ICD-10-CM | POA: Diagnosis not present

## 2022-12-07 DIAGNOSIS — C3491 Malignant neoplasm of unspecified part of right bronchus or lung: Secondary | ICD-10-CM | POA: Diagnosis not present

## 2022-12-07 DIAGNOSIS — Z7189 Other specified counseling: Secondary | ICD-10-CM

## 2022-12-07 DIAGNOSIS — Z515 Encounter for palliative care: Secondary | ICD-10-CM | POA: Diagnosis not present

## 2022-12-07 DIAGNOSIS — Z79899 Other long term (current) drug therapy: Secondary | ICD-10-CM

## 2022-12-07 MED ORDER — NYSTATIN 100000 UNIT/ML MT SUSP
5.0000 mL | Freq: Four times a day (QID) | OROMUCOSAL | Status: DC
  Administered 2022-12-07 – 2022-12-24 (×64): 500000 [IU] via OROMUCOSAL
  Filled 2022-12-07 (×64): qty 5

## 2022-12-07 MED ORDER — LIP MEDEX EX OINT
TOPICAL_OINTMENT | CUTANEOUS | Status: DC | PRN
  Administered 2022-12-07: 75 via TOPICAL
  Filled 2022-12-07: qty 7

## 2022-12-07 NOTE — Evaluation (Signed)
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  Clinical Impression  Pt admitted with above diagnosis.  Pt currently with functional limitations due to the deficits listed below (see PT Problem List). Pt will benefit from acute skilled PT to increase their independence and safety with mobility to allow discharge.  Pt pleasant and ready to ambulate on arrival. Pt reports mostly being at home and furniture walking at baseline but using rollator lately.  Pt reports COPD and lung cancer with right shoulder and flank pain (however tolerable today with premedication).  Pt states she lives with her daughter and her daughter works but arranges her time away from home as needed to navigate pt's appointments.        Recommendations for follow up therapy are one component of a multi-disciplinary discharge planning process, led by the attending physician.  Recommendations may be updated based on patient status, additional functional criteria and insurance authorization.  Follow Up Recommendations       Assistance Recommended at Discharge PRN  Patient can return home with the following  Help with stairs or ramp for entrance;Assistance with cooking/housework;Assist for transportation    Equipment Recommendations None recommended by PT  Recommendations for Other Services       Functional Status Assessment Patient has had a recent decline in their functional status and demonstrates the ability to make significant improvements in function in a reasonable and predictable amount of time.     Precautions / Restrictions Precautions Precautions: Fall Precaution Comments: monitor sats       Mobility  Bed Mobility Overal bed mobility: Needs Assistance Bed Mobility: Supine to Sit     Supine to sit: Supervision, HOB elevated     General bed mobility comments: increased time    Transfers Overall transfer level: Needs assistance Equipment used: Rolling walker (2 wheels) Transfers: Sit to/from Stand Sit to Stand: Min guard           General transfer comment: min/guard for safety, cues for hand placement to self assist    Ambulation/Gait Ambulation/Gait assistance: Min guard Gait Distance (Feet): 40 Feet Assistive device: Rolling walker (2 wheels) Gait Pattern/deviations: Step-through pattern, Decreased stride length, Trunk flexed Gait velocity: decr     General Gait Details: tends to keep RW a little forward however has been using rollator lately, cues for RW positioning and posture as tolerated; SPO2 90% at lowest on room air, distance per pt tolerance  Stairs            Wheelchair Mobility    Modified Rankin (Stroke Patients Only)       Balance Overall balance assessment: Needs assistance         Standing balance support: Bilateral upper extremity supported, Reliant on assistive device for balance Standing balance-Leahy Scale: Poor                               Pertinent Vitals/Pain Pain Assessment Pain Assessment: 0-10 Pain Score: 7  Pain Location: right shoulder and right flank Pain Descriptors / Indicators: Sore, Tender Pain Intervention(s): Repositioned, RN gave pain meds during session    Home Living Family/patient expects to be discharged to:: Private residence Living  Arrangements: Children (daughter) Available Help at Discharge: Family;Available PRN/intermittently Type of Home: Mobile home Home Access: Ramped entrance       Home Layout: One level Home Equipment: Agricultural consultant (2 wheels);Rollator (4 wheels)      Prior Function Prior Level of Function : Independent/Modified Independent              Mobility Comments: typically furniture walks, has been using rollator lately for more stability, no falls       Hand Dominance        Extremity/Trunk Assessment        Lower Extremity Assessment Lower Extremity Assessment: Generalized weakness    Cervical / Trunk Assessment Cervical / Trunk Assessment: Kyphotic  Communication   Communication: HOH  Cognition Arousal/Alertness: Awake/alert Behavior During Therapy: WFL for tasks assessed/performed Overall Cognitive Status: Within Functional Limits for tasks assessed                                          General Comments      Exercises     Assessment/Plan    PT Assessment Patient needs continued PT services  PT Problem List Decreased mobility;Decreased strength;Decreased activity tolerance;Cardiopulmonary status limiting activity;Decreased knowledge of use of DME       PT Treatment Interventions DME instruction;Gait training;Functional mobility training;Therapeutic activities;Patient/family education;Therapeutic exercise    PT Goals (Current goals can be found in the Care Plan section)  Acute Rehab PT Goals PT Goal Formulation: With patient Time For Goal Achievement: 12/21/22 Potential to Achieve Goals: Good    Frequency Min 1X/week     Co-evaluation               AM-PAC PT "6 Clicks" Mobility  Outcome Measure Help needed turning from your back to your side while in a flat bed without using bedrails?: A Little Help needed moving from lying on your back to sitting on the side of a flat bed without using bedrails?: A Little Help needed moving to and from a bed to a chair (including a wheelchair)?: A Little Help needed standing up from a chair using your arms (e.g., wheelchair or bedside chair)?: A Little Help needed to walk in hospital room?: A Little Help needed climbing 3-5 steps with a railing? : A Lot 6 Click Score: 17    End of Session Equipment Utilized During Treatment:  Gait belt Activity Tolerance: Patient tolerated treatment well Patient left: in chair;with call bell/phone within reach;with chair alarm set   PT Visit Diagnosis: Difficulty in walking, not elsewhere classified (R26.2)    Time: 1430-1446 PT Time Calculation (min) (ACUTE ONLY): 16 min   Charges:   PT Evaluation $PT Eval Low Complexity: 1 Low         Kati PT, DPT Physical Therapist Acute Rehabilitation Services Office: [phone]   Janan Halter [place] 12/07/2022, 4:31 PM

## 2022-12-07 NOTE — Progress Notes (Signed)
Daily Progress Note   Patient Name: Courtney Brown       Date: 12/07/2022 DOB: 26-Aug-1940  Age: 82 y.o. MRN#: [id number] Attending Physician: Dorcas Carrow, MD Primary Care Physician: [name], MD Admit Date: 12/05/2022 Length of Stay: 1 day  Reason for Consultation/Follow-up: Pain control  Subjective:   CC: Patient noting certain pain this AM with quick movement but overall improved. Noted slept well last night. Following up regarding pain management.   Subjective:  Reviewed EMR prior to presenting to bedside. In past 24 hours at time of EMR review, patient had received oxycodone 2.5 mg x 2 doses.  Presented to bedside to meet with patient.  Patient laying comfortably in bed.  No family present at bedside.  Patient notes some worsening of acute pain this morning with particular movements though overall her pain has improved.  Patient notes sleeping well last night.  Patient does not have any concerns about current dosing of medication.  Noted we will continue with current opioid management.  All questions answered at that time.  Thanked patient for allowing me to visit with her today.  Review of Systems Pain overall improving Objective:   Vital Signs:  BP (!) 138/41   Pulse 76   Temp 97.6 F (36.4 C) (Oral)   Resp 16   Ht 5' (1.524 m)   Wt 65.7 kg   SpO2 98%   BMI 28.29 kg/m   Physical Exam: General: NAD, alert, laying in bed, hard of hearing, elderly appearing Eyes: No drainage noted HENT: moist mucous membranes Cardiovascular: RRR Respiratory: no increased work of breathing noted, not in respiratory distress Abdomen: not distended Neuro: following commands easily and properly interacting with questions, hard of hearing Psych: appropriately answers all questions  Imaging:  I personally reviewed recent imaging.   Assessment & Plan:   Assessment: Patient 82 year old female with a past medical history of COPD, hypertension, and recently diagnosed stage IV  squamous cell lung cancer with metastatic disease to the bone including ribs who was admitted on 12/05/2022 for management of worsening pain and shortness of breath.  Patient is scheduled to undergo radiation on 5/15.  Patient also receiving management for electrolyte imbalance.  Medicine team consulted to assist with management.   Recommendations/Plan: # Complex medical decision making/goals of care:  -Patient continuing with appropraite medical therapies to address her underlying medical conditions including  cancer directed therapies. Patient already DNR.   -  Code Status: DNR  # Symptom management:    -Pain, in setting of metastatic lung cancer to bones                               -Continue MS Contin 15 mg every 12 hours scheduled                               -Continue oxycodone 2.5 mg every 4 hours as needed.  May need to consider further titration to 5 mg if 2.5 mg dose is not enough.                               -Continue Tylenol 1000 mg scheduled every 8 hours during the day.                               -  Continue lidocaine patch for right-sided ribs.                               -Considered steroids for bone pain though patient notes prednisone made her "crazy" previously.                   -Constipation States issues of constipation at home.  Patient's last bowel movement was yesterday.  Discussed importance of bowel regimen while receiving opioids.                               -Continue MiraLAX 17 g daily                               -Continue Senna 1 tab daily.   # Psycho-social/Spiritual Support:  - Support System: daughters  # Discharge Planning: To Be Determined  -Placed referral for follow up with PMT at [facility] today.   Discussed with: patient, RN  Thank you for allowing the palliative care team to participate in the care Courtney Brown.  Courtney Morin, DO Palliative Care Provider PMT # [phone]  If patient remains symptomatic despite maximum doses, please call  PMT at [phone] between 0700 and 1900. Outside of these hours, please call attending, as PMT does not have night coverage.  *Please note that this is a verbal dictation therefore any spelling or grammatical errors are due to the "[employer]" system interpretation.

## 2022-12-07 NOTE — Progress Notes (Signed)
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  She was recently started on Vicodin and that was not sufficient.  Over the last 3 days she had been without any pain medication having increasing pain and shortness of breath so presented to the ER. Hemodynamically stable in the ER.  CT angio of the chest abdomen pelvis with necrotic right upper lung mass increased in size from recent exam with bony destruction of the third and fourth rib on the right side, multiple pulmonary nodules and also metastatic lesion on the left lung.  Admitted due to unrelenting cancer related pain.   Assessment & Plan:   Stage IV lung cancer, cancer related pain uncontrolled with oral regimen at home:  Currently hemodynamically stable. Started on MS Contin 15 mg twice daily, oxycodone 2.5 mg every 4 hours for breakthrough pain, Tylenol 1 g 3 times daily as a scheduled, lidocaine patches right chest wall. Today her pain is fairly controlled.  She will start mobilizing. Appreciate palliative care involvement. Continue laxative regimen to avoid constipation. Start mobilizing, if pain is adequately controlled on current regimen will discharge her home with oncology follow-up after Port-A-Cath tomorrow.  Stage IV lung cancer: Followed by cancer center in [place].  Is scheduled for Port-A-Cath on 5/13.  Scheduled to have radiation to the right lung 5/15.  She may continue outpatient follow-up once pain control achieved. Since she is in the hospital, she will receive Port-A-Cath tomorrow.  Hypomagnesemia: Replaced.  Leukocytosis: Will monitor.  There is no evidence of bacterial infection.  Suspected pneumonia: There is no evidence of  pneumonia on CT scan.  No clinical evidence of bacterial infection.  Discontinue antibiotics.  Chronic medical issues including COPD, stable Hypertension, stable on Toprol.    DVT prophylaxis: heparin injection 5,000 Units Start: 12/06/22 0600 SCDs Start: 12/05/22 2355   Code Status: DNR, documented in the chart Family Communication: Daughter on the phone Disposition Plan: Status is: Inpatient Remains inpatient appropriate because: Significant cancer related pain     Consultants:  Palliative care  Procedures:  None  Antimicrobials:  Vancomycin and Zosyn, discontinued.   Subjective:  Patient seen and examined.  She tells me her pain is much better than yesterday.  She only used 2 additional doses of oxycodone 2.5 mg since last 12 hours.  Not sure how she will do on walking.  Objective: Vitals:   12/06/22 2031 12/07/22 0522 12/07/22 0834 12/07/22 1043  BP: (!) 133/46   (!) 138/41  Pulse: 75   76  Resp: 16     Temp: 97.6 F (36.4 C)     TempSrc: Oral     SpO2: 98%  98%   Weight:  65.7 kg    Height:        Intake/Output Summary (Last 24 hours) at 12/07/2022 1104 Last data filed at 12/06/2022 1700 Gross per 24 hour  Intake 120 ml  Output --  Net 120 ml   Filed Weights   12/05/22 2356 12/06/22 0106 12/07/22 0522  Weight: 65.6 kg 64.9 kg 65.7 kg    Examination:  General exam: Appears calm and comfortable  Respiratory system: Mostly clear.  No added sounds.  On 1 L of oxygen. Cardiovascular system: S1 & S2 heard,  RRR. No pedal edema. Gastrointestinal system: Soft.  Nontender.  Bowel sound present. Central nervous system: Alert and oriented. No focal neurological deficits. Extremities: Symmetric 5 x 5 power. Skin: No rashes, lesions or ulcers Psychiatry: Judgement and insight appear normal. Mood & affect appropriate.     Data Reviewed: I have personally reviewed following labs and imaging studies  CBC: Recent Labs  Lab 12/05/22 1439 12/06/22 0622   WBC 20.3* 17.5*  NEUTROABS 17.1* 13.6*  HGB 9.3* 9.4*  HCT 31.2* 32.5*  MCV 82.1 84.0  PLT 738* 609*    Basic Metabolic Panel: Recent Labs  Lab 12/05/22 1439 12/06/22 0622  NA 138 139  K 3.4* 3.6  CL 101 103  CO2 31 29  GLUCOSE 125* 103*  BUN 10 9  CREATININE 0.62 0.59  CALCIUM 10.4* 9.9  MG  --  1.6*    GFR: Estimated Creatinine Clearance: 46.7 mL/min (by C-G formula based on SCr of 0.59 mg/dL). Liver Function Tests: Recent Labs  Lab 12/05/22 1439 12/06/22 0622  AST 18 22  ALT 10 10  ALKPHOS 48 45  BILITOT 0.6 0.4  PROT 5.7* 5.4*  ALBUMIN 1.8* 1.8*    Recent Labs  Lab 12/05/22 1439  LIPASE 22    No results for input(s): "AMMONIA" in the last 168 hours. Coagulation Profile: No results for input(s): "INR", "PROTIME" in the last 168 hours. Cardiac Enzymes: No results for input(s): "CKTOTAL", "CKMB", "CKMBINDEX", "TROPONINI" in the last 168 hours. BNP (last 3 results) No results for input(s): "PROBNP" in the last 8760 hours. HbA1C: No results for input(s): "HGBA1C" in the last 72 hours. CBG: No results for input(s): "GLUCAP" in the last 168 hours. Lipid Profile: No results for input(s): "CHOL", "HDL", "LDLCALC", "TRIG", "CHOLHDL", "LDLDIRECT" in the last 72 hours. Thyroid Function Tests: No results for input(s): "TSH", "T4TOTAL", "FREET4", "T3FREE", "THYROIDAB" in the last 72 hours. Anemia Panel: No results for input(s): "VITAMINB12", "FOLATE", "FERRITIN", "TIBC", "IRON", "RETICCTPCT" in the last 72 hours. Sepsis Labs: Recent Labs  Lab 12/06/22 0622  PROCALCITON <0.10     Recent Results (from the past 240 hour(s))  Urine Culture     Status: Abnormal   Collection Time: 12/05/22  2:39 PM   Specimen: Urine, Clean Catch  Result Value Ref Range Status   Specimen Description   Final    URINE, CLEAN CATCH Performed at [facility], [address], [place], [place] [address]    Special Requests   Final    NONE Performed at  [facility], [address], [place], [place] [address]    Culture MULTIPLE SPECIES PRESENT, SUGGEST RECOLLECTION (A)  Final   Report Status 12/06/2022 FINAL  Final         Radiology Studies: CT CHEST ABDOMEN PELVIS W CONTRAST  Result Date: 12/05/2022 CLINICAL DATA:  Sepsis, history of pulmonary malignancy. Shortness of breath, nausea and vomiting. EXAM: CT CHEST, ABDOMEN, AND PELVIS WITH CONTRAST TECHNIQUE: Multidetector CT imaging of the chest, abdomen and pelvis was performed following the standard protocol during bolus administration of intravenous contrast. RADIATION DOSE REDUCTION: This exam was performed according to the departmental dose-optimization program which includes automated exposure control, adjustment of the mA and/or kV according to patient size and/or use of iterative reconstruction technique. CONTRAST:  OMNIPAQUE IOHEXOL 300 MG/ML  SOLN COMPARISON:  02/13/2021, 11/18/2022, 10/23/2022. FINDINGS: CT CHEST FINDINGS Cardiovascular: Heart is enlarged and there is a trace pericardial effusion. Few scattered coronary artery calcifications are noted. There is atherosclerotic  calcification of the aorta without evidence of aneurysm. The pulmonary trunk is normal in caliber. Mediastinum/Nodes: Enlarged lymph nodes are present in the mediastinum measuring up to 1 cm in the right paratracheal space there suspected enlarged lymph nodes at the right hilum measuring up to 1.5 cm. No left hilar or axillary lymphadenopathy. The thyroid gland, trachea, and esophagus are within normal limits. Lungs/Pleura: Paraseptal and centrilobular emphysematous changes are present in the lungs. There is a small loculated pleural effusion on the right. There is redemonstration of a right upper lobe mass with central necrosis and extension of tumor into the chest wall musculature and soft tissues on the right. Atelectasis is present at the right lung base. Spiculated opacities are noted in  the left upper lobe with a few satellite nodules and not significantly changed from the prior exam. Few pulmonary nodules are noted in the left lower lobe, the largest measuring 5 mm, axial image 93, new from the previous exam. Musculoskeletal: There are erosions and bony destruction involving the T3 and T4 ribs on the right. No acute fracture is seen. CT ABDOMEN PELVIS FINDINGS Hepatobiliary: No focal liver abnormality is seen. Status post cholecystectomy. No biliary dilatation. Pancreas: Unremarkable. No pancreatic ductal dilatation or surrounding inflammatory changes. Spleen: Normal in size without focal abnormality. 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 The left adrenal gland is within normal limits. The kidneys enhance symmetrically with multifocal renal cortical thinning or scarring. Stable cysts and hypodensities are present within the kidneys bilaterally. No renal calculus or hydronephrosis. The bladder is unremarkable. Stomach/Bowel: Stomach is within normal limits. Appendix appears normal. No evidence of bowel wall thickening, distention, or inflammatory changes. No free air or pneumatosis. A few scattered diverticula are present along the colon without evidence of diverticulitis. Vascular/Lymphatic: Aortic atherosclerosis. No enlarged abdominal or pelvic lymph nodes. Reproductive: Status post hysterectomy. No adnexal masses. Other: A trace amount of free fluid is noted in the pelvis. 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 Musculoskeletal: No acute or suspicious osseous abnormality. IMPRESSION: 1. Necrotic right upper lobe mass, increased in size from the prior exam with extension into the chest wall musculature on the right with erosions and destructive changes of the T3 and T4 ribs on the right. 2. Small right pleural  effusion with atelectasis. 3. Stable spiculated opacities in the left upper lobe with a few surrounding satellite nodules and new pulmonary nodules in the left lower lobe, suspicious for metastasis. 4. No acute process in the abdomen and pelvis. 5. Coronary artery calcifications and aortic atherosclerosis. Electronically Signed   By: Thornell Sartorius M.D.   On: 12/05/2022 20:44   DG Chest 2 View  Result Date: 12/05/2022 CLINICAL DATA:  Lung cancer.  Shortness of breath EXAM: CHEST - 2 VIEW COMPARISON:  X-ray 10/15/2022.  PET-CT scan 11/18/2022. FINDINGS: Large right upper lobe mass is increasing in size compared to the prior x-ray. Please correlate with more recent PET-CT. There is a small right effusion which is increased as well. Left lung is grossly clear without pneumothorax or effusion. No left-sided consolidation. Stable cardiopericardial silhouette without edema. Overlapping cardiac leads. Film is rotated to the right. IMPRESSION: Increasing right-sided lung mass and right-sided pleural effusion. Please correlate with recent PET-CT Electronically Signed   By: Karen Kays M.D.   On: 12/05/2022 15:18        Scheduled  Meds:  acetaminophen  1,000 mg Oral Q8H   ALPRAZolam  0.25 mg Oral QHS   heparin  5,000 Units Subcutaneous Q8H   lidocaine  1 patch Transdermal Q24H   metoprolol succinate  50 mg Oral Daily   mometasone-formoterol  2 puff Inhalation BID   morphine  15 mg Oral Q12H   pantoprazole  40 mg Oral Daily   polyethylene glycol  17 g Oral Daily   primidone  50 mg Oral QHS   senna  1 tablet Oral Daily   Continuous Infusions:     LOS: 1 day    Time spent: 35 minutes    Dorcas Carrow, MD [facility] Pager [phone]

## 2022-12-08 ENCOUNTER — Other Ambulatory Visit: Payer: Self-pay

## 2022-12-08 ENCOUNTER — Ambulatory Visit: Admission: RE | Admit: 2022-12-08 | Payer: 59 | Source: Home / Self Care | Admitting: General Surgery

## 2022-12-08 ENCOUNTER — Telehealth: Payer: Self-pay | Admitting: Nurse Practitioner

## 2022-12-08 ENCOUNTER — Inpatient Hospital Stay (HOSPITAL_COMMUNITY): Payer: 59

## 2022-12-08 ENCOUNTER — Encounter (HOSPITAL_COMMUNITY)
Admission: EM | Disposition: A | Discharge status: Skilled Nursing Facility | Payer: Self-pay | Source: Home / Self Care | Attending: Internal Medicine

## 2022-12-08 ENCOUNTER — Inpatient Hospital Stay (HOSPITAL_COMMUNITY): Payer: 59 | Admitting: Physician Assistant

## 2022-12-08 ENCOUNTER — Encounter (HOSPITAL_COMMUNITY): Payer: Self-pay | Admitting: Internal Medicine

## 2022-12-08 DIAGNOSIS — C3490 Malignant neoplasm of unspecified part of unspecified bronchus or lung: Secondary | ICD-10-CM

## 2022-12-08 DIAGNOSIS — I10 Essential (primary) hypertension: Secondary | ICD-10-CM

## 2022-12-08 DIAGNOSIS — C3411 Malignant neoplasm of upper lobe, right bronchus or lung: Secondary | ICD-10-CM | POA: Diagnosis not present

## 2022-12-08 DIAGNOSIS — I2510 Atherosclerotic heart disease of native coronary artery without angina pectoris: Secondary | ICD-10-CM | POA: Diagnosis not present

## 2022-12-08 DIAGNOSIS — Z87891 Personal history of nicotine dependence: Secondary | ICD-10-CM

## 2022-12-08 DIAGNOSIS — Z515 Encounter for palliative care: Secondary | ICD-10-CM | POA: Diagnosis not present

## 2022-12-08 DIAGNOSIS — C3491 Malignant neoplasm of unspecified part of right bronchus or lung: Secondary | ICD-10-CM | POA: Diagnosis not present

## 2022-12-08 DIAGNOSIS — G893 Neoplasm related pain (acute) (chronic): Secondary | ICD-10-CM | POA: Diagnosis not present

## 2022-12-08 DIAGNOSIS — I252 Old myocardial infarction: Secondary | ICD-10-CM | POA: Diagnosis not present

## 2022-12-08 DIAGNOSIS — J449 Chronic obstructive pulmonary disease, unspecified: Secondary | ICD-10-CM

## 2022-12-08 HISTORY — DX: Anemia, unspecified: D64.9

## 2022-12-08 HISTORY — PX: PORTACATH PLACEMENT: SHX2246

## 2022-12-08 LAB — LIPASE, BLOOD: Lipase: 162 U/L — ABNORMAL HIGH (ref 11–51)

## 2022-12-08 LAB — BASIC METABOLIC PANEL
Anion gap: 7 (ref 5–15)
BUN: 12 mg/dL (ref 8–23)
CO2: 28 mmol/L (ref 22–32)
Calcium: 9.5 mg/dL (ref 8.9–10.3)
Chloride: 103 mmol/L (ref 98–111)
Creatinine, Ser: 0.54 mg/dL (ref 0.44–1.00)
GFR, Estimated: >60 mL/min (ref >60)
Glucose, Bld: 127 mg/dL — ABNORMAL HIGH (ref 70–99)
Potassium: 3.6 mmol/L (ref 3.5–5.1)
Sodium: 138 mmol/L (ref 135–145)

## 2022-12-08 LAB — CBC WITH DIFFERENTIAL/PLATELET
Abs Immature Granulocytes: 0.29 10*3/uL — ABNORMAL HIGH (ref 0.00–0.07)
Basophils Absolute: 0.1 10*3/uL (ref 0.0–0.1)
Basophils Relative: 0 %
Eosinophils Absolute: 0.1 10*3/uL (ref 0.0–0.5)
Eosinophils Relative: 1 %
HCT: 33.2 % — ABNORMAL LOW (ref 36.0–46.0)
Hemoglobin: 10 g/dL — ABNORMAL LOW (ref 12.0–15.0)
Immature Granulocytes: 1 %
Lymphocytes Relative: 5 %
Lymphs Abs: 1.3 10*3/uL (ref 0.7–4.0)
MCH: 24.3 pg — ABNORMAL LOW (ref 26.0–34.0)
MCHC: 30.1 g/dL (ref 30.0–36.0)
MCV: 80.8 fL (ref 80.0–100.0)
Monocytes Absolute: 1.6 10*3/uL — ABNORMAL HIGH (ref 0.1–1.0)
Monocytes Relative: 6 %
Neutro Abs: 22.1 10*3/uL — ABNORMAL HIGH (ref 1.7–7.7)
Neutrophils Relative %: 87 %
Platelets: 656 10*3/uL — ABNORMAL HIGH (ref 150–400)
RBC: 4.11 MIL/uL (ref 3.87–5.11)
RDW: 17.2 % — ABNORMAL HIGH (ref 11.5–15.5)
WBC: 25.4 10*3/uL — ABNORMAL HIGH (ref 4.0–10.5)
nRBC: 0 % (ref 0.0–0.2)

## 2022-12-08 LAB — LACTIC ACID, PLASMA: Lactic Acid, Venous: 1.5 mmol/L (ref 0.5–1.9)

## 2022-12-08 LAB — MAGNESIUM: Magnesium: 1.5 mg/dL — ABNORMAL LOW (ref 1.7–2.4)

## 2022-12-08 LAB — PHOSPHORUS: Phosphorus: 3.6 mg/dL (ref 2.5–4.6)

## 2022-12-08 SURGERY — INSERTION, TUNNELED CENTRAL VENOUS DEVICE, WITH PORT
Anesthesia: General

## 2022-12-08 MED ORDER — LIDOCAINE 2% (20 MG/ML) 5 ML SYRINGE
INTRAMUSCULAR | Status: DC | PRN
  Administered 2022-12-08: 40 mg via INTRAVENOUS

## 2022-12-08 MED ORDER — FENTANYL CITRATE (PF) 250 MCG/5ML IJ SOLN
INTRAMUSCULAR | Status: AC
  Filled 2022-12-08: qty 5

## 2022-12-08 MED ORDER — MAGNESIUM SULFATE 2 GM/50ML IV SOLN
2.0000 g | Freq: Once | INTRAVENOUS | Status: AC
  Administered 2022-12-08: 2 g via INTRAVENOUS
  Filled 2022-12-08: qty 50

## 2022-12-08 MED ORDER — SODIUM CHLORIDE 0.9 % IV BOLUS
2000.0000 mL | Freq: Once | INTRAVENOUS | Status: AC
  Administered 2022-12-08: 2000 mL via INTRAVENOUS

## 2022-12-08 MED ORDER — FENTANYL CITRATE (PF) 250 MCG/5ML IJ SOLN
INTRAMUSCULAR | Status: DC | PRN
  Administered 2022-12-08: 25 ug via INTRAVENOUS

## 2022-12-08 MED ORDER — SUCCINYLCHOLINE CHLORIDE 200 MG/10ML IV SOSY
PREFILLED_SYRINGE | INTRAVENOUS | Status: AC
  Filled 2022-12-08: qty 10

## 2022-12-08 MED ORDER — IOHEXOL 9 MG/ML PO SOLN
500.0000 mL | ORAL | Status: AC
  Administered 2022-12-08: 500 mL via ORAL

## 2022-12-08 MED ORDER — HEPARIN SOD (PORK) LOCK FLUSH 100 UNIT/ML IV SOLN
INTRAVENOUS | Status: AC
  Filled 2022-12-08: qty 5

## 2022-12-08 MED ORDER — IOHEXOL 300 MG/ML  SOLN
100.0000 mL | Freq: Once | INTRAMUSCULAR | Status: AC | PRN
  Administered 2022-12-08: 100 mL via INTRAVENOUS

## 2022-12-08 MED ORDER — IOHEXOL 9 MG/ML PO SOLN
ORAL | Status: AC
  Filled 2022-12-08: qty 1000

## 2022-12-08 MED ORDER — HYDROMORPHONE HCL 1 MG/ML IJ SOLN
0.5000 mg | Freq: Once | INTRAMUSCULAR | Status: AC
  Administered 2022-12-08: 0.5 mg via INTRAVENOUS
  Filled 2022-12-08: qty 0.5

## 2022-12-08 MED ORDER — FAMOTIDINE IN NACL 20-0.9 MG/50ML-% IV SOLN
20.0000 mg | Freq: Two times a day (BID) | INTRAVENOUS | Status: DC
  Administered 2022-12-08 – 2022-12-09 (×3): 20 mg via INTRAVENOUS
  Filled 2022-12-08 (×3): qty 50

## 2022-12-08 MED ORDER — PROPOFOL 10 MG/ML IV BOLUS
INTRAVENOUS | Status: AC
  Filled 2022-12-08: qty 20

## 2022-12-08 MED ORDER — ONDANSETRON HCL 4 MG/2ML IJ SOLN
INTRAMUSCULAR | Status: DC | PRN
  Administered 2022-12-08: 4 mg via INTRAVENOUS

## 2022-12-08 MED ORDER — DEXAMETHASONE SODIUM PHOSPHATE 10 MG/ML IJ SOLN
INTRAMUSCULAR | Status: AC
  Filled 2022-12-08: qty 1

## 2022-12-08 MED ORDER — LACTATED RINGERS IV SOLN: INTRAVENOUS | Status: DC

## 2022-12-08 MED ORDER — CHLORHEXIDINE GLUCONATE 0.12 % MT SOLN
15.0000 mL | Freq: Once | OROMUCOSAL | Status: AC
  Administered 2022-12-08: 15 mL via OROMUCOSAL

## 2022-12-08 MED ORDER — BUPIVACAINE-EPINEPHRINE (PF) 0.25% -1:200000 IJ SOLN
INTRAMUSCULAR | Status: AC
  Filled 2022-12-08: qty 30

## 2022-12-08 MED ORDER — ONDANSETRON HCL 4 MG/2ML IJ SOLN
INTRAMUSCULAR | Status: AC
  Filled 2022-12-08: qty 2

## 2022-12-08 MED ORDER — HEPARIN 6000 UNIT IRRIGATION SOLUTION
Freq: Once | Status: AC
  Administered 2022-12-08: 1
  Filled 2022-12-08: qty 6000

## 2022-12-08 MED ORDER — ROCURONIUM BROMIDE 10 MG/ML (PF) SYRINGE
PREFILLED_SYRINGE | INTRAVENOUS | Status: AC
  Filled 2022-12-08: qty 10

## 2022-12-08 MED ORDER — MIDAZOLAM HCL 2 MG/2ML IJ SOLN
INTRAMUSCULAR | Status: AC
  Filled 2022-12-08: qty 2

## 2022-12-08 MED ORDER — CEFAZOLIN SODIUM-DEXTROSE 2-4 GM/100ML-% IV SOLN
2.0000 g | INTRAVENOUS | Status: AC
  Administered 2022-12-08: 2 g via INTRAVENOUS
  Filled 2022-12-08: qty 100

## 2022-12-08 MED ORDER — HEPARIN SOD (PORK) LOCK FLUSH 100 UNIT/ML IV SOLN
INTRAVENOUS | Status: DC | PRN
  Administered 2022-12-08: 500 [IU] via INTRAVENOUS

## 2022-12-08 MED ORDER — ORAL CARE MOUTH RINSE: 15.0000 mL | Freq: Once | OROMUCOSAL | Status: AC

## 2022-12-08 MED ORDER — LIDOCAINE HCL (PF) 1 % IJ SOLN
INTRAMUSCULAR | Status: DC | PRN
  Administered 2022-12-08: 10 mL

## 2022-12-08 MED ORDER — LIDOCAINE HCL 1 % IJ SOLN
INTRAMUSCULAR | Status: AC
  Filled 2022-12-08: qty 20

## 2022-12-08 MED ORDER — PROPOFOL 10 MG/ML IV BOLUS
INTRAVENOUS | Status: DC | PRN
  Administered 2022-12-08: 80 mg via INTRAVENOUS

## 2022-12-08 MED ORDER — 0.9 % SODIUM CHLORIDE (POUR BTL) OPTIME
TOPICAL | Status: DC | PRN
  Administered 2022-12-08: 1000 mL

## 2022-12-08 MED ORDER — KETAMINE HCL 50 MG/5ML IJ SOSY
PREFILLED_SYRINGE | INTRAMUSCULAR | Status: AC
  Filled 2022-12-08: qty 5

## 2022-12-08 MED ORDER — HYDROMORPHONE HCL 1 MG/ML IJ SOLN
1.0000 mg | INTRAMUSCULAR | Status: DC | PRN
  Administered 2022-12-08 – 2022-12-19 (×3): 1 mg via INTRAVENOUS
  Filled 2022-12-08 (×3): qty 1

## 2022-12-08 MED ORDER — FENTANYL CITRATE (PF) 100 MCG/2ML IJ SOLN
INTRAMUSCULAR | Status: AC
  Filled 2022-12-08: qty 2

## 2022-12-08 SURGICAL SUPPLY — 43 items
ADH SKN CLS APL DERMABOND .7 (GAUZE/BANDAGES/DRESSINGS) ×1
APL PRP STRL LF DISP 70% ISPRP (MISCELLANEOUS) ×1
APL SKNCLS STERI-STRIP NONHPOA (GAUZE/BANDAGES/DRESSINGS) ×1
BAG COUNTER SPONGE SURGICOUNT (BAG) IMPLANT
BAG DECANTER FOR FLEXI CONT (MISCELLANEOUS) ×1 IMPLANT
BAG SPNG CNTER NS LX DISP (BAG)
BENZOIN TINCTURE PRP APPL 2/3 (GAUZE/BANDAGES/DRESSINGS) ×1 IMPLANT
BLADE SURG 15 STRL LF DISP TIS (BLADE) ×1 IMPLANT
BLADE SURG 15 STRL SS (BLADE) ×1
BLADE SURG SZ11 CARB STEEL (BLADE) ×1 IMPLANT
CHLORAPREP W/TINT 26 (MISCELLANEOUS) ×1 IMPLANT
COVER SURGICAL LIGHT HANDLE (MISCELLANEOUS) ×1 IMPLANT
DERMABOND ADVANCED .7 DNX12 (GAUZE/BANDAGES/DRESSINGS) ×1 IMPLANT
DRAPE C-ARM 42X120 X-RAY (DRAPES) ×1 IMPLANT
DRAPE LAPAROSCOPIC ABDOMINAL (DRAPES) ×1 IMPLANT
ELECT REM PT RETURN 15FT ADLT (MISCELLANEOUS) ×1 IMPLANT
GAUZE 4X4 16PLY ~~LOC~~+RFID DBL (SPONGE) ×1 IMPLANT
GAUZE SPONGE 4X4 12PLY STRL (GAUZE/BANDAGES/DRESSINGS) ×1 IMPLANT
GLOVE BIOGEL PI IND STRL 7.0 (GLOVE) ×1 IMPLANT
GLOVE SURG SS PI 7.0 STRL IVOR (GLOVE) ×1 IMPLANT
GOWN STRL REUS W/ TWL LRG LVL3 (GOWN DISPOSABLE) ×1 IMPLANT
GOWN STRL REUS W/ TWL XL LVL3 (GOWN DISPOSABLE) IMPLANT
GOWN STRL REUS W/TWL LRG LVL3 (GOWN DISPOSABLE) ×1
GOWN STRL REUS W/TWL XL LVL3 (GOWN DISPOSABLE)
KIT BASIN OR (CUSTOM PROCEDURE TRAY) ×1 IMPLANT
KIT PORT POWER 8FR ISP CVUE (Port) ×1 IMPLANT
KIT TURNOVER KIT A (KITS) IMPLANT
NDL HYPO 22X1.5 SAFETY MO (MISCELLANEOUS) ×1 IMPLANT
NEEDLE HYPO 22X1.5 SAFETY MO (MISCELLANEOUS) ×1 IMPLANT
PACK BASIC VI WITH GOWN DISP (CUSTOM PROCEDURE TRAY) ×1 IMPLANT
PENCIL SMOKE EVACUATOR (MISCELLANEOUS) IMPLANT
SPIKE FLUID TRANSFER (MISCELLANEOUS) ×1 IMPLANT
SUT MNCRL AB 4-0 PS2 18 (SUTURE) ×1 IMPLANT
SUT PROLENE 2 0 SH DA (SUTURE) ×1 IMPLANT
SUT VIC AB 2-0 SH 18 (SUTURE) ×1 IMPLANT
SUT VIC AB 2-0 SH 27 (SUTURE)
SUT VIC AB 2-0 SH 27X BRD (SUTURE) IMPLANT
SUT VIC AB 3-0 SH 27 (SUTURE) ×1
SUT VIC AB 3-0 SH 27XBRD (SUTURE) ×1 IMPLANT
SYR 10ML LL (SYRINGE) ×1 IMPLANT
SYR 20ML LL LF (SYRINGE) ×1 IMPLANT
TOWEL OR 17X26 10 PK STRL BLUE (TOWEL DISPOSABLE) ×1 IMPLANT
TOWEL OR NON WOVEN STRL DISP B (DISPOSABLE) ×1 IMPLANT

## 2022-12-08 NOTE — Progress Notes (Signed)
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  She was recently started on Vicodin and that was not sufficient.  Over the last 3 days she had been without any pain medication having increasing pain and shortness of breath so presented to the ER. Hemodynamically stable in the ER.  CT angio of the chest abdomen pelvis with necrotic right upper lung mass increased in size from recent exam with bony destruction of the third and fourth rib on the right side, multiple pulmonary nodules and also metastatic lesion on the left lung.  Admitted due to unrelenting cancer related pain.   Assessment & Plan:   Stage IV lung cancer, cancer related pain uncontrolled with oral regimen at home:  Currently hemodynamically stable. Started on MS Contin 15 mg twice daily, oxycodone 2.5 mg every 4 hours for breakthrough pain, Tylenol 1 g 3 times daily as a scheduled, lidocaine patches right chest wall. Patient has new onset epigastric pain today, will provide additional Dilaudid injectable and started on Pepcid.  Recent CT scan was without any intra-abdominal findings. Appreciate palliative care involvement. Continue laxative regimen to avoid constipation. Start mobilizing, if pain is adequately controlled on current regimen will discharge her home with oncology follow-up after Port-A-Cath placement and pain control today.  Stage IV lung cancer: Followed by cancer center in [place].  Is scheduled for Port-A-Cath on 5/13.  Scheduled to have radiation to the right lung 5/15.  She may continue outpatient follow-up once pain control achieved. Since she is in the hospital, she will receive Port-A-Cath today.  Hypomagnesemia: Replaced.   Resolved.  Leukocytosis: Will monitor.  There is no evidence of bacterial infection.  Suspected pneumonia: There is no evidence of pneumonia on CT scan.  No clinical evidence of bacterial infection.  Discontinue antibiotics.  Chronic medical issues including COPD, stable Hypertension, stable on Toprol.    DVT prophylaxis: heparin injection 5,000 Units Start: 12/06/22 0600 SCDs Start: 12/05/22 2355   Code Status: DNR, documented in the chart Family Communication: Daughter on the phone 5/12 Disposition Plan: Status is: Inpatient Remains inpatient appropriate because: Significant cancer related pain, inpatient procedures.     Consultants:  Palliative care  Procedures:  None  Antimicrobials:  Vancomycin and Zosyn, discontinued.   Subjective:  Patient seen and examined.  She is in moderate distress today because of epigastric pain, she feels like throwing.  Denies any chest pain or rib pains but today she is mostly worried due to her having epigastric pain and nausea.  Objective: Vitals:   12/07/22 2042 12/08/22 0512 12/08/22 0512 12/08/22 0848  BP: (!) 128/44  (!) 146/53   Pulse: 89  86   Resp: 16  18   Temp: 97.9 F (36.6 C)  98.2 F (36.8 C)   TempSrc: Oral  Oral   SpO2: 99%  96% 96%  Weight:  67.4 kg    Height:        Intake/Output Summary (Last 24 hours) at 12/08/2022 1045 Last data filed at 12/07/2022 1100 Gross per 24 hour  Intake 180 ml  Output --  Net 180 ml    Filed Weights   12/06/22 0106 12/07/22 0522 12/08/22 0512  Weight: 64.9 kg 65.7 kg 67.4 kg  Examination:  General exam: Appears uncomfortable today.  Anxious. Respiratory system: Mostly clear.  No added sounds.  On room air. Cardiovascular system: S1 & S2 heard, RRR. No pedal edema. Gastrointestinal system: Soft.  Mild diffuse tenderness along the epigastrium.  No rigidity or guarding.  Bowel sound present. Central nervous system: Alert and oriented. No focal neurological  deficits. Extremities: Symmetric 5 x 5 power. Skin: No rashes, lesions or ulcers   Data Reviewed: I have personally reviewed following labs and imaging studies  CBC: Recent Labs  Lab 12/05/22 1439 12/06/22 0622  WBC 20.3* 17.5*  NEUTROABS 17.1* 13.6*  HGB 9.3* 9.4*  HCT 31.2* 32.5*  MCV 82.1 84.0  PLT 738* 609*    Basic Metabolic Panel: Recent Labs  Lab 12/05/22 1439 12/06/22 0622  NA 138 139  K 3.4* 3.6  CL 101 103  CO2 31 29  GLUCOSE 125* 103*  BUN 10 9  CREATININE 0.62 0.59  CALCIUM 10.4* 9.9  MG  --  1.6*    GFR: Estimated Creatinine Clearance: 47.3 mL/min (by C-G formula based on SCr of 0.59 mg/dL). Liver Function Tests: Recent Labs  Lab 12/05/22 1439 12/06/22 0622  AST 18 22  ALT 10 10  ALKPHOS 48 45  BILITOT 0.6 0.4  PROT 5.7* 5.4*  ALBUMIN 1.8* 1.8*    Recent Labs  Lab 12/05/22 1439  LIPASE 22    No results for input(s): "AMMONIA" in the last 168 hours. Coagulation Profile: No results for input(s): "INR", "PROTIME" in the last 168 hours. Cardiac Enzymes: No results for input(s): "CKTOTAL", "CKMB", "CKMBINDEX", "TROPONINI" in the last 168 hours. BNP (last 3 results) No results for input(s): "PROBNP" in the last 8760 hours. HbA1C: No results for input(s): "HGBA1C" in the last 72 hours. CBG: No results for input(s): "GLUCAP" in the last 168 hours. Lipid Profile: No results for input(s): "CHOL", "HDL", "LDLCALC", "TRIG", "CHOLHDL", "LDLDIRECT" in the last 72 hours. Thyroid Function Tests: No results for input(s): "TSH", "T4TOTAL", "FREET4", "T3FREE", "THYROIDAB" in the last 72 hours. Anemia Panel: No results for input(s): "VITAMINB12", "FOLATE", "FERRITIN", "TIBC", "IRON", "RETICCTPCT" in the last 72 hours. Sepsis Labs: Recent Labs  Lab 12/06/22 0622  PROCALCITON <0.10     Recent Results (from the past 240 hour(s))  Urine Culture     Status: Abnormal   Collection Time: 12/05/22  2:39 PM   Specimen: Urine, Clean Catch  Result  Value Ref Range Status   Specimen Description   Final    URINE, CLEAN CATCH Performed at [facility], [address] [place], [place] [address]    Special Requests   Final    NONE Performed at [facility], [address], [place], [place] [address]    Culture MULTIPLE SPECIES PRESENT, SUGGEST RECOLLECTION (A)  Final   Report Status 12/06/2022 FINAL  Final         Radiology Studies: No results found.      Scheduled Meds:  acetaminophen  1,000 mg Oral Q8H   ALPRAZolam  0.25 mg Oral QHS   chlorhexidine  15 mL Mouth/Throat Once   Or   mouth rinse  15 mL Mouth Rinse Once   heparin  5,000 Units Subcutaneous Q8H   lidocaine  1 patch Transdermal Q24H   metoprolol succinate  50 mg Oral Daily   mometasone-formoterol  2 puff Inhalation BID   morphine  15 mg Oral Q12H   nystatin  5 mL Mouth/Throat QID   pantoprazole  40 mg Oral  Daily   polyethylene glycol  17 g Oral Daily   primidone  50 mg Oral QHS   senna  1 tablet Oral Daily   Continuous Infusions:  famotidine (PEPCID) IV 20 mg (12/08/22 1000)   lactated ringers        LOS: 2 days    Time spent: 35 minutes    Dorcas Carrow, MD [facility] Pager [phone]

## 2022-12-08 NOTE — Anesthesia Procedure Notes (Signed)
Procedure Name: LMA Insertion Date/Time: 12/08/2022 12:21 PM  Performed by: Sindy Guadeloupe, CRNAPre-anesthesia Checklist: Patient identified, Emergency Drugs available, Suction available, Patient being monitored and Timeout performed Patient Re-evaluated:Patient Re-evaluated prior to induction Oxygen Delivery Method: Circle system utilized Preoxygenation: Pre-oxygenation with 100% oxygen Induction Type: IV induction Ventilation: Mask ventilation without difficulty LMA: LMA inserted and LMA with gastric port inserted LMA Size: 4.0 Number of attempts: 1 Tube secured with: Tape Dental Injury: Teeth and Oropharynx as per pre-operative assessment

## 2022-12-08 NOTE — Progress Notes (Signed)
Daily Progress Note   Patient Name: Courtney Brown       Date: 12/08/2022 DOB: [birth year]  Age: 82 y.o. MRN#: [id number] Attending Physician: Dorcas Carrow, MD Primary Care Physician: [name], MD Admit Date: 12/05/2022 Length of Stay: 2 days  Reason for Consultation/Follow-up: Pain control  Subjective:   CC: Patient seen this afternoon after port placement.  Following up regarding pain management.   Subjective:  Reviewed EMR prior to presenting to bedside. Patient received oxycodone 2.5 mg x 5 doses in past 24 hours.  Patient having Port-A-Cath placed this morning.   Discussed care with hospitalist prior to seeing patient.   Presented to bedside to see patient later in the afternoon after procedure.  Patient's daughter was present at bedside.  Patient sleepy though easily able to awaken.  Patient notes the pain associated with her bone pain has improved on opioid regimen.  Patient now having epigastric/abdominal pain that started yesterday evening that she has not had before.  Patient noted last bowel movement yesterday.  Patient having difficulties describing pain besides that it hurts.  Daughter noted that hospitalist had already called to discuss with her possible further workup of abdominal pain.  Acknowledged this and deferred to hospitalist regarding workup.  Noted would not increase current pain medications aimed at metastatic disease for abdominal pain without knowing source of abdominal pain.  Daughter agreed with this and continuing current regimen.  Informed daughter that this provider would be off tomorrow though my partner Dr. Linna Darner would be able to check in if needed.  Daughter also voiced that hospitalist will let her know if patient is supposed to get radiation simulation tomorrow versus having that postponed due to her remaining in the hospital.  All questions answered at that time.  Thanked patient and daughter for allowing me to visit with them today.  Also discussed  care with bedside RN who was present during visit.  Objective:   Vital Signs:  BP (!) 146/53 (BP Location: Right Arm)   Pulse 86   Temp 98.2 F (36.8 C) (Oral)   Resp 18   Ht 5' (1.524 m)   Wt 67.4 kg   SpO2 96%   BMI 29.02 kg/m   Physical Exam: General: NAD, sleepy after procedure though easily awakened, laying in bed, hard of hearing, elderly appearing Eyes: No drainage noted HENT: moist mucous membranes Cardiovascular: RRR Respiratory: no increased work of breathing noted, not in respiratory distress Abdomen: not distended Neuro: sleepy after procedure though easily awakened, hard of hearing Psych: appropriately answers simple questions at this time  Imaging:  I personally reviewed recent imaging.   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  Patient is scheduled to undergo radiation on 5/15.  Patient also receiving management for electrolyte imbalance.  Medicine team consulted to assist with management.   Recommendations/Plan: # Complex medical decision making/goals of care:  -Patient continuing with appropraite medical therapies to address her underlying medical conditions including  cancer directed therapies. Patient already DNR.   -  Code Status: DNR  # Symptom management:    -Pain, in setting of metastatic lung cancer to bones                               -  Continue MS Contin 15 mg every 12 hours scheduled                               -Continue oxycodone 2.5 mg every 4 hours as needed.  May need to consider further titration to 5 mg if 2.5 mg dose is not enough.                               -Continue Tylenol 1000 mg scheduled every 8 hours during the day.                               -Continue  lidocaine patch for right-sided ribs.                               -Considered steroids for bone pain though patient notes prednisone made her "crazy" previously.   Patient noting new abdominal pain today. Hospitalist performing workup. Discussed with patient and daughter did not want to increase doses of oral medications as currently managing bone pain and didn't want to mask abdominal pain without knowing source since not known previously to be associated with cancer.                   -Constipation States issues of constipation at home.  Patient's last bowel movement was yesterday.  Have discussed importance of bowel regimen while receiving opioids.                               -Continue MiraLAX 17 g daily                               -Continue Senna 1 tab daily.   # Psycho-social/Spiritual Support:  - Support System: daughters  # Discharge Planning: To Be Determined  -Already placed referral for follow up with PMT at [facility] on 5/12.   Discussed with: patient, RN  Thank you for allowing the palliative care team to participate in the care Courtney Brown.  Alvester Morin, DO Palliative Care Provider PMT # [phone]  If patient remains symptomatic despite maximum doses, please call PMT at [phone] between 0700 and 1900. Outside of these hours, please call attending, as PMT does not have night coverage.  *Please note that this is a verbal dictation therefore any spelling or grammatical errors are due to the "Dragon Medical One" system interpretation.

## 2022-12-08 NOTE — Op Note (Signed)
Preoperative diagnosis: lung cancer  Postoperative diagnosis: same  Procedure: insertion of RIJ port-a-cath with ultrasound and fluoro guidance  Surgeon: Feliciana Rossetti, M.D.  Asst: none  Anesthesia: gen   Indications for procedure: 82 yo female with advanced lung cancer who presents for port-o-cath insertion.  Description of procedure: The patient was brought into the operative suite, anesthesia was administered with LMA, both arms were tucked with offloading foam over all pressure points. The patient was prepped and draped in the usual sterile fashion. Next WHO checklist was completed.   The patient was put in trendelenberg, the [place] was used to assess anatomy and the RIJ was large and lay anterior the the common carotid artery A 18ga needle was used to gain access to the RIJ using ultrasound guidance. Nonpulsatile flow was seen and the J-wire was advanced without tension. XR was used to confirm placement within the venous system. No arrthymias were seen, the wire was clamped in place. The percutaneous access site was widened with a 11 blade. Local anesthesia was used to anesthetize the space inferior to the right clavicle. Next a 3cm incision was made 3cm inferior to the clavicle. Cautery was used to create a pocket along the fascia inferior to incision. The tunneling device was used to make a wide turn from the pocket up to the perc access site. The introducer and sheath were then thread over the J wire in seldinger technique and wire was removed. Next the catheter was thread from the incision to the access site and inserted into the sheath after the introducer was removed. Sheath was then pulled and removed. XR showed the tip of catheter to be at the atrial-caval junction. The port was then attached to the catheter cutting the catheter at appropriate length. The port was then placed in pocket and sewed to the fascia in two places with a 2-0 prolene. The port was accessed with huber needel and  flushed and drew easily and the port was flushed with concentrated heparin. The incision was closed with 3-0 vicryl followed with 4-0 monocryl in running subcu fashion. A single 4-0 was used to close the access site. Liqui-band was placed for dressing. Patient was extubated and brought to pacu in stable condition.  Findings: patent RIJ, catheter tip at the SVC atrial junction  Specimen: none  Blood loss: 10cc  Local anesthesia: 10cc 0.25% marcaine w epi  Complications: none  Feliciana Rossetti, M.D. General, Bariatric, & Minimally Invasive Surgery [facility]

## 2022-12-08 NOTE — Progress Notes (Signed)
OT Cancellation Note  Patient Details Name: Courtney Brown MRN: [id number] DOB: [birth year]   Cancelled Treatment:    Reason Eval/Treat Not Completed: Patient at procedure or test/ unavailable Patient is off hall at procedure. OT to continue to follow  Rosalio Loud, MS Acute Rehabilitation Department Office# [phone]  12/08/2022, 11:34 AM

## 2022-12-08 NOTE — Anesthesia Preprocedure Evaluation (Addendum)
Anesthesia Evaluation  Patient identified by MRN, date of birth, ID band Patient awake    Reviewed: Allergy & Precautions, NPO status , Patient's Chart, lab work & pertinent test results, reviewed documented beta blocker date and time   Airway Mallampati: I  TM Distance: >3 FB Neck ROM: Full    Dental  (+) Edentulous Upper, Edentulous Lower   Pulmonary COPD,  COPD inhaler, former smoker    + decreased breath sounds+ wheezing      Cardiovascular hypertension, Pt. on home beta blockers and Pt. on medications + CAD, + Past MI and + Peripheral Vascular Disease   Rhythm:Regular Rate:Normal     Neuro/Psych  PSYCHIATRIC DISORDERS Anxiety     CVA    GI/Hepatic Neg liver ROS, hiatal hernia,GERD  Medicated,,  Endo/Other  negative endocrine ROS    Renal/GU Renal disease     Musculoskeletal  (+) Arthritis ,    Abdominal   Peds  Hematology   Anesthesia Other Findings   Reproductive/Obstetrics                             Anesthesia Physical Anesthesia Plan  ASA: 3  Anesthesia Plan: General   Post-op Pain Management: Minimal or no pain anticipated   Induction: Intravenous  PONV Risk Score and Plan: 3 and Ondansetron and Treatment may vary due to age or medical condition  Airway Management Planned: LMA  Additional Equipment: None  Intra-op Plan:   Post-operative Plan: Extubation in OR  Informed Consent: I have reviewed the patients History and Physical, chart, labs and discussed the procedure including the risks, benefits and alternatives for the proposed anesthesia with the patient or authorized representative who has indicated his/her understanding and acceptance.   Patient has DNR.  Discussed DNR with patient and Continue DNR.   Dental advisory given  Plan Discussed with:   Anesthesia Plan Comments:        Anesthesia Quick Evaluation

## 2022-12-08 NOTE — H&P (Signed)
Reason for Consult:lung cancer  Courtney Brown is an 82 y.o. female.  HPI: 82 yo female with stage IV squamous cell lung cancer working with oncology for chemotherapy. She came into the hospital over the weekend due to dyspnea and nausea. Her work up has not shown evidence of infection. She has improvement.  Past Medical History:  Diagnosis Date   Allergy    Anemia    Anxiety    Arthritis    Cancer (HCC)    hx of melanoma    Colon polyps    COPD (chronic obstructive pulmonary disease) (HCC)    Coronary artery disease    Dyspnea    with exertion    Edema    in lower extremties    Emphysema of lung (HCC)    GERD (gastroesophageal reflux disease)    H/O hiatal hernia    Hyperlipidemia    Hypertension    Inverted nipple    bilateral, pt says that's normal   Lung cancer (HCC) 10/23/2022   Malignant melanoma (HCC) 1983   again in 1995]   Myocardial infarct, old 2008   no stent placed   Osteoporosis    Pneumonia    hx of    Sepsis (HCC)    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    Stroke [facility])    TIA    Past Surgical History:  Procedure Laterality Date   ABDOMINAL HYSTERECTOMY  1998   BRONCHIAL BIOPSY  10/27/2022   Procedure: BRONCHIAL BIOPSIES;  Surgeon: Leslye Peer, MD;  Location: [facility] ENDOSCOPY;  Service: Cardiopulmonary;;   BRONCHIAL BRUSHINGS  10/27/2022   Procedure: BRONCHIAL BRUSHINGS;  Surgeon: Leslye Peer, MD;  Location: [facility] ENDOSCOPY;  Service: Cardiopulmonary;;   CHOLECYSTECTOMY  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   HEMOSTASIS CONTROL  10/27/2022   Procedure: HEMOSTASIS CONTROL;  Surgeon: Leslye Peer, MD;  Location: [facility] ENDOSCOPY;  Service: Cardiopulmonary;;   HERNIA REPAIR     umbilical, ventral   INCISIONAL HERNIA REPAIR N/A 10/26/2020   Procedure: DIAGNOSTIC LAPAROSCOPY MESH EXPLANTATION; SMALL BOWEL RESECTION; PRIMARY REPAIR OF INCISIONAL HERNIA;  Surgeon: Sheliah Hatch De Blanch, MD;  Location: [facility] ORS;   Service: General;  Laterality: N/A;   INSERTION OF ILIAC STENT  2008   VIDEO BRONCHOSCOPY Right 10/27/2022   Procedure: VIDEO BRONCHOSCOPY WITHOUT FLUORO;  Surgeon: Leslye Peer, MD;  Location: [facility] ENDOSCOPY;  Service: Cardiopulmonary;  Laterality: Right;    Family History  Problem Relation Age of Onset   Arthritis Mother    Hearing loss Father    Colon cancer Brother    Hearing loss Maternal Grandmother    Cancer Maternal Grandmother        colon   Cancer Paternal Grandmother        colon   Hearing loss Maternal Aunt    Breast cancer Neg Hx     Social History:  reports that she quit smoking about 17 years ago. Her smoking use included cigarettes. She has a 66.00 pack-year smoking history. She has never used smokeless tobacco. She reports that she does not currently use alcohol after a past usage of about 2.0 standard drinks of alcohol per week. She reports that she does not use drugs.  Allergies:  Allergies  Allergen Reactions   Prednisone Other (See Comments)    Leg pain,light headed   Statins     Myalgias- per patient, she feels better off the medication    Medications:  I have reviewed the patient's current medications.  No results found for this or any previous visit (from the past 48 hour(s)).  No results found.  Review of Systems  Constitutional: Negative.   HENT: Negative.    Eyes: Negative.   Respiratory: Negative.    Cardiovascular: Negative.   Gastrointestinal:  Positive for abdominal pain and nausea.  Genitourinary: Negative.   Musculoskeletal: Negative.   Skin: Negative.   Neurological: Negative.   Endo/Heme/Allergies: Negative.   Psychiatric/Behavioral: Negative.      PE Blood pressure (!) 146/53, pulse 86, temperature 98.2 F (36.8 C), temperature source Oral, resp. rate 18, height 5' (1.524 m), weight 67.4 kg, SpO2 96 %. Constitutional: NAD; conversant; no deformities Eyes: Moist conjunctiva; no lid lag; anicteric; PERRL Neck: Trachea midline;  no thyromegaly Lungs: Normal respiratory effort; no tactile fremitus CV: RRR; no palpable thrills; no pitting edema GI: Abd soft; no palpable hepatosplenomegaly MSK: Normal gait; no clubbing/cyanosis Psychiatric: Appropriate affect; alert and oriented x3 Lymphatic: No palpable cervical or axillary lymphadenopathy Skin: No major subcutaneous nodules. Warm and dry   Assessment/Plan: 82 yo female with advanced lung cancer -[place] and fluoro port-o-cath  I reviewed last 24 h vitals and pain scores, last 48 h intake and output, last 24 h labs and trends, and last 24 h imaging results.  This care required high  level of medical decision making.   [name] 12/08/2022, 11:42 AM

## 2022-12-08 NOTE — Transfer of Care (Signed)
Immediate Anesthesia Transfer of Care Note  Patient: Courtney Brown  Procedure(s) Performed: PORT-A-CATH PLACEMENT WITH ULTRASOUND GUIDANCE  Patient Location: PACU  Anesthesia Type:General  Level of Consciousness: awake, alert , oriented, and patient cooperative  Airway & Oxygen Therapy: Patient Spontanous Breathing and Patient connected to face mask oxygen  Post-op Assessment: Report given to RN and Post -op Vital signs reviewed and stable  Post vital signs: Reviewed and stable  Last Vitals:  Vitals Value Taken Time  BP 150/49 12/08/22 1307  Temp    Pulse 81 12/08/22 1309  Resp 12 12/08/22 1309  SpO2 98 % 12/08/22 1309  Vitals shown include unvalidated device data.  Last Pain:  Vitals:   12/08/22 1139  TempSrc: Oral  PainSc:       Patients Stated Pain Goal: 0 (12/07/22 1850)  Complications: No notable events documented.

## 2022-12-08 NOTE — Anesthesia Postprocedure Evaluation (Signed)
Anesthesia Post Note  Patient: Courtney Brown  Procedure(s) Performed: PORT-A-CATH PLACEMENT WITH ULTRASOUND GUIDANCE     Patient location during evaluation: PACU Anesthesia Type: General Level of consciousness: awake and alert Pain management: pain level controlled Vital Signs Assessment: post-procedure vital signs reviewed and stable Respiratory status: spontaneous breathing, nonlabored ventilation, respiratory function stable and patient connected to nasal cannula oxygen Cardiovascular status: blood pressure returned to baseline and stable Postop Assessment: no apparent nausea or vomiting Anesthetic complications: no  No notable events documented.  Last Vitals:  Vitals:   12/08/22 1330 12/08/22 1345  BP: (!) 169/61 (!) 166/64  Pulse: 89 89  Resp: 17 14  Temp:    SpO2: 98% 97%    Last Pain:  Vitals:   12/08/22 1345  TempSrc:   PainSc: Asleep                 Shelton Silvas

## 2022-12-09 ENCOUNTER — Encounter (HOSPITAL_COMMUNITY): Payer: Self-pay | Admitting: General Surgery

## 2022-12-09 ENCOUNTER — Ambulatory Visit
Admission: RE | Admit: 2022-12-09 | Discharge: 2022-12-09 | Disposition: A | Discharge status: Home / Self Care | Payer: 59 | Source: Ambulatory Visit | Attending: Radiation Oncology | Admitting: Radiation Oncology

## 2022-12-09 ENCOUNTER — Telehealth: Payer: Self-pay | Admitting: Nurse Practitioner

## 2022-12-09 DIAGNOSIS — Z87891 Personal history of nicotine dependence: Secondary | ICD-10-CM | POA: Diagnosis not present

## 2022-12-09 DIAGNOSIS — C3411 Malignant neoplasm of upper lobe, right bronchus or lung: Secondary | ICD-10-CM | POA: Diagnosis not present

## 2022-12-09 DIAGNOSIS — C3491 Malignant neoplasm of unspecified part of right bronchus or lung: Secondary | ICD-10-CM

## 2022-12-09 LAB — CBC WITH DIFFERENTIAL/PLATELET
Abs Immature Granulocytes: 0.29 10*3/uL — ABNORMAL HIGH (ref 0.00–0.07)
Basophils Absolute: 0.1 10*3/uL (ref 0.0–0.1)
Basophils Relative: 0 %
Eosinophils Absolute: 0.5 10*3/uL (ref 0.0–0.5)
Eosinophils Relative: 2 %
HCT: 33.7 % — ABNORMAL LOW (ref 36.0–46.0)
Hemoglobin: 9.7 g/dL — ABNORMAL LOW (ref 12.0–15.0)
Immature Granulocytes: 1 %
Lymphocytes Relative: 3 %
Lymphs Abs: 0.9 10*3/uL (ref 0.7–4.0)
MCH: 24.3 pg — ABNORMAL LOW (ref 26.0–34.0)
MCHC: 28.8 g/dL — ABNORMAL LOW (ref 30.0–36.0)
MCV: 84.5 fL (ref 80.0–100.0)
Monocytes Absolute: 2.2 10*3/uL — ABNORMAL HIGH (ref 0.1–1.0)
Monocytes Relative: 7 %
Neutro Abs: 26.2 10*3/uL — ABNORMAL HIGH (ref 1.7–7.7)
Neutrophils Relative %: 87 %
Platelets: 557 10*3/uL — ABNORMAL HIGH (ref 150–400)
RBC: 3.99 MIL/uL (ref 3.87–5.11)
RDW: 17.3 % — ABNORMAL HIGH (ref 11.5–15.5)
WBC: 30.1 10*3/uL — ABNORMAL HIGH (ref 4.0–10.5)
nRBC: 0 % (ref 0.0–0.2)

## 2022-12-09 MED ORDER — PIPERACILLIN-TAZOBACTAM 3.375 G IVPB
3.3750 g | Freq: Three times a day (TID) | INTRAVENOUS | Status: DC
  Administered 2022-12-09 – 2022-12-14 (×14): 3.375 g via INTRAVENOUS
  Filled 2022-12-09 (×14): qty 50

## 2022-12-09 MED ORDER — OXYCODONE HCL 5 MG PO TABS
5.0000 mg | ORAL_TABLET | ORAL | Status: DC | PRN
  Administered 2022-12-10 – 2022-12-24 (×36): 5 mg via ORAL
  Filled 2022-12-09 (×38): qty 1

## 2022-12-09 MED ORDER — ALBUTEROL SULFATE (2.5 MG/3ML) 0.083% IN NEBU
3.0000 mL | INHALATION_SOLUTION | RESPIRATORY_TRACT | Status: DC | PRN
  Administered 2022-12-09 – 2022-12-24 (×7): 3 mL via RESPIRATORY_TRACT
  Filled 2022-12-09 (×7): qty 3

## 2022-12-09 MED ORDER — SODIUM CHLORIDE 0.9 % IV SOLN
1.0000 g | Freq: Two times a day (BID) | INTRAVENOUS | Status: DC
  Administered 2022-12-09: 1 g via INTRAVENOUS
  Filled 2022-12-09 (×2): qty 20

## 2022-12-09 NOTE — Progress Notes (Signed)
Daily Progress Note   Patient Name: Courtney Brown       Date: 12/09/2022 DOB: [birth year]  Age: 82 y.o. MRN#: [id number] Attending Physician: Dorcas Carrow, MD Primary Care Physician: [name], MD Admit Date: 12/05/2022 Length of Stay: 3 days  Reason for Consultation/Follow-up: Pain control  Subjective:   CC: Patient resting in bed, appears weak, complains of pain.    Subjective:  Reviewed EMR prior to presenting to bedside. Patient has not received any PO PRN Oxycodone in the past 24 hours, she is on MS Contin 15 mg PO Q 12 hours, she got IV Dilaudid 1 mg and 0.5 mg : 2 doses in past 24 hours.     Objective:   Vital Signs:  BP (!) 143/53 (BP Location: Left Arm)   Pulse 84   Temp 97.6 F (36.4 C) (Oral)   Resp 18   Ht 5' (1.524 m)   Wt 67.4 kg   SpO2 98%   BMI 29.02 kg/m   Physical Exam: General: NAD, sleepy after procedure though easily awakened, laying in bed, hard of hearing, elderly appearing Eyes: No drainage noted HENT: moist mucous membranes Cardiovascular: RRR Respiratory: no increased work of breathing noted, not in respiratory distress Abdomen: not distended Neuro: sleepy after procedure though easily awakened, hard of hearing Psych: appropriately answers simple questions at this time  Imaging:  I personally reviewed recent imaging.   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  Patient is scheduled to undergo radiation on 5/15.  Patient also receiving management for electrolyte imbalance.  Medicine team consulted to assist with management.   Recommendations/Plan: # Complex medical decision making/goals of care:  -Patient continuing with appropriate medical therapies to address her  underlying medical conditions including  cancer directed therapies. Patient already DNR.   -  Code Status: DNR  # Symptom management:    -Pain, in setting of metastatic lung cancer to bones                               -Continue MS Contin 15 mg every 12 hours scheduled                               - oxycodone  increased to 5 mg PO PRN.                                -Continue Tylenol 1000 mg scheduled every 8 hours during the day.                               -Continue lidocaine patch for right-sided ribs.                                                  -Constipation States issues of constipation at home.  Patient's last bowel movement was yesterday.  Have discussed importance of bowel regimen while receiving opioids.                               -  Continue MiraLAX 17 g daily                               -Continue Senna 1 tab daily.   # Psycho-social/Spiritual Support:  - Support System: daughters  # Discharge Planning: To Be Determined  -Already placed referral for follow up with PMT at [facility] on 5/12.   Discussed with: patient, RN  Thank you for allowing the palliative care team to participate in the care Shamir Coreas. Low MDM Rosalin Hawking MD Palliative Care Provider PMT # [phone]  If patient remains symptomatic despite maximum doses, please call PMT at [phone] between 0700 and 1900. Outside of these hours, please call attending, as PMT does not have night coverage.  *Please note that this is a verbal dictation therefore any spelling or grammatical errors are due to the "Dragon Medical One" system interpretation.

## 2022-12-09 NOTE — Progress Notes (Signed)
Pharmacy Antibiotic Note  Courtney Brown is a 82 y.o. female with hx of metastatic lung cancer admitted on 12/05/2022 with complaint shortness of breath. Empirically was started on Zosyn for PNA but was d/c on 5/11. Abdominal CT on 5/13  showed new acute pancreatitis with markedly elevated WBC on 5/14. Pharmacy has been consulted to start meropenem for intra-abdominal infection.    Plan: - Meropenem 1g q12H - Monitor clinical status, renal function, follow culture results  Height: 5' (152.4 cm) Weight: 67.4 kg (148 lb 9.4 oz) IBW/kg (Calculated) : 45.5  Temp (24hrs), Avg:97.8 F (36.6 C), Min:97.5 F (36.4 C), Max:98.2 F (36.8 C)  Recent Labs  Lab 12/05/22 1439 12/06/22 0622 12/08/22 1105 12/08/22 1530 12/09/22 0556  WBC 20.3* 17.5*  --  25.4* 30.1*  CREATININE 0.62 0.59 0.54  --   --   LATICACIDVEN  --   --   --  1.5  --     Estimated Creatinine Clearance: 47.3 mL/min (by C-G formula based on SCr of 0.54 mg/dL).    Allergies  Allergen Reactions   Prednisone Other (See Comments)    Leg pain,light headed   Statins     Myalgias- per patient, she feels better off the medication       Thank you for allowing pharmacy to be a part of this patient's care.  Mack Guise 12/09/2022 10:10 AM

## 2022-12-09 NOTE — Progress Notes (Signed)
OT Cancellation Note  Patient Details Name: Courtney Brown MRN: [id number] DOB: 03/13/1941   Cancelled Treatment:    Reason Eval/Treat Not Completed: Patient declined, no reason specified Patient declined reporting increased pain in abdomen in bed. Patient asking for therapy to check on her this afternoon. OT to continue to follow and check back as schedule will allow.  Rosalio Loud, MS Acute Rehabilitation Department Office# [phone]  12/09/2022, 10:52 AM

## 2022-12-09 NOTE — Progress Notes (Signed)
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  She was recently started on Vicodin and that was not sufficient.  Over the last 3 days she had been without any pain medication having increasing pain and shortness of breath so presented to the ER. Hemodynamically stable in the ER.  CT angio of the chest abdomen pelvis with necrotic right upper lung mass increased in size from recent exam with bony destruction of the third and fourth rib on the right side, multiple pulmonary nodules and also metastatic lesion on the left lung.  Admitted due to unrelenting cancer related pain. 5/13, patient started complaining of severe epigastric pain and found to have acute pancreatitis.  Medically stabilizing.   Assessment & Plan:   Stage IV lung cancer, cancer related pain uncontrolled with oral regimen at home: Currently hemodynamically stable. Started on MS Contin 15 mg twice daily, oxycodone 2.5 mg every 4 hours for breakthrough pain, Tylenol 1 g 3 times daily as a scheduled, lidocaine patches right chest wall. Musculoskeletal pain is controlled, however patient has new epigastric pain now.  Acute pancreatitis without complication: Patient complained of severe epigastric pain on 5/13.  CT scan showed acute pancreatitis without complication or necrosis. WBC count elevated 26-30,000.  Lipase minimally elevated. Resuscitated with IV fluids, currently remains on maintenance IV fluids.  Adequate pain medications. No evidence of bacterial infection, leukocytosis is likely reactive however will draw blood cultures and start patient on meropenem for intra-abdominal infection. Primary cause unknown.  Patient does not have  gallbladder.  She does not drink alcohol.  This is likely related to acute distress.  Hopefully will resolve with conservative management.  Stage IV lung cancer: Followed by cancer center in [place]. Was scheduled outpatient Port-A-Cath, done in the hospital 5/13.   She is a scheduled for CT simulation tomorrow with radiation and hoping for her to stay due to acute pancreatitis.    Hypomagnesemia: Replaced.  Resolved.  Continue to monitor.  Suspected pneumonia: Ruled out.  Chronic medical issues including COPD, stable Hypertension, stable on Toprol.    DVT prophylaxis: heparin injection 5,000 Units Start: 12/06/22 0600 SCDs Start: 12/05/22 2355   Code Status: DNR, documented in the chart Family Communication: Daughter on the phone Disposition Plan: Status is: Inpatient Remains inpatient appropriate because: Significant cancer related pain, acute pancreatitis, inpatient procedures.     Consultants:  Palliative care  Procedures:  None  Antimicrobials:  Meropenem 5/14---   Subjective:  Patient was seen and examined.  Still has moderate pain but better than yesterday.  She was okay taking some clears.  Pain is controlled, used multiple injectables overnight.  Currently denies any nausea.  Today she has more wheezing and needs her inhaler.  No family at the bedside.  Objective: Vitals:   12/08/22 2136 12/09/22 0843 12/09/22 1318 12/09/22 1321  BP: (!) 143/53   (!) 141/50  Pulse: 84   93  Resp: 18   20  Temp: 97.6 F (36.4 C)   98.2 F (36.8 C)  TempSrc: Oral   Axillary  SpO2: 97% 98% 97% 99%  Weight:      Height:        Intake/Output Summary (Last 24 hours) at 12/09/2022 1343 Last data filed  at 12/09/2022 0300 Gross per 24 hour  Intake 306.39 ml  Output --  Net 306.39 ml   Filed Weights   12/07/22 0522 12/08/22 0512 12/08/22 1139  Weight: 65.7 kg 67.4 kg 67.4 kg    Examination:  General exam: Appears in mild distress, otherwise comfortable. Respiratory  system: Mostly clear.  Upper airway sounds.  No added sounds.  On room air. Cardiovascular system: S1 & S2 heard, RRR. No pedal edema. Gastrointestinal system: Soft.  Mild diffuse tenderness along the epigastrium.  No rigidity or guarding.  Bowel sound present. Central nervous system: Alert and oriented. No focal neurological deficits. Extremities: Symmetric 5 x 5 power. Skin: No rashes, lesions or ulcers   Data Reviewed: I have personally reviewed following labs and imaging studies  CBC: Recent Labs  Lab 12/05/22 1439 12/06/22 0622 12/08/22 1530 12/09/22 0556  WBC 20.3* 17.5* 25.4* 30.1*  NEUTROABS 17.1* 13.6* 22.1* 26.2*  HGB 9.3* 9.4* 10.0* 9.7*  HCT 31.2* 32.5* 33.2* 33.7*  MCV 82.1 84.0 80.8 84.5  PLT 738* 609* 656* 557*   Basic Metabolic Panel: Recent Labs  Lab 12/05/22 1439 12/06/22 0622 12/08/22 1105 12/08/22 1530  NA 138 139 138  --   K 3.4* 3.6 3.6  --   CL 101 103 103  --   CO2 31 29 28   --   GLUCOSE 125* 103* 127*  --   BUN 10 9 12   --   CREATININE 0.62 0.59 0.54  --   CALCIUM 10.4* 9.9 9.5  --   MG  --  1.6*  --  1.5*  PHOS  --   --   --  3.6   GFR: Estimated Creatinine Clearance: 47.3 mL/min (by C-G formula based on SCr of 0.54 mg/dL). Liver Function Tests: Recent Labs  Lab 12/05/22 1439 12/06/22 0622  AST 18 22  ALT 10 10  ALKPHOS 48 45  BILITOT 0.6 0.4  PROT 5.7* 5.4*  ALBUMIN 1.8* 1.8*   Recent Labs  Lab 12/05/22 1439 12/08/22 1530  LIPASE 22 162*   No results for input(s): "AMMONIA" in the last 168 hours. Coagulation Profile: No results for input(s): "INR", "PROTIME" in the last 168 hours. Cardiac Enzymes: No results for input(s): "CKTOTAL", "CKMB", "CKMBINDEX", "TROPONINI" in the last 168 hours. BNP (last 3 results) No results for input(s): "PROBNP" in the last 8760 hours. HbA1C: No results for input(s): "HGBA1C" in the last 72 hours. CBG: No results for input(s): "GLUCAP" in the last 168 hours. Lipid Profile: No results for  input(s): "CHOL", "HDL", "LDLCALC", "TRIG", "CHOLHDL", "LDLDIRECT" in the last 72 hours. Thyroid Function Tests: No results for input(s): "TSH", "T4TOTAL", "FREET4", "T3FREE", "THYROIDAB" in the last 72 hours. Anemia Panel: No results for input(s): "VITAMINB12", "FOLATE", "FERRITIN", "TIBC", "IRON", "RETICCTPCT" in the last 72 hours. Sepsis Labs: Recent Labs  Lab 12/06/22 0622 12/08/22 1530  PROCALCITON <0.10  --   LATICACIDVEN  --  1.5    Recent Results (from the past 240 hour(s))  Urine Culture     Status: Abnormal   Collection Time: 12/05/22  2:39 PM   Specimen: Urine, Clean Catch  Result Value Ref Range Status   Specimen Description   Final    URINE, CLEAN CATCH Performed at [facility], [address], [place], [place] [address]    Special Requests   Final    NONE Performed at [facility], [address] [place], [place] [address]    Culture MULTIPLE SPECIES PRESENT, SUGGEST RECOLLECTION (  A)  Final   Report Status 12/06/2022 FINAL  Final  Culture, blood (single) w Reflex to ID Panel     Status: None (Preliminary result)   Collection Time: 12/08/22  6:19 PM   Specimen: BLOOD LEFT ARM  Result Value Ref Range Status   Specimen Description   Final    BLOOD LEFT ARM Performed at [facility], [address] [place], [place] [address]    Special Requests   Final    BOTTLES DRAWN AEROBIC AND ANAEROBIC Blood Culture results may not be optimal due to an inadequate volume of blood received in culture bottles Performed at [facility], [address], [place], [place] [address]    Culture   Final    NO GROWTH < 12 HOURS Performed at [facility], [address], [place], [place] [address]    Report Status PENDING  Incomplete         Radiology Studies: CT ABDOMEN PELVIS W CONTRAST  Result Date: 12/08/2022 CLINICAL DATA:  Severe acute epigastric abdominal pain. History of lung carcinoma. EXAM: CT  ABDOMEN AND PELVIS WITH CONTRAST TECHNIQUE: Multidetector CT imaging of the abdomen and pelvis was performed using the standard protocol following bolus administration of intravenous contrast. RADIATION DOSE REDUCTION: This exam was performed according to the departmental dose-optimization program which includes automated exposure control, adjustment of the mA and/or kV according to patient size and/or use of iterative reconstruction technique. CONTRAST:  OMNIPAQUE IOHEXOL 300 MG/ML  SOLN COMPARISON:  CT of the chest, abdomen and pelvis on 12/05/2022 FINDINGS: Lower chest: Stable small right pleural effusion and trace left pleural fluid. Hepatobiliary: No focal liver abnormality is seen. Status post cholecystectomy. No biliary dilatation. Pancreas: New acute pancreatitis since the prior scan with peripancreatic inflammation and edema surrounding the entire pancreas. No evidence of pancreatic necrosis, pseudocyst or pancreatic ductal dilatation. No associated pancreatic masses. Spleen: Normal in size without focal abnormality. Adrenals/Urinary Tract: Adrenal glands are unremarkable. Stable appearance of kidneys including bilateral cysts and a probable hemorrhagic cyst of the left upper pole. Bladder is unremarkable. Stomach/Bowel: No bowel obstruction or free intraperitoneal air. Potential mild ileus involving the ascending and transverse colon. Vascular/Lymphatic: Stable atherosclerosis of the abdominal aorta and iliac arteries without aneurysm. Reproductive: Status post hysterectomy. No adnexal masses. Other: New free fluid in the pelvis.  No discrete abscess. Musculoskeletal: No acute or significant osseous findings. IMPRESSION: 1. New acute pancreatitis with peripancreatic inflammation and edema surrounding the entire pancreas. No evidence of pancreatic necrosis, pseudocyst or pancreatic ductal dilatation. 2. New free fluid in the pelvis. 3. Potential mild ileus involving the ascending and transverse colon.  4. Stable small right pleural effusion and trace left pleural fluid. 5. Stable atherosclerosis of the abdominal aorta and iliac arteries. Aortic Atherosclerosis (ICD10-I70.0). Electronically Signed   By: Irish Lack M.D.   On: 12/08/2022 18:01   DG C-Arm 1-60 Min-No Report  Result Date: 12/08/2022 Fluoroscopy was utilized by the requesting physician.  No radiographic interpretation.        Scheduled Meds:  acetaminophen  1,000 mg Oral Q8H   ALPRAZolam  0.25 mg Oral QHS   heparin  5,000 Units Subcutaneous Q8H   lidocaine  1 patch Transdermal Q24H   metoprolol succinate  50 mg Oral Daily   mometasone-formoterol  2 puff Inhalation BID   morphine  15 mg Oral Q12H   nystatin  5 mL Mouth/Throat QID   pantoprazole  40 mg Oral Daily  polyethylene glycol  17 g Oral Daily   primidone  50 mg Oral QHS   senna  1 tablet Oral Daily   Continuous Infusions:  lactated ringers 10 mL/hr at 12/08/22 1147   lactated ringers 100 mL/hr at 12/09/22 0514   meropenem (MERREM) IV 1 g (12/09/22 0940)      LOS: 3 days    Time spent: 35 minutes    Dorcas Carrow, MD [facility] Pager [phone]

## 2022-12-09 NOTE — Progress Notes (Signed)
OT Cancellation Note  Patient Details Name: Courtney Brown MRN: [id number] DOB: 1940-09-08   Cancelled Treatment:    Reason Eval/Treat Not Completed: Patient at procedure or test/ unavailable Patient is off the hall with cancer center. OT to continue to follow and check back as schedule will allow Rosalio Loud, MS Acute Rehabilitation Department Office# [phone]  12/09/2022, 3:10 PM

## 2022-12-10 ENCOUNTER — Ambulatory Visit: Payer: 59 | Admitting: Oncology

## 2022-12-10 ENCOUNTER — Encounter: Payer: Self-pay | Admitting: Oncology

## 2022-12-10 ENCOUNTER — Inpatient Hospital Stay (HOSPITAL_COMMUNITY): Payer: 59

## 2022-12-10 DIAGNOSIS — C3411 Malignant neoplasm of upper lobe, right bronchus or lung: Secondary | ICD-10-CM | POA: Diagnosis not present

## 2022-12-10 DIAGNOSIS — Z87891 Personal history of nicotine dependence: Secondary | ICD-10-CM | POA: Diagnosis not present

## 2022-12-10 DIAGNOSIS — C3491 Malignant neoplasm of unspecified part of right bronchus or lung: Secondary | ICD-10-CM | POA: Diagnosis not present

## 2022-12-10 LAB — CBC WITH DIFFERENTIAL/PLATELET
Abs Immature Granulocytes: 0.43 10*3/uL — ABNORMAL HIGH (ref 0.00–0.07)
Basophils Absolute: 0.1 10*3/uL (ref 0.0–0.1)
Basophils Relative: 0 %
Eosinophils Absolute: 0.5 10*3/uL (ref 0.0–0.5)
Eosinophils Relative: 2 %
HCT: 32.9 % — ABNORMAL LOW (ref 36.0–46.0)
Hemoglobin: 9.8 g/dL — ABNORMAL LOW (ref 12.0–15.0)
Immature Granulocytes: 1 %
Lymphocytes Relative: 4 %
Lymphs Abs: 1.3 10*3/uL (ref 0.7–4.0)
MCH: 24.4 pg — ABNORMAL LOW (ref 26.0–34.0)
MCHC: 29.8 g/dL — ABNORMAL LOW (ref 30.0–36.0)
MCV: 82 fL (ref 80.0–100.0)
Monocytes Absolute: 2.8 10*3/uL — ABNORMAL HIGH (ref 0.1–1.0)
Monocytes Relative: 8 %
Neutro Abs: 29.1 10*3/uL — ABNORMAL HIGH (ref 1.7–7.7)
Neutrophils Relative %: 85 %
Platelets: 531 10*3/uL — ABNORMAL HIGH (ref 150–400)
RBC: 4.01 MIL/uL (ref 3.87–5.11)
RDW: 17.3 % — ABNORMAL HIGH (ref 11.5–15.5)
WBC: 34.3 10*3/uL — ABNORMAL HIGH (ref 4.0–10.5)
nRBC: 0 % (ref 0.0–0.2)

## 2022-12-10 LAB — COMPREHENSIVE METABOLIC PANEL
ALT: 14 U/L (ref 0–44)
AST: 30 U/L (ref 15–41)
Albumin: 1.5 g/dL — ABNORMAL LOW (ref 3.5–5.0)
Alkaline Phosphatase: 74 U/L (ref 38–126)
Anion gap: 7 (ref 5–15)
BUN: 8 mg/dL (ref 8–23)
CO2: 24 mmol/L (ref 22–32)
Calcium: 8.7 mg/dL — ABNORMAL LOW (ref 8.9–10.3)
Chloride: 105 mmol/L (ref 98–111)
Creatinine, Ser: 0.49 mg/dL (ref 0.44–1.00)
GFR, Estimated: >60 mL/min (ref >60)
Glucose, Bld: 89 mg/dL (ref 70–99)
Potassium: 3.8 mmol/L (ref 3.5–5.1)
Sodium: 136 mmol/L (ref 135–145)
Total Bilirubin: 0.3 mg/dL (ref 0.3–1.2)
Total Protein: 4.6 g/dL — ABNORMAL LOW (ref 6.5–8.1)

## 2022-12-10 LAB — CULTURE, BLOOD (SINGLE)

## 2022-12-10 LAB — LIPASE, BLOOD: Lipase: 30 U/L (ref 11–51)

## 2022-12-10 LAB — MAGNESIUM: Magnesium: 1.3 mg/dL — ABNORMAL LOW (ref 1.7–2.4)

## 2022-12-10 MED ORDER — MAGNESIUM SULFATE 2 GM/50ML IV SOLN
2.0000 g | Freq: Once | INTRAVENOUS | Status: AC
  Administered 2022-12-10: 2 g via INTRAVENOUS
  Filled 2022-12-10: qty 50

## 2022-12-10 MED ORDER — FUROSEMIDE 10 MG/ML IJ SOLN
40.0000 mg | Freq: Once | INTRAMUSCULAR | Status: AC
  Administered 2022-12-10: 40 mg via INTRAVENOUS
  Filled 2022-12-10: qty 4

## 2022-12-10 MED ORDER — SENNA 8.6 MG PO TABS
2.0000 | ORAL_TABLET | Freq: Every day | ORAL | Status: DC
  Administered 2022-12-11 – 2022-12-14 (×3): 17.2 mg via ORAL
  Filled 2022-12-10 (×4): qty 2

## 2022-12-10 MED ORDER — POLYETHYLENE GLYCOL 3350 17 G PO PACK
17.0000 g | PACK | Freq: Two times a day (BID) | ORAL | Status: DC
  Administered 2022-12-10 – 2022-12-14 (×5): 17 g via ORAL
  Filled 2022-12-10 (×8): qty 1

## 2022-12-10 NOTE — Evaluation (Signed)
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 PMH: arthritis, melanoma, COPD, emphysema, MI, OP, PNA, TIA   Clinical Impression   The pt presented with subjective reports of generalized weakness, feelings of shakiness in standing, and lightheadedness with progressive activity. She was further noted to be with compromised endurance and increased shortness of breath with activity. She was noted to be on 3L O2 via nasal cannula; she did not use O2 at her baseline. She will benefit from further OT services to maximize her independence with self-care tasks & to decrease the risk for restricted participation in meaningful activities.      Recommendations for follow up therapy are one component of a multi-disciplinary discharge planning process, led by the attending physician.  Recommendations may be updated based on patient status, additional functional criteria and insurance authorization.   Assistance Recommended at Discharge Intermittent Supervision/Assistance  Patient can return home with the following Assistance with cooking/housework;Assist for transportation;Help with stairs or ramp for entrance;A little help with walking and/or transfers;A little help with bathing/dressing/bathroom    Functional Status Assessment  Patient has had a recent decline in their functional status and demonstrates the ability to make significant improvements in function in a reasonable and predictable amount of time.  Equipment Recommendations  BSC/3in1       Precautions / Restrictions Precautions Precaution Comments: monitor sats Restrictions Weight Bearing Restrictions: No      Mobility Bed Mobility Overal bed  mobility: Needs Assistance Bed Mobility: Supine to Sit, Sit to Supine     Supine to sit: Min guard, HOB elevated Sit to supine: Supervision   General bed mobility comments: increased time    Transfers Overall transfer level: Needs assistance Equipment used: Rolling walker (2 wheels) Transfers: Sit to/from Stand Sit to Stand: Min guard                  Balance       Sitting balance - Comments: static sitting-good. dynamic sitting-fair+       Standing balance comment: Min guard with RW             ADL either performed or assessed with clinical judgement   ADL Overall ADL's : Needs assistance/impaired Eating/Feeding: Independent;Sitting Eating/Feeding Details (indicate cue type and reason): based on clinical judgement Grooming: Set up;Sitting Grooming Details (indicate cue type and reason): simulated seated EOB         Upper Body Dressing : Set up;Sitting Upper Body Dressing Details (indicate cue type and reason): simulated seated EOB Lower Body Dressing: Moderate assistance Lower Body Dressing Details (indicate cue type and reason): Pt was limited by deconditioning and compromised endurance                     Vision Baseline Vision/History: 1 Wears glasses              Pertinent Vitals/Pain Pain Assessment Pain Assessment:  (She did not report having pain during the session.)     Hand Dominance Right   Extremity/Trunk Assessment Upper Extremity Assessment Upper Extremity Assessment: Overall WFL for tasks assessed (BUE grip strength 4/5)   Lower Extremity Assessment Lower Extremity Assessment:  (BLE AROM WFL. Slight generalized weakness noted.)  Communication Communication Communication: HOH   Cognition Arousal/Alertness: Awake/alert Behavior During Therapy: WFL for tasks assessed/performed Overall Cognitive Status: Within Functional Limits for tasks assessed              General Comments: Orientedx4, able to follow  commands consistently                Home Living Family/patient expects to be discharged to:: Private residence Living Arrangements: Children (daughter) Available Help at Discharge: Family;Available PRN/intermittently Type of Home: Mobile home Home Access: Ramped entrance     Home Layout: One level     Bathroom Shower/Tub: Tub/shower unit         Home Equipment: Agricultural consultant (2 wheels);Rollator (4 wheels)          Prior Functioning/Environment Prior Level of Function : Independent/Modified Independent             Mobility Comments:  (She typically furniture walks in the home, however has a rollator she recently started using.) ADLs Comments:  (Prior to a couple weeks ago when her symptoms started, she was independent with ADLs, driving, and sharing household chores with her daughter.)        OT Problem List: Decreased strength;Decreased activity tolerance;Impaired balance (sitting and/or standing);Cardiopulmonary status limiting activity      OT Treatment/Interventions: Self-care/ADL training;Therapeutic exercise;Energy conservation;Therapeutic activities;DME and/or AE instruction;Balance training    OT Goals(Current goals can be found in the care plan section) Acute Rehab OT Goals Patient Stated Goal: to return home and to get back to her normal self OT Goal Formulation: With patient Time For Goal Achievement: 12/24/22 Potential to Achieve Goals: Good ADL Goals Pt Will Perform Grooming: with supervision;standing Pt Will Perform Upper Body Dressing: with set-up;sitting Pt Will Perform Lower Body Dressing: with supervision;sit to/from stand Pt Will Transfer to Toilet: with supervision;ambulating Pt Will Perform Toileting - Clothing Manipulation and hygiene: with supervision;sit to/from stand  OT Frequency: Min 1X/week       AM-PAC OT "6 Clicks" Daily Activity     Outcome Measure Help from another person eating meals?: None Help from another person  taking care of personal grooming?: A Little Help from another person toileting, which includes using toliet, bedpan, or urinal?: A Lot Help from another person bathing (including washing, rinsing, drying)?: A Lot Help from another person to put on and taking off regular upper body clothing?: A Little Help from another person to put on and taking off regular lower body clothing?: A Lot 6 Click Score: 16   End of Session Equipment Utilized During Treatment: Rolling walker (2 wheels) Nurse Communication: Mobility status  Activity Tolerance: Other (comment) (Fair tolerance. Limited by compromised endurance) Patient left: in bed;with call bell/phone within reach;with bed alarm set;with family/visitor present  OT Visit Diagnosis: Unsteadiness on feet (R26.81);Muscle weakness (generalized) (M62.81)                Time: 1610-9604 OT Time Calculation (min): 21 min Charges:  OT General Charges $OT Visit: 1 Visit OT Evaluation $OT Eval Moderate Complexity: 1 Mod   Donta Fuster L Latavion Halls, OTR/L 12/10/2022, 4:44 PM

## 2022-12-10 NOTE — Progress Notes (Signed)
Daily Progress Note   Patient Name: Courtney Brown       Date: 12/10/2022 DOB: [birth year]  Age: 82 y.o. MRN#: [id number] Attending Physician: Burnadette Pop, MD Primary Care Physician: [name], MD Admit Date: 12/05/2022 Length of Stay: 4 days  Reason for Consultation/Follow-up: Pain control  Subjective:   CC: Patient resting in chair, complains of constipation, pain better controlled,     Subjective:  Reviewed EMR prior to presenting to bedside. Also discussed with RN.     Objective:   Vital Signs:  BP (!) 149/55 (BP Location: Left Arm)   Pulse 91   Temp 97.9 F (36.6 C) (Oral)   Resp 18   Ht 5' (1.524 m)   Wt 67.4 kg   SpO2 99%   BMI 29.02 kg/m   Physical Exam: General: NAD, sitting in chair No distress   hard of hearing, elderly appearing Eyes: No drainage noted HENT: moist mucous membranes Cardiovascular: RRR Respiratory: no increased work of breathing noted, not in respiratory distress Abdomen: not distended Neuro: awake alert  Psych: appropriately answers simple questions at this time  Imaging:  I personally reviewed recent imaging.   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  Patient is scheduled to undergo radiation on 5/15.  Patient also receiving management for electrolyte imbalance.  Medicine team consulted to assist with management.   Recommendations/Plan: # Complex medical decision making/goals of care:  -Patient continuing with appropriate medical therapies to address her underlying medical conditions including  cancer directed therapies. Patient already DNR.   -  Code Status: DNR  # Symptom management:    -Pain, in setting of metastatic lung cancer to bones                                -Continue MS Contin 15 mg every 12 hours scheduled                               - oxycodone   to 5 mg PO PRN.                                -Continue Tylenol 1000 mg scheduled every 8 hours during the day.                               -Continue lidocaine patch for right-sided ribs.                                                  -Constipation States issues of constipation at home.     Have discussed importance of bowel regimen while receiving opioids.                                 MiraLAX 17 g  to BID                               -  Continue Senna 2 tab daily.   # Psycho-social/Spiritual Support:  - Support System: daughters  # Discharge Planning: To Be Determined  -Already placed referral for follow up with PMT at [facility] on 5/12.   Discussed with: patient, RN  Thank you for allowing the palliative care team to participate in the care Ethelle Stigger. Mod MDM Rosalin Hawking MD Palliative Care Provider PMT # [phone]  If patient remains symptomatic despite maximum doses, please call PMT at [phone] between 0700 and 1900. Outside of these hours, please call attending, as PMT does not have night coverage.  *Please note that this is a verbal dictation therefore any spelling or grammatical errors are due to the "[employer] One" system interpretation.

## 2022-12-10 NOTE — Progress Notes (Signed)
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  On presentation she was hemodynamically stable.  CT angiogram of the chest /abdomen/pelvis showed necrotic right upper lung mass with increased size from recent exam with bony destruction of the third and fourth rib on the right side, multiple pulmonary disease, metastatic lesion on the left lung.  She was admitted for intractable pain from cancer.  Hospital course also remarkable for severe epigastric pain, found to have pancreatitis.  Assessment & Plan:  Principal Problem:   Stage IV squamous cell carcinoma of right lung (HCC) Active Problems:   Acute respiratory failure with hypoxia (HCC)   Cancer related pain   COPD GOLD II   HTN (hypertension), benign   DNR (do not resuscitate)/DNI(Do Not Intubate)   Asymptomatic bacteriuria   Cancer associated pain   Malignant neoplasm of upper lobe of right lung (HCC)   High risk medication use   Palliative care encounter   Counseling and coordination of care   Medication management  Stage IV lung cancer/cancer related pain: Continue pain medications as ordered.  Currently hemodynamically stable.  On MS Contin, oxycodone, Tylenol, lidocaine patches. Follows with oncology as an outpatient.  Port-A-Cath placed on 5/13.  She is a scheduled for CT simulation for radiation treatment.  Acute pancreatitis: Complained of severe epigastric pain on 5/13.  CT scan showed acute pancreatitis without complication or necrosis.  Lipase minimally elevated but now has resolved.  Currently on IV fluids, pain medications.  Currently on clear liquid diet.   IV fluids will be discontinued due to concern of  volume overload.  Diet advanced to soft.  She denies any nausea or abdominal pain today.  Leukocytosis: Unclear etiology but most likely secondary to malignancy.  Cultures have been negative so far.  Patient has been empirically started on Zosyn for rule out intra-abdominal infection.  Does not have fever.  Procalcitonin is nonreassuring Continue to monitor  Diastolic CHF: Has bilateral lower extremity edema.  IV fluids will be discontinued.  Has some crackles on the left lung.  Will check chest x-ray.  Last echo showed EF of 55 to 60%, grade 1 diastolic dysfunction.We will check BNP  Hypomagnesemia: Currently being monitored and supplemented as needed  COPD: Currently without exacerbation.  Continue as needed bronchodilators  Hypertension: On Toprol.  Monitor blood pressure  Constipation: Continue bowel regimen  Goals of care: Poor prognosis secondary to stage IV lung cancer with mets.  Palliative care following for goals of care.  CODE STATUS is DNR         DVT prophylaxis:heparin injection 5,000 Units Start: 12/06/22 0600 SCDs Start: 12/05/22 2355     Code Status: DNR  Family Communication: None at bedside  Patient status:Inpatient  Patient is from :home  Anticipated discharge WJ:XBJY  Estimated DC date:1-2 days   Consultants: General surgery  Procedures:Port a cath placement  Antimicrobials:  Anti-infectives (From admission, onward)    Start     Dose/Rate Route Frequency Ordered Stop   12/09/22 1800  piperacillin-tazobactam (ZOSYN) IVPB 3.375 g        3.375 g 12.5 mL/hr over 240 Minutes Intravenous Every 8 hours 12/09/22 1448     12/09/22 0800  meropenem (MERREM) 1 g  in sodium chloride 0.9 % 100 mL IVPB  Status:  Discontinued        1 g 200 mL/hr over 30 Minutes Intravenous Every 12 hours 12/09/22 0756 12/09/22 1448   12/08/22 1200  ceFAZolin (ANCEF) IVPB 2g/100 mL premix        2 g 200 mL/hr over 30 Minutes Intravenous On call to O.R. 12/08/22 1114 12/08/22  1233   12/05/22 2300  piperacillin-tazobactam (ZOSYN) IVPB 3.375 g  Status:  Discontinued        3.375 g 12.5 mL/hr over 240 Minutes Intravenous Every 8 hours 12/05/22 2238 12/06/22 1356   12/05/22 1700  cefTRIAXone (ROCEPHIN) 1 g in sodium chloride 0.9 % 100 mL IVPB        1 g 200 mL/hr over 30 Minutes Intravenous  Once 12/05/22 1654 12/05/22 2107       Subjective: Patient seen and examined the bedside today.  Sitting in the chair.  She feels that her abdominal pain is better today.  She has minimal discomfort in the epigastric region.  No nausea or vomiting today.  Wants to advance her diet.  Objective: Vitals:   12/09/22 1321 12/09/22 1954 12/10/22 0506 12/10/22 0730  BP: (!) 141/50 (!) 129/48 (!) 149/55   Pulse: 93 83 91   Resp: 20 18 18    Temp: 98.2 F (36.8 C) 98.1 F (36.7 C) 97.9 F (36.6 C)   TempSrc: Axillary Oral Oral   SpO2: 99% 100% 100% 99%  Weight:      Height:        Intake/Output Summary (Last 24 hours) at 12/10/2022 1122 Last data filed at 12/10/2022 1100 Gross per 24 hour  Intake 3616.7 ml  Output 500 ml  Net 3116.7 ml   Filed Weights   12/07/22 0522 12/08/22 0512 12/08/22 1139  Weight: 65.7 kg 67.4 kg 67.4 kg    Examination:  General exam: Overall comfortable, not in distress HEENT: PERRL Respiratory system: Crackles on the left base Cardiovascular system: S1 & S2 heard, RRR.  Gastrointestinal system: Abdomen is mildly distended, soft .  Bowel sounds present.  Minimally tender in the epigastric region Central nervous system: Alert and oriented Extremities: bilateral lower extremity pitting edema, no clubbing ,no cyanosis Skin: No rashes, no ulcers,no icterus     Data Reviewed: I have personally reviewed following labs and imaging studies  CBC: Recent Labs  Lab 12/05/22 1439 12/06/22 0622 12/08/22 1530 12/09/22 0556 12/10/22 0539  WBC 20.3* 17.5* 25.4* 30.1* 34.3*  NEUTROABS 17.1* 13.6* 22.1* 26.2* 29.1*  HGB 9.3* 9.4* 10.0* 9.7* 9.8*   HCT 31.2* 32.5* 33.2* 33.7* 32.9*  MCV 82.1 84.0 80.8 84.5 82.0  PLT 738* 609* 656* 557* 531*   Basic Metabolic Panel: Recent Labs  Lab 12/05/22 1439 12/06/22 0622 12/08/22 1105 12/08/22 1530 12/10/22 0539  NA 138 139 138  --  136  K 3.4* 3.6 3.6  --  3.8  CL 101 103 103  --  105  CO2 31 29 28   --  24  GLUCOSE 125* 103* 127*  --  89  BUN 10 9 12   --  8  CREATININE 0.62 0.59 0.54  --  0.49  CALCIUM 10.4* 9.9 9.5  --  8.7*  MG  --  1.6*  --  1.5* 1.3*  PHOS  --   --   --  3.6  --      Recent Results (from the past 240 hour(s))  Urine Culture     Status: Abnormal  Collection Time: 12/05/22  2:39 PM   Specimen: Urine, Clean Catch  Result Value Ref Range Status   Specimen Description   Final    URINE, CLEAN CATCH Performed at [facility], [address] [place], [place] [address]    Special Requests   Final    NONE Performed at [facility], [address], [place], [place] [address]    Culture MULTIPLE SPECIES PRESENT, SUGGEST RECOLLECTION (A)  Final   Report Status 12/06/2022 FINAL  Final  Culture, blood (single) w Reflex to ID Panel     Status: None (Preliminary result)   Collection Time: 12/08/22  6:19 PM   Specimen: BLOOD LEFT ARM  Result Value Ref Range Status   Specimen Description   Final    BLOOD LEFT ARM Performed at [facility], [address] [place], [place] [address]    Special Requests   Final    BOTTLES DRAWN AEROBIC AND ANAEROBIC Blood Culture results may not be optimal due to an inadequate volume of blood received in culture bottles Performed at [facility], [address], [place], [place] [address]    Culture   Final    NO GROWTH 2 DAYS Performed at [facility], [address], [place], [place] [address]    Report Status PENDING  Incomplete     Radiology Studies: CT ABDOMEN PELVIS W CONTRAST  Result Date: 12/08/2022 CLINICAL DATA:  Severe acute epigastric  abdominal pain. History of lung carcinoma. EXAM: CT ABDOMEN AND PELVIS WITH CONTRAST TECHNIQUE: Multidetector CT imaging of the abdomen and pelvis was performed using the standard protocol following bolus administration of intravenous contrast. RADIATION DOSE REDUCTION: This exam was performed according to the departmental dose-optimization program which includes automated exposure control, adjustment of the mA and/or kV according to patient size and/or use of iterative reconstruction technique. CONTRAST:  OMNIPAQUE IOHEXOL 300 MG/ML  SOLN COMPARISON:  CT of the chest, abdomen and pelvis on 12/05/2022 FINDINGS: Lower chest: Stable small right pleural effusion and trace left pleural fluid. Hepatobiliary: No focal liver abnormality is seen. Status post cholecystectomy. No biliary dilatation. Pancreas: New acute pancreatitis since the prior scan with peripancreatic inflammation and edema surrounding the entire pancreas. No evidence of pancreatic necrosis, pseudocyst or pancreatic ductal dilatation. No associated pancreatic masses. Spleen: Normal in size without focal abnormality. Adrenals/Urinary Tract: Adrenal glands are unremarkable. Stable appearance of kidneys including bilateral cysts and a probable hemorrhagic cyst of the left upper pole. Bladder is unremarkable. Stomach/Bowel: No bowel obstruction or free intraperitoneal air. Potential mild ileus involving the ascending and transverse colon. Vascular/Lymphatic: Stable atherosclerosis of the abdominal aorta and iliac arteries without aneurysm. Reproductive: Status post hysterectomy. No adnexal masses. Other: New free fluid in the pelvis.  No discrete abscess. Musculoskeletal: No acute or significant osseous findings. IMPRESSION: 1. New acute pancreatitis with peripancreatic inflammation and edema surrounding the entire pancreas. No evidence of pancreatic necrosis, pseudocyst or pancreatic ductal dilatation. 2. New free fluid in the pelvis. 3. Potential mild  ileus involving the ascending and transverse colon. 4. Stable small right pleural effusion and trace left pleural fluid. 5. Stable atherosclerosis of the abdominal aorta and iliac arteries. Aortic Atherosclerosis (ICD10-I70.0). Electronically Signed   By: Irish Lack M.D.   On: 12/08/2022 18:01   DG C-Arm 1-60 Min-No Report  Result Date: 12/08/2022 Fluoroscopy was utilized by the requesting physician.  No radiographic interpretation.    Scheduled Meds:  acetaminophen  1,000 mg  Oral Q8H   ALPRAZolam  0.25 mg Oral QHS   heparin  5,000 Units Subcutaneous Q8H   lidocaine  1 patch Transdermal Q24H   metoprolol succinate  50 mg Oral Daily   mometasone-formoterol  2 puff Inhalation BID   morphine  15 mg Oral Q12H   nystatin  5 mL Mouth/Throat QID   pantoprazole  40 mg Oral Daily   polyethylene glycol  17 g Oral Daily   primidone  50 mg Oral QHS   senna  1 tablet Oral Daily   Continuous Infusions:  lactated ringers 10 mL/hr at 12/08/22 1147   lactated ringers 75 mL/hr at 12/10/22 1046   piperacillin-tazobactam (ZOSYN)  IV Stopped (12/10/22 0936)     LOS: 4 days   Burnadette Pop, MD [facility] P5/15/2024, 11:22 AM

## 2022-12-10 NOTE — Progress Notes (Signed)
START OFF PATHWAY REGIMEN - Non-Small Cell Lung   [id number]:Carboplatin AUC=2 IV D1 + Paclitaxel 45 mg/m2 IV D1 q7 Days + RT:   A cycle is every 7 days, concurrent with RT:     Paclitaxel      Carboplatin   **Always confirm dose/schedule in your pharmacy ordering system**  Patient Characteristics: Preoperative or Nonsurgical Candidate (Clinical Staging), Distant Metastasis Therapeutic Status: Preoperative or Nonsurgical Candidate (Clinical Staging) AJCC T Category: cT4 AJCC N Category: cN2 AJCC M Category: cM1 AJCC 8 Stage Grouping: IV Intent of Therapy: Non-Curative / Palliative Intent, Discussed with Patient

## 2022-12-10 NOTE — Care Management Important Message (Signed)
Important Message  Patient Details IM Letter given to the Patient. Name: Courtney Brown MRN: [id number] Date of Birth: September 05, 1940   Medicare Important Message Given:  Yes     Caren Macadam 12/10/2022, 11:32 AM

## 2022-12-10 NOTE — Progress Notes (Signed)
PT Cancellation Note  Patient Details Name: Courtney Brown MRN: [id number] DOB: Dec 07, 1940   Cancelled Treatment:     Attempted PT this am, pt had just finished mobilizing with Mobility Specialist. Therapist returned again, pt had just returned to bed with c/o fatigue. Will re-attempt next available date/time per POC.   [name] 12/10/2022, 2:14 PM

## 2022-12-10 NOTE — TOC Progression Note (Signed)
Transition of Care [facility]) - Progression Note    Patient Details  Name: Courtney Brown MRN: [id number] Date of Birth: 1940/11/23  [facility] CM/SW Contact  Beckie Busing, RN Phone Number:[phone]  12/10/2022, 2:02 PM  Clinical Narrative:    [facility] acknowledges consult for home health. CM at bedside introduces self and explains consult. Patient states that she has has HH PT before and would only want to use same agency but is unable to recall name even when provided with listing. Patient woulld like to wait until daughter gets off to discuss with her. CM has provided patient with CM number to call when choice is known. TOC will continue to follow.         Expected Discharge Plan and Services                                               Social Determinants of Health (SDOH) Interventions SDOH Screenings   Food Insecurity: No Food Insecurity (12/06/2022)  Housing: Low Risk  (12/06/2022)  Transportation Needs: No Transportation Needs (12/06/2022)  Utilities: Not At Risk (12/06/2022)  Depression (PHQ2-9): Low Risk  (12/02/2022)  Financial Resource Strain: Low Risk  (11/01/2022)  Physical Activity: Sufficiently Active (11/01/2022)  Social Connections: Unknown (11/01/2022)  Stress: No Stress Concern Present (11/01/2022)  Tobacco Use: Medium Risk (12/09/2022)    Readmission Risk Interventions     No data to display

## 2022-12-10 NOTE — Progress Notes (Signed)
Mobility Specialist - Progress Note   12/10/22 1129  Mobility  Activity Ambulated with assistance in hallway  Level of Assistance Minimal assist, patient does 75% or more  Assistive Device Front wheel walker  Distance Ambulated (ft) 50 ft  Range of Motion/Exercises Active  Activity Response Tolerated well  Mobility Referral Yes  $Mobility charge 1 Mobility  Mobility Specialist Start Time (ACUTE ONLY) 1110  Mobility Specialist Stop Time (ACUTE ONLY) 1125  Mobility Specialist Time Calculation (min) (ACUTE ONLY) 15 min   Pt received in bed and agreed to mobility. Was Min A for bed mobility. Stand by for ambulation, pt took a seated rest break and felt too fatigued to continued.  Wheeled back to room and left in recliner with all needs met. Alarm on.  Marilynne Halsted Mobility Specialist

## 2022-12-11 ENCOUNTER — Telehealth: Payer: Self-pay | Admitting: Oncology

## 2022-12-11 ENCOUNTER — Other Ambulatory Visit: Payer: Self-pay

## 2022-12-11 ENCOUNTER — Inpatient Hospital Stay (HOSPITAL_COMMUNITY): Payer: 59

## 2022-12-11 ENCOUNTER — Ambulatory Visit
Admission: RE | Admit: 2022-12-11 | Discharge: 2022-12-11 | Disposition: A | Discharge status: Home / Self Care | Payer: 59 | Source: Ambulatory Visit | Attending: Radiation Oncology | Admitting: Radiation Oncology

## 2022-12-11 DIAGNOSIS — R609 Edema, unspecified: Secondary | ICD-10-CM

## 2022-12-11 DIAGNOSIS — C3411 Malignant neoplasm of upper lobe, right bronchus or lung: Secondary | ICD-10-CM | POA: Diagnosis not present

## 2022-12-11 DIAGNOSIS — Z87891 Personal history of nicotine dependence: Secondary | ICD-10-CM | POA: Diagnosis not present

## 2022-12-11 DIAGNOSIS — C3491 Malignant neoplasm of unspecified part of right bronchus or lung: Secondary | ICD-10-CM

## 2022-12-11 DIAGNOSIS — Z510 Encounter for antineoplastic radiation therapy: Secondary | ICD-10-CM | POA: Diagnosis not present

## 2022-12-11 LAB — COMPREHENSIVE METABOLIC PANEL
ALT: 16 U/L (ref 0–44)
AST: 30 U/L (ref 15–41)
Albumin: <1.5 g/dL — ABNORMAL LOW (ref 3.5–5.0)
Alkaline Phosphatase: 98 U/L (ref 38–126)
Anion gap: 7 (ref 5–15)
BUN: 8 mg/dL (ref 8–23)
CO2: 27 mmol/L (ref 22–32)
Calcium: 8.8 mg/dL — ABNORMAL LOW (ref 8.9–10.3)
Chloride: 100 mmol/L (ref 98–111)
Creatinine, Ser: 0.53 mg/dL (ref 0.44–1.00)
GFR, Estimated: >60 mL/min (ref >60)
Glucose, Bld: 85 mg/dL (ref 70–99)
Potassium: 3.3 mmol/L — ABNORMAL LOW (ref 3.5–5.1)
Sodium: 134 mmol/L — ABNORMAL LOW (ref 135–145)
Total Bilirubin: 0.7 mg/dL (ref 0.3–1.2)
Total Protein: 4.8 g/dL — ABNORMAL LOW (ref 6.5–8.1)

## 2022-12-11 LAB — CBC
HCT: 29.8 % — ABNORMAL LOW (ref 36.0–46.0)
Hemoglobin: 8.9 g/dL — ABNORMAL LOW (ref 12.0–15.0)
MCH: 24.1 pg — ABNORMAL LOW (ref 26.0–34.0)
MCHC: 29.9 g/dL — ABNORMAL LOW (ref 30.0–36.0)
MCV: 80.5 fL (ref 80.0–100.0)
Platelets: 549 10*3/uL — ABNORMAL HIGH (ref 150–400)
RBC: 3.7 MIL/uL — ABNORMAL LOW (ref 3.87–5.11)
RDW: 17.5 % — ABNORMAL HIGH (ref 11.5–15.5)
WBC: 30.1 10*3/uL — ABNORMAL HIGH (ref 4.0–10.5)
nRBC: 0 % (ref 0.0–0.2)

## 2022-12-11 LAB — RAD ONC ARIA SESSION SUMMARY
Course Elapsed Days: 0
Plan Fractions Treated to Date: 1
Plan Prescribed Dose Per Fraction: 3 Gy
Plan Total Fractions Prescribed: 10
Plan Total Prescribed Dose: 30 Gy
Reference Point Dosage Given to Date: 3 Gy
Reference Point Session Dosage Given: 3 Gy
Session Number: 1

## 2022-12-11 LAB — CULTURE, BLOOD (SINGLE): Culture: NO GROWTH

## 2022-12-11 LAB — MAGNESIUM: Magnesium: 1.6 mg/dL — ABNORMAL LOW (ref 1.7–2.4)

## 2022-12-11 MED ORDER — BISACODYL 10 MG RE SUPP: 10.0000 mg | Freq: Once | RECTAL | Status: DC

## 2022-12-11 MED ORDER — BISACODYL 10 MG RE SUPP
10.0000 mg | Freq: Once | RECTAL | Status: AC
  Administered 2022-12-11: 10 mg via RECTAL
  Filled 2022-12-11: qty 1

## 2022-12-11 MED ORDER — POTASSIUM CHLORIDE CRYS ER 20 MEQ PO TBCR
40.0000 meq | EXTENDED_RELEASE_TABLET | Freq: Once | ORAL | Status: AC
  Administered 2022-12-11: 40 meq via ORAL
  Filled 2022-12-11: qty 2

## 2022-12-11 MED ORDER — MAGNESIUM SULFATE 2 GM/50ML IV SOLN
2.0000 g | Freq: Once | INTRAVENOUS | Status: AC
  Administered 2022-12-11: 2 g via INTRAVENOUS
  Filled 2022-12-11: qty 50

## 2022-12-11 MED ORDER — FUROSEMIDE 10 MG/ML IJ SOLN
40.0000 mg | Freq: Once | INTRAMUSCULAR | Status: AC
  Administered 2022-12-11: 40 mg via INTRAVENOUS
  Filled 2022-12-11: qty 4

## 2022-12-11 NOTE — Progress Notes (Signed)
Bilateral lower extremity venous duplex has been completed. Preliminary results can be found in CV Proc through chart review.   12/11/22 11:13 AM Olen Cordial RVT

## 2022-12-11 NOTE — Progress Notes (Signed)
Daily Progress Note   Patient Name: Courtney Brown       Date: 12/11/2022 DOB: 08-Mar-1941  Age: 82 y.o. MRN#: [id number] Attending Physician: Burnadette Pop, MD Primary Care Physician: [name], MD Admit Date: 12/05/2022 Length of Stay: 5 days  Reason for Consultation/Follow-up: Pain control  Subjective:   CC: Patient resting in bed, no Bm, opioid needs noted.      Subjective:  Reviewed EMR prior to presenting to bedside. Also discussed with RN.     Objective:   Vital Signs:  BP (!) 142/56 (BP Location: Left Arm)   Pulse 89   Temp 98.7 F (37.1 C) (Oral)   Resp 18   Ht 5' (1.524 m)   Wt 71.3 kg   SpO2 98%   BMI 30.70 kg/m   Physical Exam: General: NAD, sitting in bed  No distress   hard of hearing, elderly appearing Eyes: No drainage noted HENT: moist mucous membranes Cardiovascular: RRR Respiratory: no increased work of breathing noted, not in respiratory distress Abdomen: not distended Neuro: awake alert  Psych: appropriately answers simple questions at this time  Imaging:  I personally reviewed recent imaging.   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  Patient is scheduled to undergo radiation on 5/15.  Patient also receiving management for electrolyte imbalance.  Medicine team consulted to assist with management.   Recommendations/Plan: # Complex medical decision making/goals of care:  -Patient continuing with appropriate medical therapies to address her underlying medical conditions including  cancer directed therapies. Patient already DNR.   -  Code Status: DNR  # Symptom management:    -Pain, in setting of metastatic lung cancer to bones                               -Continue MS  Contin 15 mg every 12 hours scheduled                               - oxycodone   to 5 mg PO PRN.                                -Continue Tylenol 1000 mg scheduled every 8 hours during the day.                               -Continue lidocaine patch for right-sided ribs.                                                  -Constipation States issues of constipation at home.     Have discussed importance of bowel regimen while receiving opioids.                                 MiraLAX 17 g  to BID                               -  Continue Senna 2 tab daily.  Agree with Dulcolax rectal today.   # Psycho-social/Spiritual Support:  - Support System: daughters  # Discharge Planning: To Be Determined  -Already placed referral for follow up with PMT at [facility] on 5/12.   Discussed with: patient, RN  Thank you for allowing the palliative care team to participate in the care Alex Pflum. Mod MDM Rosalin Hawking MD Palliative Care Provider PMT # [phone]  If patient remains symptomatic despite maximum doses, please call PMT at [phone] between 0700 and 1900. Outside of these hours, please call attending, as PMT does not have night coverage.  *Please note that this is a verbal dictation therefore any spelling or grammatical errors are due to the "[employer]" system interpretation.

## 2022-12-11 NOTE — Progress Notes (Signed)
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  On presentation she was hemodynamically stable.  CT angiogram of the chest /abdomen/pelvis showed necrotic right upper lung mass with increased size from recent exam with bony destruction of the third and fourth rib on the right side, multiple pulmonary disease, metastatic lesion on the left lung.  She was admitted for intractable pain from cancer.  Hospital course also remarkable for severe epigastric pain, found to have pancreatitis. Clinically improving now.  Plan for discharge home in next 1 to 2 days if tolerates solid diet  Assessment & Plan:  Principal Problem:   Stage IV squamous cell carcinoma of right lung (HCC) Active Problems:   Acute respiratory failure with hypoxia (HCC)   Cancer related pain   COPD GOLD II   HTN (hypertension), benign   DNR (do not resuscitate)/DNI(Do Not Intubate)   Asymptomatic bacteriuria   Cancer associated pain   Malignant neoplasm of upper lobe of right lung (HCC)   High risk medication use   Palliative care encounter   Counseling and coordination of care   Medication management  Stage IV lung cancer/cancer related pain: Continue pain medications as ordered.  Currently hemodynamically stable.  On MS Contin, oxycodone, Tylenol, lidocaine patches. Follows with oncology as an outpatient.  Port-A-Cath placed on 5/13.  Plan for  continuing radiation treatment.  Acute pancreatitis: Complained of severe epigastric pain on 5/13.  CT scan showed acute pancreatitis without complication or necrosis.  Lipase minimally elevated but now has resolved.  IV fluids will be discontinued due to concern of  volume overload.  Diet advanced to soft.  She denies any nausea or abdominal pain today.Waiting for bowel movement  Leukocytosis: Unclear etiology but most likely secondary to malignancy.  Cultures have been negative so far.  Patient has been empirically started on Zosyn for rule out intra-abdominal infection.  Does not have fever.  Procalcitonin is nonreassuring Continue to monitor  Diastolic CHF: Has bilateral lower extremity edema.  IV fluids  discontinued.  Had some crackles on the left lung on 5/15.CXR on 5/15 showed large right upper lobe lung mass with adjacent pleural disease or effusion. Last echo showed EF of 55 to 60%, grade 1 diastolic dysfunction.Given a dose of lasix on 5/15,5/16. Will check venous Doppler of lower extremity because she has unilateral edema on the left lower extremity  Hypomagnesemia: Currently being monitored and supplemented as needed  COPD: Currently without exacerbation.  Continue as needed bronchodilators  Hypertension: On Toprol.  Monitor blood pressure  Constipation: Continue bowel regimen  Goals of care: Poor prognosis secondary to stage IV lung cancer with mets.  Palliative care following for goals of care.  CODE STATUS is DNR.Plan for outpatient follow up with palliative care         DVT prophylaxis:heparin injection 5,000 Units Start: 12/06/22 0600 SCDs Start: 12/05/22 2355     Code Status: DNR  Family Communication: Called daughter [name] on phone on 5/15,call not received  Patient status:Inpatient  Patient is from :home  Anticipated discharge WJ:XBJY  Estimated DC date:1-2 days   Consultants: General surgery  Procedures:Port a cath placement  Antimicrobials:  Anti-infectives (From admission, onward)    Start  Dose/Rate Route Frequency Ordered Stop   12/09/22 1800  piperacillin-tazobactam (ZOSYN) IVPB 3.375 g        3.375 g 12.5 mL/hr over 240 Minutes Intravenous Every 8 hours 12/09/22 1448     12/09/22 0800  meropenem  (MERREM) 1 g in sodium chloride 0.9 % 100 mL IVPB  Status:  Discontinued        1 g 200 mL/hr over 30 Minutes Intravenous Every 12 hours 12/09/22 0756 12/09/22 1448   12/08/22 1200  ceFAZolin (ANCEF) IVPB 2g/100 mL premix        2 g 200 mL/hr over 30 Minutes Intravenous On call to O.R. 12/08/22 1114 12/08/22 1233   12/05/22 2300  piperacillin-tazobactam (ZOSYN) IVPB 3.375 g  Status:  Discontinued        3.375 g 12.5 mL/hr over 240 Minutes Intravenous Every 8 hours 12/05/22 2238 12/06/22 1356   12/05/22 1700  cefTRIAXone (ROCEPHIN) 1 g in sodium chloride 0.9 % 100 mL IVPB        1 g 200 mL/hr over 30 Minutes Intravenous  Once 12/05/22 1654 12/05/22 2107       Subjective: Patient seen and examined at the bedside today.  She was found to be sitting in the chair.  She  appeared more comfortable than yesterday.  She is trying to eat soft diet.  Denies any worsening abdominal pain, nausea or vomiting today.  Unrelated Suboxone room air.  Denies any worsening cough or shortness of breath.  No bowel movement for last few days  Objective: Vitals:   12/11/22 0124 12/11/22 0500 12/11/22 0600 12/11/22 0903  BP: (!) 125/46  (!) 142/56   Pulse: 86  89   Resp: 20  18   Temp: 98.2 F (36.8 C)  98.7 F (37.1 C)   TempSrc: Oral  Oral   SpO2: 99%  99% 98%  Weight:  71.3 kg    Height:        Intake/Output Summary (Last 24 hours) at 12/11/2022 1112 Last data filed at 12/11/2022 1003 Gross per 24 hour  Intake 1065.25 ml  Output --  Net 1065.25 ml   Filed Weights   12/08/22 0512 12/08/22 1139 12/11/22 0500  Weight: 67.4 kg 67.4 kg 71.3 kg    Examination:  General exam: Overall comfortable, not in distress, deconditioned, weak HEENT: PERRL Respiratory system:  no wheezes or crackles continue sounds on bilateral bases Cardiovascular system: S1 & S2 heard, RRR.  Gastrointestinal system: Abdomen is mildly distended, soft and mostly nontender.  Bowel sounds present Central nervous system:  Alert and oriented Extremities: Trace bilateral lower extremity pitting edema more on the left, no clubbing ,no cyanosis Skin: No rashes, no ulcers,no icterus     Data Reviewed: I have personally reviewed following labs and imaging studies  CBC: Recent Labs  Lab 12/05/22 1439 12/06/22 0622 12/08/22 1530 12/09/22 0556 12/10/22 0539 12/11/22 0838  WBC 20.3* 17.5* 25.4* 30.1* 34.3* 30.1*  NEUTROABS 17.1* 13.6* 22.1* 26.2* 29.1*  --   HGB 9.3* 9.4* 10.0* 9.7* 9.8* 8.9*  HCT 31.2* 32.5* 33.2* 33.7* 32.9* 29.8*  MCV 82.1 84.0 80.8 84.5 82.0 80.5  PLT 738* 609* 656* 557* 531* 549*   Basic Metabolic Panel: Recent Labs  Lab 12/05/22 1439 12/06/22 0622 12/08/22 1105 12/08/22 1530 12/10/22 0539 12/11/22 0838  NA 138 139 138  --  136 134*  K 3.4* 3.6 3.6  --  3.8 3.3*  CL 101 103 103  --  105 100  CO2 31 29  28  --  24 27  GLUCOSE 125* 103* 127*  --  89 85  BUN 10 9 12   --  8 8  CREATININE 0.62 0.59 0.54  --  0.49 0.53  CALCIUM 10.4* 9.9 9.5  --  8.7* 8.8*  MG  --  1.6*  --  1.5* 1.3* 1.6*  PHOS  --   --   --  3.6  --   --      Recent Results (from the past 240 hour(s))  Urine Culture     Status: Abnormal   Collection Time: 12/05/22  2:39 PM   Specimen: Urine, Clean Catch  Result Value Ref Range Status   Specimen Description   Final    URINE, CLEAN CATCH Performed at [facility], [address] [place], [place] [address]    Special Requests   Final    NONE Performed at [facility], [address], [place], [place] [address]    Culture MULTIPLE SPECIES PRESENT, SUGGEST RECOLLECTION (A)  Final   Report Status 12/06/2022 FINAL  Final  Culture, blood (single) w Reflex to ID Panel     Status: None (Preliminary result)   Collection Time: 12/08/22  6:19 PM   Specimen: BLOOD LEFT ARM  Result Value Ref Range Status   Specimen Description   Final    BLOOD LEFT ARM Performed at [facility], [address], [place], [place]  [address]    Special Requests   Final    BOTTLES DRAWN AEROBIC AND ANAEROBIC Blood Culture results may not be optimal due to an inadequate volume of blood received in culture bottles Performed at [facility], [address] [place], [place] [address]    Culture   Final    NO GROWTH 3 DAYS Performed at [facility], [address], [place], [place] [address]    Report Status PENDING  Incomplete     Radiology Studies: DG CHEST PORT 1 VIEW  Result Date: 12/10/2022 CLINICAL DATA:  Shortness of breath EXAM: PORTABLE CHEST 1 VIEW COMPARISON:  12/05/2022, PET CT 11/18/2022 FINDINGS: Right-sided central venous port tip over the SVC. Large right upper lobe lung mass with adjacent pleural disease or effusion. Increased small left effusion. Stable cardiomediastinal silhouette with aortic atherosclerosis. No pneumothorax IMPRESSION: Large right upper lobe lung mass with adjacent pleural disease or effusion. Increased small left pleural effusion since prior exam. Electronically Signed   By: Jasmine Pang M.D.   On: 12/10/2022 15:38    Scheduled Meds:  acetaminophen  1,000 mg Oral Q8H   ALPRAZolam  0.25 mg Oral QHS   heparin  5,000 Units Subcutaneous Q8H   lidocaine  1 patch Transdermal Q24H   metoprolol succinate  50 mg Oral Daily   mometasone-formoterol  2 puff Inhalation BID   morphine  15 mg Oral Q12H   nystatin  5 mL Mouth/Throat QID   pantoprazole  40 mg Oral Daily   polyethylene glycol  17 g Oral BID   primidone  50 mg Oral QHS   senna  2 tablet Oral Daily   Continuous Infusions:  lactated ringers 10 mL/hr at 12/08/22 1147   magnesium sulfate bolus IVPB 2 g (12/11/22 1053)   piperacillin-tazobactam (ZOSYN)  IV 12.5 mL/hr at 12/11/22 1000     LOS: 5 days   Burnadette Pop, MD [facility] P5/16/2024, 11:12 AM

## 2022-12-11 NOTE — Progress Notes (Signed)
Mobility Specialist - Progress Note   12/11/22 1514  Oxygen Therapy  SpO2 99 %  O2 Device Nasal Cannula  O2 Flow Rate (L/min) 1 L/min  Patient Activity (if Appropriate) Ambulating  Mobility  Activity Ambulated with assistance in hallway  Level of Assistance Standby assist, set-up cues, supervision of patient - no hands on  Assistive Device Front wheel walker  Distance Ambulated (ft) 20 ft  Activity Response Tolerated well  Mobility Referral Yes  $Mobility charge 1 Mobility  Mobility Specialist Start Time (ACUTE ONLY) 0255  Mobility Specialist Stop Time (ACUTE ONLY) 0313  Mobility Specialist Time Calculation (min) (ACUTE ONLY) 18 min   Pt received in bed and agreeable to mobility. Pt was MinA for bed mobility. Pt was also MinA from sit>stand. C/o back pain when returning to room. Nurse made aware. No other complaints during session. Pt to bed after session with all needs met.    Pre-mobility: 98 HR, 99% SpO2 During mobility: 95 HR, 99% SpO2 Post-mobility: 88 HR, 99% SPO2  Chief Technology Officer

## 2022-12-12 ENCOUNTER — Other Ambulatory Visit: Payer: Self-pay

## 2022-12-12 ENCOUNTER — Ambulatory Visit
Admission: RE | Admit: 2022-12-12 | Discharge: 2022-12-12 | Disposition: A | Discharge status: Home / Self Care | Payer: 59 | Source: Ambulatory Visit | Attending: Radiation Oncology | Admitting: Radiation Oncology

## 2022-12-12 ENCOUNTER — Telehealth: Payer: Self-pay | Admitting: Oncology

## 2022-12-12 ENCOUNTER — Other Ambulatory Visit: Payer: Self-pay | Admitting: Internal Medicine

## 2022-12-12 DIAGNOSIS — J449 Chronic obstructive pulmonary disease, unspecified: Secondary | ICD-10-CM

## 2022-12-12 DIAGNOSIS — C3411 Malignant neoplasm of upper lobe, right bronchus or lung: Secondary | ICD-10-CM | POA: Diagnosis not present

## 2022-12-12 DIAGNOSIS — Z87891 Personal history of nicotine dependence: Secondary | ICD-10-CM | POA: Diagnosis not present

## 2022-12-12 DIAGNOSIS — C3491 Malignant neoplasm of unspecified part of right bronchus or lung: Secondary | ICD-10-CM | POA: Diagnosis not present

## 2022-12-12 DIAGNOSIS — Z510 Encounter for antineoplastic radiation therapy: Secondary | ICD-10-CM | POA: Diagnosis not present

## 2022-12-12 LAB — RAD ONC ARIA SESSION SUMMARY
Course Elapsed Days: 1
Plan Fractions Treated to Date: 2
Plan Prescribed Dose Per Fraction: 3 Gy
Plan Total Fractions Prescribed: 10
Plan Total Prescribed Dose: 30 Gy
Reference Point Dosage Given to Date: 6 Gy
Reference Point Session Dosage Given: 3 Gy
Session Number: 2

## 2022-12-12 LAB — MAGNESIUM: Magnesium: 1.8 mg/dL (ref 1.7–2.4)

## 2022-12-12 LAB — BASIC METABOLIC PANEL
Anion gap: 7 (ref 5–15)
BUN: 8 mg/dL (ref 8–23)
CO2: 29 mmol/L (ref 22–32)
Calcium: 8.6 mg/dL — ABNORMAL LOW (ref 8.9–10.3)
Chloride: 99 mmol/L (ref 98–111)
Creatinine, Ser: 0.62 mg/dL (ref 0.44–1.00)
GFR, Estimated: >60 mL/min (ref >60)
Glucose, Bld: 97 mg/dL (ref 70–99)
Potassium: 3.7 mmol/L (ref 3.5–5.1)
Sodium: 135 mmol/L (ref 135–145)

## 2022-12-12 LAB — CBC
HCT: 26.3 % — ABNORMAL LOW (ref 36.0–46.0)
Hemoglobin: 8.2 g/dL — ABNORMAL LOW (ref 12.0–15.0)
MCH: 25.1 pg — ABNORMAL LOW (ref 26.0–34.0)
MCHC: 31.2 g/dL (ref 30.0–36.0)
MCV: 80.4 fL (ref 80.0–100.0)
Platelets: 485 10*3/uL — ABNORMAL HIGH (ref 150–400)
RBC: 3.27 MIL/uL — ABNORMAL LOW (ref 3.87–5.11)
RDW: 17.7 % — ABNORMAL HIGH (ref 11.5–15.5)
WBC: 22.9 10*3/uL — ABNORMAL HIGH (ref 4.0–10.5)
nRBC: 0 % (ref 0.0–0.2)

## 2022-12-12 MED ORDER — DEXAMETHASONE 4 MG PO TABS
ORAL_TABLET | ORAL | 1 refills | Status: DC
Start: 2022-12-12 — End: 2023-02-03

## 2022-12-12 MED ORDER — ACETAMINOPHEN 500 MG PO TABS
1000.0000 mg | ORAL_TABLET | Freq: Two times a day (BID) | ORAL | Status: DC
  Administered 2022-12-12 – 2022-12-24 (×24): 1000 mg via ORAL
  Filled 2022-12-12 (×24): qty 2

## 2022-12-12 MED ORDER — LIDOCAINE-PRILOCAINE 2.5-2.5 % EX CREA
TOPICAL_CREAM | CUTANEOUS | 3 refills | Status: DC
Start: 2022-12-12 — End: 2022-12-24

## 2022-12-12 MED ORDER — PROCHLORPERAZINE MALEATE 10 MG PO TABS
10.0000 mg | ORAL_TABLET | Freq: Four times a day (QID) | ORAL | 1 refills | Status: DC | PRN
Start: 2022-12-12 — End: 2023-02-03

## 2022-12-12 MED ORDER — FUROSEMIDE 20 MG PO TABS
20.0000 mg | ORAL_TABLET | Freq: Every day | ORAL | Status: DC
  Administered 2022-12-12 – 2022-12-14 (×3): 20 mg via ORAL
  Filled 2022-12-12 (×3): qty 1

## 2022-12-12 MED ORDER — ONDANSETRON HCL 8 MG PO TABS
8.0000 mg | ORAL_TABLET | Freq: Three times a day (TID) | ORAL | 1 refills | Status: DC | PRN
Start: 2022-12-12 — End: 2023-02-03

## 2022-12-12 NOTE — Progress Notes (Signed)
Daily Progress Note   Patient Name: Courtney Brown       Date: 12/12/2022 DOB: 07-Mar-1941  Age: 82 y.o. MRN#: [id number] Attending Physician: Burnadette Pop, MD Primary Care Physician: [name], MD Admit Date: 12/05/2022 Length of Stay: 6 days  Reason for Consultation/Follow-up: Pain control  Subjective:   CC: Patient resting in bed intake and output noted, opioid needs noted.      Subjective:  Reviewed EMR prior to presenting to bedside. Also discussed with RN.     Objective:   Vital Signs:  BP (!) 119/51 (BP Location: Left Arm)   Pulse 95   Temp 97.6 F (36.4 C) (Oral)   Resp 18   Ht 5' (1.524 m)   Wt 71 kg   SpO2 98%   BMI 30.57 kg/m   Physical Exam: General: NAD, sitting in bed  No distress   hard of hearing, elderly appearing Eyes: No drainage noted HENT: moist mucous membranes Cardiovascular: RRR Respiratory: no increased work of breathing noted, not in respiratory distress Abdomen: not distended Neuro: awake alert  Psych: appropriately answers simple questions at this time  Imaging:  I personally reviewed recent imaging.   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  Patient is scheduled to undergo radiation on 5/15.  Patient also receiving management for electrolyte imbalance.  Medicine team consulted to assist with management.   Recommendations/Plan: # Complex medical decision making/goals of care:  -Patient continuing with appropriate medical therapies to address her underlying medical conditions including  cancer directed therapies. Patient already DNR.   -  Code Status: DNR  # Symptom management:    -Pain, in setting of metastatic lung cancer to bones                                -Continue MS Contin 15 mg every 12 hours scheduled                               - oxycodone   5 mg PO PRN.                                -Continue Tylenol 1000 mg scheduled BID.                                -Continue lidocaine patch for right-sided ribs.                                                  -Constipation States issues of constipation at home.     Have discussed importance of bowel regimen while receiving opioids.                                 MiraLAX 17 g  to BID                               -  Continue Senna 2 tab daily.  Agree with Dulcolax rectal today.   # Psycho-social/Spiritual Support:  - Support System: daughters  # Discharge Planning: To Be Determined  -Already placed referral for follow up with PMT at [facility] on 5/12.   Discussed with: patient, RN  Thank you for allowing the palliative care team to participate in the care Courtney Brown. low MDM Courtney Hawking MD Palliative Care Provider PMT # [phone]  If patient remains symptomatic despite maximum doses, please call PMT at [phone] between 0700 and 1900. Outside of these hours, please call attending, as PMT does not have night coverage.  *Please note that this is a verbal dictation therefore any spelling or grammatical errors are due to the "[employer] One" system interpretation.

## 2022-12-12 NOTE — Progress Notes (Signed)
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  On presentation she was hemodynamically stable.  CT angiogram of the chest /abdomen/pelvis showed necrotic right upper lung mass with increased size from recent exam with bony destruction of the third and fourth rib on the right side, multiple pulmonary disease, metastatic lesion on the left lung.  She was admitted for intractable pain from cancer.  Hospital course also remarkable for severe epigastric pain, found to have pancreatitis. Clinically improving now.  Plan for discharge home in next 1 to 2 days  Assessment & Plan:  Principal Problem:   Stage IV squamous cell carcinoma of right lung (HCC) Active Problems:   Acute respiratory failure with hypoxia (HCC)   Cancer related pain   COPD GOLD II   HTN (hypertension), benign   DNR (do not resuscitate)/DNI(Do Not Intubate)   Asymptomatic bacteriuria   Cancer associated pain   Malignant neoplasm of upper lobe of right lung (HCC)   High risk medication use   Palliative care encounter   Counseling and coordination of care   Medication management  Stage IV lung cancer/cancer related pain: Continue pain medications as ordered.  Currently hemodynamically stable.  On MS Contin, oxycodone, Tylenol, lidocaine patches. Follows with oncology as an outpatient.  Port-A-Cath placed on 5/13.  Plan for  continuing radiation treatment.  Acute pancreatitis: Complained of severe epigastric pain on 5/13.  CT scan showed acute pancreatitis without complication or necrosis.  Lipase minimally elevated but now has resolved.  IV fluids will be discontinued due to concern of volume overload.  Diet  advanced to soft,she is tolerating.  She denies any significant nausea or abdominal pain today.Had a small liquidy bowel movement on 5/16  Leukocytosis: Unclear etiology but most likely secondary to malignancy.  Cultures have been negative so far.  Patient has been empirically started on Zosyn for rule out intra-abdominal infection.  Does not have fever.  Procalcitonin is nonreassuring Continue to monitor, leukocytosis finally improving  Diastolic CHF: Has bilateral lower extremity edema.  IV fluids  discontinued.  Had some crackles on the left lung on 5/15.CXR on 5/15 showed large right upper lobe lung mass with adjacent pleural disease or effusion. Last echo showed EF of 55 to 60%, grade 1 diastolic dysfunction.Given a dose of lasix on 5/15,5/16. venous Doppler of lower extremity negative for dvt. Has persistent edema bilateral extremities.  Likely contributed by low albumin.  Will try compression stockings.  Continue low-dose Lasix  Hypomagnesemia: Currently being monitored and supplemented as needed  COPD: Currently without exacerbation.  Continue as needed bronchodilators. She is 1L  of oxygen per minute.  She may need oxygen on discharge to home  Hypertension: On Toprol.  Monitor blood pressure  Goals of care: Poor prognosis secondary to stage IV lung cancer with mets.  Palliative care following for goals of care.  CODE STATUS is DNR.Plan for outpatient follow up with palliative care  Obesity: BMI of 30.5       DVT prophylaxis:heparin injection 5,000 Units Start: 12/06/22 0600 SCDs Start: 12/05/22 2355     Code Status: DNR  Family Communication: Called daughter [name] on phone on 5/17  Patient status:Inpatient  Patient is from :home  Anticipated discharge WJ:XBJY  Estimated DC date:1-2 days   Consultants: General surgery  Procedures:Port a cath placement  Antimicrobials:  Anti-infectives (From admission, onward)    Start     Dose/Rate Route Frequency Ordered Stop    12/09/22 1800  piperacillin-tazobactam (ZOSYN) IVPB 3.375 g        3.375 g 12.5 mL/hr over 240 Minutes Intravenous Every 8 hours 12/09/22 1448     12/09/22 0800  meropenem (MERREM) 1 g in sodium chloride 0.9 % 100 mL IVPB  Status:  Discontinued        1 g 200 mL/hr over 30 Minutes Intravenous Every 12 hours 12/09/22 0756 12/09/22 1448   12/08/22 1200  ceFAZolin (ANCEF) IVPB 2g/100 mL premix        2 g 200 mL/hr over 30 Minutes Intravenous On call to O.R. 12/08/22 1114 12/08/22 1233   12/05/22 2300  piperacillin-tazobactam (ZOSYN) IVPB 3.375 g  Status:  Discontinued        3.375 g 12.5 mL/hr over 240 Minutes Intravenous Every 8 hours 12/05/22 2238 12/06/22 1356   12/05/22 1700  cefTRIAXone (ROCEPHIN) 1 g in sodium chloride 0.9 % 100 mL IVPB        1 g 200 mL/hr over 30 Minutes Intravenous  Once 12/05/22 1654 12/05/22 2107       Subjective: Patient seen and examined at the bedside today.  She was found to be sitting in the chair.  She  appeared more comfortable than yesterday.  She is trying to eat soft diet.  Denies any worsening abdominal pain, nausea or vomiting today.  Unrelated Suboxone room air.  Denies any worsening cough or shortness of breath.  No bowel movement for last few days  Objective: Vitals:   12/11/22 0124 12/11/22 0500 12/11/22 0600 12/11/22 0903  BP: (!) 125/46  (!) 142/56   Pulse: 86  89   Resp: 20  18   Temp: 98.2 F (36.8 C)  98.7 F (37.1 C)   TempSrc: Oral  Oral   SpO2: 99%  99% 98%  Weight:  71.3 kg    Height:        Intake/Output Summary (Last 24 hours) at 12/11/2022 1112 Last data filed at 12/11/2022 1003 Gross per 24 hour  Intake 1065.25 ml  Output --  Net 1065.25 ml   Filed Weights   12/08/22 0512 12/08/22 1139 12/11/22 0500  Weight: 67.4 kg 67.4 kg 71.3 kg    Examination:  General exam: Overall comfortable, not in distress,deconditioned ,weak,obese HEENT: PERRL Respiratory system:  crackles on the left base Cardiovascular system: S1  & S2 heard, RRR.  Gastrointestinal system: Abdomen is mildly distended, soft and nontender. Central nervous system: Alert and oriented Extremities: Bilateral lower extremity pitting edema, no clubbing ,no cyanosis Skin: No rashes, no ulcers,no icterus     Data Reviewed: I have personally reviewed following labs and imaging studies  CBC: Recent Labs  Lab 12/05/22 1439 12/06/22 0622 12/08/22 1530 12/09/22 0556 12/10/22 0539 12/11/22 0838  WBC 20.3* 17.5* 25.4* 30.1* 34.3* 30.1*  NEUTROABS 17.1* 13.6* 22.1* 26.2* 29.1*  --   HGB 9.3* 9.4* 10.0* 9.7* 9.8* 8.9*  HCT 31.2* 32.5* 33.2* 33.7* 32.9* 29.8*  MCV 82.1 84.0 80.8 84.5 82.0 80.5  PLT 738* 609* 656* 557* 531* 549*   Basic Metabolic Panel: Recent Labs  Lab 12/05/22 1439 12/06/22 0622 12/08/22 1105 12/08/22 1530 12/10/22 0539 12/11/22 0838  NA 138 139 138  --  136 134*  K 3.4* 3.6 3.6  --  3.8 3.3*  CL 101 103 103  --  105 100  CO2 31 29 28   --  24 27  GLUCOSE 125* 103* 127*  --  89 85  BUN 10 9 12   --  8 8  CREATININE 0.62 0.59 0.54  --  0.49 0.53  CALCIUM 10.4* 9.9 9.5  --  8.7* 8.8*  MG  --  1.6*  --  1.5* 1.3* 1.6*  PHOS  --   --   --  3.6  --   --      Recent Results (from the past 240 hour(s))  Urine Culture     Status: Abnormal   Collection Time: 12/05/22  2:39 PM   Specimen: Urine, Clean Catch  Result Value Ref Range Status   Specimen Description   Final    URINE, CLEAN CATCH Performed at [facility], [address] [place], [place] [address]    Special Requests   Final    NONE Performed at [facility], [address], [place], [place] [address]    Culture MULTIPLE SPECIES PRESENT, SUGGEST RECOLLECTION (A)  Final   Report Status 12/06/2022 FINAL  Final  Culture, blood (single) w Reflex to ID Panel     Status: None (Preliminary result)   Collection Time: 12/08/22  6:19 PM   Specimen: BLOOD LEFT ARM  Result Value Ref Range Status   Specimen Description    Final    BLOOD LEFT ARM Performed at [facility], [address], [place], [place] [address]    Special Requests   Final    BOTTLES DRAWN AEROBIC AND ANAEROBIC Blood Culture results may not be optimal due to an inadequate volume of blood received in culture bottles Performed at [facility], [address], [place], [place] [address]    Culture   Final    NO GROWTH 3 DAYS Performed at [facility], [address], [place], [place] [address]    Report Status PENDING  Incomplete     Radiology Studies: DG CHEST PORT 1 VIEW  Result Date: 12/10/2022 CLINICAL DATA:  Shortness of breath EXAM: PORTABLE CHEST 1 VIEW COMPARISON:  12/05/2022, PET CT 11/18/2022 FINDINGS: Right-sided central venous port tip over the SVC. Large right upper lobe lung mass with adjacent pleural disease or effusion. Increased small left effusion. Stable cardiomediastinal silhouette with aortic atherosclerosis. No pneumothorax IMPRESSION: Large right upper lobe lung mass with adjacent pleural disease or effusion. Increased small left pleural effusion since prior exam. Electronically Signed   By: Jasmine Pang M.D.   On: 12/10/2022 15:38    Scheduled Meds:  acetaminophen  1,000 mg Oral Q8H   ALPRAZolam  0.25 mg Oral QHS   heparin  5,000 Units Subcutaneous Q8H   lidocaine  1 patch Transdermal Q24H   metoprolol succinate  50 mg Oral Daily   mometasone-formoterol  2 puff Inhalation BID   morphine  15 mg Oral Q12H   nystatin  5 mL Mouth/Throat QID   pantoprazole  40 mg Oral Daily   polyethylene glycol  17 g Oral BID   primidone  50 mg Oral QHS   senna  2 tablet Oral Daily   Continuous Infusions:  lactated ringers 10 mL/hr at 12/08/22 1147   magnesium sulfate bolus IVPB 2 g (12/11/22 1053)   piperacillin-tazobactam (ZOSYN)  IV 12.5 mL/hr at 12/11/22 1000     LOS: 5 days   Burnadette Pop, MD [facility] P5/16/2024, 11:12 AM

## 2022-12-12 NOTE — Progress Notes (Signed)
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 PMH: arthritis, melanoma, COPD, emphysema, MI, OP, PNA, TIA    PT Comments    Pt AxO x 3 very pleasant and willing.  In bed on 2 lts nasal at 94%.  Trial RA.  Assisted OOB required increased time and rest breaks between each activity.  General transfer comment: min/guard for safety, cues for hand placement to self assist   Assisted to [facility] for loose BM urgency.  Assisted with peri care. General Gait Details: min/guard for safety, cues for hand placement to self assist Limited distance of 16 feet. Trial amb on RA sats decreased to 86% and present with audible "crackles" with 2/4 dyspnea.   Recliner following for safety. Returned to room in recliner and reapplied 2 lts oxygen. Pt plans to D/C to home with daughter.  SATURATION QUALIFICATIONS: (This note is used to comply with regulatory documentation for home oxygen)  Patient Saturations on Room Air at Rest = 90%  Patient Saturations on Room Air while Ambulating 16 feet = 86%  Patient Saturations on 2 Liters of oxygen while Ambulating = 90%  Please briefly explain why patient needs home oxygen:  Pt requires supplemental oxygen to achieve therapeutic levels    Recommendations for follow up therapy are one component of a multi-disciplinary discharge planning process, led by the attending physician.  Recommendations may be updated based on patient status, additional functional criteria and insurance authorization.  Follow Up Recommendations       Assistance Recommended at Discharge PRN  Patient can return home with the following Help with stairs or ramp for entrance;Assistance with  cooking/housework;Assist for transportation   Equipment Recommendations  Other (comment) (oxygen)    Recommendations for Other Services       Precautions / Restrictions Precautions Precautions: Fall Precaution Comments: monitor sats Restrictions Weight Bearing Restrictions: No     Mobility  Bed Mobility Overal bed mobility: Needs Assistance Bed Mobility: Supine to Sit     Supine to sit: Supervision, Min guard     General bed mobility comments: increased time and assist to rise    Transfers Overall transfer level: Needs assistance Equipment used: Rolling walker (2 wheels) Transfers: Sit to/from Stand Sit to Stand: Supervision, Min guard           General transfer comment: min/guard for safety, cues for hand placement to self assist   Assisted to [facility] for loose BM urgency.  Assisted with peri care.    Ambulation/Gait Ambulation/Gait assistance: Min guard, Min assist Gait Distance (Feet): 16 Feet Assistive device: Rolling walker (2 wheels) Gait Pattern/deviations: Step-through pattern, Decreased stride length, Trunk flexed Gait velocity: decr     General Gait Details: min/guard for safety, cues for hand placement to self assist Limited distance of 16 feet. Trial amb on RA sats decreased to 86% and present with audible "crackles" with 2/4 dyspnea.   Recliner following for safety.   Stairs             Wheelchair Mobility    Modified Rankin (Stroke Patients Only)       Balance  Cognition Arousal/Alertness: Awake/alert Behavior During Therapy: WFL for tasks assessed/performed Overall Cognitive Status: Within Functional Limits for tasks assessed                                 General Comments: Orientedx4, able to follow commands consistently pleasant        Exercises      General Comments        Pertinent Vitals/Pain Pain Assessment Pain Assessment: No/denies  pain    Home Living                          Prior Function            PT Goals (current goals can now be found in the care plan section) Progress towards PT goals: Progressing toward goals    Frequency    Min 1X/week      PT Plan Current plan remains appropriate    Co-evaluation              AM-PAC PT "6 Clicks" Mobility   Outcome Measure  Help needed turning from your back to your side while in a flat bed without using bedrails?: A Little Help needed moving from lying on your back to sitting on the side of a flat bed without using bedrails?: A Little Help needed moving to and from a bed to a chair (including a wheelchair)?: A Little Help needed standing up from a chair using your arms (e.g., wheelchair or bedside chair)?: A Little Help needed to walk in hospital room?: A Little Help needed climbing 3-5 steps with a railing? : A Lot 6 Click Score: 17    End of Session Equipment Utilized During Treatment: Gait belt Activity Tolerance: Patient tolerated treatment well Patient left: in chair;with call bell/phone within reach;with chair alarm set Nurse Communication: Mobility status PT Visit Diagnosis: Difficulty in walking, not elsewhere classified (R26.2)     Time: 1543-1610 PT Time Calculation (min) (ACUTE ONLY): 27 min  Charges:  $Gait Training: 8-22 mins $Therapeutic Activity: 8-22 mins                     Felecia Shelling  PTA Acute  Rehabilitation Services Office M-F          [phone]

## 2022-12-12 NOTE — Progress Notes (Signed)
SATURATION QUALIFICATIONS: (This note is used to comply with regulatory documentation for home oxygen)  Patient Saturations on Room Air at Rest = 90%  Patient Saturations on Room Air while Ambulating = 86%  Patient Saturations on 2 Liters of oxygen while Ambulating = 92%  Please briefly explain why patient needs home oxygen: Patient's oxygen level drops too low and requires oxygen to safely ambulate.

## 2022-12-13 DIAGNOSIS — C3491 Malignant neoplasm of unspecified part of right bronchus or lung: Secondary | ICD-10-CM | POA: Diagnosis not present

## 2022-12-13 LAB — BASIC METABOLIC PANEL
Anion gap: 4 — ABNORMAL LOW (ref 5–15)
BUN: 9 mg/dL (ref 8–23)
CO2: 31 mmol/L (ref 22–32)
Calcium: 8.9 mg/dL (ref 8.9–10.3)
Chloride: 99 mmol/L (ref 98–111)
Creatinine, Ser: 0.58 mg/dL (ref 0.44–1.00)
GFR, Estimated: >60 mL/min (ref >60)
Glucose, Bld: 91 mg/dL (ref 70–99)
Potassium: 3.9 mmol/L (ref 3.5–5.1)
Sodium: 134 mmol/L — ABNORMAL LOW (ref 135–145)

## 2022-12-13 LAB — CBC
HCT: 27.4 % — ABNORMAL LOW (ref 36.0–46.0)
Hemoglobin: 8.3 g/dL — ABNORMAL LOW (ref 12.0–15.0)
MCH: 24.3 pg — ABNORMAL LOW (ref 26.0–34.0)
MCHC: 30.3 g/dL (ref 30.0–36.0)
MCV: 80.1 fL (ref 80.0–100.0)
Platelets: 491 10*3/uL — ABNORMAL HIGH (ref 150–400)
RBC: 3.42 MIL/uL — ABNORMAL LOW (ref 3.87–5.11)
RDW: 17.7 % — ABNORMAL HIGH (ref 11.5–15.5)
WBC: 20.2 10*3/uL — ABNORMAL HIGH (ref 4.0–10.5)
nRBC: 0 % (ref 0.0–0.2)

## 2022-12-13 LAB — CULTURE, BLOOD (SINGLE)

## 2022-12-13 MED ORDER — SODIUM CHLORIDE 0.9% FLUSH: 10.0000 mL | INTRAVENOUS | Status: DC | PRN

## 2022-12-13 MED ORDER — CHLORHEXIDINE GLUCONATE CLOTH 2 % EX PADS
6.0000 | MEDICATED_PAD | Freq: Every day | CUTANEOUS | Status: DC
  Administered 2022-12-13 – 2022-12-24 (×12): 6 via TOPICAL

## 2022-12-13 MED ORDER — SODIUM CHLORIDE 0.9% FLUSH
10.0000 mL | Freq: Two times a day (BID) | INTRAVENOUS | Status: DC
  Administered 2022-12-14 – 2022-12-24 (×20): 10 mL

## 2022-12-13 NOTE — Progress Notes (Signed)
Daily Progress Note   Patient Name: Courtney Brown       Date: 12/13/2022 DOB: February 07, 1941  Age: 82 y.o. MRN#: [id number] Attending Physician: Burnadette Pop, MD Primary Care Physician: [name], MD Admit Date: 12/05/2022 Length of Stay: 7 days  Reason for Consultation/Follow-up: Pain control  Subjective:   CC: Patient resting in bed intake and output noted, opioid needs noted.     Agree with PT eval, ongoing generalized weakness.  Subjective:  Reviewed EMR prior to presenting to bedside. Also discussed with RN.     Objective:   Vital Signs:  BP (!) 111/44 (BP Location: Left Arm)   Pulse 96   Temp 98.5 F (36.9 C) (Oral)   Resp 14   Ht 5' (1.524 m)   Wt 74.5 kg   SpO2 97%   BMI 32.08 kg/m   Physical Exam: General: NAD, sitting in bed  No distress   hard of hearing, elderly appearing Eyes: No drainage noted HENT: moist mucous membranes Cardiovascular: RRR Respiratory: no increased work of breathing noted, not in respiratory distress Abdomen: not distended Neuro: awake alert  Psych: appropriately answers simple questions at this time  Imaging:  I personally reviewed recent imaging.   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  Patient is scheduled to undergo radiation on 5/15.  Patient also receiving management for electrolyte imbalance.  Medicine team consulted to assist with management.   Recommendations/Plan: # Complex medical decision making/goals of care:  -Patient continuing with appropriate medical therapies to address her underlying medical conditions including  cancer directed therapies. Patient already DNR.   -  Code Status: DNR  # Symptom management:    -Pain, in setting of  metastatic lung cancer to bones                               -Continue MS Contin 15 mg every 12 hours scheduled                               - oxycodone   5 mg PO PRN.                                -Continue Tylenol 1000 mg scheduled BID.                                -Continue lidocaine patch for right-sided ribs.                                                  -Constipation States issues of constipation at home.     Have discussed importance of bowel regimen while receiving opioids.                                 MiraLAX 17 g  to BID                               -  Continue Senna 2 tab daily.  also has Dulcolax rectal PRN  # Psycho-social/Spiritual Support:  - Support System: daughters  # Discharge Planning: To Be Determined  -Already placed referral for follow up with PMT at [facility] on 5/12.   Discussed with: patient   Thank you for allowing the palliative care team to participate in the care [facility]. low MDM Rosalin Hawking MD Palliative Care Provider PMT # [phone]  If patient remains symptomatic despite maximum doses, please call PMT at [phone] between 0700 and 1900. Outside of these hours, please call attending, as PMT does not have night coverage.  *Please note that this is a verbal dictation therefore any spelling or grammatical errors are due to the "[employer]agon [employer] One" system interpretation.

## 2022-12-13 NOTE — Progress Notes (Signed)
PROGRESS NOTE  Courtney Brown  ZOX:[id number] DOB: 01-22-1941 DOA: 12/05/2022 PCP: [name], MD   Brief Narrative: Patient is 82 year old female with history of COPD, hypertension, recently diagnosed stage IV squamous cell lung cancer with metastasis to the ribs presented to the emergency department  with complaint of worsening pain and shortness of breath.  On presentation she was hemodynamically stable.  CT angiogram of the chest /abdomen/pelvis showed necrotic right upper lung mass with increased size from recent exam with bony destruction of the third and fourth rib on the right side, multiple pulmonary disease, metastatic lesion on the left lung.  She was admitted for intractable pain from cancer.  Hospital course also remarkable for severe epigastric pain, found to have pancreatitis. Clinically improving now.  Plan for discharge home in next 1 to 2 days if remains stable.She remains very weak,requested PT evaluation  Assessment & Plan:  Principal Problem:   Stage IV squamous cell carcinoma of right lung (HCC) Active Problems:   Acute respiratory failure with hypoxia (HCC)   Cancer related pain   COPD GOLD II   HTN (hypertension), benign   DNR (do not resuscitate)/DNI(Do Not Intubate)   Asymptomatic bacteriuria   Cancer associated pain   Malignant neoplasm of upper lobe of right lung (HCC)   High risk medication use   Palliative care encounter   Counseling and coordination of care   Medication management  Stage IV lung cancer/cancer related pain: Continue pain medications as ordered.  Currently hemodynamically stable.  On MS Contin, oxycodone, Tylenol, lidocaine patches. Follows with oncology as an outpatient.  Port-A-Cath placed on 5/13.  S/p  radiation treatment.  Acute pancreatitis: Complained of severe epigastric pain on 5/13.  CT scan showed acute pancreatitis without complication or necrosis.  Lipase minimally elevated but now has resolved.  IV fluids will be  discontinued due to concern of volume overload.  Diet advanced to soft,she is tolerating.  She denies any significant nausea or abdominal pain today.  Leukocytosis: Unclear etiology but most likely secondary to malignancy.  Cultures have been negative so far.  Patient has been empirically started on Zosyn for rule out intra-abdominal infection.  Does not have fever.  Procalcitonin is nonreassuring Continue to monitor, leukocytosis finally improving  Diastolic CHF: Has bilateral lower extremity edema.  IV fluids  discontinued.  Had some crackles on the left lung on 5/15.CXR on 5/15 showed large right upper lobe lung mass with adjacent pleural disease or effusion. Last echo showed EF of 55 to 60%, grade 1 diastolic dysfunction.Given a dose of lasix on 5/15,5/16. venous Doppler of lower extremity negative for dvt. Has persistent edema bilateral extremities.  Likely contributed by low albumin.  Will try compression stockings.  Continue low-dose Lasix  Hypomagnesemia: Currently being monitored and supplemented as needed  COPD: Currently without exacerbation.  Continue as needed bronchodilators.She qualified for home oxygen for 2 L  Hypertension: On Toprol.  Monitor blood pressure  Goals of care: Poor prognosis secondary to stage IV lung cancer with mets.  Palliative care following for goals of care.  CODE STATUS is DNR.Plan for outpatient follow up with palliative care  Obesity: BMI of 30.5       DVT prophylaxis:heparin injection 5,000 Units Start: 12/06/22 0600 SCDs Start: 12/05/22 2355     Code Status: DNR  Family Communication: Called daughter [name] on phone on 5/17.called again today,call not received  Patient status:Inpatient  Patient is from :home  Anticipated discharge WJ:XBJY  Estimated DC date:1-2 days  Consultants: General surgery,palliative care,oncology  Procedures:Port a cath placement  Antimicrobials:  Anti-infectives (From admission, onward)    Start      Dose/Rate Route Frequency Ordered Stop   12/09/22 1800  piperacillin-tazobactam (ZOSYN) IVPB 3.375 g        3.375 g 12.5 mL/hr over 240 Minutes Intravenous Every 8 hours 12/09/22 1448     12/09/22 0800  meropenem (MERREM) 1 g in sodium chloride 0.9 % 100 mL IVPB  Status:  Discontinued        1 g 200 mL/hr over 30 Minutes Intravenous Every 12 hours 12/09/22 0756 12/09/22 1448   12/08/22 1200  ceFAZolin (ANCEF) IVPB 2g/100 mL premix        2 g 200 mL/hr over 30 Minutes Intravenous On call to O.R. 12/08/22 1114 12/08/22 1233   12/05/22 2300  piperacillin-tazobactam (ZOSYN) IVPB 3.375 g  Status:  Discontinued        3.375 g 12.5 mL/hr over 240 Minutes Intravenous Every 8 hours 12/05/22 2238 12/06/22 1356   12/05/22 1700  cefTRIAXone (ROCEPHIN) 1 g in sodium chloride 0.9 % 100 mL IVPB        1 g 200 mL/hr over 30 Minutes Intravenous  Once 12/05/22 1654 12/05/22 2107       Subjective: Patient seen and examined at bedside today.  Hemodynamically stable.  Overall comfortable, not in distress but looks very weak.  She denies abdomen pain and is tolerating soft diet.  Objective: Vitals:   12/12/22 1329 12/12/22 1955 12/13/22 0500 12/13/22 0608  BP: (!) 119/51   (!) 144/56  Pulse: 95   (!) 106  Resp:    14  Temp: 97.6 F (36.4 C)   97.9 F (36.6 C)  TempSrc: Oral   Oral  SpO2: 98% 99%  94%  Weight:   74.5 kg   Height:        Intake/Output Summary (Last 24 hours) at 12/13/2022 1119 Last data filed at 12/13/2022 0747 Gross per 24 hour  Intake 290.29 ml  Output --  Net 290.29 ml   Filed Weights   12/11/22 0500 12/12/22 0500 12/13/22 0500  Weight: 71.3 kg 71 kg 74.5 kg    Examination:   General exam: Weak, deconditioned HEENT: PERRL Respiratory system: Diminished sounds on the left side with some crackles Cardiovascular system: S1 & S2 heard, RRR. Chemo port Gastrointestinal system: Abdomen is nondistended, soft and nontender. Central nervous system: Alert and  oriented Extremities: Bilateral lower extremity pitting edema, no clubbing ,no cyanosis Skin: No rashes, no ulcers,no icterus     Data Reviewed: I have personally reviewed following labs and imaging studies  CBC: Recent Labs  Lab 12/08/22 1530 12/09/22 0556 12/10/22 0539 12/11/22 0838 12/12/22 0444 12/13/22 0500  WBC 25.4* 30.1* 34.3* 30.1* 22.9* 20.2*  NEUTROABS 22.1* 26.2* 29.1*  --   --   --   HGB 10.0* 9.7* 9.8* 8.9* 8.2* 8.3*  HCT 33.2* 33.7* 32.9* 29.8* 26.3* 27.4*  MCV 80.8 84.5 82.0 80.5 80.4 80.1  PLT 656* 557* 531* 549* 485* 491*   Basic Metabolic Panel: Recent Labs  Lab 12/08/22 1105 12/08/22 1530 12/10/22 0539 12/11/22 0838 12/12/22 0444 12/13/22 0500  NA 138  --  136 134* 135 134*  K 3.6  --  3.8 3.3* 3.7 3.9  CL 103  --  105 100 99 99  CO2 28  --  24 27 29 31   GLUCOSE 127*  --  89 85 97 91  BUN 12  --  8 8 8 9   CREATININE 0.54  --  0.49 0.53 0.62 0.58  CALCIUM 9.5  --  8.7* 8.8* 8.6* 8.9  MG  --  1.5* 1.3* 1.6* 1.8  --   PHOS  --  3.6  --   --   --   --      Recent Results (from the past 240 hour(s))  Urine Culture     Status: Abnormal   Collection Time: 12/05/22  2:39 PM   Specimen: Urine, Clean Catch  Result Value Ref Range Status   Specimen Description   Final    URINE, CLEAN CATCH Performed at [facility], [address], [place], [place] [address]    Special Requests   Final    NONE Performed at [facility], [address] [place], [place] [address]    Culture MULTIPLE SPECIES PRESENT, SUGGEST RECOLLECTION (A)  Final   Report Status 12/06/2022 FINAL  Final  Culture, blood (single) w Reflex to ID Panel     Status: None   Collection Time: 12/08/22  6:19 PM   Specimen: BLOOD LEFT ARM  Result Value Ref Range Status   Specimen Description   Final    BLOOD LEFT ARM Performed at [facility], [address] [place], [place] [address]    Special Requests   Final    BOTTLES DRAWN AEROBIC AND  ANAEROBIC Blood Culture results may not be optimal due to an inadequate volume of blood received in culture bottles Performed at [facility], [address], [place], [place] [address]    Culture   Final    NO GROWTH 5 DAYS Performed at [facility], [address], [place], [place] [address]    Report Status 12/13/2022 FINAL  Final     Radiology Studies: No results found.  Scheduled Meds:  acetaminophen  1,000 mg Oral Q12H   ALPRAZolam  0.25 mg Oral QHS   Chlorhexidine Gluconate Cloth  6 each Topical Daily   furosemide  20 mg Oral Daily   heparin  5,000 Units Subcutaneous Q8H   lidocaine  1 patch Transdermal Q24H   metoprolol succinate  50 mg Oral Daily   mometasone-formoterol  2 puff Inhalation BID   morphine  15 mg Oral Q12H   nystatin  5 mL Mouth/Throat QID   pantoprazole  40 mg Oral Daily   polyethylene glycol  17 g Oral BID   primidone  50 mg Oral QHS   senna  2 tablet Oral Daily   sodium chloride flush  10-40 mL Intracatheter Q12H   Continuous Infusions:  lactated ringers 10 mL/hr at 12/08/22 1147   piperacillin-tazobactam (ZOSYN)  IV 12.5 mL/hr at 12/13/22 0747     LOS: 7 days   Burnadette Pop, MD [facility] P5/18/2024, 11:19 AM

## 2022-12-14 DIAGNOSIS — C3491 Malignant neoplasm of unspecified part of right bronchus or lung: Secondary | ICD-10-CM | POA: Diagnosis not present

## 2022-12-14 LAB — CBC
HCT: 26.9 % — ABNORMAL LOW (ref 36.0–46.0)
Hemoglobin: 8.1 g/dL — ABNORMAL LOW (ref 12.0–15.0)
MCH: 24.3 pg — ABNORMAL LOW (ref 26.0–34.0)
MCHC: 30.1 g/dL (ref 30.0–36.0)
MCV: 80.8 fL (ref 80.0–100.0)
Platelets: 511 10*3/uL — ABNORMAL HIGH (ref 150–400)
RBC: 3.33 MIL/uL — ABNORMAL LOW (ref 3.87–5.11)
RDW: 18.1 % — ABNORMAL HIGH (ref 11.5–15.5)
WBC: 22.9 10*3/uL — ABNORMAL HIGH (ref 4.0–10.5)
nRBC: 0 % (ref 0.0–0.2)

## 2022-12-14 MED ORDER — FUROSEMIDE 20 MG PO TABS
20.0000 mg | ORAL_TABLET | Freq: Once | ORAL | Status: AC
  Administered 2022-12-14: 20 mg via ORAL
  Filled 2022-12-14: qty 1

## 2022-12-14 MED ORDER — FUROSEMIDE 40 MG PO TABS
40.0000 mg | ORAL_TABLET | Freq: Every day | ORAL | Status: DC
  Administered 2022-12-15 – 2022-12-23 (×9): 40 mg via ORAL
  Filled 2022-12-14 (×9): qty 1

## 2022-12-14 NOTE — TOC Progression Note (Addendum)
Transition of Care [facility]) - Progression Note    Patient Details  Name: Courtney Brown MRN: [id number] Date of Birth: Aug 28, 1940  [facility]) CM/SW Contact  Howell Rucks, RN Phone Number: 12/14/2022, 1:08 PM  Clinical Narrative: PT recommendation changed to short term rehab-SNF, MD states in team chat "  she is stable for dc whenever possible ".Will need PASRR. NCM to follow up.           Expected Discharge Plan and Services                                               Social Determinants of Health (SDOH) Interventions SDOH Screenings   Food Insecurity: No Food Insecurity (12/06/2022)  Housing: Low Risk  (12/06/2022)  Transportation Needs: No Transportation Needs (12/06/2022)  Utilities: Not At Risk (12/06/2022)  Depression (PHQ2-9): Low Risk  (12/02/2022)  Financial Resource Strain: Low Risk  (11/01/2022)  Physical Activity: Sufficiently Active (11/01/2022)  Social Connections: Unknown (11/01/2022)  Stress: No Stress Concern Present (11/01/2022)  Tobacco Use: Medium Risk (12/09/2022)    Readmission Risk Interventions     No data to display

## 2022-12-14 NOTE — Progress Notes (Signed)
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  On presentation she was hemodynamically stable.  CT angiogram of the chest /abdomen/pelvis showed necrotic right upper lung mass with increased size from recent exam with bony destruction of the third and fourth rib on the right side, multiple pulmonary disease, metastatic lesion on the left lung.  She was admitted for intractable pain from cancer.  Hospital course also remarkable for severe epigastric pain, found to have pancreatitis. She remains very weak, deconditioned .we requested PT evaluation.  Family requesting for SNF consideration  Assessment & Plan:  Principal Problem:   Stage IV squamous cell carcinoma of right lung (HCC) Active Problems:   Acute respiratory failure with hypoxia (HCC)   Cancer related pain   COPD GOLD II   HTN (hypertension), benign   DNR (do not resuscitate)/DNI(Do Not Intubate)   Asymptomatic bacteriuria   Cancer associated pain   Malignant neoplasm of upper lobe of right lung (HCC)   High risk medication use   Palliative care encounter   Counseling and coordination of care   Medication management  Stage IV lung cancer/cancer related pain: Continue pain medications as ordered.  Currently hemodynamically stable.  On MS Contin, oxycodone, Tylenol, lidocaine patches. Follows with oncology as an outpatient.  Port-A-Cath placed on 5/13.  S/p  radiation treatment. Plan for starting chemotherapy on 28th of this month.  Her performance status is very poor.  I am not sure how she can tolerate chemotherapy.  Acute pancreatitis: Complained of severe epigastric pain on 5/13.  CT scan showed acute  pancreatitis without complication or necrosis.  Lipase minimally elevated but now has resolved.  IV fluids will be discontinued due to concern of volume overload.  Diet advanced to soft,she is tolerating.  She denies any significant nausea or abdominal pain today.  Leukocytosis: Unclear etiology but most likely secondary to malignancy.  Cultures have been negative so far.  Patient has been empirically started on Zosyn for rule out intra-abdominal infection.  Does not have fever.  Procalcitonin is nonreassuring Continue to monitor, leukocytosis finally improving.  Will stop antibiotics  Diastolic CHF: Has bilateral lower extremity edema.  IV fluids  discontinued.  Had some crackles on the left lung on 5/15.CXR on 5/15 showed large right upper lobe lung mass with adjacent pleural disease or effusion. Last echo showed EF of 55 to 60%, grade 1 diastolic dysfunction.Given a dose of lasix on 5/15,5/16. venous Doppler of lower extremity negative for dvt. Has persistent edema bilateral extremities.  Likely contributed by low albumin.  She could not get compression stockings.  Continue oral Lasix  Hypomagnesemia: Currently being monitored and supplemented as needed  COPD: Currently without exacerbation.  Continue as needed bronchodilators.She qualified for home oxygen for 2 L  Hypertension: On Toprol.  Monitor blood pressure  Goals of care: Poor prognosis secondary to stage IV lung cancer with mets.  Palliative care following for goals of care.  CODE STATUS is DNR.Plan for outpatient follow up with palliative care.  Patient, family still planning to continue chemotherapy that has been planned to start on 5/28.  Her performance status is very poor.  She is very weak. PT recommending home health.  Daughter says  she is  unable to take care of her at home and requesting for SNF consideration  Obesity: BMI of 30.5       DVT prophylaxis:heparin injection 5,000 Units Start: 12/06/22 0600 SCDs Start: 12/05/22  2355     Code Status: DNR  Family Communication: Called daughter [name] on phone on 5/19  Patient status:Inpatient  Patient is from :home  Anticipated discharge NW:GNFA vs SnF  Estimated DC date:1-2 days   Consultants: General surgery,palliative care,oncology  Procedures:Port a cath placement  Antimicrobials:  Anti-infectives (From admission, onward)    Start     Dose/Rate Route Frequency Ordered Stop   12/09/22 1800  piperacillin-tazobactam (ZOSYN) IVPB 3.375 g        3.375 g 12.5 mL/hr over 240 Minutes Intravenous Every 8 hours 12/09/22 1448     12/09/22 0800  meropenem (MERREM) 1 g in sodium chloride 0.9 % 100 mL IVPB  Status:  Discontinued        1 g 200 mL/hr over 30 Minutes Intravenous Every 12 hours 12/09/22 0756 12/09/22 1448   12/08/22 1200  ceFAZolin (ANCEF) IVPB 2g/100 mL premix        2 g 200 mL/hr over 30 Minutes Intravenous On call to O.R. 12/08/22 1114 12/08/22 1233   12/05/22 2300  piperacillin-tazobactam (ZOSYN) IVPB 3.375 g  Status:  Discontinued        3.375 g 12.5 mL/hr over 240 Minutes Intravenous Every 8 hours 12/05/22 2238 12/06/22 1356   12/05/22 1700  cefTRIAXone (ROCEPHIN) 1 g in sodium chloride 0.9 % 100 mL IVPB        1 g 200 mL/hr over 30 Minutes Intravenous  Once 12/05/22 1654 12/05/22 2107       Subjective: Patient seen and examined at bedside today.  On 2 L of oxygen.  Tolerating soft diet.  Denies any severe abdominal pain, nausea or vomiting today.  Complains of severe weakness  Objective: Vitals:   12/13/22 2055 12/13/22 2107 12/14/22 0522 12/14/22 0744  BP:  (!) 119/50 (!) 117/57   Pulse:  95 84   Resp:  18 18   Temp:  98.2 F (36.8 C) 97.8 F (36.6 C)   TempSrc:  Oral Oral   SpO2: 96% 100% 98% 99%  Weight:      Height:        Intake/Output Summary (Last 24 hours) at 12/14/2022 1125 Last data filed at 12/14/2022 0402 Gross per 24 hour  Intake 118.14 ml  Output --  Net 118.14 ml   Filed Weights   12/11/22 0500  12/12/22 0500 12/13/22 0500  Weight: 71.3 kg 71 kg 74.5 kg    Examination:   General exam: Very weak, deconditioned HEENT: PERRL Respiratory system: Diminished sounds on the left side Cardiovascular system: S1 & S2 heard, RRR.  Chemo-Port Gastrointestinal system: Abdomen is mildly distended, soft and nontender.  Bowel sounds present Central nervous system: Alert and oriented Extremities: Bilateral lower EXTR pitting edema, no clubbing ,no cyanosis Skin: No rashes, no ulcers,no icterus     Data Reviewed: I have personally reviewed following labs and imaging studies  CBC: Recent Labs  Lab 12/08/22 1530 12/09/22 0556 12/10/22 0539 12/11/22 0838 12/12/22 0444 12/13/22 0500 12/14/22 0457  WBC 25.4* 30.1* 34.3* 30.1* 22.9* 20.2* 22.9*  NEUTROABS 22.1* 26.2* 29.1*  --   --   --   --   HGB 10.0* 9.7* 9.8* 8.9* 8.2* 8.3* 8.1*  HCT 33.2* 33.7* 32.9* 29.8* 26.3* 27.4* 26.9*  MCV 80.8 84.5 82.0 80.5 80.4 80.1  80.8  PLT 656* 557* 531* 549* 485* 491* 511*   Basic Metabolic Panel: Recent Labs  Lab 12/08/22 1105 12/08/22 1530 12/10/22 0539 12/11/22 0838 12/12/22 0444 12/13/22 0500  NA 138  --  136 134* 135 134*  K 3.6  --  3.8 3.3* 3.7 3.9  CL 103  --  105 100 99 99  CO2 28  --  24 27 29 31   GLUCOSE 127*  --  89 85 97 91  BUN 12  --  8 8 8 9   CREATININE 0.54  --  0.49 0.53 0.62 0.58  CALCIUM 9.5  --  8.7* 8.8* 8.6* 8.9  MG  --  1.5* 1.3* 1.6* 1.8  --   PHOS  --  3.6  --   --   --   --      Recent Results (from the past 240 hour(s))  Urine Culture     Status: Abnormal   Collection Time: 12/05/22  2:39 PM   Specimen: Urine, Clean Catch  Result Value Ref Range Status   Specimen Description   Final    URINE, CLEAN CATCH Performed at [facility], [address], [place], [place] [address]    Special Requests   Final    NONE Performed at [facility], [address], [place], [place] [address]    Culture MULTIPLE SPECIES PRESENT,  SUGGEST RECOLLECTION (A)  Final   Report Status 12/06/2022 FINAL  Final  Culture, blood (single) w Reflex to ID Panel     Status: None   Collection Time: 12/08/22  6:19 PM   Specimen: BLOOD LEFT ARM  Result Value Ref Range Status   Specimen Description   Final    BLOOD LEFT ARM Performed at [facility], [address], [place], [place] [address]    Special Requests   Final    BOTTLES DRAWN AEROBIC AND ANAEROBIC Blood Culture results may not be optimal due to an inadequate volume of blood received in culture bottles Performed at [facility], [address], [place], [place] [address]    Culture   Final    NO GROWTH 5 DAYS Performed at [facility], [address] [place], [place] [address]    Report Status 12/13/2022 FINAL  Final     Radiology Studies: No results found.  Scheduled Meds:  acetaminophen  1,000 mg Oral Q12H   ALPRAZolam  0.25 mg Oral QHS   Chlorhexidine Gluconate Cloth  6 each Topical Daily   furosemide  20 mg Oral Once   [START ON 12/15/2022] furosemide  40 mg Oral Daily   heparin  5,000 Units Subcutaneous Q8H   lidocaine  1 patch Transdermal Q24H   metoprolol succinate  50 mg Oral Daily   mometasone-formoterol  2 puff Inhalation BID   morphine  15 mg Oral Q12H   nystatin  5 mL Mouth/Throat QID   pantoprazole  40 mg Oral Daily   polyethylene glycol  17 g Oral BID   primidone  50 mg Oral QHS   senna  2 tablet Oral Daily   sodium chloride flush  10-40 mL Intracatheter Q12H   Continuous Infusions:  lactated ringers 10 mL/hr at 12/08/22 1147   piperacillin-tazobactam (ZOSYN)  IV 3.375 g (12/14/22 0612)     LOS: 8 days   Burnadette Pop, MD [facility] P5/19/2024, 11:25 AM

## 2022-12-14 NOTE — Progress Notes (Signed)
Daily Progress Note   Patient Name: Courtney Brown       Date: 12/14/2022 DOB: [birth year]  Age: 82 y.o. MRN#: [id number] Attending Physician: Burnadette Pop, MD Primary Care Physician: [name], MD Admit Date: 12/05/2022 Length of Stay: 8 days  Reason for Consultation/Follow-up: Pain control  Subjective:   CC: Patient resting in bed intake and output noted, opioid needs noted.      PT eval, ongoing generalized weakness.  Subjective:  Reviewed EMR prior to presenting to bedside. Also discussed with TRH MD.    Objective:   Vital Signs:  BP (!) 117/57 (BP Location: Left Arm)   Pulse 84   Temp 97.8 F (36.6 C) (Oral)   Resp 18   Ht 5' (1.524 m)   Wt 74.5 kg   SpO2 99%   BMI 32.08 kg/m   Physical Exam: General: NAD, sitting in bed  No distress   hard of hearing, elderly appearing Eyes: No drainage noted HENT: moist mucous membranes Cardiovascular: RRR Respiratory: no increased work of breathing noted, not in respiratory distress Abdomen: not distended Neuro: awake alert  Psych: appropriately answers simple questions at this time  Imaging:  I personally reviewed recent imaging.   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  Patient is scheduled to undergo radiation on 5/15.  Patient also receiving management for electrolyte imbalance.  Medicine team consulted to assist with management.   Recommendations/Plan: # Complex medical decision making/goals of care:  -Patient continuing with appropriate medical therapies to address her underlying medical conditions including  cancer directed therapies. Patient already DNR.  Goals of care is such that the patient and family wish to continue with radiation and  have plans for initiation of chemotherapy in the near future.  -  Code Status: DNR  # Symptom management:    -Pain, in setting of metastatic lung cancer to bones                               -Continue MS Contin 15 mg every 12 hours scheduled                               - oxycodone   5 mg PO PRN.                                -Continue Tylenol 1000 mg scheduled BID.                                -Continue lidocaine patch for right-sided ribs.                                                  -Constipation States issues of constipation at home.     Have discussed importance of bowel regimen while receiving opioids.  MiraLAX 17 g  to BID                               -Continue Senna 2 tab daily.  also has Dulcolax rectal PRN  # Psycho-social/Spiritual Support:  - Support System: daughters  # Discharge Planning:   Patient being considered for skilled nursing facility rehabilitation attempt.  Recommend palliative support in the outpatient setting.  Discussed with: patient   Thank you for allowing the palliative care team to participate in the care [facility]. low MDM Rosalin Hawking MD Palliative Care Provider PMT # [phone]  If patient remains symptomatic despite maximum doses, please call PMT at [phone] between 0700 and 1900. Outside of these hours, please call attending, as PMT does not have night coverage.  *Please note that this is a verbal dictation therefore any spelling or grammatical errors are due to the "Dragon [employer] One" system interpretation.

## 2022-12-14 NOTE — Progress Notes (Signed)
Physical Therapy Treatment Patient Details Name: Courtney Brown MRN: [id number] DOB: [birth year] Today's Date: 12/14/2022   History of Present Illness Pt is an 82 year old with history of COPD, hypertension, recently diagnosed stage IV squamous cell lung cancer with metastasis infiltration of the ribs presented to the ER with worsening pain and shortness of breath. PMH: arthritis, melanoma, COPD, emphysema, MI, OP, PNA, TIA    PT Comments    Pt requiring at least min assist today and unable to tolerate much distance with ambulation.  Pt left on [facility] with nurse tech present per request.  Pt does not feel able to function at home in current state.  Pt may benefit from post acute rehab upon d/c.     Recommendations for follow up therapy are one component of a multi-disciplinary discharge planning process, led by the attending physician.  Recommendations may be updated based on patient status, additional functional criteria and insurance authorization.  Follow Up Recommendations       Assistance Recommended at Discharge PRN  Patient can return home with the following Help with stairs or ramp for entrance;Assistance with cooking/housework;Assist for transportation;A little help with walking and/or transfers   Equipment Recommendations  None recommended by PT    Recommendations for Other Services       Precautions / Restrictions Precautions Precautions: Fall Precaution Comments: monitor sats     Mobility  Bed Mobility Overal bed mobility: Needs Assistance Bed Mobility: Supine to Sit     Supine to sit: Min assist, HOB elevated     General bed mobility comments: increased time and effort, assist for trunk    Transfers Overall transfer level: Needs assistance Equipment used: Rolling walker (2 wheels) Transfers: Sit to/from Stand Sit to Stand: Min guard           General transfer comment: cues for hand placement, assist to rise and steady     Ambulation/Gait Ambulation/Gait assistance: Min guard, Min assist Gait Distance (Feet): 8 Feet Assistive device: Rolling walker (2 wheels) Gait Pattern/deviations: Step-through pattern, Decreased stride length, Trunk flexed Gait velocity: decr     General Gait Details: pt with goal of approx 20 feet total however after 8 feet pt reporting BM urgency and took a few steps backwards into room to [facility], remained on 2L O2 [place] and SpO2 96%   Stairs             Wheelchair Mobility    Modified Rankin (Stroke Patients Only)       Balance Overall balance assessment: Needs assistance         Standing balance support: Bilateral upper extremity supported, Reliant on assistive device for balance Standing balance-Leahy Scale: Poor                              Cognition Arousal/Alertness: Awake/alert Behavior During Therapy: WFL for tasks assessed/performed Overall Cognitive Status: Within Functional Limits for tasks assessed                                          Exercises      General Comments        Pertinent Vitals/Pain Pain Assessment Pain Assessment: No/denies pain    Home Living  Prior Function            PT Goals (current goals can now be found in the care plan section) Progress towards PT goals: Progressing toward goals    Frequency    Min 1X/week      PT Plan Discharge plan needs to be updated    Co-evaluation              AM-PAC PT "6 Clicks" Mobility   Outcome Measure  Help needed turning from your back to your side while in a flat bed without using bedrails?: A Little Help needed moving from lying on your back to sitting on the side of a flat bed without using bedrails?: A Little Help needed moving to and from a bed to a chair (including a wheelchair)?: A Little Help needed standing up from a chair using your arms (e.g., wheelchair or bedside chair)?: A Little Help  needed to walk in hospital room?: A Lot Help needed climbing 3-5 steps with a railing? : A Lot 6 Click Score: 16    End of Session Equipment Utilized During Treatment: Gait belt Activity Tolerance: Patient tolerated treatment well Patient left: with nursing/sitter in room (pt requesting time on [facility], nurse tech into room with pt) Nurse Communication: Mobility status PT Visit Diagnosis: Difficulty in walking, not elsewhere classified (R26.2)     Time: 1610-9604 PT Time Calculation (min) (ACUTE ONLY): 17 min  Charges:  $Gait Training: 8-22 mins                    Paulino Door, DPT Physical Therapist Acute Rehabilitation Services Office: [phone]   Kati L Payson 12/14/2022, 1:00 PM

## 2022-12-15 ENCOUNTER — Other Ambulatory Visit: Payer: Self-pay

## 2022-12-15 ENCOUNTER — Inpatient Hospital Stay: Payer: 59 | Attending: Oncology

## 2022-12-15 ENCOUNTER — Other Ambulatory Visit: Payer: 59

## 2022-12-15 ENCOUNTER — Ambulatory Visit
Admission: RE | Admit: 2022-12-15 | Discharge: 2022-12-15 | Disposition: A | Discharge status: Home / Self Care | Payer: 59 | Source: Ambulatory Visit | Attending: Radiation Oncology | Admitting: Radiation Oncology

## 2022-12-15 DIAGNOSIS — Z510 Encounter for antineoplastic radiation therapy: Secondary | ICD-10-CM | POA: Diagnosis not present

## 2022-12-15 DIAGNOSIS — C3411 Malignant neoplasm of upper lobe, right bronchus or lung: Secondary | ICD-10-CM | POA: Diagnosis not present

## 2022-12-15 DIAGNOSIS — C3491 Malignant neoplasm of unspecified part of right bronchus or lung: Secondary | ICD-10-CM | POA: Diagnosis not present

## 2022-12-15 DIAGNOSIS — Z87891 Personal history of nicotine dependence: Secondary | ICD-10-CM | POA: Diagnosis not present

## 2022-12-15 LAB — RAD ONC ARIA SESSION SUMMARY
Course Elapsed Days: 4
Plan Fractions Treated to Date: 3
Plan Prescribed Dose Per Fraction: 3 Gy
Plan Total Fractions Prescribed: 10
Plan Total Prescribed Dose: 30 Gy
Reference Point Dosage Given to Date: 9 Gy
Reference Point Session Dosage Given: 3 Gy
Session Number: 3

## 2022-12-15 MED ORDER — BOOST / RESOURCE BREEZE PO LIQD CUSTOM
1.0000 | Freq: Two times a day (BID) | ORAL | Status: DC
  Administered 2022-12-16 – 2022-12-24 (×14): 1 via ORAL

## 2022-12-15 MED ORDER — ADULT MULTIVITAMIN W/MINERALS CH
1.0000 | ORAL_TABLET | Freq: Every day | ORAL | Status: DC
  Administered 2022-12-15 – 2022-12-24 (×10): 1 via ORAL
  Filled 2022-12-15 (×10): qty 1

## 2022-12-15 MED ORDER — LOPERAMIDE HCL 2 MG PO CAPS: 2.0000 mg | ORAL_CAPSULE | Freq: Three times a day (TID) | ORAL | Status: DC | PRN

## 2022-12-15 MED ORDER — POTASSIUM CHLORIDE CRYS ER 20 MEQ PO TBCR
20.0000 meq | EXTENDED_RELEASE_TABLET | Freq: Every day | ORAL | Status: DC
  Administered 2022-12-15 – 2022-12-24 (×10): 20 meq via ORAL
  Filled 2022-12-15 (×10): qty 1

## 2022-12-15 NOTE — Progress Notes (Signed)
Daily Progress Note   Patient Name: Courtney Brown       Date: 12/15/2022 DOB: 09-22-1940  Age: 82 y.o. MRN#: [id number] Attending Physician: Burnadette Pop, MD Primary Care Physician: [name], MD Admit Date: 12/05/2022 Length of Stay: 9 days  Reason for Consultation/Follow-up: Pain control  Subjective:   CC: Patient resting in bed intake and output noted, opioid needs noted.      PT eval, ongoing generalized weakness.  Subjective: Awake alert, resting in bed, discussed with TOC, plan is for SNF rehab with palliative.  Reviewed EMR prior to presenting to bedside. Also discussed with TRH MD.    Objective:   Vital Signs:  BP (!) 122/46 (BP Location: Left Arm)   Pulse 84   Temp (!) 97.5 F (36.4 C) (Oral)   Resp 18   Ht 5' (1.524 m)   Wt 70.5 kg   SpO2 98%   BMI 30.35 kg/m   Physical Exam: General: NAD, sitting in bed  No distress   hard of hearing, elderly appearing Eyes: No drainage noted HENT: moist mucous membranes Cardiovascular: RRR Respiratory: no increased work of breathing noted, not in respiratory distress Abdomen: not distended Neuro: awake alert  Psych: appropriately answers simple questions at this time  Imaging:  I personally reviewed recent imaging.   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  Patient is scheduled to undergo radiation on 5/15.  Patient also receiving management for electrolyte imbalance.  Medicine team consulted to assist with management.   Recommendations/Plan: # Complex medical decision making/goals of care:  -Patient continuing with appropriate medical therapies to address her underlying medical conditions including  cancer directed therapies. Patient already  DNR.  Goals of care is such that the patient and family wish to continue with radiation and have plans for initiation of chemotherapy in the near future.  -  Code Status: DNR  # Symptom management:    -Pain, in setting of metastatic lung cancer to bones                               -Continue MS Contin 15 mg every 12 hours scheduled                               - oxycodone   5 mg PO PRN: one dose only in the past 24 hours.                                -Continue Tylenol 1000 mg scheduled BID.                                -Continue lidocaine patch for right-sided ribs.                                                  -Constipation States issues of constipation at home.     Have discussed  importance of bowel regimen while receiving opioids.                                 MiraLAX 17 g  to BID                               -Continue Senna 2 tab daily.  also has Dulcolax rectal PRN  # Psycho-social/Spiritual Support:  - Support System: daughters  # Discharge Planning:   Patient being considered for skilled nursing facility rehabilitation attempt.  Recommend palliative support in the outpatient setting.  Discussed with: patient   Thank you for allowing the palliative care team to participate in the care [facility]. low MDM Rosalin Hawking MD Palliative Care Provider PMT # [phone]  If patient remains symptomatic despite maximum doses, please call PMT at [phone] between 0700 and 1900. Outside of these hours, please call attending, as PMT does not have night coverage.  *Please note that this is a verbal dictation therefore any spelling or grammatical errors are due to the "Dragon Medical One" system interpretation.

## 2022-12-15 NOTE — TOC Initial Note (Signed)
Transition of Care [facility]) - Initial/Assessment Note    Patient Details  Name: Courtney Brown MRN: [id number] Date of Birth: 06-Aug-1940  Transition of [facility]) CM/SW Contact:    Elliot Gault, LCSW Phone Number: 12/15/2022, 9:31 AM  Clinical Narrative:                  Pt from home. PT recommending SNF rehab at dc. Spoke with pt and with her daughter (at pt request) to review dc planning.  Pt defers to her daughter, [name]. Provided CMS SNF options to [facility] who is agreeable to referrals. Will refer as requested. Insurance Berkley Harvey will be started.   Per MD, pt medically ready for dc as soon as SNF and auth obtained.  Will follow.  Expected Discharge Plan: Skilled Nursing Facility Barriers to Discharge: SNF Pending bed offer, Insurance Authorization   Patient Goals and CMS Choice Patient states their goals for this hospitalization and ongoing recovery are:: rehab CMS Medicare.gov Compare Post Acute Care list provided to:: Patient Choice offered to / list presented to : Patient, Adult Children      Expected Discharge Plan and Services In-house Referral: Clinical Social Work   Post Acute Care Choice: Skilled Nursing Facility Living arrangements for the past 2 months: Single Family Home                                      Prior Living Arrangements/Services Living arrangements for the past 2 months: Single Family Home Lives with:: Adult Children Patient language and need for interpreter reviewed:: Yes Do you feel safe going back to the place where you live?: Yes      Need for Family Participation in Patient Care: Yes (Comment) Care giver support system in place?: Yes (comment) Current home services: DME Criminal Activity/Legal Involvement Pertinent to Current Situation/Hospitalization: No - Comment as needed  Activities of Daily Living Home Assistive Devices/Equipment: Eyeglasses, Dentures (specify type), Shower chair with back, Walker (specify type) ADL  Screening (condition at time of admission) Patient's cognitive ability adequate to safely complete daily activities?: Yes Is the patient deaf or have difficulty hearing?: Yes Does the patient have difficulty seeing, even when wearing glasses/contacts?: No Does the patient have difficulty concentrating, remembering, or making decisions?: No Patient able to express need for assistance with ADLs?: Yes Does the patient have difficulty dressing or bathing?: No Independently performs ADLs?: Yes (appropriate for developmental age) Does the patient have difficulty walking or climbing stairs?: No Weakness of Legs: Both Weakness of Arms/Hands: None  Permission Sought/Granted Permission sought to share information with : Oceanographer granted to share information with : Yes, Verbal Permission Granted     Permission granted to share info w AGENCY: snfs        Emotional Assessment Appearance:: Appears stated age Attitude/Demeanor/Rapport: Engaged Affect (typically observed): Pleasant Orientation: : Oriented to Place, Oriented to Self, Oriented to  Time, Oriented to Situation Alcohol / Substance Use: Not Applicable Psych Involvement: No (comment)  Admission diagnosis:  Hypoxia [R09.02] Malignant neoplasm of upper lobe of right lung (HCC) [C34.11] Stage IV squamous cell carcinoma of right lung (HCC) [C34.91] Cancer associated pain [G89.3] Patient Active Problem List   Diagnosis Date Noted   Counseling and coordination of care 12/07/2022   Medication management 12/07/2022   Cancer associated pain 12/06/2022   Malignant neoplasm of upper lobe of right lung (HCC) 12/06/2022   High risk medication  use 12/06/2022   Palliative care encounter 12/06/2022   Stage IV squamous cell carcinoma of right lung (HCC) 12/05/2022   DNR (do not resuscitate)/DNI(Do Not Intubate) 12/05/2022   Asymptomatic bacteriuria 12/05/2022   Squamous cell lung cancer (HCC) 11/04/2022   Cancer  related pain 11/04/2022   Hypercalcemia 08/26/2022   Degenerative arthritis of right knee 07/09/2022   Spinal stenosis of lumbar region 07/09/2022   LRTI (lower respiratory tract infection) 06/04/2022   Anemia due to zinc deficiency 03/01/2022   Deficiency anemia 02/26/2022   Iron deficiency anemia secondary to inadequate dietary iron intake 02/26/2022   Thrombocytosis 02/26/2022   Vitamin B12 deficiency anemia due to intrinsic factor deficiency 02/26/2022   Coarse tremors 02/18/2022   Hyperlipidemia LDL goal <70 08/21/2021   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/2021   Prediabetes 01/30/2021   Incisional hernia 10/26/2020   Tobacco abuse 08/19/2019   Goals of care, counseling/discussion 06/03/2018   Chronic respiratory failure with hypercapnia (HCC) 01/19/2018   Chronic bilateral low back pain with right-sided sciatica 09/01/2017   Acute respiratory failure with hypoxia (HCC)    Colon polyps 08/08/2016   PVD (peripheral vascular disease) (HCC) 08/08/2016   COPD GOLD II 07/29/2016   HTN (hypertension), benign 07/29/2016   CAD (coronary artery disease) 07/29/2016   PHN (postherpetic neuralgia) 07/29/2016   Adjustment disorder with mixed anxiety and depressed mood 07/29/2016   Osteoporosis 07/29/2016   Gastroesophageal reflux disease without esophagitis 07/29/2016   Hearing loss 07/29/2016   PCP:  [name], MD Pharmacy:   [id number] - [place], [place] - [address] [place] [address] Phone: [phone] Fax: [phone]  [facility] - [place], [place] - [phone] [place] [address] Phone: [phone] Fax: [phone]     Social Determinants of Health (SDOH) Social History: SDOH Screenings   Food Insecurity: No Food Insecurity (12/06/2022)  Housing: Low Risk  (12/06/2022)  Transportation Needs: No Transportation Needs (12/06/2022)  Utilities: Not At Risk (12/06/2022)  Depression  (PHQ2-9): Low Risk  (12/02/2022)  Financial Resource Strain: Low Risk  (11/01/2022)  Physical Activity: Sufficiently Active (11/01/2022)  Social Connections: Unknown (11/01/2022)  Stress: No Stress Concern Present (11/01/2022)  Tobacco Use: Medium Risk (12/09/2022)   SDOH Interventions:     Readmission Risk Interventions     No data to display

## 2022-12-15 NOTE — Progress Notes (Signed)
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 PMH: arthritis, melanoma, COPD, emphysema, HTN, MI, OP, PNA, TIA   OT comments  Pt making slow but steady progress. Pt overall able to mobilize during adls with min guard but fatigues very quickly. Pt unable to tolerate a lot of activity at one time which may make going straight home difficult.  Goals for energy conservation added and feel pt may benefit from less than 3 hours a day of continued rehab before returning home.  Will continue to see with focus on energy conservation and safety.   Recommendations for follow up therapy are one component of a multi-disciplinary discharge planning process, led by the attending physician.  Recommendations may be updated based on patient status, additional functional criteria and insurance authorization.    Assistance Recommended at Discharge Intermittent Supervision/Assistance  Patient can return home with the following  Assistance with cooking/housework;Assist for transportation;Help with stairs or ramp for entrance;A little help with walking and/or transfers;A little help with bathing/dressing/bathroom   Equipment Recommendations  BSC/3in1    Recommendations for Other Services      Precautions / Restrictions Precautions Precautions: Fall Precaution Comments: monitor sats Restrictions Weight Bearing Restrictions: No       Mobility Bed Mobility Overal bed mobility: Needs Assistance Bed Mobility: Supine to Sit     Supine to sit: Min assist, HOB elevated     General bed mobility comments: cues to use the bedrails instead of physical assist.    Transfers Overall transfer level: Needs assistance Equipment  used: Rolling walker (2 wheels) Transfers: Sit to/from Stand, Bed to chair/wheelchair/BSC Sit to Stand: Min guard     Step pivot transfers: Min guard     General transfer comment: cues for hand placement, assist to rise and steady     Balance Overall balance assessment: Needs assistance Sitting-balance support: Feet supported Sitting balance-Leahy Scale: Good     Standing balance support: Bilateral upper extremity supported, Reliant on assistive device for balance Standing balance-Leahy Scale: Poor Standing balance comment: Min guard with RW                           ADL either performed or assessed with clinical judgement   ADL Overall ADL's : Needs assistance/impaired Eating/Feeding: Independent;Sitting Eating/Feeding Details (indicate cue type and reason): fed self breakfast Grooming: Wash/dry hands;Wash/dry face;Oral care;Min guard;Standing Grooming Details (indicate cue type and reason): pt stood at sink for appx 2 mintues before fatiguing and returning to chair.                 Toilet Transfer: Min guard;Stand-pivot;BSC/3in1;Rolling walker (2 wheels) Toilet Transfer Details (indicate cue type and reason): pivot to [facility]. Cues to reach back to [facility] when sitting. Toileting- Clothing Manipulation and Hygiene: Minimal assistance;Sit to/from stand Toileting - Clothing Manipulation Details (indicate cue type and reason): Pt cleaned self in sitting and standing.  Pt asks for this to be done for her but demonstrated ability to do for herself during session.     Functional mobility during ADLs: Min guard;Rolling walker (2 wheels) General ADL Comments: Pt appears to be most limited by poor endurance. Pt able to move with min guard but activity tolerance is limited.  Extremity/Trunk Assessment Upper Extremity Assessment Upper Extremity Assessment: Overall WFL for tasks assessed   Lower Extremity Assessment Lower Extremity Assessment: Defer to PT evaluation         Vision   Vision Assessment?: No apparent visual deficits Additional Comments: wears glasses all the time   Perception Perception Perception: Within Functional Limits   Praxis Praxis Praxis: Intact    Cognition Arousal/Alertness: Awake/alert Behavior During Therapy: WFL for tasks assessed/performed Overall Cognitive Status: Within Functional Limits for tasks assessed                                 General Comments: Orientedx4, able to follow commands consistently pleasant. Very HOH        Exercises      Shoulder Instructions       General Comments pt fatigues quickly therefor feel pt may need some post acute rehab before returning home.    Pertinent Vitals/ Pain       Pain Assessment Pain Assessment: Faces Faces Pain Scale: Hurts a little bit Pain Location: right shoulder and right flank Pain Descriptors / Indicators: Sore, Tender Pain Intervention(s): Monitored during session, Heat applied  Home Living                                          Prior Functioning/Environment              Frequency  Min 1X/week        Progress Toward Goals  OT Goals(current goals can now be found in the care plan section)  Progress towards OT goals: Progressing toward goals  Acute Rehab OT Goals Patient Stated Goal: to get stronger OT Goal Formulation: With patient Time For Goal Achievement: 12/24/22 Potential to Achieve Goals: Good ADL Goals Pt Will Perform Grooming: with supervision;standing Pt Will Perform Upper Body Dressing: with set-up;sitting Pt Will Perform Lower Body Dressing: with supervision;sit to/from stand Pt Will Transfer to Toilet: with supervision;ambulating Pt Will Perform Toileting - Clothing Manipulation and hygiene: with supervision;sit to/from stand Additional ADL Goal #1: Pt will state 3 things she can do at home to conserve energy during adls and functional mobilty with no cues.  Plan Discharge plan needs  to be updated    Co-evaluation                 AM-PAC OT "6 Clicks" Daily Activity     Outcome Measure   Help from another person eating meals?: None Help from another person taking care of personal grooming?: None Help from another person toileting, which includes using toliet, bedpan, or urinal?: A Little Help from another person bathing (including washing, rinsing, drying)?: A Lot Help from another person to put on and taking off regular upper body clothing?: A Little Help from another person to put on and taking off regular lower body clothing?: A Lot 6 Click Score: 18    End of Session Equipment Utilized During Treatment: Rolling walker (2 wheels);Oxygen  OT Visit Diagnosis: Unsteadiness on feet (R26.81);Muscle weakness (generalized) (M62.81)   Activity Tolerance Patient limited by fatigue   Patient Left in chair;with call bell/phone within reach;with chair alarm set   Nurse Communication Mobility status        Time: 1610-9604 OT Time Calculation (min): 27 min  Charges: OT General Charges $OT Visit: 1 Visit OT Treatments $  Self Care/Home Management : 23-37 mins   Hope Budds 12/15/2022, 9:19 AM

## 2022-12-15 NOTE — NC FL2 (Signed)
[place] MEDICAID FL2 LEVEL OF CARE FORM     IDENTIFICATION  Patient Name: Courtney Brown Birthdate: 01-21-1941 Sex: female Admission Date (Current Location): 12/05/2022  [facility] and [place] Number:  Producer, television/film/video and Address:  [facility],  [address] [place] [address]      Provider Number: [id number]  Attending Physician Name and Address:  Burnadette Pop, MD  Relative Name and Phone Number:       Current Level of Care: Hospital Recommended Level of Care: Skilled Nursing Facility Prior Approval Number:    Date Approved/Denied:   PASRR Number: [id number]  Discharge Plan: SNF    Current Diagnoses: Patient Active Problem List   Diagnosis Date Noted   Counseling and coordination of care 12/07/2022   Medication management 12/07/2022   Cancer associated pain 12/06/2022   Malignant neoplasm of upper lobe of right lung (HCC) 12/06/2022   High risk medication use 12/06/2022   Palliative care encounter 12/06/2022   Stage IV squamous cell carcinoma of right lung (HCC) 12/05/2022   DNR (do not resuscitate)/DNI(Do Not Intubate) 12/05/2022   Asymptomatic bacteriuria 12/05/2022   Squamous cell lung cancer (HCC) 11/04/2022   Cancer related pain 11/04/2022   Hypercalcemia 08/26/2022   Degenerative arthritis of right knee 07/09/2022   Spinal stenosis of lumbar region 07/09/2022   LRTI (lower respiratory tract infection) 06/04/2022   Anemia due to zinc deficiency 03/01/2022   Deficiency anemia 02/26/2022   Iron deficiency anemia secondary to inadequate dietary iron intake 02/26/2022   Thrombocytosis 02/26/2022   Vitamin B12 deficiency anemia due to intrinsic factor deficiency 02/26/2022   Coarse tremors 02/18/2022   Hyperlipidemia LDL goal <70 08/21/2021   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/2021   Prediabetes 01/30/2021   Incisional hernia 10/26/2020   Tobacco abuse 08/19/2019   Goals of care, counseling/discussion 06/03/2018   Chronic  respiratory failure with hypercapnia (HCC) 01/19/2018   Chronic bilateral low back pain with right-sided sciatica 09/01/2017   Acute respiratory failure with hypoxia (HCC)    Colon polyps 08/08/2016   PVD (peripheral vascular disease) (HCC) 08/08/2016   COPD GOLD II 07/29/2016   HTN (hypertension), benign 07/29/2016   CAD (coronary artery disease) 07/29/2016   PHN (postherpetic neuralgia) 07/29/2016   Adjustment disorder with mixed anxiety and depressed mood 07/29/2016   Osteoporosis 07/29/2016   Gastroesophageal reflux disease without esophagitis 07/29/2016   Hearing loss 07/29/2016    Orientation RESPIRATION BLADDER Height & Weight     Time, Self, Situation, Place  O2 (see dc summary) Continent Weight: 155 lb 6.8 oz (70.5 kg) Height:  5' (152.4 cm)  BEHAVIORAL SYMPTOMS/MOOD NEUROLOGICAL BOWEL NUTRITION STATUS      Continent Diet (see dc summary)  AMBULATORY STATUS COMMUNICATION OF NEEDS Skin   Extensive Assist Verbally Other (Comment) (Bilat sacrum wound, Skin tear L elbow)                       Personal Care Assistance Level of Assistance  Bathing, Dressing, Feeding Bathing Assistance: Limited assistance Feeding assistance: Independent Dressing Assistance: Limited assistance     Functional Limitations Info  Hearing, Sight, Speech Sight Info: Adequate Hearing Info: Adequate Speech Info: Adequate    SPECIAL CARE FACTORS FREQUENCY  OT (By licensed OT), PT (By licensed PT)     PT Frequency: 5x week OT Frequency: 3x week            Contractures Contractures Info: Not present    Additional Factors Info  Allergies, Code Status Code Status Info: DNR Allergies Info: Prednisone, Statins           Current Medications (12/15/2022):  This is the current hospital active medication list Current Facility-Administered Medications  Medication Dose Route Frequency Provider Last Rate Last Admin   acetaminophen (TYLENOL) tablet 650 mg  650 mg Oral Q6H PRN Kinsinger,  De Blanch, MD       Or   acetaminophen (TYLENOL) suppository 650 mg  650 mg Rectal Q6H PRN Kinsinger, De Blanch, MD       acetaminophen (TYLENOL) tablet 1,000 mg  1,000 mg Oral Q12H Rosalin Hawking, MD   1,000 mg at 12/15/22 0938   albuterol (PROVENTIL) (2.5 MG/3ML) 0.083% nebulizer solution 3 mL  3 mL Inhalation Q4H PRN Dorcas Carrow, MD   3 mL at 12/13/22 1058   ALPRAZolam (XANAX) tablet 0.25 mg  0.25 mg Oral QHS Kinsinger, De Blanch, MD   0.25 mg at 12/14/22 2141   Chlorhexidine Gluconate Cloth 2 % PADS 6 each  6 each Topical Daily Burnadette Pop, MD   6 each at 12/15/22 0937   furosemide (LASIX) tablet 40 mg  40 mg Oral Daily Burnadette Pop, MD   40 mg at 12/15/22 0935   heparin injection 5,000 Units  5,000 Units Subcutaneous Q8H Kinsinger, De Blanch, MD   5,000 Units at 12/15/22 0543   HYDROmorphone (DILAUDID) injection 1 mg  1 mg Intravenous Q2H PRN Dorcas Carrow, MD   1 mg at 12/08/22 1508   lactated ringers infusion   Intravenous Continuous Kinsinger, De Blanch, MD 10 mL/hr at 12/08/22 1147 Restarted at 12/08/22 1211   lidocaine (LIDODERM) 5 % 1 patch  1 patch Transdermal Q24H Kinsinger, De Blanch, MD   1 patch at 12/14/22 1722   lip balm (CARMEX) ointment   Topical PRN Kinsinger, De Blanch, MD   75 Application at 12/07/22 1808   melatonin tablet 10 mg  10 mg Oral QHS PRN Kinsinger, De Blanch, MD   10 mg at 12/07/22 2113   metoprolol succinate (TOPROL-XL) 24 hr tablet 50 mg  50 mg Oral Daily Kinsinger, De Blanch, MD   50 mg at 12/15/22 0935   mometasone-formoterol (DULERA) 200-5 MCG/ACT inhaler 2 puff  2 puff Inhalation BID Kinsinger, De Blanch, MD   2 puff at 12/15/22 0804   morphine (MS CONTIN) 12 hr tablet 15 mg  15 mg Oral Q12H Kinsinger, De Blanch, MD   15 mg at 12/15/22 0935   nystatin (MYCOSTATIN) 100000 UNIT/ML suspension 500,000 Units  5 mL Mouth/Throat QID Kinsinger, De Blanch, MD   500,000 Units at 12/15/22 0935   ondansetron (ZOFRAN) tablet 4 mg  4 mg Oral Q6H PRN  Kinsinger, De Blanch, MD   4 mg at 12/06/22 1514   Or   ondansetron (ZOFRAN) injection 4 mg  4 mg Intravenous Q6H PRN Kinsinger, De Blanch, MD   4 mg at 12/13/22 1157   oxyCODONE (Oxy IR/ROXICODONE) immediate release tablet 5 mg  5 mg Oral Q4H PRN Rosalin Hawking, MD   5 mg at 12/14/22 1723   pantoprazole (PROTONIX) EC tablet 40 mg  40 mg Oral Daily Kinsinger, De Blanch, MD   40 mg at 12/15/22 0935   polyethylene glycol (MIRALAX / GLYCOLAX) packet 17 g  17 g Oral BID Rosalin Hawking, MD   17 g at 12/14/22 2142   primidone (MYSOLINE) tablet 50 mg  50 mg Oral QHS Kinsinger, De Blanch, MD   50 mg at 12/14/22 2142  senna (SENOKOT) tablet 17.2 mg  2 tablet Oral Daily Rosalin Hawking, MD   17.2 mg at 12/14/22 1002   sodium chloride flush (NS) 0.9 % injection 10-40 mL  10-40 mL Intracatheter Q12H Adhikari, Amrit, MD   10 mL at 12/15/22 0939   sodium chloride flush (NS) 0.9 % injection 10-40 mL  10-40 mL Intracatheter PRN Burnadette Pop, MD         Discharge Medications: Please see discharge summary for a list of discharge medications.  Relevant Imaging Results:  Relevant Lab Results:   Additional Information SSN: [id number] LCSW

## 2022-12-15 NOTE — Care Management Important Message (Signed)
Important Message  Patient Details IM Letter given. Name: Courtney Brown MRN: [id number] Date of Birth: Oct 27, 1940   Medicare Important Message Given:  Yes     Caren Macadam 12/15/2022, 9:29 AM

## 2022-12-15 NOTE — Progress Notes (Signed)
Initial Nutrition Assessment  DOCUMENTATION CODES:   Non-severe (moderate) malnutrition in context of chronic illness  INTERVENTION:  - Advance diet to Regular, per discussion with MD. - Boost Breeze po BID, each supplement provides 250 kcal and 9 grams of protein - Encourage intake as tolerated. - Multivitamin with minerals daily.  - Monitor weight trends.   NUTRITION DIAGNOSIS:   Moderate Malnutrition related to chronic illness as evidenced by mild fat depletion, mild muscle depletion.  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  Patient reports UBW of 140# and weight loss earlier in the year but not recently.  Per EMR, patient lost 16# or 10.5% body weight from July to April, which is not significant for the time frame. Weight has been stable since then, noted to be elevated this admission likely due to generalized mild/moderate edema.  She endorses typically eating 2-3 meals a day but the 2-3 weeks PTA she was feeling poorly due to pain so was not eating well. Drinking Boost occasionally at home. Notes she would drink when she felt she hadn't eaten enough that day.  Current appetite is decreased but patient reports trying to eat something at each meal. Documented to be consuming an average of 31% of meals over the past week.  Shares she has been receiving automatic trays and sometimes doesn't like what she gets so will not eat it. Would like to order her own tray. Provided patient with menu and informed her how to order trays.  Discussed importance of ordering and trying to eat 3 meals a day to support meeting increased calorie and protein  needs. She is agreeable to receive nutrition supplements during admission.   Per discussion with MD, can advance diet to Regular as she has been on a soft diet since 5/15 and tolerating well.   Medications reviewed and include: Lasix, Miralax, Senokot  Labs reviewed:  Na 134   NUTRITION - FOCUSED PHYSICAL EXAM:  Flowsheet Row Most Recent Value  Orbital Region Mild depletion  Upper Arm Region No depletion  Thoracic and Lumbar Region No depletion  Buccal Region Mild depletion  Temple Region Mild depletion  Clavicle Bone Region Mild depletion  Clavicle and Acromion Bone Region Mild depletion  Scapular Bone Region Unable to assess  Dorsal Hand No depletion  Patellar Region No depletion  Anterior Thigh Region No depletion  Posterior Calf Region No depletion  Edema (RD Assessment) None  Hair Reviewed  Eyes Reviewed  Mouth Reviewed  Skin Reviewed  Nails Reviewed       Diet Order:   Diet Order             DIET SOFT Room service appropriate? Yes; Fluid consistency: Thin  Diet effective now                   EDUCATION NEEDS:  Education needs have been addressed  Skin:  Skin Assessment: Skin Integrity Issues: Skin Integrity Issues:: Incisions, Other (Comment) Incisions: R chest Other: Bilateral sacral wound  Last BM:  5/18  Height:  Ht Readings from Last 1 Encounters:  12/08/22 5' (1.524 m)   Weight:  Wt Readings from  Last 1 Encounters:  12/15/22 70.5 kg    BMI:  Body mass index is 30.35 kg/m.  Estimated Nutritional Needs:  Kcal:  1850-2100 kcals Protein:  80-100 grams Fluid:  >/= 1.8L    Shelle Iron RD, LDN For contact information, refer to [facility].

## 2022-12-15 NOTE — Consult Note (Signed)
   [facility] CM Inpatient Consult   12/15/2022  Daneesha Helmes 1941/02/17 [id number]  [facility] [[facility]]  Accountable Care Organization [ACO] Patient: [employer] Medicare [Dual]  *[facility] Liaison remote coverage review for patient admitted to [facility]    Primary Care Provider:  [name], MD, with [place] [facility] which is listed to provide the [facility] follow up   Patient was  reviewed for 10 day LOS and  is currently active with [facility] [[facility]] Care Management for care coordination/management services.  Patient has been engaged by a [facility] RNCC.  Our community based plan of care has focused on disease management and community resource support.    Plan:  Patient is currently for SNF rehab.  If patient transitions to a [facility] affiliated facility will alert [facility] RN of any post facility care coordination needs that are known and that patient was active with [facility] RN CC prior to admission.  Of note, [facility] Care Management services does not replace or interfere with any services that are needed or arranged by inpatient [facility] care management team.   For additional questions or referrals please contact:  Charlesetta Shanks, RN BSN CCM [facility]  [phone] business mobile phone Toll free office [phone]  *Concierge Line  [phone] Fax number: [phone] [email or web address]

## 2022-12-15 NOTE — Progress Notes (Signed)
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 PMH: arthritis, melanoma, COPD, emphysema, HTN, MI, OP, PNA, TIA    PT Comments    Pt making gradual progress but does continue to need min guard to min A for transfers, fatigues very easily, and remains fall risk.  Her gait speed is only 0.1 ft/sec which is not functional and indicative of fall risk.  She would continue to benefit from acute and post acute therapy prior to return home with family to improve mobility and safety.     Recommendations for follow up therapy are one component of a multi-disciplinary discharge planning process, led by the attending physician.  Recommendations may be updated based on patient status, additional functional criteria and insurance authorization.  Follow Up Recommendations  Can patient physically be transported by private vehicle: Yes    Assistance Recommended at Discharge Intermittent Supervision/Assistance  Patient can return home with the following Help with stairs or ramp for entrance;Assistance with cooking/housework;Assist for transportation;A little help with walking and/or transfers;A little help with bathing/dressing/bathroom   Equipment Recommendations  None recommended by PT    Recommendations for Other Services       Precautions / Restrictions Precautions Precautions: Fall     Mobility  Bed Mobility Overal bed mobility: Needs Assistance Bed Mobility: Supine to Sit     Supine to sit: Min assist, HOB elevated     General bed mobility comments: Cues for bed rail; min A to scoot forward    Transfers Overall transfer level: Needs assistance Equipment used: Rolling walker (2 wheels) Transfers: Sit  to/from Stand Sit to Stand: Min guard Stand pivot transfers: Min guard         General transfer comment: cues for hand placement,; sit to stand from bed x 2 and bsc x 1; stand pivot to/from bed and bsc    Ambulation/Gait Ambulation/Gait assistance: Min guard Gait Distance (Feet): 20 Feet Assistive device: Rolling walker (2 wheels) Gait Pattern/deviations: Step-to pattern, Decreased stride length Gait velocity: .1 ft/sec Gait velocity interpretation: <1.8 ft/sec, indicate of risk for recurrent falls   General Gait Details: fatigued easily , slow speed, min cues for RW proximity   Stairs             Wheelchair Mobility    Modified Rankin (Stroke Patients Only)       Balance Overall balance assessment: Needs assistance Sitting-balance support: Feet supported Sitting balance-Leahy Scale: Good     Standing balance support: Bilateral upper extremity supported, Reliant on assistive device for balance Standing balance-Leahy Scale: Poor Standing balance comment: Min guard with RW                            Cognition Arousal/Alertness: Awake/alert Behavior During Therapy: WFL for tasks assessed/performed Overall Cognitive Status: Within Functional Limits for tasks assessed                                          Exercises Other Exercises Other Exercises: Held further exercise due to meal arrived and wanting to eat    General Comments General comments (skin integrity, edema, etc.):  Pt on 2 L O2 with VSS      Pertinent Vitals/Pain Pain Assessment Pain Assessment: No/denies pain    Home Living                          Prior Function            PT Goals (current goals can now be found in the care plan section) Progress towards PT goals: Progressing toward goals    Frequency    Min 1X/week      PT Plan Current plan remains appropriate    Co-evaluation              AM-PAC PT "6 Clicks" Mobility    Outcome Measure  Help needed turning from your back to your side while in a flat bed without using bedrails?: A Little Help needed moving from lying on your back to sitting on the side of a flat bed without using bedrails?: A Little Help needed moving to and from a bed to a chair (including a wheelchair)?: A Little Help needed standing up from a chair using your arms (e.g., wheelchair or bedside chair)?: A Little Help needed to walk in hospital room?: A Lot (limited distance and speed) Help needed climbing 3-5 steps with a railing? : A Lot 6 Click Score: 16    End of Session Equipment Utilized During Treatment: Gait belt Activity Tolerance: Patient tolerated treatment well Patient left: with chair alarm set;in chair;with call bell/phone within reach Nurse Communication: Mobility status PT Visit Diagnosis: Difficulty in walking, not elsewhere classified (R26.2)     Time: 7829-5621 PT Time Calculation (min) (ACUTE ONLY): 24 min  Charges:  $Gait Training: 8-22 mins $Therapeutic Activity: 8-22 mins                     Anise Salvo, PT Acute Rehab [facility] Rehab [phone]    Rayetta Humphrey 12/15/2022, 5:57 PM

## 2022-12-15 NOTE — Progress Notes (Signed)
PROGRESS NOTE  Courtney Brown  ZOX:[id number] DOB: [birth year] DOA: 12/05/2022 PCP: Courtney Grandchild, MD   Brief Narrative: Patient is 82 year old female with history of COPD, hypertension, recently diagnosed stage IV squamous cell lung cancer with metastasis to the ribs presented to the emergency department  with complaint of worsening pain and shortness of breath.  On presentation she was hemodynamically stable.  CT angiogram of the chest /abdomen/pelvis showed necrotic right upper lung mass with increased size from recent exam with bony destruction of the third and fourth rib on the right side, multiple pulmonary disease, metastatic lesion on the left lung.  She was admitted for intractable pain from cancer.  Hospital course also remarkable for severe epigastric pain, found to have pancreatitis. She remains very weak, deconditioned .we requested PT evaluation.  Family requesting for SNF consideration.  Will be discharged to SNF as soon as bed is available  Assessment & Plan:  Principal Problem:   Stage IV squamous cell carcinoma of right lung (HCC) Active Problems:   Acute respiratory failure with hypoxia (HCC)   Cancer related pain   COPD GOLD II   HTN (hypertension), benign   DNR (do not resuscitate)/DNI(Do Not Intubate)   Asymptomatic bacteriuria   Cancer associated pain   Malignant neoplasm of upper lobe of right lung (HCC)   High risk medication use   Palliative care encounter   Counseling and coordination of care   Medication management  Stage IV lung cancer/cancer related pain: Continue pain medications as ordered.  Currently hemodynamically stable.  On MS Contin, oxycodone, Tylenol, lidocaine patches. Follows with oncology as an outpatient.  Port-A-Cath placed on 5/13.  S/p  radiation treatment. Plan for starting chemotherapy on 28th of this month.  Her performance status is very poor.  I am not sure how she can tolerate chemotherapy.  Acute pancreatitis: Complained of severe  epigastric pain on 5/13.  CT scan showed acute pancreatitis without complication or necrosis.  Lipase minimally elevated but now has resolved.  IV fluids will be discontinued due to concern of volume overload.  Diet advanced to solid,she is tolerating.  She denies any significant nausea or abdominal pain today. Complains of diarrhea so MiraLAX and Senokot discontinued  Leukocytosis: Unclear etiology but most likely secondary to malignancy.  Cultures have been negative so far.  Patient has been empirically started on Zosyn for rule out intra-abdominal infection.  Does not have fever.  Procalcitonin is nonreassuring Continue to monitor, leukocytosis finally improving.  Stopped antibiotics  Diastolic CHF: Has bilateral lower extremity edema.  IV fluids  discontinued.  Had some crackles on the left lung on 5/15.CXR on 5/15 showed large right upper lobe lung mass with adjacent pleural disease or effusion. Last echo showed EF of 55 to 60%, grade 1 diastolic dysfunction.Given a dose of lasix on 5/15,5/16. venous Doppler of lower extremity negative for dvt. Has persistent edema bilateral extremities.  Likely contributed by low albumin.  She could not get compression stockings.  Continue oral Lasix, potassium supplementation  Hypomagnesemia: Currently being monitored and supplemented as needed  COPD: Currently without exacerbation.  Continue as needed bronchodilators.She qualified for home oxygen for 2 L  Hypertension: On Toprol.  Monitor blood pressure  Goals of care: Poor prognosis secondary to stage IV lung cancer with mets.  Palliative care following for goals of care.  CODE STATUS is DNR.Plan for outpatient follow up with palliative care.  Patient, family still planning to continue chemotherapy that has been planned to start on 5/28.  Her performance status is very poor.  She is very weak. PT recommending home health.  Daughter says  she is unable to take care of her at home and requesting for SNF  consideration  Obesity: BMI of 30.5       DVT prophylaxis:heparin injection 5,000 Units Start: 12/06/22 0600 SCDs Start: 12/05/22 2355     Code Status: DNR  Family Communication: Called daughter Courtney Brown on phone on 5/20  Patient status:Inpatient  Patient is from :home  Anticipated discharge to: SnF  Estimated DC date: Whenever possible   Consultants: General surgery,palliative care,oncology  Procedures:Port a cath placement  Antimicrobials:  Anti-infectives (From admission, onward)    Start     Dose/Rate Route Frequency Ordered Stop   12/09/22 1800  piperacillin-tazobactam (ZOSYN) IVPB 3.375 g  Status:  Discontinued        3.375 g 12.5 mL/hr over 240 Minutes Intravenous Every 8 hours 12/09/22 1448 12/14/22 1129   12/09/22 0800  meropenem (MERREM) 1 g in sodium chloride 0.9 % 100 mL IVPB  Status:  Discontinued        1 g 200 mL/hr over 30 Minutes Intravenous Every 12 hours 12/09/22 0756 12/09/22 1448   12/08/22 1200  ceFAZolin (ANCEF) IVPB 2g/100 mL premix        2 g 200 mL/hr over 30 Minutes Intravenous On call to O.R. 12/08/22 1114 12/08/22 1233   12/05/22 2300  piperacillin-tazobactam (ZOSYN) IVPB 3.375 g  Status:  Discontinued        3.375 g 12.5 mL/hr over 240 Minutes Intravenous Every 8 hours 12/05/22 2238 12/06/22 1356   12/05/22 1700  cefTRIAXone (ROCEPHIN) 1 g in sodium chloride 0.9 % 100 mL IVPB        1 g 200 mL/hr over 30 Minutes Intravenous  Once 12/05/22 1654 12/05/22 2107       Subjective: Patient seen and examined at bedside today.  Hemodynamically stable denies any abdomen pain.  Tolerating diet. Sitting on chair.  Appears very weak and deconditioned but feels better today.  Complains of loose stools since last few days  Objective: Vitals:   12/14/22 2039 12/15/22 0500 12/15/22 0501 12/15/22 0805  BP: (!) 122/40  (!) 122/46   Pulse: (!) 104  84   Resp: 18  18   Temp: 98 F (36.7 C)  (!) 97.5 F (36.4 C)   TempSrc: Oral  Oral   SpO2: 92%   100% 98%  Weight:  70.5 kg    Height:        Intake/Output Summary (Last 24 hours) at 12/15/2022 1128 Last data filed at 12/14/2022 2200 Gross per 24 hour  Intake 290 ml  Output --  Net 290 ml   Filed Weights   12/12/22 0500 12/13/22 0500 12/15/22 0500  Weight: 71 kg 74.5 kg 70.5 kg    Examination:   General exam: Weak, deconditioned HEENT: PERRL Respiratory system: Diminished sounds on the left side cardiovascular system: S1 & S2 heard, RRR.  Chemo-Port Gastrointestinal system: Abdomen is nondistended, soft and nontender. Central nervous system: Alert and oriented Extremities: Bilateral lower extremity pitting edema, no clubbing ,no cyanosis Skin: No rashes, no ulcers,no icterus     Data Reviewed: I have personally reviewed following labs and imaging studies  CBC: Recent Labs  Lab 12/08/22 1530 12/09/22 0556 12/10/22 0539 12/11/22 0838 12/12/22 0444 12/13/22 0500 12/14/22 0457  WBC 25.4* 30.1* 34.3* 30.1* 22.9* 20.2* 22.9*  NEUTROABS 22.1* 26.2* 29.1*  --   --   --   --  HGB 10.0* 9.7* 9.8* 8.9* 8.2* 8.3* 8.1*  HCT 33.2* 33.7* 32.9* 29.8* 26.3* 27.4* 26.9*  MCV 80.8 84.5 82.0 80.5 80.4 80.1 80.8  PLT 656* 557* 531* 549* 485* 491* 511*   Basic Metabolic Panel: Recent Labs  Lab 12/08/22 1530 12/10/22 0539 12/11/22 0838 12/12/22 0444 12/13/22 0500  NA  --  136 134* 135 134*  K  --  3.8 3.3* 3.7 3.9  CL  --  105 100 99 99  CO2  --  24 27 29 31   GLUCOSE  --  89 85 97 91  BUN  --  8 8 8 9   CREATININE  --  0.49 0.53 0.62 0.58  CALCIUM  --  8.7* 8.8* 8.6* 8.9  MG 1.5* 1.3* 1.6* 1.8  --   PHOS 3.6  --   --   --   --      Recent Results (from the past 240 hour(s))  Urine Culture     Status: Abnormal   Collection Time: 12/05/22  2:39 PM   Specimen: Urine, Clean Catch  Result Value Ref Range Status   Specimen Description   Final    URINE, CLEAN CATCH Performed at [facility], [address] [place], [place] [address]    Special  Requests   Final    NONE Performed at [facility], [address], [place], [place] [address]    Culture MULTIPLE SPECIES PRESENT, SUGGEST RECOLLECTION (A)  Final   Report Status 12/06/2022 FINAL  Final  Culture, blood (single) w Reflex to ID Panel     Status: None   Collection Time: 12/08/22  6:19 PM   Specimen: BLOOD LEFT ARM  Result Value Ref Range Status   Specimen Description   Final    BLOOD LEFT ARM Performed at [facility], [address] [place], [place] [address]    Special Requests   Final    BOTTLES DRAWN AEROBIC AND ANAEROBIC Blood Culture results may not be optimal due to an inadequate volume of blood received in culture bottles Performed at [facility], [address] [place], [place] [address]    Culture   Final    NO GROWTH 5 DAYS Performed at [facility], [address] [place], [place] [address]    Report Status 12/13/2022 FINAL  Final     Radiology Studies: No results found.  Scheduled Meds:  acetaminophen  1,000 mg Oral Q12H   ALPRAZolam  0.25 mg Oral QHS   Chlorhexidine Gluconate Cloth  6 each Topical Daily   furosemide  40 mg Oral Daily   heparin  5,000 Units Subcutaneous Q8H   lidocaine  1 patch Transdermal Q24H   metoprolol succinate  50 mg Oral Daily   mometasone-formoterol  2 puff Inhalation BID   morphine  15 mg Oral Q12H   nystatin  5 mL Mouth/Throat QID   pantoprazole  40 mg Oral Daily   polyethylene glycol  17 g Oral BID   primidone  50 mg Oral QHS   senna  2 tablet Oral Daily   sodium chloride flush  10-40 mL Intracatheter Q12H   Continuous Infusions:  lactated ringers 10 mL/hr at 12/08/22 1147     LOS: 9 days   Burnadette Pop, MD [facility] P5/20/2024, 11:28 AM

## 2022-12-16 ENCOUNTER — Ambulatory Visit
Admission: RE | Admit: 2022-12-16 | Discharge: 2022-12-16 | Disposition: A | Discharge status: Home / Self Care | Payer: 59 | Source: Ambulatory Visit | Attending: Radiation Oncology | Admitting: Radiation Oncology

## 2022-12-16 ENCOUNTER — Telehealth: Payer: Self-pay | Admitting: Oncology

## 2022-12-16 ENCOUNTER — Other Ambulatory Visit: Payer: Self-pay

## 2022-12-16 DIAGNOSIS — Z510 Encounter for antineoplastic radiation therapy: Secondary | ICD-10-CM | POA: Diagnosis not present

## 2022-12-16 DIAGNOSIS — E440 Moderate protein-calorie malnutrition: Secondary | ICD-10-CM | POA: Insufficient documentation

## 2022-12-16 DIAGNOSIS — C3411 Malignant neoplasm of upper lobe, right bronchus or lung: Secondary | ICD-10-CM | POA: Diagnosis not present

## 2022-12-16 DIAGNOSIS — Z87891 Personal history of nicotine dependence: Secondary | ICD-10-CM | POA: Diagnosis not present

## 2022-12-16 DIAGNOSIS — C3491 Malignant neoplasm of unspecified part of right bronchus or lung: Secondary | ICD-10-CM | POA: Diagnosis not present

## 2022-12-16 LAB — RAD ONC ARIA SESSION SUMMARY
Course Elapsed Days: 5
Plan Fractions Treated to Date: 4
Plan Prescribed Dose Per Fraction: 3 Gy
Plan Total Fractions Prescribed: 10
Plan Total Prescribed Dose: 30 Gy
Reference Point Dosage Given to Date: 12 Gy
Reference Point Session Dosage Given: 3 Gy
Session Number: 4

## 2022-12-16 LAB — BASIC METABOLIC PANEL
Anion gap: 7 (ref 5–15)
BUN: 12 mg/dL (ref 8–23)
CO2: 32 mmol/L (ref 22–32)
Calcium: 8.8 mg/dL — ABNORMAL LOW (ref 8.9–10.3)
Chloride: 96 mmol/L — ABNORMAL LOW (ref 98–111)
Creatinine, Ser: 0.55 mg/dL (ref 0.44–1.00)
GFR, Estimated: >60 mL/min (ref >60)
Glucose, Bld: 86 mg/dL (ref 70–99)
Potassium: 4.1 mmol/L (ref 3.5–5.1)
Sodium: 135 mmol/L (ref 135–145)

## 2022-12-16 LAB — CBC
HCT: 26.8 % — ABNORMAL LOW (ref 36.0–46.0)
Hemoglobin: 8.1 g/dL — ABNORMAL LOW (ref 12.0–15.0)
MCH: 24.6 pg — ABNORMAL LOW (ref 26.0–34.0)
MCHC: 30.2 g/dL (ref 30.0–36.0)
MCV: 81.5 fL (ref 80.0–100.0)
Platelets: 592 10*3/uL — ABNORMAL HIGH (ref 150–400)
RBC: 3.29 MIL/uL — ABNORMAL LOW (ref 3.87–5.11)
RDW: 18.6 % — ABNORMAL HIGH (ref 11.5–15.5)
WBC: 21.6 10*3/uL — ABNORMAL HIGH (ref 4.0–10.5)
nRBC: 0 % (ref 0.0–0.2)

## 2022-12-16 NOTE — Progress Notes (Signed)
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 PMH: arthritis, melanoma, COPD, emphysema, HTN, MI, OP, PNA, TIA   OT comments  The pt was motivated to participate in the session, though she reported feeling "short winded." She subsequently required intermittent therapeutic rest breaks and cues to implement deep breathing exercises as needed during progressive activity. She was assisted into standing, as well as to the bedside chair to promote out of bed activity and tolerance. She will continue to benefit from OT services to maximize her independence with ADLs & to decrease the risk for restricted participation in meaningful activities.    Recommendations for follow up therapy are one component of a multi-disciplinary discharge planning process, led by the attending physician.  Recommendations may be updated based on patient status, additional functional criteria and insurance authorization.    Assistance Recommended at Discharge Intermittent Supervision/Assistance  Patient can return home with the following  Assistance with cooking/housework;Assist for transportation;Help with stairs or ramp for entrance;A little help with walking and/or transfers;A little help with bathing/dressing/bathroom   Equipment Recommendations  BSC/3in1       Precautions / Restrictions Precautions Precautions: Fall Precaution Comments: monitor sats Restrictions Weight Bearing Restrictions: No       Mobility Bed Mobility Overal bed mobility: Needs Assistance Bed Mobility: Supine to Sit     Supine to sit: HOB elevated, Supervision          Transfers Overall transfer level: Needs assistance Equipment used: Rolling  walker (2 wheels) Transfers: Sit to/from Stand Sit to Stand: Min guard     Step pivot transfers: Min guard     General transfer comment: Pt transferred from the EOB to the bedside chair         ADL either performed or assessed with clinical judgement   ADL Overall ADL's : Needs assistance/impaired Eating/Feeding: Independent;Sitting   Grooming: Set up;Sitting           Upper Body Dressing : Set up;Sitting   Lower Body Dressing: Moderate assistance                       Vision Baseline Vision/History: 1 Wears glasses            Cognition Arousal/Alertness: Awake/alert Behavior During Therapy: WFL for tasks assessed/performed Overall Cognitive Status: Within Functional Limits for tasks assessed                              Pertinent Vitals/ Pain       Pain Assessment Pain Assessment: 0-10 Pain Score: 8  Pain Location: right shoulder and right flank Pain Descriptors / Indicators: Sore, Tender Pain Intervention(s): Limited activity within patient's tolerance, Monitored during session, Other (comment) (Patient reported receiving pain medication)         Frequency  Min 1X/week        Progress Toward Goals  OT Goals(current goals can now be found in the care plan section)  Progress towards OT goals: Progressing toward goals  Acute Rehab OT Goals Patient Stated Goal: to get better OT Goal Formulation: With patient Time For Goal Achievement: 12/24/22 Potential to Achieve Goals: Good  Plan Discharge plan needs to be updated  AM-PAC OT "6 Clicks" Daily Activity     Outcome Measure   Help from another person eating meals?: None Help from another person taking care of personal grooming?: None Help from another person toileting, which includes using toliet, bedpan, or urinal?: A Little Help from another person bathing (including washing, rinsing, drying)?: A Lot Help from another person to put on and taking off regular upper  body clothing?: A Little Help from another person to put on and taking off regular lower body clothing?: A Lot 6 Click Score: 18    End of Session Equipment Utilized During Treatment: Rolling walker (2 wheels);Oxygen;Gait belt  OT Visit Diagnosis: Unsteadiness on feet (R26.81);Muscle weakness (generalized) (M62.81)   Activity Tolerance Other (comment) (Limited by decreased endurance)   Patient Left in chair;with call bell/phone within reach;with chair alarm set   Nurse Communication Mobility status        Time: 4098-1191 OT Time Calculation (min): 15 min  Charges: OT General Charges $OT Visit: 1 Visit OT Treatments $Therapeutic Activity: 8-22 mins      Reuben Likes, OTR/L 12/16/2022, 5:21 PM

## 2022-12-16 NOTE — TOC Progression Note (Addendum)
Transition of Care [facility]) - Progression Note    Patient Details  Name: Courtney Brown MRN: [id number] Date of Birth: 25-Dec-1940  [facility] CM/SW Contact  Courtney Busing, RN Phone [phone]  12/16/2022, 8:30 AM  Clinical Narrative:    CM following up from handoff that [facility] may not be able to make bed offer due to patient having daily radiation. Per handoff Star with admissions was checking to determine if facility can offer bed with daughter to transport to radiation. Message has been sent to [facility] this am. Awaiting return call from Star.   25 Star with Admissions at [facility] has updated CM that facility can not accept the patient.   1221 CM at bedside to update patient about [facility] not being able to make bed offer. Patient requested that CM speak with daughter Courtney Brown.  CM spoke with daughter Courtney Brown via phone [phone]. Daughter has been made aware that [facility] can not offer bed. CM has provided daughter with list of other bed offers. Daughter to review offers and get back with CM. Will await return call.   1400 CM spoke with daughter Courtney Brown. Daughter has decided on on [facility]. Courtney Brown with [facility] has been made aware and acknowledges that facility can extend a bed offer. Insurance auth updated with facility name Auth pending.   1550 CM received message from [place] at [facility]. Facility can has retracted bed offer. CM attempted to call daughter to make her aware. Daughter does not answer, voicemail left. Will await return call to discuss other options.  Expected Discharge Plan: Skilled Nursing Facility Barriers to Discharge: SNF Pending bed offer, Insurance Authorization  Expected Discharge Plan and Services In-house Referral: Clinical Social Work   Post Acute Care Choice: Skilled Nursing Facility Living arrangements for the past 2 months: Single Family Home                                       Social Determinants of Health (SDOH)  Interventions SDOH Screenings   Food Insecurity: No Food Insecurity (12/06/2022)  Housing: Low Risk  (12/06/2022)  Transportation Needs: No Transportation Needs (12/06/2022)  Utilities: Not At Risk (12/06/2022)  Depression (PHQ2-9): Low Risk  (12/02/2022)  Financial Resource Strain: Low Risk  (11/01/2022)  Physical Activity: Sufficiently Active (11/01/2022)  Social Connections: Unknown (11/01/2022)  Stress: No Stress Concern Present (11/01/2022)  Tobacco Use: Medium Risk (12/09/2022)    Readmission Risk Interventions     No data to display

## 2022-12-16 NOTE — Progress Notes (Signed)
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  On presentation she was hemodynamically stable.  CT angiogram of the chest /abdomen/pelvis showed necrotic right upper lung mass with increased size from recent exam with bony destruction of the third and fourth rib on the right side, multiple pulmonary disease, metastatic lesion on the left lung.  She was admitted for intractable pain from cancer.  Hospital course also remarkable for severe epigastric pain, found to have pancreatitis. She remains very weak, deconditioned .we requested PT evaluation.  Family requesting for SNF consideration. She is medically stable for discharge to SNF as soon as bed is available,TOC following  Assessment & Plan:  Principal Problem:   Stage IV squamous cell carcinoma of right lung (HCC) Active Problems:   Acute respiratory failure with hypoxia (HCC)   Cancer related pain   COPD GOLD II   HTN (hypertension), benign   DNR (do not resuscitate)/DNI(Do Not Intubate)   Asymptomatic bacteriuria   Cancer associated pain   Malignant neoplasm of upper lobe of right lung (HCC)   High risk medication use   Palliative care encounter   Counseling and coordination of care   Medication management   Malnutrition of moderate degree  Stage IV lung cancer/cancer related pain: Continue pain medications as ordered.  Currently hemodynamically stable.  On MS Contin, oxycodone, Tylenol, lidocaine patches. Follows with oncology as an outpatient.  Port-A-Cath placed on 5/13.  On radiation treatment. Plan for starting chemotherapy on 28th of this month.  Her performance status is very poor.  I am not sure how she can  tolerate chemotherapy.  Acute pancreatitis: Complained of severe epigastric pain on 5/13.  CT scan showed acute pancreatitis without complication or necrosis.  Lipase minimally elevated but now has resolved.  IV fluids will be discontinued due to concern of volume overload.  Diet advanced to solid,she is tolerating.  She denies any significant nausea or abdominal pain today. Complains of diarrhea so MiraLAX and Senokot discontinued  Leukocytosis: Unclear etiology but most likely secondary to malignancy.  Cultures have been negative so far.  Patient has been empirically started on Zosyn for rule out intra-abdominal infection.  Does not have fever.  Procalcitonin is nonreassuring Continue to monitor, leukocytosis finally improving.  Stopped antibiotics  Diastolic CHF: Has bilateral lower extremity edema.  IV fluids  discontinued.  Had some crackles on the left lung on 5/15.CXR on 5/15 showed large right upper lobe lung mass with adjacent pleural disease or effusion. Last echo showed EF of 55 to 60%, grade 1 diastolic dysfunction.Given a dose of lasix on 5/15,5/16. venous Doppler of lower extremity negative for dvt. Has persistent edema bilateral extremities.  Likely contributed by low albumin.  She could not tolerate compression stockings.  Continue oral Lasix, potassium supplementation  Hypomagnesemia: Currently being monitored and supplemented as needed  COPD: Currently without exacerbation.  Continue as needed bronchodilators.She qualified for home oxygen for 2 L  Hypertension: On Toprol.  Monitor blood pressure  Goals of care: Poor prognosis secondary to stage IV lung cancer with mets.  Palliative care following for goals of care.  CODE STATUS is DNR.Plan for outpatient follow up with palliative care.  Patient, family still planning to continue chemotherapy that  has been planned to start on 5/28.  Her performance status is very poor.  She is very weak. PT recommending home health.  Daughter says   she is unable to take care of her at home and requesting for SNF consideration  Obesity: BMI of 30.5  Nutrition Problem: Moderate Malnutrition Etiology: chronic illness    DVT prophylaxis:heparin injection 5,000 Units Start: 12/06/22 0600 SCDs Start: 12/05/22 2355     Code Status: DNR  Family Communication: Called daughter [name] on phone on 5/20  Patient status:Inpatient  Patient is from :home  Anticipated discharge to: SnF  Estimated DC date: Whenever possible   Consultants: General surgery,palliative care,oncology  Procedures:Port a cath placement  Antimicrobials:  Anti-infectives (From admission, onward)    Start     Dose/Rate Route Frequency Ordered Stop   12/09/22 1800  piperacillin-tazobactam (ZOSYN) IVPB 3.375 g  Status:  Discontinued        3.375 g 12.5 mL/hr over 240 Minutes Intravenous Every 8 hours 12/09/22 1448 12/14/22 1129   12/09/22 0800  meropenem (MERREM) 1 g in sodium chloride 0.9 % 100 mL IVPB  Status:  Discontinued        1 g 200 mL/hr over 30 Minutes Intravenous Every 12 hours 12/09/22 0756 12/09/22 1448   12/08/22 1200  ceFAZolin (ANCEF) IVPB 2g/100 mL premix        2 g 200 mL/hr over 30 Minutes Intravenous On call to O.R. 12/08/22 1114 12/08/22 1233   12/05/22 2300  piperacillin-tazobactam (ZOSYN) IVPB 3.375 g  Status:  Discontinued        3.375 g 12.5 mL/hr over 240 Minutes Intravenous Every 8 hours 12/05/22 2238 12/06/22 1356   12/05/22 1700  cefTRIAXone (ROCEPHIN) 1 g in sodium chloride 0.9 % 100 mL IVPB        1 g 200 mL/hr over 30 Minutes Intravenous  Once 12/05/22 1654 12/05/22 2107       Subjective: Patient seen and examined at bedside today.  Hemodynamically stable.  Overall comfortable.  Lying in bed.  Denies any abdomen pain, nausea or vomiting today.  On 2 L of oxygen per minute.  Remains weak  Objective: Vitals:   12/16/22 0416 12/16/22 0417 12/16/22 0913 12/16/22 0917  BP: (!) 121/55 (!) 121/55    Pulse: 77 78    Resp:  18 18    Temp: 98 F (36.7 C) 98 F (36.7 C)    TempSrc: Oral Oral    SpO2: 100% 100% (!) 87% 94%  Weight:      Height:        Intake/Output Summary (Last 24 hours) at 12/16/2022 1237 Last data filed at 12/16/2022 0900 Gross per 24 hour  Intake 130 ml  Output --  Net 130 ml   Filed Weights   12/12/22 0500 12/13/22 0500 12/15/22 0500  Weight: 71 kg 74.5 kg 70.5 kg    Examination:   General exam: Very weak and deconditioned HEENT: PERRL Respiratory system:  no wheezes or crackles, diminished sounds on the left side Cardiovascular system: S1 & S2 heard, RRR.  Chemo-Port Gastrointestinal system: Abdomen is nondistended, soft and nontender. Central nervous system: Alert and oriented Extremities: Bilateral lower extremity pitting edema, no clubbing ,no cyanosis Skin: No rashes, no ulcers,no icterus     Data Reviewed: I have personally reviewed following labs and imaging studies  CBC: Recent Labs  Lab 12/10/22 0539 12/11/22 0838 12/12/22 0444 12/13/22 0500 12/14/22 0457 12/16/22 0526  WBC 34.3* 30.1* 22.9* 20.2* 22.9* 21.6*  NEUTROABS 29.1*  --   --   --   --   --   HGB 9.8* 8.9* 8.2* 8.3* 8.1* 8.1*  HCT 32.9* 29.8* 26.3* 27.4* 26.9* 26.8*  MCV 82.0 80.5 80.4 80.1 80.8 81.5  PLT 531* 549* 485* 491* 511* 592*   Basic Metabolic Panel: Recent Labs  Lab 12/10/22 0539 12/11/22 0838 12/12/22 0444 12/13/22 0500 12/16/22 0526  NA 136 134* 135 134* 135  K 3.8 3.3* 3.7 3.9 4.1  CL 105 100 99 99 96*  CO2 24 27 29 31  32  GLUCOSE 89 85 97 91 86  BUN 8 8 8 9 12   CREATININE 0.49 0.53 0.62 0.58 0.55  CALCIUM 8.7* 8.8* 8.6* 8.9 8.8*  MG 1.3* 1.6* 1.8  --   --      Recent Results (from the past 240 hour(s))  Culture, blood (single) w Reflex to ID Panel     Status: None   Collection Time: 12/08/22  6:19 PM   Specimen: BLOOD LEFT ARM  Result Value Ref Range Status   Specimen Description   Final    BLOOD LEFT ARM Performed at [facility], [address],  [place], [place] [address]    Special Requests   Final    BOTTLES DRAWN AEROBIC AND ANAEROBIC Blood Culture results may not be optimal due to an inadequate volume of blood received in culture bottles Performed at [facility], [address] [place], [place] [address]    Culture   Final    NO GROWTH 5 DAYS Performed at [facility], [address], [place], [place] [address]    Report Status 12/13/2022 FINAL  Final     Radiology Studies: No results found.  Scheduled Meds:  acetaminophen  1,000 mg Oral Q12H   ALPRAZolam  0.25 mg Oral QHS   Chlorhexidine Gluconate Cloth  6 each Topical Daily   feeding supplement  1 Container Oral BID BM   furosemide  40 mg Oral Daily   heparin  5,000 Units Subcutaneous Q8H   lidocaine  1 patch Transdermal Q24H   metoprolol succinate  50 mg Oral Daily   mometasone-formoterol  2 puff Inhalation BID   morphine  15 mg Oral Q12H   multivitamin with minerals  1 tablet Oral Daily   nystatin  5 mL Mouth/Throat QID   pantoprazole  40 mg Oral Daily   potassium chloride  20 mEq Oral Daily   primidone  50 mg Oral QHS   sodium chloride flush  10-40 mL Intracatheter Q12H   Continuous Infusions:  lactated ringers 10 mL/hr at 12/08/22 1147     LOS: 10 days   Burnadette Pop, MD [facility] P5/21/2024, 12:37 PM

## 2022-12-17 ENCOUNTER — Ambulatory Visit
Admission: RE | Admit: 2022-12-17 | Discharge: 2022-12-17 | Disposition: A | Discharge status: Home / Self Care | Payer: 59 | Source: Ambulatory Visit | Attending: Radiation Oncology | Admitting: Radiation Oncology

## 2022-12-17 ENCOUNTER — Other Ambulatory Visit: Payer: Self-pay

## 2022-12-17 DIAGNOSIS — Z87891 Personal history of nicotine dependence: Secondary | ICD-10-CM | POA: Diagnosis not present

## 2022-12-17 DIAGNOSIS — C3411 Malignant neoplasm of upper lobe, right bronchus or lung: Secondary | ICD-10-CM | POA: Diagnosis not present

## 2022-12-17 DIAGNOSIS — C3491 Malignant neoplasm of unspecified part of right bronchus or lung: Secondary | ICD-10-CM | POA: Diagnosis not present

## 2022-12-17 DIAGNOSIS — Z510 Encounter for antineoplastic radiation therapy: Secondary | ICD-10-CM | POA: Diagnosis not present

## 2022-12-17 LAB — RAD ONC ARIA SESSION SUMMARY
Course Elapsed Days: 6
Plan Fractions Treated to Date: 5
Plan Prescribed Dose Per Fraction: 3 Gy
Plan Total Fractions Prescribed: 10
Plan Total Prescribed Dose: 30 Gy
Reference Point Dosage Given to Date: 15 Gy
Reference Point Session Dosage Given: 3 Gy
Session Number: 5

## 2022-12-17 MED ORDER — ORAL CARE MOUTH RINSE: 15.0000 mL | OROMUCOSAL | Status: DC | PRN

## 2022-12-17 NOTE — Progress Notes (Signed)
Physical Therapy Treatment Patient Details Name: Courtney Brown MRN: [id number] DOB: 16-Aug-1940 Today's Date: 12/17/2022   History of Present Illness Pt is an 82 year old recently diagnosed stage IV squamous cell lung cancer with metastasis infiltration of the ribs presented to the ER with worsening pain and shortness of breath. PMH: arthritis, melanoma, COPD, emphysema, HTN, MI, OP, PNA, TIA    PT Comments    Pt making slow progress   She continues to fatigue very easily with limited endurance.  She is not ambulating household distances at this time.  Continue to recommend SNF at d/c due to fall risk, does not have 24 hr support, and not at home functional level yet.      Recommendations for follow up therapy are one component of a multi-disciplinary discharge planning process, led by the attending physician.  Recommendations may be updated based on patient status, additional functional criteria and insurance authorization.  Follow Up Recommendations  Can patient physically be transported by private vehicle: Yes    Assistance Recommended at Discharge Intermittent Supervision/Assistance  Patient can return home with the following Help with stairs or ramp for entrance;Assistance with cooking/housework;Assist for transportation;A little help with walking and/or transfers;A little help with bathing/dressing/bathroom   Equipment Recommendations  None recommended by PT    Recommendations for Other Services       Precautions / Restrictions Precautions Precautions: Fall     Mobility  Bed Mobility Overal bed mobility: Needs Assistance Bed Mobility: Supine to Sit     Supine to sit: Min assist, HOB elevated     General bed mobility comments: Pt reaching for assist -provided cues for hand placement to try to push up and scoot forward.    Transfers Overall transfer level: Needs assistance Equipment used: Rolling walker (2 wheels) Transfers: Sit to/from Stand Sit to Stand: Min  guard           General transfer comment: Performed x 4 during session with min cues for hand placement    Ambulation/Gait Ambulation/Gait assistance: Min guard Gait Distance (Feet): 25 Feet (25'x2) Assistive device: Rolling walker (2 wheels) Gait Pattern/deviations: Step-to pattern, Decreased stride length Gait velocity: .15 ft/sec     General Gait Details: fatigued easily , slow speed, min cues for RW proximity; had chair follow; tried to encourage pt to push to increase endurance but reports legs too weak and needing to sit   Stairs             Wheelchair Mobility    Modified Rankin (Stroke Patients Only)       Balance Overall balance assessment: Needs assistance Sitting-balance support: Feet supported Sitting balance-Leahy Scale: Good     Standing balance support: Bilateral upper extremity supported, Reliant on assistive device for balance Standing balance-Leahy Scale: Poor Standing balance comment: Min guard with RW                            Cognition Arousal/Alertness: Awake/alert Behavior During Therapy: WFL for tasks assessed/performed Overall Cognitive Status: Within Functional Limits for tasks assessed                                 General Comments: Orientedx4, able to follow commands consistently pleasant. Very HOH        Exercises      General Comments General comments (skin integrity, edema, etc.): Pt on 3 L O2; unable to get O2  reading - denies shortness of breath      Pertinent Vitals/Pain Pain Assessment Pain Assessment: No/denies pain    Home Living                          Prior Function            PT Goals (current goals can now be found in the care plan section) Progress towards PT goals: Progressing toward goals    Frequency    Min 1X/week      PT Plan Current plan remains appropriate    Co-evaluation              AM-PAC PT "6 Clicks" Mobility   Outcome Measure   Help needed turning from your back to your side while in a flat bed without using bedrails?: A Little Help needed moving from lying on your back to sitting on the side of a flat bed without using bedrails?: A Little Help needed moving to and from a bed to a chair (including a wheelchair)?: A Little Help needed standing up from a chair using your arms (e.g., wheelchair or bedside chair)?: A Little Help needed to walk in hospital room?: A Lot (limited distance and spped) Help needed climbing 3-5 steps with a railing? : A Lot 6 Click Score: 16    End of Session Equipment Utilized During Treatment: Gait belt Activity Tolerance: Patient tolerated treatment well Patient left: with chair alarm set;in chair;with call bell/phone within reach Nurse Communication: Mobility status PT Visit Diagnosis: Difficulty in walking, not elsewhere classified (R26.2);Muscle weakness (generalized) (M62.81)     Time: 7829-5621 PT Time Calculation (min) (ACUTE ONLY): 27 min  Charges:  $Gait Training: 8-22 mins $Therapeutic Activity: 8-22 mins                     Anise Salvo, PT Acute Rehab [facility] [phone]    Rayetta Humphrey 12/17/2022, 4:19 PM

## 2022-12-17 NOTE — Progress Notes (Signed)
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  On presentation she was hemodynamically stable.  CT angiogram of the chest /abdomen/pelvis showed necrotic right upper lung mass with increased size from recent exam with bony destruction of the third and fourth rib on the right side, multiple pulmonary lesions, metastatic lesion on the left lung.  She was admitted for intractable pain from cancer center.  Hospital course also remarkable for severe epigastric pain, found to have pancreatitis. She remains very weak, deconditioned . PT evaluation requested.  Family requesting for SNF consideration. She is medically stable for discharge to SNF as soon as bed is available,TOC following  Assessment & Plan:   Principal Problem:   Stage IV squamous cell carcinoma of right lung (HCC) Active Problems:   Acute respiratory failure with hypoxia (HCC)   Cancer related pain   COPD GOLD II   HTN (hypertension), benign   DNR (do not resuscitate)/DNI(Do Not Intubate)   Asymptomatic bacteriuria   Cancer associated pain   Malignant neoplasm of upper lobe of right lung (HCC)   High risk medication use   Palliative care encounter   Counseling and coordination of care   Medication management   Malnutrition of moderate degree   Stage IV lung cancer/cancer related pain:  Continue pain medications as ordered.  Currently hemodynamically stable.   Continue MS Contin, oxycodone, Tylenol, lidocaine patches. Follows with oncology as an outpatient.  Port-A-Cath was placed on 5/13.  Continue radiation treatment. Plan for starting chemotherapy on 28th of this month.  Her performance status is very poor.   Not sure  how she can tolerate chemotherapy.   Acute pancreatitis: > Resolved. She complained of severe epigastric pain on 5/13.  CT scan showed acute pancreatitis without complication or necrosis.  Lipase minimally elevated but now has resolved.  IV fluids will be discontinued due to concern of volume overload.  Diet advanced to solid, She is tolerating well.  She denies any significant nausea or abdominal pain today. She complains of diarrhea so MiraLAX and Senokot discontinued.   Leukocytosis:  > Improving  Unclear etiology but most likely secondary to malignancy.  Cultures have been negative so far.  Patient has been empirically started on Zosyn for rule out intra-abdominal infection.  Does not have fever.  Procalcitonin is nonreassuring Continue to monitor, leukocytosis finally improving.  Antibiotics discontinued.   Diastolic CHF:  She has bilateral lower extremity edema.  IV fluids  discontinued.  Had some crackles on the left lung on 5/15. CXR on 5/15 showed large right upper lobe lung mass with adjacent pleural disease or effusion. Last echo showed EF of 55 to 60%, grade 1 diastolic dysfunction. Given a dose of lasix on 5/15, 5/16. Venous Doppler of lower extremity negative for DVT She has persistent edema bilateral extremities.  Likely contributed by low albumin.   She could not tolerate compression stockings.   Continue oral Lasix, potassium supplementation   Hypomagnesemia:  Replaced. Continue to monitor   COPD: Currently without exacerbation.   Continue as needed bronchodilators. She qualified for home oxygen for 2 L   Hypertension: Continue  Toprol.  Monitor blood pressure   Goals of care: Poor prognosis secondary to stage IV lung cancer  with mets.   Palliative care following for goals of care.  CODE STATUS is DNR. Plan for outpatient follow up with palliative care.  Patient, family still planning to continue chemotherapy that has been planned to start on 5/28.   Her performance status  is very poor.  She is very weak. PT recommending home health.   Daughter says  she is unable to take care of her at home and requesting for SNF consideration   Obesity: BMI of 30.5   Nutrition Problem: Moderate Malnutrition Etiology: chronic illness   DVT prophylaxis:Heparin sq Code Status: DNR Family Communication: No family at bed side. Disposition Plan:    Status is: Inpatient Remains inpatient appropriate because:  Admitted for cancer related pain, complicated by acute pancreatitis which is now improved.  Leukocytosis improved.  Patient would be discharged and she will follow-up outpatient for palliative chemotherapy.   Consultants:  Oncology  Procedures: None  Antimicrobials:  Anti-infectives (From admission, onward)    Start     Dose/Rate Route Frequency Ordered Stop   12/09/22 1800  piperacillin-tazobactam (ZOSYN) IVPB 3.375 g  Status:  Discontinued        3.375 g 12.5 mL/hr over 240 Minutes Intravenous Every 8 hours 12/09/22 1448 12/14/22 1129   12/09/22 0800  meropenem (MERREM) 1 g in sodium chloride 0.9 % 100 mL IVPB  Status:  Discontinued        1 g 200 mL/hr over 30 Minutes Intravenous Every 12 hours 12/09/22 0756 12/09/22 1448   12/08/22 1200  ceFAZolin (ANCEF) IVPB 2g/100 mL premix        2 g 200 mL/hr over 30 Minutes Intravenous On call to O.R. 12/08/22 1114 12/08/22 1233   12/05/22 2300  piperacillin-tazobactam (ZOSYN) IVPB 3.375 g  Status:  Discontinued        3.375 g 12.5 mL/hr over 240 Minutes Intravenous Every 8 hours 12/05/22 2238 12/06/22 1356   12/05/22 1700  cefTRIAXone (ROCEPHIN) 1 g in sodium chloride 0.9 % 100 mL IVPB        1 g 200 mL/hr over 30 Minutes Intravenous  Once 12/05/22 1654 12/05/22 2107      Subjective: Patient was seen and examined at bedside.  Overnight events noted.   Patient reports doing better, She still reports having abdominal pain.   She reports has no bowel movement in the last couple of days.  Objective: Vitals:    12/16/22 1926 12/16/22 2018 12/17/22 0508 12/17/22 1320  BP:  (!) 129/47 (!) 125/58 (!) 118/51  Pulse:  95 90 95  Resp:  18 18 18   Temp:    97.7 F (36.5 C)  TempSrc:    Oral  SpO2: 98% 100% 100% 100%  Weight:      Height:        Intake/Output Summary (Last 24 hours) at 12/17/2022 1505 Last data filed at 12/17/2022 1610 Gross per 24 hour  Intake 240 ml  Output --  Net 240 ml   Filed Weights   12/12/22 0500 12/13/22 0500 12/15/22 0500  Weight: 71 kg 74.5 kg 70.5 kg    Examination:  General exam: Appears calm and comfortable, deconditioned, elderly frail. Respiratory system: Clear to auscultation. Respiratory effort normal.  RR 16 Cardiovascular system: S1 & S2 heard, RRR. No JVD, murmurs, rubs, gallops or clicks.  Gastrointestinal system: Abdomen is soft, mildly tender, non distended, BS+ Central nervous system: Alert and oriented x 2. No focal neurological deficits. Extremities: No edema, no cyanosis, no clubbing Skin: No rashes,  lesions or ulcers Psychiatry: Judgement and insight appear normal. Mood & affect appropriate.     Data Reviewed: I have personally reviewed following labs and imaging studies  CBC: Recent Labs  Lab 12/11/22 0838 12/12/22 0444 12/13/22 0500 12/14/22 0457 12/16/22 0526  WBC 30.1* 22.9* 20.2* 22.9* 21.6*  HGB 8.9* 8.2* 8.3* 8.1* 8.1*  HCT 29.8* 26.3* 27.4* 26.9* 26.8*  MCV 80.5 80.4 80.1 80.8 81.5  PLT 549* 485* 491* 511* 592*   Basic Metabolic Panel: Recent Labs  Lab 12/11/22 0838 12/12/22 0444 12/13/22 0500 12/16/22 0526  NA 134* 135 134* 135  K 3.3* 3.7 3.9 4.1  CL 100 99 99 96*  CO2 27 29 31  32  GLUCOSE 85 97 91 86  BUN 8 8 9 12   CREATININE 0.53 0.62 0.58 0.55  CALCIUM 8.8* 8.6* 8.9 8.8*  MG 1.6* 1.8  --   --    GFR: Estimated Creatinine Clearance: 48.3 mL/min (by C-G formula based on SCr of 0.55 mg/dL). Liver Function Tests: Recent Labs  Lab 12/11/22 0838  AST 30  ALT 16  ALKPHOS 98  BILITOT 0.7  PROT 4.8*   ALBUMIN <1.5*   No results for input(s): "LIPASE", "AMYLASE" in the last 168 hours. No results for input(s): "AMMONIA" in the last 168 hours. Coagulation Profile: No results for input(s): "INR", "PROTIME" in the last 168 hours. Cardiac Enzymes: No results for input(s): "CKTOTAL", "CKMB", "CKMBINDEX", "TROPONINI" in the last 168 hours. BNP (last 3 results) No results for input(s): "PROBNP" in the last 8760 hours. HbA1C: No results for input(s): "HGBA1C" in the last 72 hours. CBG: No results for input(s): "GLUCAP" in the last 168 hours. Lipid Profile: No results for input(s): "CHOL", "HDL", "LDLCALC", "TRIG", "CHOLHDL", "LDLDIRECT" in the last 72 hours. Thyroid Function Tests: No results for input(s): "TSH", "T4TOTAL", "FREET4", "T3FREE", "THYROIDAB" in the last 72 hours. Anemia Panel: No results for input(s): "VITAMINB12", "FOLATE", "FERRITIN", "TIBC", "IRON", "RETICCTPCT" in the last 72 hours. Sepsis Labs: No results for input(s): "PROCALCITON", "LATICACIDVEN" in the last 168 hours.  Recent Results (from the past 240 hour(s))  Culture, blood (single) w Reflex to ID Panel     Status: None   Collection Time: 12/08/22  6:19 PM   Specimen: BLOOD LEFT ARM  Result Value Ref Range Status   Specimen Description   Final    BLOOD LEFT ARM Performed at [facility], [address], [place], [place] [address]    Special Requests   Final    BOTTLES DRAWN AEROBIC AND ANAEROBIC Blood Culture results may not be optimal due to an inadequate volume of blood received in culture bottles Performed at [facility], [address] [place], [place] [address]    Culture   Final    NO GROWTH 5 DAYS Performed at [facility], [address] [place], [place] [address]    Report Status 12/13/2022 FINAL  Final    Radiology Studies: No results found.  Scheduled Meds:  acetaminophen  1,000 mg Oral Q12H   ALPRAZolam  0.25 mg Oral QHS   Chlorhexidine Gluconate Cloth   6 each Topical Daily   feeding supplement  1 Container Oral BID BM   furosemide  40 mg Oral Daily   heparin  5,000 Units Subcutaneous Q8H   lidocaine  1 patch Transdermal Q24H   metoprolol succinate  50 mg Oral Daily   mometasone-formoterol  2 puff Inhalation BID   morphine  15 mg Oral Q12H  multivitamin with minerals  1 tablet Oral Daily   nystatin  5 mL Mouth/Throat QID   pantoprazole  40 mg Oral Daily   potassium chloride  20 mEq Oral Daily   primidone  50 mg Oral QHS   sodium chloride flush  10-40 mL Intracatheter Q12H   Continuous Infusions:  lactated ringers 10 mL/hr at 12/08/22 1147     LOS: 11 days    Time spent: 50 mins    Willeen Niece, MD [facility]   If 7PM-7AM, please contact night-coverage

## 2022-12-17 NOTE — TOC Progression Note (Signed)
Transition of Care [facility]) - Progression Note    Patient Details  Name: Courtney Brown MRN: [id number] Date of Birth: 1941/07/26  Transition [facility] CM/SW Contact  Beckie Busing, RN Phone [phone]  12/17/2022, 3:41 PM  Clinical Narrative:    Daughter Courtney Brown has been updated on [facility] retracting offer. Next choice per daughter would be [facility].  CM has reached out to [facility] for [employer]. There is no answer. Voicemail has been left. Will wait return call.    Expected Discharge Plan: Skilled Nursing Facility Barriers to Discharge: SNF Pending bed offer, Insurance Authorization  Expected Discharge Plan and Services In-house Referral: Clinical Social Work   Post Acute Care Choice: Skilled Nursing Facility Living arrangements for the past 2 months: Single Family Home                                       Social Determinants of Health (SDOH) Interventions SDOH Screenings   Food Insecurity: No Food Insecurity (12/06/2022)  Housing: Low Risk  (12/06/2022)  Transportation Needs: No Transportation Needs (12/06/2022)  Utilities: Not At Risk (12/06/2022)  Depression (PHQ2-9): Low Risk  (12/02/2022)  Financial Resource Strain: Low Risk  (11/01/2022)  Physical Activity: Sufficiently Active (11/01/2022)  Social Connections: Unknown (11/01/2022)  Stress: No Stress Concern Present (11/01/2022)  Tobacco Use: Medium Risk (12/09/2022)    Readmission Risk Interventions     No data to display

## 2022-12-18 ENCOUNTER — Ambulatory Visit
Admission: RE | Admit: 2022-12-18 | Discharge: 2022-12-18 | Disposition: A | Discharge status: Home / Self Care | Payer: 59 | Source: Ambulatory Visit | Attending: Radiation Oncology | Admitting: Radiation Oncology

## 2022-12-18 ENCOUNTER — Inpatient Hospital Stay: Payer: 59

## 2022-12-18 ENCOUNTER — Other Ambulatory Visit: Payer: Self-pay

## 2022-12-18 DIAGNOSIS — Z510 Encounter for antineoplastic radiation therapy: Secondary | ICD-10-CM | POA: Diagnosis not present

## 2022-12-18 DIAGNOSIS — Z87891 Personal history of nicotine dependence: Secondary | ICD-10-CM | POA: Diagnosis not present

## 2022-12-18 DIAGNOSIS — C3491 Malignant neoplasm of unspecified part of right bronchus or lung: Secondary | ICD-10-CM | POA: Diagnosis not present

## 2022-12-18 DIAGNOSIS — C3411 Malignant neoplasm of upper lobe, right bronchus or lung: Secondary | ICD-10-CM | POA: Diagnosis not present

## 2022-12-18 LAB — RAD ONC ARIA SESSION SUMMARY
Course Elapsed Days: 7
Plan Fractions Treated to Date: 6
Plan Prescribed Dose Per Fraction: 3 Gy
Plan Total Fractions Prescribed: 10
Plan Total Prescribed Dose: 30 Gy
Reference Point Dosage Given to Date: 18 Gy
Reference Point Session Dosage Given: 3 Gy
Session Number: 6

## 2022-12-18 LAB — CBC
HCT: 24.6 % — ABNORMAL LOW (ref 36.0–46.0)
Hemoglobin: 7.5 g/dL — ABNORMAL LOW (ref 12.0–15.0)
MCH: 25.1 pg — ABNORMAL LOW (ref 26.0–34.0)
MCHC: 30.5 g/dL (ref 30.0–36.0)
MCV: 82.3 fL (ref 80.0–100.0)
Platelets: 493 10*3/uL — ABNORMAL HIGH (ref 150–400)
RBC: 2.99 MIL/uL — ABNORMAL LOW (ref 3.87–5.11)
RDW: 19.7 % — ABNORMAL HIGH (ref 11.5–15.5)
WBC: 16.8 10*3/uL — ABNORMAL HIGH (ref 4.0–10.5)
nRBC: 0 % (ref 0.0–0.2)

## 2022-12-18 LAB — MAGNESIUM: Magnesium: 1.5 mg/dL — ABNORMAL LOW (ref 1.7–2.4)

## 2022-12-18 LAB — BASIC METABOLIC PANEL
Anion gap: 9 (ref 5–15)
BUN: 12 mg/dL (ref 8–23)
CO2: 30 mmol/L (ref 22–32)
Calcium: 8.5 mg/dL — ABNORMAL LOW (ref 8.9–10.3)
Chloride: 95 mmol/L — ABNORMAL LOW (ref 98–111)
Creatinine, Ser: 0.37 mg/dL — ABNORMAL LOW (ref 0.44–1.00)
GFR, Estimated: >60 mL/min (ref >60)
Glucose, Bld: 90 mg/dL (ref 70–99)
Potassium: 3.8 mmol/L (ref 3.5–5.1)
Sodium: 134 mmol/L — ABNORMAL LOW (ref 135–145)

## 2022-12-18 LAB — PHOSPHORUS: Phosphorus: 3.5 mg/dL (ref 2.5–4.6)

## 2022-12-18 MED ORDER — MAGNESIUM SULFATE 2 GM/50ML IV SOLN
2.0000 g | Freq: Once | INTRAVENOUS | Status: AC
  Administered 2022-12-18: 2 g via INTRAVENOUS
  Filled 2022-12-18: qty 50

## 2022-12-18 NOTE — Progress Notes (Signed)
Spoke with Dr. [name] regarding pt's d/c planning from hospital per request of [facility]) request. Per Dr. [name], he has not seen pt since her recent hospitalization but will see her w/in 1 wk post hospital d/c.  Per Aundra Millet the pt will be d/c to a SNF and pt's daughter will bring pt to appts.  Notified Megan of conversation w/Dr. [name] regarding d/c planning and f/u care.  Pt is scheduled to see Dr. [name] on 12/26/2022 at which time the pt's plan of care will be discussed with pt and daughter.

## 2022-12-18 NOTE — TOC Progression Note (Addendum)
Transition of Care [facility]) - Progression Note    Patient Details  Name: Courtney Brown MRN: [id number] Date of Birth: [birth year]  Transition of Care [facility]) CM/SW Contact  Beckie Busing, RN Phone Number:[phone]  12/18/2022, 1:09 PM  Clinical Narrative:    CM has received a message from Wynona Luna at [facility] stating that facility can accept the patient . CM attempted to return call to confirm but no answer. Message has been left. Will update insurance auth.   [address] Berkley Harvey has been submitted  Auth ID [id number]   Expected Discharge Plan: Skilled Nursing Facility Barriers to Discharge: SNF Pending bed offer, Insurance Authorization  Expected Discharge Plan and Services In-house Referral: Clinical Social Work   Post Acute Care Choice: Skilled Nursing Facility Living arrangements for the past 2 months: Single Family Home                                       Social Determinants of Health (SDOH) Interventions SDOH Screenings   Food Insecurity: No Food Insecurity (12/06/2022)  Housing: Low Risk  (12/06/2022)  Transportation Needs: No Transportation Needs (12/06/2022)  Utilities: Not At Risk (12/06/2022)  Depression (PHQ2-9): Low Risk  (12/02/2022)  Financial Resource Strain: Low Risk  (11/01/2022)  Physical Activity: Sufficiently Active (11/01/2022)  Social Connections: Unknown (11/01/2022)  Stress: No Stress Concern Present (11/01/2022)  Tobacco Use: Medium Risk (12/09/2022)    Readmission Risk Interventions     No data to display

## 2022-12-18 NOTE — Progress Notes (Signed)
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  On presentation she was hemodynamically stable.  CT angiogram of the chest /abdomen/pelvis showed necrotic right upper lung mass with increased size from recent exam with bony destruction of the third and fourth rib on the right side, multiple pulmonary lesions, metastatic lesion on the left lung.  She was admitted for intractable pain from cancer center.  Hospital course also remarkable for severe epigastric pain, found to have pancreatitis. She remains very weak, deconditioned . PT evaluation requested.  Family requesting for SNF consideration. She is medically stable for discharge to SNF as soon as bed is available,TOC following  Assessment & Plan:   Principal Problem:   Stage IV squamous cell carcinoma of right lung (HCC) Active Problems:   Acute respiratory failure with hypoxia (HCC)   Cancer related pain   COPD GOLD II   HTN (hypertension), benign   DNR (do not resuscitate)/DNI(Do Not Intubate)   Asymptomatic bacteriuria   Cancer associated pain   Malignant neoplasm of upper lobe of right lung (HCC)   High risk medication use   Palliative care encounter   Counseling and coordination of care   Medication management   Malnutrition of moderate degree   Stage IV lung cancer/cancer related pain:  Continue pain medications as ordered.  Currently hemodynamically stable.   Continue MS Contin, oxycodone, Tylenol, lidocaine patches. Follows with oncology as an outpatient.  Port-A-Cath was placed on 5/13.  Continue radiation treatment. Plan for starting chemotherapy on 28th of this month.  Her performance status is very poor.   Not sure  how she can tolerate chemotherapy.   Acute pancreatitis: > Resolved. She complained of severe epigastric pain on 5/13.  CT scan showed acute pancreatitis without complication or necrosis.  Lipase minimally elevated but now has resolved.  IV fluids will be discontinued due to concern of volume overload.  Diet advanced to solid, She is tolerating well.  She denies any significant nausea or abdominal pain today. She complains of diarrhea so MiraLAX and Senokot discontinued.   Leukocytosis:  > Improving  Unclear etiology but most likely secondary to malignancy.  Cultures have been negative so far.  Patient has been empirically started on Zosyn for rule out intra-abdominal infection.  Does not have fever.  Procalcitonin is non reassuring,  Continue to monitor, leukocytosis finally improving.  Antibiotics discontinued.   Diastolic CHF:  She has bilateral lower extremity edema.  IV fluids  discontinued.  Had some crackles on the left lung on 5/15.  CXR on 5/15 showed large right upper lobe lung mass with adjacent pleural disease or effusion.  Last echo showed EF of 55 to 60%, grade 1 diastolic dysfunction. Given a dose of lasix on 5/15, 5/16. Venous Doppler of lower extremity negative for DVT She has persistent edema bilateral extremities.  Likely contributed by low albumin.   She could not tolerate compression stockings.   Continue oral Lasix, potassium supplementation   Hypomagnesemia:  Replaced. Continue to monitor   COPD: Currently without exacerbation.   Continue as needed bronchodilators.  She qualified for home oxygen for 2 L   Hypertension: Continue  Toprol.  Monitor blood pressure   Goals of care: Poor prognosis secondary  to stage IV lung cancer with mets.   Palliative care following for goals of care.  CODE STATUS is DNR. Plan for outpatient follow up with palliative care.  Patient, family still planning to continue chemotherapy that has been planned to start on 5/28.   Her  performance status is very poor.  She is very weak. PT recommending home health.   Daughter says  she is unable to take care of her at home and requesting for SNF consideration   Obesity: BMI of 30.5   Nutrition Problem: Moderate Malnutrition Etiology: chronic illness   DVT prophylaxis:Heparin sq Code Status: DNR Family Communication: No family at bed side. Disposition Plan:    Status is: Inpatient Remains inpatient appropriate because:  Admitted for cancer related pain, complicated by acute pancreatitis which is now improved.  Leukocytosis improved.  Patient would be discharged and she will follow-up outpatient for palliative chemotherapy.   Consultants:  Oncology  Procedures: None  Antimicrobials:  Anti-infectives (From admission, onward)    Start     Dose/Rate Route Frequency Ordered Stop   12/09/22 1800  piperacillin-tazobactam (ZOSYN) IVPB 3.375 g  Status:  Discontinued        3.375 g 12.5 mL/hr over 240 Minutes Intravenous Every 8 hours 12/09/22 1448 12/14/22 1129   12/09/22 0800  meropenem (MERREM) 1 g in sodium chloride 0.9 % 100 mL IVPB  Status:  Discontinued        1 g 200 mL/hr over 30 Minutes Intravenous Every 12 hours 12/09/22 0756 12/09/22 1448   12/08/22 1200  ceFAZolin (ANCEF) IVPB 2g/100 mL premix        2 g 200 mL/hr over 30 Minutes Intravenous On call to O.R. 12/08/22 1114 12/08/22 1233   12/05/22 2300  piperacillin-tazobactam (ZOSYN) IVPB 3.375 g  Status:  Discontinued        3.375 g 12.5 mL/hr over 240 Minutes Intravenous Every 8 hours 12/05/22 2238 12/06/22 1356   12/05/22 1700  cefTRIAXone (ROCEPHIN) 1 g in sodium chloride 0.9 % 100 mL IVPB        1 g 200 mL/hr over 30 Minutes Intravenous  Once 12/05/22 1654 12/05/22 2107      Subjective: Patient was seen and examined at bedside.  Overnight events noted.   Patient reports doing better she still reports having mild abdominal pain. She reports had bowel movement yeterday   Objective: Vitals:    12/17/22 1320 12/17/22 2021 12/18/22 0538 12/18/22 0845  BP: (!) 118/51 (!) 126/51 (!) 113/49   Pulse: 95 90 89   Resp: 18 16 16    Temp: 97.7 F (36.5 C)  97.7 F (36.5 C)   TempSrc: Oral  Oral   SpO2: 100% 99% 100% 99%  Weight:      Height:        Intake/Output Summary (Last 24 hours) at 12/18/2022 1316 Last data filed at 12/17/2022 2218 Gross per 24 hour  Intake 130 ml  Output --  Net 130 ml   Filed Weights   12/12/22 0500 12/13/22 0500 12/15/22 0500  Weight: 71 kg 74.5 kg 70.5 kg    Examination:  General exam: Appears comfortable, deconditioned, elderly frail. Not in any distress. Respiratory system: CTA bilaterally. Respiratory effort normal.  RR 14 Cardiovascular system: S1 & S2 heard, RRR. No JVD, murmurs, rubs, gallops or clicks.  Gastrointestinal system: Abdomen is soft, mildly tender, non distended, BS+ Central nervous system: Alert and oriented x 2. No focal neurological deficits. Extremities: No edema, no cyanosis, no clubbing  Skin: No rashes, lesions or ulcers Psychiatry: Judgement and insight appear normal. Mood & affect appropriate.     Data Reviewed: I have personally reviewed following labs and imaging studies  CBC: Recent Labs  Lab 12/12/22 0444 12/13/22 0500 12/14/22 0457 12/16/22 0526 12/18/22 0505  WBC 22.9* 20.2* 22.9* 21.6* 16.8*  HGB 8.2* 8.3* 8.1* 8.1* 7.5*  HCT 26.3* 27.4* 26.9* 26.8* 24.6*  MCV 80.4 80.1 80.8 81.5 82.3  PLT 485* 491* 511* 592* 493*   Basic Metabolic Panel: Recent Labs  Lab 12/12/22 0444 12/13/22 0500 12/16/22 0526 12/18/22 0505  NA 135 134* 135 134*  K 3.7 3.9 4.1 3.8  CL 99 99 96* 95*  CO2 29 31 32 30  GLUCOSE 97 91 86 90  BUN 8 9 12 12   CREATININE 0.62 0.58 0.55 0.37*  CALCIUM 8.6* 8.9 8.8* 8.5*  MG 1.8  --   --  1.5*  PHOS  --   --   --  3.5   GFR: Estimated Creatinine Clearance: 48.3 mL/min (A) (by C-G formula based on SCr of 0.37 mg/dL (L)). Liver Function Tests: No results for input(s): "AST",  "ALT", "ALKPHOS", "BILITOT", "PROT", "ALBUMIN" in the last 168 hours.  No results for input(s): "LIPASE", "AMYLASE" in the last 168 hours. No results for input(s): "AMMONIA" in the last 168 hours. Coagulation Profile: No results for input(s): "INR", "PROTIME" in the last 168 hours. Cardiac Enzymes: No results for input(s): "CKTOTAL", "CKMB", "CKMBINDEX", "TROPONINI" in the last 168 hours. BNP (last 3 results) No results for input(s): "PROBNP" in the last 8760 hours. HbA1C: No results for input(s): "HGBA1C" in the last 72 hours. CBG: No results for input(s): "GLUCAP" in the last 168 hours. Lipid Profile: No results for input(s): "CHOL", "HDL", "LDLCALC", "TRIG", "CHOLHDL", "LDLDIRECT" in the last 72 hours. Thyroid Function Tests: No results for input(s): "TSH", "T4TOTAL", "FREET4", "T3FREE", "THYROIDAB" in the last 72 hours. Anemia Panel: No results for input(s): "VITAMINB12", "FOLATE", "FERRITIN", "TIBC", "IRON", "RETICCTPCT" in the last 72 hours. Sepsis Labs: No results for input(s): "PROCALCITON", "LATICACIDVEN" in the last 168 hours.  Recent Results (from the past 240 hour(s))  Culture, blood (single) w Reflex to ID Panel     Status: None   Collection Time: 12/08/22  6:19 PM   Specimen: BLOOD LEFT ARM  Result Value Ref Range Status   Specimen Description   Final    BLOOD LEFT ARM Performed at [facility], [address], [place], [place] [address]    Special Requests   Final    BOTTLES DRAWN AEROBIC AND ANAEROBIC Blood Culture results may not be optimal due to an inadequate volume of blood received in culture bottles Performed at [facility], [address], [place], [place] [address]    Culture   Final    NO GROWTH 5 DAYS Performed at [facility], [address] [place], [place] [address]    Report Status 12/13/2022 FINAL  Final    Radiology Studies: No results found.  Scheduled Meds:  acetaminophen  1,000 mg Oral Q12H   ALPRAZolam   0.25 mg Oral QHS   Chlorhexidine Gluconate Cloth  6 each Topical Daily   feeding supplement  1 Container Oral BID BM   furosemide  40 mg Oral Daily   heparin  5,000 Units Subcutaneous Q8H   lidocaine  1 patch Transdermal Q24H   metoprolol succinate  50 mg Oral Daily   mometasone-formoterol  2 puff Inhalation BID   morphine  15 mg Oral Q12H   multivitamin with minerals  1 tablet Oral Daily   nystatin  5 mL Mouth/Throat QID   pantoprazole  40 mg Oral Daily   potassium chloride  20 mEq Oral Daily   primidone  50 mg Oral QHS   sodium chloride flush  10-40 mL Intracatheter Q12H   Continuous Infusions:  lactated ringers 10 mL/hr at 12/08/22 1147     LOS: 12 days    Time spent: 35 mins    Willeen Niece, MD [facility]   If 7PM-7AM, please contact night-coverage

## 2022-12-19 ENCOUNTER — Other Ambulatory Visit: Payer: Self-pay

## 2022-12-19 ENCOUNTER — Ambulatory Visit
Admission: RE | Admit: 2022-12-19 | Discharge: 2022-12-19 | Disposition: A | Discharge status: Home / Self Care | Payer: 59 | Source: Ambulatory Visit | Attending: Radiation Oncology | Admitting: Radiation Oncology

## 2022-12-19 DIAGNOSIS — C3411 Malignant neoplasm of upper lobe, right bronchus or lung: Secondary | ICD-10-CM | POA: Diagnosis not present

## 2022-12-19 DIAGNOSIS — C3491 Malignant neoplasm of unspecified part of right bronchus or lung: Secondary | ICD-10-CM | POA: Diagnosis not present

## 2022-12-19 DIAGNOSIS — Z510 Encounter for antineoplastic radiation therapy: Secondary | ICD-10-CM | POA: Diagnosis not present

## 2022-12-19 DIAGNOSIS — Z87891 Personal history of nicotine dependence: Secondary | ICD-10-CM | POA: Diagnosis not present

## 2022-12-19 LAB — RAD ONC ARIA SESSION SUMMARY
Course Elapsed Days: 8
Plan Fractions Treated to Date: 7
Plan Prescribed Dose Per Fraction: 3 Gy
Plan Total Fractions Prescribed: 10
Plan Total Prescribed Dose: 30 Gy
Reference Point Dosage Given to Date: 21 Gy
Reference Point Session Dosage Given: 3 Gy
Session Number: 7

## 2022-12-19 LAB — BASIC METABOLIC PANEL
Anion gap: 9 (ref 5–15)
BUN: 12 mg/dL (ref 8–23)
CO2: 29 mmol/L (ref 22–32)
Calcium: 8.5 mg/dL — ABNORMAL LOW (ref 8.9–10.3)
Chloride: 95 mmol/L — ABNORMAL LOW (ref 98–111)
Creatinine, Ser: 0.46 mg/dL (ref 0.44–1.00)
GFR, Estimated: >60 mL/min (ref >60)
Glucose, Bld: 92 mg/dL (ref 70–99)
Potassium: 3.9 mmol/L (ref 3.5–5.1)
Sodium: 133 mmol/L — ABNORMAL LOW (ref 135–145)

## 2022-12-19 LAB — CBC
HCT: 24.1 % — ABNORMAL LOW (ref 36.0–46.0)
Hemoglobin: 7.3 g/dL — ABNORMAL LOW (ref 12.0–15.0)
MCH: 25 pg — ABNORMAL LOW (ref 26.0–34.0)
MCHC: 30.3 g/dL (ref 30.0–36.0)
MCV: 82.5 fL (ref 80.0–100.0)
Platelets: 473 10*3/uL — ABNORMAL HIGH (ref 150–400)
RBC: 2.92 MIL/uL — ABNORMAL LOW (ref 3.87–5.11)
RDW: 19.8 % — ABNORMAL HIGH (ref 11.5–15.5)
WBC: 19.5 10*3/uL — ABNORMAL HIGH (ref 4.0–10.5)
nRBC: 0 % (ref 0.0–0.2)

## 2022-12-19 LAB — MAGNESIUM: Magnesium: 1.9 mg/dL (ref 1.7–2.4)

## 2022-12-19 LAB — PHOSPHORUS: Phosphorus: 3.6 mg/dL (ref 2.5–4.6)

## 2022-12-19 NOTE — Progress Notes (Signed)
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  On presentation she was hemodynamically stable.  CT angiogram of the chest /abdomen/pelvis showed necrotic right upper lung mass with increased size from recent exam with bony destruction of the third and fourth rib on the right side, multiple pulmonary lesions, metastatic lesion on the left lung.  She was admitted for intractable pain from cancer center.  Hospital course also remarkable for severe epigastric pain, found to have pancreatitis. She remains very weak, deconditioned . PT evaluation requested.  Family requesting for SNF consideration. She is medically stable for discharge to SNF as soon as bed is available,TOC following  Assessment & Plan:   Principal Problem:   Stage IV squamous cell carcinoma of right lung (HCC) Active Problems:   Acute respiratory failure with hypoxia (HCC)   Cancer related pain   COPD GOLD II   HTN (hypertension), benign   DNR (do not resuscitate)/DNI(Do Not Intubate)   Asymptomatic bacteriuria   Cancer associated pain   Malignant neoplasm of upper lobe of right lung (HCC)   High risk medication use   Palliative care encounter   Counseling and coordination of care   Medication management   Malnutrition of moderate degree   Stage IV lung cancer/cancer related pain:  Continue pain medications as ordered.  Currently hemodynamically stable.   Continue MS Contin, oxycodone, Tylenol, lidocaine patches. Follows with oncology as an outpatient.  Port-A-Cath was placed on 5/13.  Continue radiation treatment. Plan is for starting chemotherapy on 28th of this month.  Her performance status is very poor.   Not sure  how she can tolerate chemotherapy.   Acute pancreatitis: > Resolved. She complained of severe epigastric pain on 5/13.  CT scan showed acute pancreatitis without complication or necrosis.  Lipase minimally elevated but now has resolved.  IV fluids will be discontinued due to concern of volume overload.  Diet advanced to solid, She is tolerating well.  She denies any significant nausea or abdominal pain today. She complains of diarrhea so MiraLAX and Senokot discontinued.   Leukocytosis:  > Improving  Unclear etiology but most likely secondary to malignancy.  Cultures have been negative so far.  Patient has been empirically started on Zosyn for rule out intra-abdominal infection.  Does not have fever.  Procalcitonin is non reassuring,  Continue to monitor, leukocytosis finally improving.  Antibiotics discontinued.   Diastolic CHF:  She has bilateral lower extremity edema.  IV fluids  discontinued.  Had some crackles on the left lung on 5/15.  CXR on 5/15 showed large right upper lobe lung mass with adjacent pleural disease or effusion.  Last echo showed EF of 55 to 60%, grade 1 diastolic dysfunction. Given a dose of lasix on 5/15, 5/16. Venous Doppler of lower extremity negative for DVT She has persistent edema bilateral extremities.  Likely contributed by low albumin.   She could not tolerate compression stockings.   Continue oral Lasix, potassium supplementation   Hypomagnesemia:  Replaced. Continue to monitor   COPD: Currently without exacerbation.   Continue as needed bronchodilators.  She qualified for home oxygen for 2 L   Hypertension: Continue  Toprol.  Monitor blood pressure   Goals of care: Poor prognosis  secondary to stage IV lung cancer with mets.   Palliative care following for goals of care.  CODE STATUS is DNR. Plan for outpatient follow up with palliative care.  Patient, family still planning to continue chemotherapy that has been planned to start on 5/28.   Her  performance status is very poor.  She is very weak. PT recommending home health.   Daughter says  she is unable to take care of her at home and requesting for SNF consideration   Obesity: BMI of 30.5   Nutrition Problem: Moderate Malnutrition Etiology: chronic illness   DVT prophylaxis:Heparin sq Code Status: DNR Family Communication: No family at bed side. Disposition Plan:    Status is: Inpatient Remains inpatient appropriate because:  Admitted for cancer related pain, complicated by acute pancreatitis which is now improved.  Leukocytosis improved.  Patient would be discharged and she will follow-up outpatient for palliative chemotherapy.   Consultants:  Oncology  Procedures: None  Antimicrobials:  Anti-infectives (From admission, onward)    Start     Dose/Rate Route Frequency Ordered Stop   12/09/22 1800  piperacillin-tazobactam (ZOSYN) IVPB 3.375 g  Status:  Discontinued        3.375 g 12.5 mL/hr over 240 Minutes Intravenous Every 8 hours 12/09/22 1448 12/14/22 1129   12/09/22 0800  meropenem (MERREM) 1 g in sodium chloride 0.9 % 100 mL IVPB  Status:  Discontinued        1 g 200 mL/hr over 30 Minutes Intravenous Every 12 hours 12/09/22 0756 12/09/22 1448   12/08/22 1200  ceFAZolin (ANCEF) IVPB 2g/100 mL premix        2 g 200 mL/hr over 30 Minutes Intravenous On call to O.R. 12/08/22 1114 12/08/22 1233   12/05/22 2300  piperacillin-tazobactam (ZOSYN) IVPB 3.375 g  Status:  Discontinued        3.375 g 12.5 mL/hr over 240 Minutes Intravenous Every 8 hours 12/05/22 2238 12/06/22 1356   12/05/22 1700  cefTRIAXone (ROCEPHIN) 1 g in sodium chloride 0.9 % 100 mL IVPB        1 g 200 mL/hr over 30 Minutes Intravenous  Once 12/05/22 1654 12/05/22 2107      Subjective: Patient was seen and examined at bedside.  Overnight events noted.   Patient reports doing better.  She  still complains of pain on the right side while breathing.   Objective: Vitals:   12/19/22 0538  12/19/22 0856 12/19/22 1116 12/19/22 1149  BP: (!) 116/51  (!) 113/40 (!) 120/56  Pulse: 85  96   Resp: 14  12   Temp: 97.7 F (36.5 C)  98 F (36.7 C)   TempSrc: Oral  Oral   SpO2: 100% 99% 100%   Weight: 70.9 kg     Height:        Intake/Output Summary (Last 24 hours) at 12/19/2022 1353 Last data filed at 12/18/2022 1426 Gross per 24 hour  Intake 120 ml  Output --  Net 120 ml   Filed Weights   12/13/22 0500 12/15/22 0500 12/19/22 0538  Weight: 74.5 kg 70.5 kg 70.9 kg    Examination:  General exam: Appears comfortable, deconditioned, elderly frail lady, not in any distress. Respiratory system: CTA bilaterally. Respiratory effort normal.  RR 15 Cardiovascular system: S1 & S2 heard, RRR. No JVD, murmurs, rubs, gallops or clicks.  Gastrointestinal system: Abdomen is soft, mildly tender, non distended, BS+ Central nervous system: Alert and oriented x 3. No focal neurological deficits. Extremities: No edema, no  cyanosis, no clubbing Skin: No rashes, lesions or ulcers Psychiatry: Judgement and insight appear normal. Mood & affect appropriate.     Data Reviewed: I have personally reviewed following labs and imaging studies  CBC: Recent Labs  Lab 12/13/22 0500 12/14/22 0457 12/16/22 0526 12/18/22 0505 12/19/22 0555  WBC 20.2* 22.9* 21.6* 16.8* 19.5*  HGB 8.3* 8.1* 8.1* 7.5* 7.3*  HCT 27.4* 26.9* 26.8* 24.6* 24.1*  MCV 80.1 80.8 81.5 82.3 82.5  PLT 491* 511* 592* 493* 473*   Basic Metabolic Panel: Recent Labs  Lab 12/13/22 0500 12/16/22 0526 12/18/22 0505 12/19/22 0555  NA 134* 135 134* 133*  K 3.9 4.1 3.8 3.9  CL 99 96* 95* 95*  CO2 31 32 30 29  GLUCOSE 91 86 90 92  BUN 9 12 12 12   CREATININE 0.58 0.55 0.37* 0.46  CALCIUM 8.9 8.8* 8.5* 8.5*  MG  --   --  1.5* 1.9  PHOS  --   --  3.5 3.6   GFR: Estimated Creatinine Clearance: 48.5 mL/min (by C-G formula based on SCr of 0.46 mg/dL). Liver Function Tests: No results for input(s): "AST", "ALT", "ALKPHOS",  "BILITOT", "PROT", "ALBUMIN" in the last 168 hours.  No results for input(s): "LIPASE", "AMYLASE" in the last 168 hours. No results for input(s): "AMMONIA" in the last 168 hours. Coagulation Profile: No results for input(s): "INR", "PROTIME" in the last 168 hours. Cardiac Enzymes: No results for input(s): "CKTOTAL", "CKMB", "CKMBINDEX", "TROPONINI" in the last 168 hours. BNP (last 3 results) No results for input(s): "PROBNP" in the last 8760 hours. HbA1C: No results for input(s): "HGBA1C" in the last 72 hours. CBG: No results for input(s): "GLUCAP" in the last 168 hours. Lipid Profile: No results for input(s): "CHOL", "HDL", "LDLCALC", "TRIG", "CHOLHDL", "LDLDIRECT" in the last 72 hours. Thyroid Function Tests: No results for input(s): "TSH", "T4TOTAL", "FREET4", "T3FREE", "THYROIDAB" in the last 72 hours. Anemia Panel: No results for input(s): "VITAMINB12", "FOLATE", "FERRITIN", "TIBC", "IRON", "RETICCTPCT" in the last 72 hours. Sepsis Labs: No results for input(s): "PROCALCITON", "LATICACIDVEN" in the last 168 hours.  No results found for this or any previous visit (from the past 240 hour(s)).   Radiology Studies: No results found.  Scheduled Meds:  acetaminophen  1,000 mg Oral Q12H   ALPRAZolam  0.25 mg Oral QHS   Chlorhexidine Gluconate Cloth  6 each Topical Daily   feeding supplement  1 Container Oral BID BM   furosemide  40 mg Oral Daily   heparin  5,000 Units Subcutaneous Q8H   lidocaine  1 patch Transdermal Q24H   metoprolol succinate  50 mg Oral Daily   mometasone-formoterol  2 puff Inhalation BID   morphine  15 mg Oral Q12H   multivitamin with minerals  1 tablet Oral Daily   nystatin  5 mL Mouth/Throat QID   pantoprazole  40 mg Oral Daily   potassium chloride  20 mEq Oral Daily   primidone  50 mg Oral QHS   sodium chloride flush  10-40 mL Intracatheter Q12H   Continuous Infusions:  lactated ringers 10 mL/hr at 12/08/22 1147     LOS: 13 days    Time  spent: 35 mins    Willeen Niece, MD [facility]   If 7PM-7AM, please contact night-coverage

## 2022-12-19 NOTE — Progress Notes (Signed)
Pharmacist Chemotherapy Monitoring - Initial Assessment    Anticipated start date: 12/29/22   The following has been reviewed per standard work regarding the patient's treatment regimen: The patient's diagnosis, treatment plan and drug doses, and organ/hematologic function Lab orders and baseline tests specific to treatment regimen  The treatment plan start date, drug sequencing, and pre-medications Prior authorization status  Patient's documented medication list, including drug-drug interaction screen and prescriptions for anti-emetics and supportive care specific to the treatment regimen The drug concentrations, fluid compatibility, administration routes, and timing of the medications to be used The patient's access for treatment and lifetime cumulative dose history, if applicable  The patient's medication allergies and previous infusion related reactions, if applicable   Changes made to treatment plan:  N/A  Follow up needed:  N/A   Ebony Hail, Pharm.D., CPP 12/19/2022@3 :45 PM

## 2022-12-19 NOTE — TOC Progression Note (Addendum)
Transition of Care [facility]) - Progression Note    Patient Details  Name: Courtney Brown MRN: [id number] Date of Birth: [birth year]  [facility] CM/SW Contact  Beckie Busing, RN Phone Number:[phone]  12/19/2022, 10:27 AM  Clinical Narrative:    Insurance auth still pending Auth ID # [id number]. CM attempted to reach [facility] admissions director at [facility] to determine if bed is available. There is no answer, voicemail left. TOC will continue to follow.   [id number] Message has been left for admissions director to determine is patient can be admitted this weekend. No answer.   Expected Discharge Plan: Skilled Nursing Facility Barriers to Discharge: SNF Pending bed offer, Insurance Authorization  Expected Discharge Plan and Services In-house Referral: Clinical Social Work   Post Acute Care Choice: Skilled Nursing Facility Living arrangements for the past 2 months: Single Family Home                                       Social Determinants of Health (SDOH) Interventions SDOH Screenings   Food Insecurity: No Food Insecurity (12/06/2022)  Housing: Low Risk  (12/06/2022)  Transportation Needs: No Transportation Needs (12/06/2022)  Utilities: Not At Risk (12/06/2022)  Depression (PHQ2-9): Low Risk  (12/02/2022)  Financial Resource Strain: Low Risk  (11/01/2022)  Physical Activity: Sufficiently Active (11/01/2022)  Social Connections: Unknown (11/01/2022)  Stress: No Stress Concern Present (11/01/2022)  Tobacco Use: Medium Risk (12/09/2022)    Readmission Risk Interventions     No data to display

## 2022-12-19 NOTE — Progress Notes (Signed)
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 PMH: arthritis, melanoma, COPD, emphysema, HTN, MI, OP, PNA, TIA    PT Comments    Pt received in bed, Rtx delayed until this afternoon. Pt completed bed mobility with MinA, transfers with CGA from bed and BSC with good technique. Gait training on 3L O2 with support of RW and CGA ~30'. Increased fatigue upon exertion, no significant SOB. SpO2 91% on 3L. Pt positioned to comfort in chair with all needs met. Will continue to progress acutely until d/c to skilled nursing facility.   Recommendations for follow up therapy are one component of a multi-disciplinary discharge planning process, led by the attending physician.  Recommendations may be updated based on patient status, additional functional criteria and insurance authorization.  Follow Up Recommendations  Can patient physically be transported by private vehicle: Yes    Assistance Recommended at Discharge Intermittent Supervision/Assistance  Patient can return home with the following Help with stairs or ramp for entrance;Assistance with cooking/housework;Assist for transportation;A little help with walking and/or transfers;A little help with bathing/dressing/bathroom   Equipment Recommendations  None recommended by PT    Recommendations for Other Services       Precautions / Restrictions Precautions Precautions: Fall Precaution Comments: monitor sats Restrictions Weight Bearing Restrictions: No     Mobility  Bed Mobility Overal bed mobility: Needs Assistance Bed Mobility: Supine to Sit     Supine to sit: Min assist Sit to supine: Min assist (for LE's)        Transfers Overall transfer level:  Needs assistance Equipment used: Rolling walker (2 wheels) Transfers: Sit to/from Stand Sit to Stand: Min guard           General transfer comment:  (Steady upon initial standing)    Ambulation/Gait Ambulation/Gait assistance: Min guard Gait Distance (Feet): 30 Feet Assistive device: Rolling walker (2 wheels) Gait Pattern/deviations: Step-to pattern, Decreased stride length       General Gait Details: fatigued easily , slow speed, min cues for RW proximity; Remained on 3L O2 SpO2 at 91%   Stairs             Wheelchair Mobility    Modified Rankin (Stroke Patients Only)       Balance Overall balance assessment: Needs assistance Sitting-balance support: Feet supported Sitting balance-Leahy Scale: Good Sitting balance - Comments: static sitting-good. dynamic sitting-fair+   Standing balance support: Bilateral upper extremity supported, Reliant on assistive device for balance Standing balance-Leahy Scale: Fair Standing balance comment: Min guard with RW                            Cognition Arousal/Alertness: Awake/alert Behavior During Therapy: WFL for tasks assessed/performed Overall Cognitive Status: Within Functional Limits for tasks assessed                                 General Comments: Orientedx4, able to follow commands consistently pleasant. Very [facility]        Exercises General Exercises - Lower Extremity Ankle Circles/Pumps: AROM, Both, 15 reps, Supine Long Arc Quad: AROM, Both, 10 reps, Seated Hip ABduction/ADduction: AROM, Both, 10 reps Other Exercises Other  Exercises: Education provided for pacing and PLB technique    General Comments        Pertinent Vitals/Pain Pain Assessment Pain Assessment: No/denies pain    Home Living                          Prior Function            PT Goals (current goals can now be found in the care plan section) Progress towards PT goals: Progressing toward  goals    Frequency    Min 1X/week      PT Plan Current plan remains appropriate    Co-evaluation              AM-PAC PT "6 Clicks" Mobility   Outcome Measure  Help needed turning from your back to your side while in a flat bed without using bedrails?: A Little Help needed moving from lying on your back to sitting on the side of a flat bed without using bedrails?: A Little Help needed moving to and from a bed to a chair (including a wheelchair)?: A Little Help needed standing up from a chair using your arms (e.g., wheelchair or bedside chair)?: A Little Help needed to walk in hospital room?: A Lot Help needed climbing 3-5 steps with a railing? : A Lot 6 Click Score: 16    End of Session Equipment Utilized During Treatment: Gait belt Activity Tolerance: Patient tolerated treatment well Patient left: with chair alarm set;in chair;with call bell/phone within reach Nurse Communication: Mobility status PT Visit Diagnosis: Difficulty in walking, not elsewhere classified (R26.2);Muscle weakness (generalized) (M62.81)     Time: 1310-1340 PT Time Calculation (min) (ACUTE ONLY): 30 min  Charges:  $Gait Training: 8-22 mins $Therapeutic Exercise: 8-22 mins                    Zadie Cleverly, PTA  [name] 12/19/2022, 2:03 PM

## 2022-12-20 DIAGNOSIS — C3491 Malignant neoplasm of unspecified part of right bronchus or lung: Secondary | ICD-10-CM | POA: Diagnosis not present

## 2022-12-20 LAB — PHOSPHORUS: Phosphorus: 3.7 mg/dL (ref 2.5–4.6)

## 2022-12-20 LAB — BASIC METABOLIC PANEL
Anion gap: 10 (ref 5–15)
BUN: 12 mg/dL (ref 8–23)
CO2: 28 mmol/L (ref 22–32)
Calcium: 8.5 mg/dL — ABNORMAL LOW (ref 8.9–10.3)
Chloride: 96 mmol/L — ABNORMAL LOW (ref 98–111)
Creatinine, Ser: 0.55 mg/dL (ref 0.44–1.00)
GFR, Estimated: >60 mL/min (ref >60)
Glucose, Bld: 98 mg/dL (ref 70–99)
Potassium: 3.8 mmol/L (ref 3.5–5.1)
Sodium: 134 mmol/L — ABNORMAL LOW (ref 135–145)

## 2022-12-20 LAB — CBC
HCT: 26.9 % — ABNORMAL LOW (ref 36.0–46.0)
Hemoglobin: 8 g/dL — ABNORMAL LOW (ref 12.0–15.0)
MCH: 24.6 pg — ABNORMAL LOW (ref 26.0–34.0)
MCHC: 29.7 g/dL — ABNORMAL LOW (ref 30.0–36.0)
MCV: 82.8 fL (ref 80.0–100.0)
Platelets: 522 10*3/uL — ABNORMAL HIGH (ref 150–400)
RBC: 3.25 MIL/uL — ABNORMAL LOW (ref 3.87–5.11)
RDW: 19.9 % — ABNORMAL HIGH (ref 11.5–15.5)
WBC: 19.9 10*3/uL — ABNORMAL HIGH (ref 4.0–10.5)
nRBC: 0 % (ref 0.0–0.2)

## 2022-12-20 LAB — MAGNESIUM: Magnesium: 1.6 mg/dL — ABNORMAL LOW (ref 1.7–2.4)

## 2022-12-20 MED ORDER — IPRATROPIUM-ALBUTEROL 0.5-2.5 (3) MG/3ML IN SOLN
3.0000 mL | Freq: Two times a day (BID) | RESPIRATORY_TRACT | Status: DC
  Administered 2022-12-20 – 2022-12-24 (×9): 3 mL via RESPIRATORY_TRACT
  Filled 2022-12-20 (×9): qty 3

## 2022-12-20 MED ORDER — MAGNESIUM SULFATE 2 GM/50ML IV SOLN
2.0000 g | Freq: Once | INTRAVENOUS | Status: AC
  Administered 2022-12-20: 2 g via INTRAVENOUS
  Filled 2022-12-20: qty 50

## 2022-12-20 NOTE — Progress Notes (Signed)
PROGRESS NOTE    Courtney Brown  UJW:[id number] DOB: 23-Feb-1941 DOA: 12/05/2022 PCP: [name], MD   Brief Narrative: This 82 year old female with PMH significant for COPD, hypertension, recently diagnosed 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  On presentation she was hemodynamically stable.  CT angiogram of the chest /abdomen/pelvis showed necrotic right upper lung mass with increased size from recent exam with bony destruction of the third and fourth rib on the right side, multiple pulmonary lesions, metastatic lesion on the left lung.  She was admitted for intractable pain from cancer center.  Hospital course also remarkable for severe epigastric pain, found to have pancreatitis. She remains very weak, deconditioned . PT evaluation requested.  Family requesting for SNF consideration. She is medically stable for discharge to SNF as soon as bed is available,TOC following  Assessment & Plan:   Principal Problem:   Stage IV squamous cell carcinoma of right lung (HCC) Active Problems:   Acute respiratory failure with hypoxia (HCC)   Cancer related pain   COPD GOLD II   HTN (hypertension), benign   DNR (do not resuscitate)/DNI(Do Not Intubate)   Asymptomatic bacteriuria   Cancer associated pain   Malignant neoplasm of upper lobe of right lung (HCC)   High risk medication use   Palliative care encounter   Counseling and coordination of care   Medication management   Malnutrition of moderate degree   Stage IV lung cancer/cancer related pain:  Continue pain medications as ordered.  Currently hemodynamically stable.   Continue MS Contin, oxycodone, Tylenol, and lidocaine patches. Follows with oncology as an outpatient.  Port-A-Cath was placed on 5/13.  Continue radiation treatment. Plan is for starting chemotherapy on 28th of this month.  Her performance status is very poor.   Not  sure how she can tolerate chemotherapy.   Acute pancreatitis: > Resolved. She complained of severe epigastric pain on 5/13.  CT scan showed acute pancreatitis without complication or necrosis.  Lipase minimally elevated but now has resolved.  IV fluids will be discontinued due to concern of volume overload.  Diet advanced to solid, She is tolerating well.  She denies any significant nausea or abdominal pain today. She complains of diarrhea so MiraLAX and Senokot discontinued.   Leukocytosis:  > Improving  Unclear etiology but most likely secondary to malignancy.  Cultures have been negative so far.  Patient has been empirically started on Zosyn for rule out intra-abdominal infection.  Does not have fever.  Procalcitonin is non reassuring,  Continue to monitor, leukocytosis finally improving.  Antibiotics discontinued.   Diastolic CHF:  She has bilateral lower extremity edema.  IV fluids  discontinued.  Had some crackles on the left lung on 5/15.  CXR on 5/15 showed large right upper lobe lung mass with adjacent pleural disease or effusion.  Last echo showed EF of 55 to 60%, grade 1 diastolic dysfunction. Given a dose of lasix on 5/15, 5/16. Venous Doppler of lower extremity negative for DVT She has persistent edema bilateral extremities.  Likely contributed by low albumin.   She could not tolerate compression stockings.   Continue oral Lasix, potassium supplementation   Hypomagnesemia:  Replaced. Continue to monitor   COPD: Currently without exacerbation.   Continue as needed bronchodilators.  She qualified for home oxygen for 2 L/min   Hypertension: Continue Toprol.  Monitor blood pressure.   Goals of care: Poor prognosis  secondary to stage IV lung cancer with mets.   Palliative care following for goals of care.  CODE STATUS is DNR. Plan for outpatient follow up with palliative care.   Patient, family still planning to continue chemotherapy that has been planned to start on 5/28.   Her  performance status is very poor.  She is very weak. PT recommending home health.   Daughter says  she is unable to take care of her at home and requesting for SNF consideration.   Obesity: BMI of 30.5   Nutrition Problem: Moderate Malnutrition Etiology: chronic illness   DVT prophylaxis:Heparin sq Code Status: DNR Family Communication: No family at bed side. Disposition Plan:    Status is: Inpatient Remains inpatient appropriate because:  Admitted for cancer related pain, complicated by acute pancreatitis which is now improved.  Leukocytosis improved.   Patient would be discharged and she will follow-up outpatient for palliative chemotherapy.   Consultants:  Oncology  Procedures: None  Antimicrobials:  Anti-infectives (From admission, onward)    Start     Dose/Rate Route Frequency Ordered Stop   12/09/22 1800  piperacillin-tazobactam (ZOSYN) IVPB 3.375 g  Status:  Discontinued        3.375 g 12.5 mL/hr over 240 Minutes Intravenous Every 8 hours 12/09/22 1448 12/14/22 1129   12/09/22 0800  meropenem (MERREM) 1 g in sodium chloride 0.9 % 100 mL IVPB  Status:  Discontinued        1 g 200 mL/hr over 30 Minutes Intravenous Every 12 hours 12/09/22 0756 12/09/22 1448   12/08/22 1200  ceFAZolin (ANCEF) IVPB 2g/100 mL premix        2 g 200 mL/hr over 30 Minutes Intravenous On call to O.R. 12/08/22 1114 12/08/22 1233   12/05/22 2300  piperacillin-tazobactam (ZOSYN) IVPB 3.375 g  Status:  Discontinued        3.375 g 12.5 mL/hr over 240 Minutes Intravenous Every 8 hours 12/05/22 2238 12/06/22 1356   12/05/22 1700  cefTRIAXone (ROCEPHIN) 1 g in sodium chloride 0.9 % 100 mL IVPB        1 g 200 mL/hr over 30 Minutes Intravenous  Once 12/05/22 1654 12/05/22 2107      Subjective: Patient was seen and examined at bedside.  Overnight events noted.   Patient reports doing better.  She is still complains of pain in the right side of her breathing. She was having  breakfast.  Objective: Vitals:   12/19/22 2055 12/19/22 2139 12/20/22 0458 12/20/22 0804  BP:   129/62   Pulse: (!) 102  90   Resp:   18   Temp:   97.8 F (36.6 C)   TempSrc:   Oral   SpO2:  98% 100% 100%  Weight:      Height:        Intake/Output Summary (Last 24 hours) at 12/20/2022 1218 Last data filed at 12/20/2022 8119 Gross per 24 hour  Intake 10 ml  Output --  Net 10 ml   Filed Weights   12/13/22 0500 12/15/22 0500 12/19/22 0538  Weight: 74.5 kg 70.5 kg 70.9 kg    Examination:  General exam: Appears comfortable, deconditioned, elderly frail lady, not in any distress. Respiratory system: CTA bilaterally. Respiratory effort normal.  RR 14 Cardiovascular system: S1 & S2 heard, RRR. No JVD, murmurs, rubs, gallops or clicks.  Gastrointestinal system: Abdomen is soft, mildly tender, non distended, BS+ Central nervous system: Alert and oriented x 3. No focal neurological deficits. Extremities: No edema, no  cyanosis, no clubbing Skin: No rashes, lesions or ulcers Psychiatry: Judgement and insight appear normal. Mood & affect appropriate.     Data Reviewed: I have personally reviewed following labs and imaging studies  CBC: Recent Labs  Lab 12/14/22 0457 12/16/22 0526 12/18/22 0505 12/19/22 0555 12/20/22 0611  WBC 22.9* 21.6* 16.8* 19.5* 19.9*  HGB 8.1* 8.1* 7.5* 7.3* 8.0*  HCT 26.9* 26.8* 24.6* 24.1* 26.9*  MCV 80.8 81.5 82.3 82.5 82.8  PLT 511* 592* 493* 473* 522*   Basic Metabolic Panel: Recent Labs  Lab 12/16/22 0526 12/18/22 0505 12/19/22 0555 12/20/22 0611  NA 135 134* 133* 134*  K 4.1 3.8 3.9 3.8  CL 96* 95* 95* 96*  CO2 32 30 29 28   GLUCOSE 86 90 92 98  BUN 12 12 12 12   CREATININE 0.55 0.37* 0.46 0.55  CALCIUM 8.8* 8.5* 8.5* 8.5*  MG  --  1.5* 1.9 1.6*  PHOS  --  3.5 3.6 3.7   GFR: Estimated Creatinine Clearance: 48.5 mL/min (by C-G formula based on SCr of 0.55 mg/dL). Liver Function Tests: No results for input(s): "AST", "ALT",  "ALKPHOS", "BILITOT", "PROT", "ALBUMIN" in the last 168 hours.  No results for input(s): "LIPASE", "AMYLASE" in the last 168 hours. No results for input(s): "AMMONIA" in the last 168 hours. Coagulation Profile: No results for input(s): "INR", "PROTIME" in the last 168 hours. Cardiac Enzymes: No results for input(s): "CKTOTAL", "CKMB", "CKMBINDEX", "TROPONINI" in the last 168 hours. BNP (last 3 results) No results for input(s): "PROBNP" in the last 8760 hours. HbA1C: No results for input(s): "HGBA1C" in the last 72 hours. CBG: No results for input(s): "GLUCAP" in the last 168 hours. Lipid Profile: No results for input(s): "CHOL", "HDL", "LDLCALC", "TRIG", "CHOLHDL", "LDLDIRECT" in the last 72 hours. Thyroid Function Tests: No results for input(s): "TSH", "T4TOTAL", "FREET4", "T3FREE", "THYROIDAB" in the last 72 hours. Anemia Panel: No results for input(s): "VITAMINB12", "FOLATE", "FERRITIN", "TIBC", "IRON", "RETICCTPCT" in the last 72 hours. Sepsis Labs: No results for input(s): "PROCALCITON", "LATICACIDVEN" in the last 168 hours.  No results found for this or any previous visit (from the past 240 hour(s)).   Radiology Studies: No results found.  Scheduled Meds:  acetaminophen  1,000 mg Oral Q12H   ALPRAZolam  0.25 mg Oral QHS   Chlorhexidine Gluconate Cloth  6 each Topical Daily   feeding supplement  1 Container Oral BID BM   furosemide  40 mg Oral Daily   heparin  5,000 Units Subcutaneous Q8H   ipratropium-albuterol  3 mL Nebulization BID   lidocaine  1 patch Transdermal Q24H   metoprolol succinate  50 mg Oral Daily   mometasone-formoterol  2 puff Inhalation BID   morphine  15 mg Oral Q12H   multivitamin with minerals  1 tablet Oral Daily   nystatin  5 mL Mouth/Throat QID   pantoprazole  40 mg Oral Daily   potassium chloride  20 mEq Oral Daily   primidone  50 mg Oral QHS   sodium chloride flush  10-40 mL Intracatheter Q12H   Continuous Infusions:  lactated ringers 10  mL/hr at 12/08/22 1147     LOS: 14 days    Time spent: 35 mins    Willeen Niece, MD [facility]   If 7PM-7AM, please contact night-coverage

## 2022-12-21 DIAGNOSIS — C3491 Malignant neoplasm of unspecified part of right bronchus or lung: Secondary | ICD-10-CM | POA: Diagnosis not present

## 2022-12-21 LAB — MAGNESIUM: Magnesium: 1.9 mg/dL (ref 1.7–2.4)

## 2022-12-21 LAB — BASIC METABOLIC PANEL
Anion gap: 12 (ref 5–15)
BUN: 12 mg/dL (ref 8–23)
CO2: 26 mmol/L (ref 22–32)
Calcium: 8.5 mg/dL — ABNORMAL LOW (ref 8.9–10.3)
Chloride: 94 mmol/L — ABNORMAL LOW (ref 98–111)
Creatinine, Ser: 0.58 mg/dL (ref 0.44–1.00)
GFR, Estimated: >60 mL/min (ref >60)
Glucose, Bld: 93 mg/dL (ref 70–99)
Potassium: 3.6 mmol/L (ref 3.5–5.1)
Sodium: 132 mmol/L — ABNORMAL LOW (ref 135–145)

## 2022-12-21 LAB — CBC
HCT: 24.3 % — ABNORMAL LOW (ref 36.0–46.0)
Hemoglobin: 7.4 g/dL — ABNORMAL LOW (ref 12.0–15.0)
MCH: 25 pg — ABNORMAL LOW (ref 26.0–34.0)
MCHC: 30.5 g/dL (ref 30.0–36.0)
MCV: 82.1 fL (ref 80.0–100.0)
Platelets: 523 10*3/uL — ABNORMAL HIGH (ref 150–400)
RBC: 2.96 MIL/uL — ABNORMAL LOW (ref 3.87–5.11)
RDW: 19.9 % — ABNORMAL HIGH (ref 11.5–15.5)
WBC: 18.7 10*3/uL — ABNORMAL HIGH (ref 4.0–10.5)
nRBC: 0 % (ref 0.0–0.2)

## 2022-12-21 LAB — PHOSPHORUS: Phosphorus: 4.1 mg/dL (ref 2.5–4.6)

## 2022-12-21 NOTE — Progress Notes (Signed)
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  On presentation she was hemodynamically stable.  CT angiogram of the chest /abdomen/pelvis showed necrotic right upper lung mass with increased size from recent exam with bony destruction of the third and fourth rib on the right side, multiple pulmonary lesions, metastatic lesion on the left lung.  She was admitted for intractable pain from cancer center.  Hospital course also remarkable for severe epigastric pain, found to have pancreatitis. She remains very weak, deconditioned . PT evaluation requested.  Family requesting for SNF consideration. She is medically stable for discharge to SNF as soon as bed is available,TOC following  Assessment & Plan:   Principal Problem:   Stage IV squamous cell carcinoma of right lung (HCC) Active Problems:   Acute respiratory failure with hypoxia (HCC)   Cancer related pain   COPD GOLD II   HTN (hypertension), benign   DNR (do not resuscitate)/DNI(Do Not Intubate)   Asymptomatic bacteriuria   Cancer associated pain   Malignant neoplasm of upper lobe of right lung (HCC)   High risk medication use   Palliative care encounter   Counseling and coordination of care   Medication management   Malnutrition of moderate degree  Stage IV lung cancer / Cancer related pain:  Continue pain medications as ordered.  Currently hemodynamically stable.   Continue MS Contin, oxycodone, Tylenol, and lidocaine patches. Follows with oncology as an outpatient.  Port-A-Cath was placed on 5/13.  Continue radiation treatment. Plan is for starting chemotherapy on 28th of this month.  Her performance status is very poor.   Not  sure how she can tolerate chemotherapy.   Acute pancreatitis: > Resolved. She complained of severe epigastric pain on 5/13.  CT scan showed acute pancreatitis without complication or necrosis.  Lipase minimally elevated but now has resolved.  IV fluids will be discontinued due to concern of volume overload.  Diet advanced to solid, She is tolerating well.  She denies any significant nausea or abdominal pain today. She complains of diarrhea so MiraLAX and Senokot discontinued.   Leukocytosis:  > Improving  Unclear etiology but most likely secondary to malignancy.  Cultures have been negative so far.  Patient has been empirically started on Zosyn for rule out intra-abdominal infection.  Does not have fever.  Procalcitonin is non reassuring,  Continue to monitor, leukocytosis finally improving.  Antibiotics discontinued.   Diastolic CHF:  She has bilateral lower extremity edema.  IV fluids  discontinued.  Had some crackles on the left lung on 5/15.  CXR on 5/15 showed large right upper lobe lung mass with adjacent pleural disease or effusion.  Last echo showed EF of 55 to 60%, grade 1 diastolic dysfunction. Given a dose of lasix on 5/15, 5/16. Venous Doppler of lower extremity negative for DVT She has persistent edema bilateral extremities.  Likely contributed by low albumin.   She could not tolerate compression stockings.   Continue oral Lasix, potassium supplementation   Hypomagnesemia:  Replaced. Continue to monitor   COPD: Currently without exacerbation.   Continue as needed bronchodilators.  She qualified for home oxygen for 2 L/min   Hypertension: Continue Toprol.  Monitor blood pressure.   Goals of care: Poor  prognosis secondary to stage IV lung cancer with mets.   Palliative care following for goals of care.  CODE STATUS is DNR. Plan for outpatient follow up with palliative care.   Patient, family still planning to continue chemotherapy that has been planned to start on 5/28.   Her  performance status is very poor.  She is very weak. PT recommending home health.   Daughter says  she is unable to take care of her at home and requesting for SNF consideration.   Obesity: BMI of 30.5   Nutrition Problem: Moderate Malnutrition Etiology: chronic illness   DVT prophylaxis:Heparin sq Code Status: DNR Family Communication: No family at bed side. Disposition Plan:    Status is: Inpatient Remains inpatient appropriate because:  Admitted for cancer related pain, complicated by acute pancreatitis which is now improved.  Leukocytosis improved.   Patient would be discharged and she will follow-up outpatient for palliative chemotherapy.   Consultants:  Oncology  Procedures: None  Antimicrobials:  Anti-infectives (From admission, onward)    Start     Dose/Rate Route Frequency Ordered Stop   12/09/22 1800  piperacillin-tazobactam (ZOSYN) IVPB 3.375 g  Status:  Discontinued        3.375 g 12.5 mL/hr over 240 Minutes Intravenous Every 8 hours 12/09/22 1448 12/14/22 1129   12/09/22 0800  meropenem (MERREM) 1 g in sodium chloride 0.9 % 100 mL IVPB  Status:  Discontinued        1 g 200 mL/hr over 30 Minutes Intravenous Every 12 hours 12/09/22 0756 12/09/22 1448   12/08/22 1200  ceFAZolin (ANCEF) IVPB 2g/100 mL premix        2 g 200 mL/hr over 30 Minutes Intravenous On call to O.R. 12/08/22 1114 12/08/22 1233   12/05/22 2300  piperacillin-tazobactam (ZOSYN) IVPB 3.375 g  Status:  Discontinued        3.375 g 12.5 mL/hr over 240 Minutes Intravenous Every 8 hours 12/05/22 2238 12/06/22 1356   12/05/22 1700  cefTRIAXone (ROCEPHIN) 1 g in sodium chloride 0.9 % 100 mL IVPB        1 g 200 mL/hr over 30 Minutes Intravenous  Once 12/05/22 1654 12/05/22 2107      Subjective: Patient was seen and examined at bedside.  Overnight events noted.   Patient reports doing better. She still complains of pain in the right side of her chest because of deep breathing.  Objective: Vitals:    12/20/22 1804 12/20/22 2116 12/21/22 0431 12/21/22 0754  BP:  129/60 (!) 143/50   Pulse:  (!) 106 96   Resp:  18 16   Temp:  97.6 F (36.4 C) 97.7 F (36.5 C)   TempSrc:  Oral Oral   SpO2: 97% 99% 100% 100%  Weight:   72.1 kg   Height:        Intake/Output Summary (Last 24 hours) at 12/21/2022 1221 Last data filed at 12/21/2022 1610 Gross per 24 hour  Intake 380 ml  Output --  Net 380 ml   Filed Weights   12/15/22 0500 12/19/22 0538 12/21/22 0431  Weight: 70.5 kg 70.9 kg 72.1 kg    Examination:  General exam: Appears comfortable, deconditioned, elderly frail lady, not in any distress. Respiratory system: CTA bilaterally. Respiratory effort normal.  RR 13. Cardiovascular system: S1 & S2 heard, RRR. No JVD, murmurs, rubs, gallops or clicks.  Gastrointestinal system: Abdomen is soft, mildly tender, non distended, BS+ Central nervous system: Alert and oriented x 3. No focal neurological deficits.  Extremities: No edema, no cyanosis, no clubbing Skin: No rashes, lesions or ulcers Psychiatry: Judgement and insight appear normal. Mood & affect appropriate.     Data Reviewed: I have personally reviewed following labs and imaging studies  CBC: Recent Labs  Lab 12/16/22 0526 12/18/22 0505 12/19/22 0555 12/20/22 0611 12/21/22 0500  WBC 21.6* 16.8* 19.5* 19.9* 18.7*  HGB 8.1* 7.5* 7.3* 8.0* 7.4*  HCT 26.8* 24.6* 24.1* 26.9* 24.3*  MCV 81.5 82.3 82.5 82.8 82.1  PLT 592* 493* 473* 522* 523*   Basic Metabolic Panel: Recent Labs  Lab 12/16/22 0526 12/18/22 0505 12/19/22 0555 12/20/22 0611 12/21/22 0500  NA 135 134* 133* 134* 132*  K 4.1 3.8 3.9 3.8 3.6  CL 96* 95* 95* 96* 94*  CO2 32 30 29 28 26   GLUCOSE 86 90 92 98 93  BUN 12 12 12 12 12   CREATININE 0.55 0.37* 0.46 0.55 0.58  CALCIUM 8.8* 8.5* 8.5* 8.5* 8.5*  MG  --  1.5* 1.9 1.6* 1.9  PHOS  --  3.5 3.6 3.7 4.1   GFR: Estimated Creatinine Clearance: 48.8 mL/min (by C-G formula based on SCr of 0.58  mg/dL). Liver Function Tests: No results for input(s): "AST", "ALT", "ALKPHOS", "BILITOT", "PROT", "ALBUMIN" in the last 168 hours.  No results for input(s): "LIPASE", "AMYLASE" in the last 168 hours. No results for input(s): "AMMONIA" in the last 168 hours. Coagulation Profile: No results for input(s): "INR", "PROTIME" in the last 168 hours. Cardiac Enzymes: No results for input(s): "CKTOTAL", "CKMB", "CKMBINDEX", "TROPONINI" in the last 168 hours. BNP (last 3 results) No results for input(s): "PROBNP" in the last 8760 hours. HbA1C: No results for input(s): "HGBA1C" in the last 72 hours. CBG: No results for input(s): "GLUCAP" in the last 168 hours. Lipid Profile: No results for input(s): "CHOL", "HDL", "LDLCALC", "TRIG", "CHOLHDL", "LDLDIRECT" in the last 72 hours. Thyroid Function Tests: No results for input(s): "TSH", "T4TOTAL", "FREET4", "T3FREE", "THYROIDAB" in the last 72 hours. Anemia Panel: No results for input(s): "VITAMINB12", "FOLATE", "FERRITIN", "TIBC", "IRON", "RETICCTPCT" in the last 72 hours. Sepsis Labs: No results for input(s): "PROCALCITON", "LATICACIDVEN" in the last 168 hours.  No results found for this or any previous visit (from the past 240 hour(s)).   Radiology Studies: No results found.  Scheduled Meds:  acetaminophen  1,000 mg Oral Q12H   ALPRAZolam  0.25 mg Oral QHS   Chlorhexidine Gluconate Cloth  6 each Topical Daily   feeding supplement  1 Container Oral BID BM   furosemide  40 mg Oral Daily   heparin  5,000 Units Subcutaneous Q8H   ipratropium-albuterol  3 mL Nebulization BID   lidocaine  1 patch Transdermal Q24H   metoprolol succinate  50 mg Oral Daily   mometasone-formoterol  2 puff Inhalation BID   morphine  15 mg Oral Q12H   multivitamin with minerals  1 tablet Oral Daily   nystatin  5 mL Mouth/Throat QID   pantoprazole  40 mg Oral Daily   potassium chloride  20 mEq Oral Daily   primidone  50 mg Oral QHS   sodium chloride flush  10-40  mL Intracatheter Q12H   Continuous Infusions:  lactated ringers 10 mL/hr at 12/08/22 1147     LOS: 15 days    Time spent: 35 mins    Willeen Niece, MD [facility]   If 7PM-7AM, please contact night-coverage

## 2022-12-21 NOTE — Progress Notes (Signed)
Occupational Therapy Treatment Patient Details Name: Courtney Brown MRN: [id number] DOB: Jul 28, 1941 Today's Date: 12/21/2022   History of present illness Pt is an 82 year old recently diagnosed stage IV squamous cell lung cancer with metastasis infiltration of the ribs presented to the ER with worsening pain and shortness of breath. PMH: arthritis, melanoma, COPD, emphysema, HTN, MI, OP, PNA, TIA   OT comments  Patient continues to make progress towards goals. Patient was able to engage in therapeutic activity to increase functional activity tolerance with RW. Patient was able to maintain O2 in 96% or higher on 2L/min during session. Nurse made aware. Patient's discharge plan remains appropriate at this time. OT will continue to follow acutely.     Recommendations for follow up therapy are one component of a multi-disciplinary discharge planning process, led by the attending physician.  Recommendations may be updated based on patient status, additional functional criteria and insurance authorization.    Assistance Recommended at Discharge Intermittent Supervision/Assistance  Patient can return home with the following  Assistance with cooking/housework;Assist for transportation;Help with stairs or ramp for entrance;A little help with walking and/or transfers;A little help with bathing/dressing/bathroom   Equipment Recommendations  BSC/3in1       Precautions / Restrictions Precautions Precautions: Fall Precaution Comments: monitor sats Restrictions Weight Bearing Restrictions: No              ADL either performed or assessed with clinical judgement   ADL Overall ADL's : Needs assistance/impaired       Grooming Details (indicate cue type and reason): declined to work on grooming tasks with daughters impending visit. did agree to participate in standing at recliner with RW with marching task for up to 1 min with fatigue in BLE. O2 maintained 98% on 2L/min                 Cognition Arousal/Alertness: Awake/alert Behavior During Therapy: WFL for tasks assessed/performed Overall Cognitive Status: Within Functional Limits for tasks assessed             General Comments: HOH hears better on L side        Exercises Other Exercises Other Exercises: patient participated in sit to stands 5 reps x2 sets to increase functional activity tolerance with minimal SOB noted after each set. patient was able to maintain O2 on 2L/min during task Other Exercises: patient completed transfers from recliner to bed and back with RW with no rest break with min guard with RW with no LOB.            Pertinent Vitals/ Pain       Pain Assessment Pain Assessment: No/denies pain         Frequency  Min 1X/week        Progress Toward Goals  OT Goals(current goals can now be found in the care plan section)  Progress towards OT goals: Progressing toward goals     Plan Discharge plan needs to be updated       AM-PAC OT "6 Clicks" Daily Activity     Outcome Measure   Help from another person eating meals?: None Help from another person taking care of personal grooming?: None Help from another person toileting, which includes using toliet, bedpan, or urinal?: A Little Help from another person bathing (including washing, rinsing, drying)?: A Lot Help from another person to put on and taking off regular upper body clothing?: A Little Help from another person to put on and taking off regular lower body clothing?: A Lot 6  Click Score: 18    End of Session Equipment Utilized During Treatment: Rolling walker (2 wheels);Oxygen;Gait belt  OT Visit Diagnosis: Unsteadiness on feet (R26.81);Muscle weakness (generalized) (M62.81)   Activity Tolerance Patient tolerated treatment well   Patient Left in chair;with call bell/phone within reach;with chair alarm set   Nurse Communication Mobility status;Other (comment) (O2 during session)        Time:  4782-9562 OT Time Calculation (min): 15 min  Charges: OT General Charges $OT Visit: 1 Visit OT Treatments $Therapeutic Activity: 8-22 mins  Rosalio Loud, MS Acute Rehabilitation Department Office# [phone]   Selinda Flavin 12/21/2022, 12:14 PM

## 2022-12-21 NOTE — TOC Progression Note (Signed)
Transition of Care [facility]) - Progression Note    Patient Details  Name: Courtney Brown MRN: [id number] Date of Birth: 18-Oct-1940  [facility]) CM/SW Contact  Larrie Kass, LCSW Phone Number: 12/21/2022, 10:31 AM  Clinical Narrative:    Pt's insurance authorization went to peer to peer.  "We are offering the treating practitioner an option to speak with a Medical Director before final determination. Provider to call: [phone] Option 5. Deadline is: 12/23/22 12:00 PM EDT If no response, MD will render determination."  MD was made aware. TOC to follow.    Expected Discharge Plan: Skilled Nursing Facility Barriers to Discharge: SNF Pending bed offer, Insurance Authorization  Expected Discharge Plan and Services In-house Referral: Clinical Social Work   Post Acute Care Choice: Skilled Nursing Facility Living arrangements for the past 2 months: Single Family Home                                       Social Determinants of Health (SDOH) Interventions SDOH Screenings   Food Insecurity: No Food Insecurity (12/06/2022)  Housing: Low Risk  (12/06/2022)  Transportation Needs: No Transportation Needs (12/06/2022)  Utilities: Not At Risk (12/06/2022)  Depression (PHQ2-9): Low Risk  (12/02/2022)  Financial Resource Strain: Low Risk  (11/01/2022)  Physical Activity: Sufficiently Active (11/01/2022)  Social Connections: Unknown (11/01/2022)  Stress: No Stress Concern Present (11/01/2022)  Tobacco Use: Medium Risk (12/09/2022)    Readmission Risk Interventions     No data to display

## 2022-12-22 ENCOUNTER — Inpatient Hospital Stay (HOSPITAL_COMMUNITY): Payer: 59

## 2022-12-22 DIAGNOSIS — C3491 Malignant neoplasm of unspecified part of right bronchus or lung: Secondary | ICD-10-CM | POA: Diagnosis not present

## 2022-12-22 LAB — CBC
HCT: 24.3 % — ABNORMAL LOW (ref 36.0–46.0)
Hemoglobin: 7.4 g/dL — ABNORMAL LOW (ref 12.0–15.0)
MCH: 24.9 pg — ABNORMAL LOW (ref 26.0–34.0)
MCHC: 30.5 g/dL (ref 30.0–36.0)
MCV: 81.8 fL (ref 80.0–100.0)
Platelets: 539 10*3/uL — ABNORMAL HIGH (ref 150–400)
RBC: 2.97 MIL/uL — ABNORMAL LOW (ref 3.87–5.11)
RDW: 19.9 % — ABNORMAL HIGH (ref 11.5–15.5)
WBC: 17.6 10*3/uL — ABNORMAL HIGH (ref 4.0–10.5)
nRBC: 0 % (ref 0.0–0.2)

## 2022-12-22 LAB — MAGNESIUM: Magnesium: 1.5 mg/dL — ABNORMAL LOW (ref 1.7–2.4)

## 2022-12-22 LAB — PHOSPHORUS: Phosphorus: 4.4 mg/dL (ref 2.5–4.6)

## 2022-12-22 MED ORDER — MAGNESIUM SULFATE 2 GM/50ML IV SOLN
2.0000 g | Freq: Once | INTRAVENOUS | Status: AC
  Administered 2022-12-22: 2 g via INTRAVENOUS
  Filled 2022-12-22: qty 50

## 2022-12-22 NOTE — Progress Notes (Signed)
Mobility Specialist - Progress Note   12/22/22 1452  Mobility  Activity Transferred to/from [facility]  Level of Assistance Standby assist, set-up cues, supervision of patient - no hands on  Assistive Device Front wheel walker  Distance Ambulated (ft) 2 ft  Activity Response Tolerated well  Mobility Referral Yes  $Mobility charge 1 Mobility   Pt received in recliner requesting assistance to [facility]. No complaints during transfer. Pt to recliner after session with all needs met.    [facility]

## 2022-12-22 NOTE — Progress Notes (Signed)
Nutrition Follow-up  DOCUMENTATION CODES:   Non-severe (moderate) malnutrition in context of chronic illness  INTERVENTION:   -Boost Breeze po BID, each supplement provides 250 kcal and 9 grams of protein   -Multivitamin with minerals daily  NUTRITION DIAGNOSIS:   Moderate Malnutrition related to chronic illness as evidenced by mild fat depletion, mild muscle depletion.  Ongoing.  GOAL:   Patient will meet greater than or equal to 90% of their needs  Progressing.  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  Patient currently consuming 90-100% of meals.  Accepting Boost Breeze. Per MD note, pt is medically stable for discharge. Awaiting SNF bed and insurance authorization.  Admission weight: 144 lbs Current weight: 157 lbs  Medications: Lasix, Multivitamin with minerals daily, KLOR-CON, IV Mg sulfate   Labs reviewed: Low Na Low Mg   Diet Order:   Diet Order             Diet regular Room service appropriate? Yes; Fluid consistency: Thin  Diet effective now                   EDUCATION NEEDS:   Education needs have been addressed  Skin:  Skin Assessment: Skin Integrity Issues: Skin Integrity Issues:: DTI DTI: buttocks Incisions: R chest Other: Bilateral sacral wound  Last BM:  5/24  Height:   Ht Readings from Last 1 Encounters:  12/08/22 5' (1.524 m)    Weight:   Wt Readings from Last 1 Encounters:  12/22/22 71.6 kg    BMI:  Body mass index is 30.83 kg/m.  Estimated Nutritional Needs:   Kcal:  1850-2100 kcals  Protein:  80-100 grams  Fluid:  >/= 1.8L  Tilda Franco, MS, RD, LDN Inpatient Clinical Dietitian Contact information available via Amion

## 2022-12-22 NOTE — TOC Progression Note (Signed)
Transition of Care [facility]) - Progression Note    Patient Details  Name: Courtney Brown MRN: [id number] Date of Birth: Oct 11, 1940  [facility]) CM/SW Contact  Princella Ion, [place] Phone Number: 12/22/2022, 11:59 AM  Clinical Narrative:    This CSW was notified by MD that pt's peer to peer was completed and pt is approved for SNF. This CSW outreached to [place] at [facility] to confirm bed. Colin Mulders states pt does have a bed. MD states pt will discharge tomorrow (Tuesday). This CSW provided [place] with Auth approval details. Colin Mulders will provide CSW with room/report information. TOC following.   Expected Discharge Plan: Skilled Nursing Facility Barriers to Discharge: SNF Pending bed offer, Insurance Authorization  Expected Discharge Plan and Services In-house Referral: Clinical Social Work   Post Acute Care Choice: Skilled Nursing Facility Living arrangements for the past 2 months: Single Family Home                                       Social Determinants of Health (SDOH) Interventions SDOH Screenings   Food Insecurity: No Food Insecurity (12/06/2022)  Housing: Low Risk  (12/06/2022)  Transportation Needs: No Transportation Needs (12/06/2022)  Utilities: Not At Risk (12/06/2022)  Depression (PHQ2-9): Low Risk  (12/02/2022)  Financial Resource Strain: Low Risk  (11/01/2022)  Physical Activity: Sufficiently Active (11/01/2022)  Social Connections: Unknown (11/01/2022)  Stress: No Stress Concern Present (11/01/2022)  Tobacco Use: Medium Risk (12/09/2022)    Readmission Risk Interventions     No data to display

## 2022-12-22 NOTE — Progress Notes (Signed)
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  On presentation she was hemodynamically stable.  CT angiogram of the chest /abdomen/pelvis showed necrotic right upper lung mass with increased size from recent exam with bony destruction of the third and fourth rib on the right side, multiple pulmonary lesions, metastatic lesion on the left lung.  She was admitted for intractable pain from cancer center.  Hospital course also remarkable for severe epigastric pain, found to have pancreatitis. She remains very weak, deconditioned . PT evaluation requested.  Family requesting for SNF consideration. She is medically stable for discharge to SNF as soon as bed is available, TOC following.  Assessment & Plan:   Principal Problem:   Stage IV squamous cell carcinoma of right lung (HCC) Active Problems:   Acute respiratory failure with hypoxia (HCC)   Cancer related pain   COPD GOLD II   HTN (hypertension), benign   DNR (do not resuscitate)/DNI(Do Not Intubate)   Asymptomatic bacteriuria   Cancer associated pain   Malignant neoplasm of upper lobe of right lung (HCC)   High risk medication use   Palliative care encounter   Counseling and coordination of care   Medication management   Malnutrition of moderate degree  Stage IV lung cancer / Cancer related pain:  Continue pain medications as ordered.  Currently hemodynamically stable.   Continue MS Contin, oxycodone, Tylenol, and lidocaine patches. Follows with oncology as an outpatient.  Port-A-Cath was placed on 5/13.  Continue radiation treatment. Plan is for starting chemotherapy on 28th of this month.  Her performance status is very poor.    Not sure how she can tolerate chemotherapy.   Acute pancreatitis: > Resolved. She complained of severe epigastric pain on 5/13.  CT scan showed acute pancreatitis without complication or necrosis.  Lipase minimally elevated but now has resolved.  IV fluids will be discontinued due to concern of volume overload.  Diet advanced to solid, She is tolerating well.  She denies any significant nausea or abdominal pain today. She complains of diarrhea so MiraLAX and Senokot discontinued.   Leukocytosis:  > Improving  Unclear etiology but most likely secondary to malignancy.  Cultures have been negative so far.  Patient has been empirically started on Zosyn for rule out intra-abdominal infection.  Does not have fever.  Procalcitonin is non reassuring,  Continue to monitor, leukocytosis finally improving.  Antibiotics discontinued.   Diastolic CHF:  She has bilateral lower extremity edema.  IV fluids  discontinued.  Had some crackles on the left lung on 5/15.  CXR on 5/15 showed large right upper lobe lung mass with adjacent pleural disease or effusion.  Last echo showed EF of 55 to 60%, grade 1 diastolic dysfunction. Given a dose of lasix on 5/15, 5/16. Venous Doppler of lower extremity negative for DVT She has persistent edema bilateral extremities.  Likely contributed by low albumin.   She could not tolerate compression stockings.   Continue oral Lasix, potassium supplementation   Hypomagnesemia:  Replaced. Continue to monitor   COPD: Currently without exacerbation.   Continue as needed bronchodilators.  She qualified for home oxygen for 2 L/min   Hypertension: Continue Toprol.  Monitor blood pressure.   Goals of  care: Poor prognosis secondary to stage IV lung cancer with mets.   Palliative care following for goals of care.  CODE STATUS is DNR. Plan for outpatient follow up with palliative care.   Patient, family still planning to continue chemotherapy that has been planned to start on 5/28.    Her performance status is very poor.  She is very weak. PT recommending home health.   Daughter says She is unable to take care of her at home and requesting for SNF consideration.   Obesity: BMI of 30.5   Nutrition Problem: Moderate Malnutrition Etiology: chronic illness   DVT prophylaxis:Heparin sq Code Status: DNR Family Communication: No family at bed side. Disposition Plan:   Status is: Inpatient Remains inpatient appropriate because:  Admitted for cancer related pain, complicated by acute pancreatitis which is now improved.  Leukocytosis improved.   Patient would be discharged and she will follow-up outpatient for palliative chemotherapy. Peer to peer Completed.  Patient approved for SNF.   Consultants:  Oncology  Procedures: None  Antimicrobials:  Anti-infectives (From admission, onward)    Start     Dose/Rate Route Frequency Ordered Stop   12/09/22 1800  piperacillin-tazobactam (ZOSYN) IVPB 3.375 g  Status:  Discontinued        3.375 g 12.5 mL/hr over 240 Minutes Intravenous Every 8 hours 12/09/22 1448 12/14/22 1129   12/09/22 0800  meropenem (MERREM) 1 g in sodium chloride 0.9 % 100 mL IVPB  Status:  Discontinued        1 g 200 mL/hr over 30 Minutes Intravenous Every 12 hours 12/09/22 0756 12/09/22 1448   12/08/22 1200  ceFAZolin (ANCEF) IVPB 2g/100 mL premix        2 g 200 mL/hr over 30 Minutes Intravenous On call to O.R. 12/08/22 1114 12/08/22 1233   12/05/22 2300  piperacillin-tazobactam (ZOSYN) IVPB 3.375 g  Status:  Discontinued        3.375 g 12.5 mL/hr over 240 Minutes Intravenous Every 8 hours 12/05/22 2238 12/06/22 1356   12/05/22 1700  cefTRIAXone (ROCEPHIN) 1 g in sodium chloride 0.9 % 100 mL IVPB        1 g 200 mL/hr over 30 Minutes Intravenous  Once 12/05/22 1654 12/05/22 2107      Subjective: Patient was seen and examined at bedside.  Overnight events noted.   Patient reports doing better , still complains of cough with congestion. She  reports slight-pinkish tinge to the phlegm.  Objective: Vitals:   12/21/22 1900 12/21/22 2059 12/22/22 0513 12/22/22 0755  BP:  (!) 119/43 (!) 115/50   Pulse:   88   Resp:  16 14   Temp:  98.9 F (37.2 C) 98.9 F (37.2 C)   TempSrc:  Oral Oral   SpO2: 100% 94% 97% 95%  Weight:   71.6 kg   Height:        Intake/Output Summary (Last 24 hours) at 12/22/2022 1355 Last data filed at 12/22/2022 0900 Gross per 24 hour  Intake 360 ml  Output --  Net 360 ml   Filed Weights   12/19/22 0538 12/21/22 0431 12/22/22 0513  Weight: 70.9 kg 72.1 kg 71.6 kg    Examination:  General exam: Appears comfortable, deconditioned, elderly frail lady, not in any distress. Respiratory system: CTA bilaterally. Respiratory effort normal.  RR 12. Cardiovascular system: S1 & S2 heard, RRR. No JVD, murmurs, rubs, gallops or clicks.  Gastrointestinal system: Abdomen is soft, mildly tender, nondistended, BS+ Central nervous system: Alert and oriented  x 3. No focal neurological deficits. Extremities: No edema, no cyanosis, no clubbing Skin: No rashes, lesions or ulcers Psychiatry: Judgement and insight appear normal. Mood & affect appropriate.     Data Reviewed: I have personally reviewed following labs and imaging studies  CBC: Recent Labs  Lab 12/18/22 0505 12/19/22 0555 12/20/22 0611 12/21/22 0500 12/22/22 0532  WBC 16.8* 19.5* 19.9* 18.7* 17.6*  HGB 7.5* 7.3* 8.0* 7.4* 7.4*  HCT 24.6* 24.1* 26.9* 24.3* 24.3*  MCV 82.3 82.5 82.8 82.1 81.8  PLT 493* 473* 522* 523* 539*   Basic Metabolic Panel: Recent Labs  Lab 12/16/22 0526 12/18/22 0505 12/19/22 0555 12/20/22 0611 12/21/22 0500 12/22/22 0532  NA 135 134* 133* 134* 132*  --   K 4.1 3.8 3.9 3.8 3.6  --   CL 96* 95* 95* 96* 94*  --   CO2 32 30 29 28 26   --   GLUCOSE 86 90 92 98 93  --   BUN 12 12 12 12 12   --   CREATININE 0.55 0.37* 0.46 0.55 0.58  --   CALCIUM 8.8* 8.5* 8.5* 8.5* 8.5*  --   MG  --  1.5* 1.9 1.6* 1.9 1.5*  PHOS   --  3.5 3.6 3.7 4.1 4.4   GFR: Estimated Creatinine Clearance: 48.7 mL/min (by C-G formula based on SCr of 0.58 mg/dL). Liver Function Tests: No results for input(s): "AST", "ALT", "ALKPHOS", "BILITOT", "PROT", "ALBUMIN" in the last 168 hours.  No results for input(s): "LIPASE", "AMYLASE" in the last 168 hours. No results for input(s): "AMMONIA" in the last 168 hours. Coagulation Profile: No results for input(s): "INR", "PROTIME" in the last 168 hours. Cardiac Enzymes: No results for input(s): "CKTOTAL", "CKMB", "CKMBINDEX", "TROPONINI" in the last 168 hours. BNP (last 3 results) No results for input(s): "PROBNP" in the last 8760 hours. HbA1C: No results for input(s): "HGBA1C" in the last 72 hours. CBG: No results for input(s): "GLUCAP" in the last 168 hours. Lipid Profile: No results for input(s): "CHOL", "HDL", "LDLCALC", "TRIG", "CHOLHDL", "LDLDIRECT" in the last 72 hours. Thyroid Function Tests: No results for input(s): "TSH", "T4TOTAL", "FREET4", "T3FREE", "THYROIDAB" in the last 72 hours. Anemia Panel: No results for input(s): "VITAMINB12", "FOLATE", "FERRITIN", "TIBC", "IRON", "RETICCTPCT" in the last 72 hours. Sepsis Labs: No results for input(s): "PROCALCITON", "LATICACIDVEN" in the last 168 hours.  No results found for this or any previous visit (from the past 240 hour(s)).   Radiology Studies: No results found.  Scheduled Meds:  acetaminophen  1,000 mg Oral Q12H   ALPRAZolam  0.25 mg Oral QHS   Chlorhexidine Gluconate Cloth  6 each Topical Daily   feeding supplement  1 Container Oral BID BM   furosemide  40 mg Oral Daily   heparin  5,000 Units Subcutaneous Q8H   ipratropium-albuterol  3 mL Nebulization BID   lidocaine  1 patch Transdermal Q24H   metoprolol succinate  50 mg Oral Daily   mometasone-formoterol  2 puff Inhalation BID   morphine  15 mg Oral Q12H   multivitamin with minerals  1 tablet Oral Daily   nystatin  5 mL Mouth/Throat QID   pantoprazole  40  mg Oral Daily   potassium chloride  20 mEq Oral Daily   primidone  50 mg Oral QHS   sodium chloride flush  10-40 mL Intracatheter Q12H   Continuous Infusions:  lactated ringers 10 mL/hr at 12/08/22 1147     LOS: 16 days    Time spent: 35 mins  Willeen Niece, MD [facility]   If 7PM-7AM, please contact night-coverage

## 2022-12-22 NOTE — Progress Notes (Signed)
Physical Therapy Treatment Patient Details Name: Courtney Brown MRN: [id number] DOB: Dec 28, 1940 Today's Date: 12/22/2022   History of Present Illness Pt is an 82 year old recently diagnosed stage IV squamous cell lung cancer with metastasis infiltration of the ribs presented to the ER on 12/05/22 with worsening pain and shortness of breath. PMH: arthritis, melanoma, COPD, emphysema, HTN, MI, OP, PNA, TIA    PT Comments    Pt making gradual progress. She was able to increase gait but does need guarding, chair follow, and 3 minute rest breaks.  She is not ambulating household distances at this time , needs chair follow, does not have 24 hr support - continue to recommend post acute rehab.   Pt was able to tolerate therapy on RA.  Pt was on 2 L at arrival with sats 100%. Tried RA at rest and sats 98%. RA for ambulation 94%. Notified RN and left on RA     Recommendations for follow up therapy are one component of a multi-disciplinary discharge planning process, led by the attending physician.  Recommendations may be updated based on patient status, additional functional criteria and insurance authorization.  Follow Up Recommendations  Can patient physically be transported by private vehicle: Yes    Assistance Recommended at Discharge Intermittent Supervision/Assistance  Patient can return home with the following Help with stairs or ramp for entrance;Assistance with cooking/housework;Assist for transportation;A little help with walking and/or transfers;A little help with bathing/dressing/bathroom   Equipment Recommendations  None recommended by PT    Recommendations for Other Services       Precautions / Restrictions Precautions Precautions: Fall Precaution Comments: monitor sats     Mobility  Bed Mobility               General bed mobility comments: In chair at arrival    Transfers Overall transfer level: Needs assistance Equipment used: Rolling walker (2  wheels) Transfers: Sit to/from Stand Sit to Stand: Min guard           General transfer comment: Min guard to stand for safety; cues for hand placement; performed x 3    Ambulation/Gait Ambulation/Gait assistance: Min guard Gait Distance (Feet): 40 Feet (40'x2) Assistive device: Rolling walker (2 wheels) Gait Pattern/deviations: Step-to pattern, Decreased stride length Gait velocity: decreased (but improving)     General Gait Details: Fatigued easily, chair follow, min cues for RW proximity   Stairs             Wheelchair Mobility    Modified Rankin (Stroke Patients Only)       Balance Overall balance assessment: Needs assistance Sitting-balance support: Feet supported Sitting balance-Leahy Scale: Good     Standing balance support: Bilateral upper extremity supported, Reliant on assistive device for balance Standing balance-Leahy Scale: Poor Standing balance comment: Min guard with RW                            Cognition Arousal/Alertness: Awake/alert Behavior During Therapy: WFL for tasks assessed/performed Overall Cognitive Status: Within Functional Limits for tasks assessed                                 General Comments: HOH hears better on L side        Exercises      General Comments General comments (skin integrity, edema, etc.): Pt was on 2 L at arrival with sats 100%.  Tried RA  at rest and sats 98%.  RA for ambulation 94%.  Notified RN and left n RA.  Educated pt if feels SOB to call for nurse      Pertinent Vitals/Pain Pain Assessment Pain Assessment: No/denies pain    Home Living                          Prior Function            PT Goals (current goals can now be found in the care plan section) Progress towards PT goals: Progressing toward goals    Frequency    Min 1X/week      PT Plan Current plan remains appropriate    Co-evaluation              AM-PAC PT "6 Clicks"  Mobility   Outcome Measure  Help needed turning from your back to your side while in a flat bed without using bedrails?: A Little Help needed moving from lying on your back to sitting on the side of a flat bed without using bedrails?: A Little Help needed moving to and from a bed to a chair (including a wheelchair)?: A Little Help needed standing up from a chair using your arms (e.g., wheelchair or bedside chair)?: A Little Help needed to walk in hospital room?: A Little Help needed climbing 3-5 steps with a railing? : A Lot 6 Click Score: 17    End of Session Equipment Utilized During Treatment: Gait belt Activity Tolerance: Patient tolerated treatment well Patient left: with chair alarm set;in chair;with call bell/phone within reach Nurse Communication: Mobility status PT Visit Diagnosis: Difficulty in walking, not elsewhere classified (R26.2);Muscle weakness (generalized) (M62.81)     Time: 1610-9604 PT Time Calculation (min) (ACUTE ONLY): 22 min  Charges:  $Gait Training: 8-22 mins                     Anise Salvo, PT Acute Rehab [facility] [phone]    Rayetta Humphrey 12/22/2022, 3:51 PM

## 2022-12-23 ENCOUNTER — Ambulatory Visit
Admission: RE | Admit: 2022-12-23 | Discharge: 2022-12-23 | Disposition: A | Discharge status: Home / Self Care | Payer: 59 | Source: Ambulatory Visit | Attending: Radiation Oncology | Admitting: Radiation Oncology

## 2022-12-23 ENCOUNTER — Other Ambulatory Visit: Payer: Self-pay

## 2022-12-23 DIAGNOSIS — Z510 Encounter for antineoplastic radiation therapy: Secondary | ICD-10-CM | POA: Diagnosis not present

## 2022-12-23 DIAGNOSIS — C3491 Malignant neoplasm of unspecified part of right bronchus or lung: Secondary | ICD-10-CM | POA: Diagnosis not present

## 2022-12-23 DIAGNOSIS — Z87891 Personal history of nicotine dependence: Secondary | ICD-10-CM | POA: Diagnosis not present

## 2022-12-23 DIAGNOSIS — C3411 Malignant neoplasm of upper lobe, right bronchus or lung: Secondary | ICD-10-CM | POA: Diagnosis not present

## 2022-12-23 LAB — RAD ONC ARIA SESSION SUMMARY
Course Elapsed Days: 12
Plan Fractions Treated to Date: 8
Plan Prescribed Dose Per Fraction: 3 Gy
Plan Total Fractions Prescribed: 10
Plan Total Prescribed Dose: 30 Gy
Reference Point Dosage Given to Date: 24 Gy
Reference Point Session Dosage Given: 3 Gy
Session Number: 8

## 2022-12-23 LAB — CBC
HCT: 24 % — ABNORMAL LOW (ref 36.0–46.0)
Hemoglobin: 7.2 g/dL — ABNORMAL LOW (ref 12.0–15.0)
MCH: 24.7 pg — ABNORMAL LOW (ref 26.0–34.0)
MCHC: 30 g/dL (ref 30.0–36.0)
MCV: 82.5 fL (ref 80.0–100.0)
Platelets: 554 10*3/uL — ABNORMAL HIGH (ref 150–400)
RBC: 2.91 MIL/uL — ABNORMAL LOW (ref 3.87–5.11)
RDW: 20 % — ABNORMAL HIGH (ref 11.5–15.5)
WBC: 18.3 10*3/uL — ABNORMAL HIGH (ref 4.0–10.5)
nRBC: 0 % (ref 0.0–0.2)

## 2022-12-23 LAB — BASIC METABOLIC PANEL
Anion gap: 10 (ref 5–15)
BUN: 12 mg/dL (ref 8–23)
CO2: 25 mmol/L (ref 22–32)
Calcium: 8 mg/dL — ABNORMAL LOW (ref 8.9–10.3)
Chloride: 98 mmol/L (ref 98–111)
Creatinine, Ser: 0.4 mg/dL — ABNORMAL LOW (ref 0.44–1.00)
GFR, Estimated: >60 mL/min (ref >60)
Glucose, Bld: 91 mg/dL (ref 70–99)
Potassium: 3.8 mmol/L (ref 3.5–5.1)
Sodium: 133 mmol/L — ABNORMAL LOW (ref 135–145)

## 2022-12-23 LAB — BPAM RBC: ISSUE DATE / TIME: 202405281647

## 2022-12-23 LAB — TYPE AND SCREEN

## 2022-12-23 LAB — PREPARE RBC (CROSSMATCH)

## 2022-12-23 LAB — GLUCOSE, CAPILLARY: Glucose-Capillary: 119 mg/dL — ABNORMAL HIGH (ref 70–99)

## 2022-12-23 LAB — PHOSPHORUS: Phosphorus: 4.6 mg/dL (ref 2.5–4.6)

## 2022-12-23 LAB — ABO/RH: ABO/RH(D): O POS

## 2022-12-23 LAB — MAGNESIUM: Magnesium: 2 mg/dL (ref 1.7–2.4)

## 2022-12-23 MED ORDER — SODIUM CHLORIDE 0.9% IV SOLUTION: Freq: Once | INTRAVENOUS | Status: AC

## 2022-12-23 NOTE — Progress Notes (Signed)
This NN returns the pt's daughter's phone call at this time. According to the VM left by [facility], pts dtr, a nurse came into the pt's room this morning and told her she couldn't get anymore medication due to her insurance. Pt told Pamelia Hoit and both the pt and Pamelia Hoit were confused by this and weren't sure what the nurse meant.  This NN suggested [place] call the charge nurse of the unit which the pt is currently staying to get more information and to call the pts insurance company as well.  This NN requested Pamelia Hoit call back to follow up after she has spoken to the charge nurse and the insurance company.

## 2022-12-23 NOTE — Progress Notes (Signed)
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  On presentation she was hemodynamically stable.  CT angiogram of the chest /abdomen/pelvis showed necrotic right upper lung mass with increased size from recent exam with bony destruction of the third and fourth rib on the right side, multiple pulmonary lesions, metastatic lesion on the left lung.  She was admitted for intractable pain from cancer center.  Hospital course also remarkable for severe epigastric pain, found to have pancreatitis. She remains very weak, deconditioned . PT evaluation requested.  Family requesting for SNF consideration. She is medically stable for discharge to SNF as soon as bed is available, TOC following.  Assessment & Plan:   Principal Problem:   Stage IV squamous cell carcinoma of right lung (HCC) Active Problems:   Acute respiratory failure with hypoxia (HCC)   Cancer related pain   COPD GOLD II   HTN (hypertension), benign   DNR (do not resuscitate)/DNI(Do Not Intubate)   Asymptomatic bacteriuria   Cancer associated pain   Malignant neoplasm of upper lobe of right lung (HCC)   High risk medication use   Palliative care encounter   Counseling and coordination of care   Medication management   Malnutrition of moderate degree  Stage IV lung cancer / Cancer related pain:  Continue pain medications as ordered.  Currently hemodynamically stable.   Continue MS Contin, oxycodone, Tylenol, and lidocaine patches. Follows with oncology as an outpatient.  Port-A-Cath was placed on 5/13.  Continue radiation treatment. Plan is for starting chemotherapy on 28th of this month.  Her performance status is very poor.    Not sure how she can tolerate chemotherapy.   Acute pancreatitis: > Resolved. She complained of severe epigastric pain on 5/13.  CT scan showed acute pancreatitis without complication or necrosis.  Lipase minimally elevated but now has resolved.  IV fluids will be discontinued due to concern of volume overload.  Diet advanced to solid, She is tolerating well.  She denies any significant nausea or abdominal pain today. She complains of diarrhea so MiraLAX and Senokot discontinued.   Leukocytosis:  > Improving  Unclear etiology but most likely secondary to malignancy.  Cultures have been negative so far.  Patient has been empirically started on Zosyn for rule out intra-abdominal infection.  Does not have fever.  Procalcitonin is non reassuring,  Continue to monitor, leukocytosis finally improving.  Antibiotics discontinued.   Diastolic CHF:  She has bilateral lower extremity edema.  IV fluids  discontinued.  Had some crackles on the left lung on 5/15.  CXR on 5/15 showed large right upper lobe lung mass with adjacent pleural disease or effusion.  Last echo showed EF of 55 to 60%, grade 1 diastolic dysfunction. Given a dose of lasix on 5/15, 5/16. Venous Doppler of lower extremity negative for DVT She has persistent edema bilateral extremities.  Likely contributed by low albumin.   She could not tolerate compression stockings.   Continue oral Lasix, potassium supplementation   Hypomagnesemia:  Replaced. Continue to monitor   COPD: Currently without exacerbation.   Continue as needed bronchodilators.  She qualified for home oxygen for 2 L/min   Hypertension: Continue Toprol.  Monitor blood pressure.   Goals of  care: Poor prognosis secondary to stage IV lung cancer with mets.   Palliative care following for goals of care.  CODE STATUS is DNR. Plan for outpatient follow up with palliative care.   Patient, family still planning to continue chemotherapy that has been planned to start on 5/28.    Her performance status is very poor.  She is very weak. PT recommending home health.   Daughter says She is unable to take care of her at home and requesting for SNF consideration.   Obesity: BMI of 30.5   Nutrition Problem: Moderate Malnutrition Etiology: chronic illness.  Normochromic normocytic anemia.: H&H remains between 8 and 9.  There is no any obvious visible bleeding. Hemoglobin 7.2.  Transfuse 1 unit PRBC. Follow posttransfusion CBC.   DVT prophylaxis:Heparin sq Code Status: DNR Family Communication: No family at bed side. Disposition Plan:   Status is: Inpatient Remains inpatient appropriate because:  Admitted for cancer related pain, complicated by acute pancreatitis which is now improved.  Leukocytosis improved.   Patient would be discharged and she will follow-up outpatient for palliative chemotherapy. Peer to peer Completed.  Patient approved for SNF.  Anticipated  discharge to SNF on  12/24/2022.   Consultants:  Oncology  Procedures: None  Antimicrobials:  Anti-infectives (From admission, onward)    Start     Dose/Rate Route Frequency Ordered Stop   12/09/22 1800  piperacillin-tazobactam (ZOSYN) IVPB 3.375 g  Status:  Discontinued        3.375 g 12.5 mL/hr over 240 Minutes Intravenous Every 8 hours 12/09/22 1448 12/14/22 1129   12/09/22 0800  meropenem (MERREM) 1 g in sodium chloride 0.9 % 100 mL IVPB  Status:  Discontinued        1 g 200 mL/hr over 30 Minutes Intravenous Every 12 hours 12/09/22 0756 12/09/22 1448   12/08/22 1200  ceFAZolin (ANCEF) IVPB 2g/100 mL premix        2 g 200 mL/hr over 30 Minutes Intravenous On call to O.R. 12/08/22 1114 12/08/22 1233   12/05/22 2300  piperacillin-tazobactam (ZOSYN) IVPB 3.375 g  Status:  Discontinued        3.375 g 12.5 mL/hr over 240 Minutes Intravenous Every 8 hours 12/05/22 2238 12/06/22 1356   12/05/22 1700  cefTRIAXone (ROCEPHIN) 1 g in sodium chloride 0.9 % 100 mL IVPB        1 g 200 mL/hr over 30  Minutes Intravenous  Once 12/05/22 1654 12/05/22 2107      Subjective: Patient was seen and examined at bedside.  Overnight events noted.   Patient reports doing better.  Still complains of cough with congestion. She reports slight-pinkish tinge to the phlegm, now resolved.  Objective: Vitals:   12/22/22 2103 12/23/22 0602 12/23/22 0737 12/23/22 1114  BP: (!) 133/56 (!) 127/52  (!) 141/88  Pulse:  95  (!) 133  Resp: 16 16    Temp: 97.9 F (36.6 C) 97.7 F (36.5 C)    TempSrc: Oral Oral    SpO2: 98% 94% 95%   Weight:      Height:        Intake/Output Summary (Last 24 hours) at 12/23/2022 1356 Last data filed at 12/23/2022 0900 Gross per 24 hour  Intake 117 ml  Output --  Net 117 ml   Filed Weights   12/19/22 0538 12/21/22 0431 12/22/22 0513  Weight: 70.9 kg 72.1 kg 71.6 kg    Examination:  General exam: Elderly frail lady, very deconditioned but comfortable,  not in  any distress. Respiratory system: CTA bilaterally. Respiratory effort normal.  RR 12. Cardiovascular system: S1 & S2 heard, RRR. No JVD, murmurs, rubs, gallops or clicks.  Gastrointestinal system: Abdomen is soft, mildly tender, nondistended, BS+ Central nervous system: Alert and oriented x 3. No focal neurological deficits. Extremities: No edema, no cyanosis, no clubbing Skin: No rashes, lesions or ulcers Psychiatry: Judgement and insight appear normal. Mood & affect appropriate.     Data Reviewed: I have personally reviewed following labs and imaging studies  CBC: Recent Labs  Lab 12/19/22 0555 12/20/22 0611 12/21/22 0500 12/22/22 0532 12/23/22 0529  WBC 19.5* 19.9* 18.7* 17.6* 18.3*  HGB 7.3* 8.0* 7.4* 7.4* 7.2*  HCT 24.1* 26.9* 24.3* 24.3* 24.0*  MCV 82.5 82.8 82.1 81.8 82.5  PLT 473* 522* 523* 539* 554*   Basic Metabolic Panel: Recent Labs  Lab 12/18/22 0505 12/19/22 0555 12/20/22 0611 12/21/22 0500 12/22/22 0532 12/23/22 0529  NA 134* 133* 134* 132*  --  133*  K 3.8 3.9 3.8 3.6   --  3.8  CL 95* 95* 96* 94*  --  98  CO2 30 29 28 26   --  25  GLUCOSE 90 92 98 93  --  91  BUN 12 12 12 12   --  12  CREATININE 0.37* 0.46 0.55 0.58  --  0.40*  CALCIUM 8.5* 8.5* 8.5* 8.5*  --  8.0*  MG 1.5* 1.9 1.6* 1.9 1.5* 2.0  PHOS 3.5 3.6 3.7 4.1 4.4 4.6   GFR: Estimated Creatinine Clearance: 48.7 mL/min (A) (by C-G formula based on SCr of 0.4 mg/dL (L)). Liver Function Tests: No results for input(s): "AST", "ALT", "ALKPHOS", "BILITOT", "PROT", "ALBUMIN" in the last 168 hours.  No results for input(s): "LIPASE", "AMYLASE" in the last 168 hours. No results for input(s): "AMMONIA" in the last 168 hours. Coagulation Profile: No results for input(s): "INR", "PROTIME" in the last 168 hours. Cardiac Enzymes: No results for input(s): "CKTOTAL", "CKMB", "CKMBINDEX", "TROPONINI" in the last 168 hours. BNP (last 3 results) No results for input(s): "PROBNP" in the last 8760 hours. HbA1C: No results for input(s): "HGBA1C" in the last 72 hours. CBG: No results for input(s): "GLUCAP" in the last 168 hours. Lipid Profile: No results for input(s): "CHOL", "HDL", "LDLCALC", "TRIG", "CHOLHDL", "LDLDIRECT" in the last 72 hours. Thyroid Function Tests: No results for input(s): "TSH", "T4TOTAL", "FREET4", "T3FREE", "THYROIDAB" in the last 72 hours. Anemia Panel: No results for input(s): "VITAMINB12", "FOLATE", "FERRITIN", "TIBC", "IRON", "RETICCTPCT" in the last 72 hours. Sepsis Labs: No results for input(s): "PROCALCITON", "LATICACIDVEN" in the last 168 hours.  No results found for this or any previous visit (from the past 240 hour(s)).   Radiology Studies: DG CHEST PORT 1 VIEW  Result Date: 12/22/2022 CLINICAL DATA:  Cough. EXAM: PORTABLE CHEST 1 VIEW COMPARISON:  Dec 10, 2022 FINDINGS: Similar in appearance large opacity in the right upper lobe. Moderate right pleural effusion. The left lung is clear. Mildly enlarged cardiac silhouette. Calcific atherosclerotic disease and tortuosity of  the aorta. Injectable port in stable position. IMPRESSION: 1. Similar in appearance large opacity in the right upper lobe. 2. Moderate right pleural effusion. Electronically Signed   By: Ted Mcalpine M.D.   On: 12/22/2022 20:15    Scheduled Meds:  sodium chloride   Intravenous Once   acetaminophen  1,000 mg Oral Q12H   ALPRAZolam  0.25 mg Oral QHS   Chlorhexidine Gluconate Cloth  6 each Topical Daily   feeding supplement  1 Container Oral BID  BM   furosemide  40 mg Oral Daily   heparin  5,000 Units Subcutaneous Q8H   ipratropium-albuterol  3 mL Nebulization BID   lidocaine  1 patch Transdermal Q24H   metoprolol succinate  50 mg Oral Daily   mometasone-formoterol  2 puff Inhalation BID   morphine  15 mg Oral Q12H   multivitamin with minerals  1 tablet Oral Daily   nystatin  5 mL Mouth/Throat QID   pantoprazole  40 mg Oral Daily   potassium chloride  20 mEq Oral Daily   primidone  50 mg Oral QHS   sodium chloride flush  10-40 mL Intracatheter Q12H   Continuous Infusions:  lactated ringers 10 mL/hr at 12/08/22 1147     LOS: 17 days    Time spent: 35 mins    Willeen Niece, MD [facility]   If 7PM-7AM, please contact night-coverage

## 2022-12-23 NOTE — TOC Progression Note (Addendum)
Transition of Care [facility]) - Progression Note    Patient Details  Name: Courtney Brown MRN: [id number] Date of Birth: 05-20-1941  Transition of Care [facility]) CM/SW Contact  Beckie Busing, RN Phone Number:[phone]  12/23/2022, 11:21 AM  Clinical Narrative:    Insurance [employer] auth ID [id number] 5/23-5/28. Per MD patient will not be ready fdor d/c until tomorrow 12/24/22. CM has sent message in [employer] portal to request extension on auth or determine if CM will need to start a new auth. CM to follow up.   1354 Request is being reviewed. CM will follow.    Expected Discharge Plan: Skilled Nursing Facility Barriers to Discharge: SNF Pending bed offer, Insurance Authorization  Expected Discharge Plan and Services In-house Referral: Clinical Social Work   Post Acute Care Choice: Skilled Nursing Facility Living arrangements for the past 2 months: Single Family Home                                       Social Determinants of Health (SDOH) Interventions SDOH Screenings   Food Insecurity: No Food Insecurity (12/06/2022)  Housing: Low Risk  (12/06/2022)  Transportation Needs: No Transportation Needs (12/06/2022)  Utilities: Not At Risk (12/06/2022)  Depression (PHQ2-9): Low Risk  (12/02/2022)  Financial Resource Strain: Low Risk  (11/01/2022)  Physical Activity: Sufficiently Active (11/01/2022)  Social Connections: Unknown (11/01/2022)  Stress: No Stress Concern Present (11/01/2022)  Tobacco Use: Medium Risk (12/09/2022)    Readmission Risk Interventions     No data to display

## 2022-12-23 NOTE — Progress Notes (Signed)
No charge note Chart Patient noted to be sitting in a chair does not appear to be in distress Medication history reviewed TOC note reviewed Likely for SNF discharge soon. Recommend continued follow-up with palliative services in the outpatient setting No new PMT specific recommendations at this time No charge Rosalin Hawking, MD [facility] health palliative

## 2022-12-24 ENCOUNTER — Ambulatory Visit
Admission: RE | Admit: 2022-12-24 | Discharge: 2022-12-24 | Disposition: A | Discharge status: Home / Self Care | Payer: 59 | Source: Ambulatory Visit | Attending: Radiation Oncology | Admitting: Radiation Oncology

## 2022-12-24 ENCOUNTER — Ambulatory Visit: Payer: 59 | Admitting: Family Medicine

## 2022-12-24 ENCOUNTER — Inpatient Hospital Stay (HOSPITAL_COMMUNITY): Payer: 59

## 2022-12-24 ENCOUNTER — Other Ambulatory Visit: Payer: Self-pay

## 2022-12-24 DIAGNOSIS — D649 Anemia, unspecified: Secondary | ICD-10-CM | POA: Diagnosis not present

## 2022-12-24 DIAGNOSIS — G893 Neoplasm related pain (acute) (chronic): Secondary | ICD-10-CM | POA: Diagnosis not present

## 2022-12-24 DIAGNOSIS — R0902 Hypoxemia: Secondary | ICD-10-CM | POA: Diagnosis not present

## 2022-12-24 DIAGNOSIS — J441 Chronic obstructive pulmonary disease with (acute) exacerbation: Secondary | ICD-10-CM | POA: Diagnosis not present

## 2022-12-24 DIAGNOSIS — Z7982 Long term (current) use of aspirin: Secondary | ICD-10-CM | POA: Diagnosis not present

## 2022-12-24 DIAGNOSIS — I5020 Unspecified systolic (congestive) heart failure: Secondary | ICD-10-CM | POA: Diagnosis not present

## 2022-12-24 DIAGNOSIS — J9612 Chronic respiratory failure with hypercapnia: Secondary | ICD-10-CM | POA: Diagnosis not present

## 2022-12-24 DIAGNOSIS — D72829 Elevated white blood cell count, unspecified: Secondary | ICD-10-CM | POA: Diagnosis not present

## 2022-12-24 DIAGNOSIS — I10 Essential (primary) hypertension: Secondary | ICD-10-CM | POA: Diagnosis not present

## 2022-12-24 DIAGNOSIS — Z85820 Personal history of malignant melanoma of skin: Secondary | ICD-10-CM | POA: Diagnosis not present

## 2022-12-24 DIAGNOSIS — J449 Chronic obstructive pulmonary disease, unspecified: Secondary | ICD-10-CM | POA: Diagnosis not present

## 2022-12-24 DIAGNOSIS — K861 Other chronic pancreatitis: Secondary | ICD-10-CM | POA: Diagnosis not present

## 2022-12-24 DIAGNOSIS — Z87891 Personal history of nicotine dependence: Secondary | ICD-10-CM | POA: Diagnosis not present

## 2022-12-24 DIAGNOSIS — Z7401 Bed confinement status: Secondary | ICD-10-CM | POA: Diagnosis not present

## 2022-12-24 DIAGNOSIS — C3491 Malignant neoplasm of unspecified part of right bronchus or lung: Secondary | ICD-10-CM | POA: Diagnosis not present

## 2022-12-24 DIAGNOSIS — I5032 Chronic diastolic (congestive) heart failure: Secondary | ICD-10-CM | POA: Diagnosis not present

## 2022-12-24 DIAGNOSIS — R0600 Dyspnea, unspecified: Secondary | ICD-10-CM | POA: Diagnosis not present

## 2022-12-24 DIAGNOSIS — T402X5A Adverse effect of other opioids, initial encounter: Secondary | ICD-10-CM | POA: Diagnosis not present

## 2022-12-24 DIAGNOSIS — Z66 Do not resuscitate: Secondary | ICD-10-CM | POA: Diagnosis not present

## 2022-12-24 DIAGNOSIS — Z510 Encounter for antineoplastic radiation therapy: Secondary | ICD-10-CM | POA: Diagnosis not present

## 2022-12-24 DIAGNOSIS — R5383 Other fatigue: Secondary | ICD-10-CM | POA: Diagnosis not present

## 2022-12-24 DIAGNOSIS — Z743 Need for continuous supervision: Secondary | ICD-10-CM | POA: Diagnosis not present

## 2022-12-24 DIAGNOSIS — E440 Moderate protein-calorie malnutrition: Secondary | ICD-10-CM | POA: Diagnosis not present

## 2022-12-24 DIAGNOSIS — C3411 Malignant neoplasm of upper lobe, right bronchus or lung: Secondary | ICD-10-CM | POA: Diagnosis not present

## 2022-12-24 DIAGNOSIS — Z7189 Other specified counseling: Secondary | ICD-10-CM | POA: Diagnosis not present

## 2022-12-24 LAB — CBC
HCT: 28.4 % — ABNORMAL LOW (ref 36.0–46.0)
Hemoglobin: 9.1 g/dL — ABNORMAL LOW (ref 12.0–15.0)
MCH: 26.5 pg (ref 26.0–34.0)
MCHC: 32 g/dL (ref 30.0–36.0)
MCV: 82.8 fL (ref 80.0–100.0)
Platelets: 653 10*3/uL — ABNORMAL HIGH (ref 150–400)
RBC: 3.43 MIL/uL — ABNORMAL LOW (ref 3.87–5.11)
RDW: 19.2 % — ABNORMAL HIGH (ref 11.5–15.5)
WBC: 19.7 10*3/uL — ABNORMAL HIGH (ref 4.0–10.5)
nRBC: 0 % (ref 0.0–0.2)

## 2022-12-24 LAB — BASIC METABOLIC PANEL
Anion gap: 12 (ref 5–15)
BUN: 11 mg/dL (ref 8–23)
CO2: 24 mmol/L (ref 22–32)
Calcium: 8.2 mg/dL — ABNORMAL LOW (ref 8.9–10.3)
Chloride: 100 mmol/L (ref 98–111)
Creatinine, Ser: 0.42 mg/dL — ABNORMAL LOW (ref 0.44–1.00)
GFR, Estimated: >60 mL/min (ref >60)
Glucose, Bld: 98 mg/dL (ref 70–99)
Potassium: 3.7 mmol/L (ref 3.5–5.1)
Sodium: 136 mmol/L (ref 135–145)

## 2022-12-24 LAB — TYPE AND SCREEN
ABO/RH(D): O POS
Antibody Screen: NEGATIVE
Unit division: 0

## 2022-12-24 LAB — RAD ONC ARIA SESSION SUMMARY
Course Elapsed Days: 13
Plan Fractions Treated to Date: 9
Plan Prescribed Dose Per Fraction: 3 Gy
Plan Total Fractions Prescribed: 10
Plan Total Prescribed Dose: 30 Gy
Reference Point Dosage Given to Date: 27 Gy
Reference Point Session Dosage Given: 3 Gy
Session Number: 9

## 2022-12-24 LAB — MAGNESIUM: Magnesium: 1.7 mg/dL (ref 1.7–2.4)

## 2022-12-24 LAB — BPAM RBC
Blood Product Expiration Date: 202407012359
Unit Type and Rh: 5100

## 2022-12-24 LAB — PHOSPHORUS: Phosphorus: 4.6 mg/dL (ref 2.5–4.6)

## 2022-12-24 MED ORDER — ALBUTEROL SULFATE (2.5 MG/3ML) 0.083% IN NEBU: 2.5000 mg | INHALATION_SOLUTION | RESPIRATORY_TRACT | Status: DC | PRN

## 2022-12-24 MED ORDER — LIDOCAINE 5 % EX PTCH: 1.0000 | MEDICATED_PATCH | CUTANEOUS | 0 refills | Status: DC

## 2022-12-24 MED ORDER — ALPRAZOLAM 0.25 MG PO TABS
0.2500 mg | ORAL_TABLET | Freq: Every day | ORAL | 0 refills | Status: DC
Start: 2022-12-24 — End: 2023-02-03

## 2022-12-24 MED ORDER — POTASSIUM CHLORIDE CRYS ER 20 MEQ PO TBCR: 20.0000 meq | EXTENDED_RELEASE_TABLET | Freq: Every day | ORAL | 0 refills | Status: DC

## 2022-12-24 MED ORDER — LOPERAMIDE HCL 2 MG PO CAPS: 2.0000 mg | ORAL_CAPSULE | Freq: Three times a day (TID) | ORAL | 0 refills | Status: DC | PRN

## 2022-12-24 MED ORDER — FUROSEMIDE 10 MG/ML IJ SOLN
40.0000 mg | Freq: Once | INTRAMUSCULAR | Status: AC
  Administered 2022-12-24: 40 mg via INTRAVENOUS
  Filled 2022-12-24: qty 4

## 2022-12-24 MED ORDER — ALBUTEROL SULFATE HFA 108 (90 BASE) MCG/ACT IN AERS
2.0000 | INHALATION_SPRAY | RESPIRATORY_TRACT | Status: DC | PRN
Start: 2022-12-24 — End: 2023-02-03

## 2022-12-24 MED ORDER — MORPHINE SULFATE ER 15 MG PO TBCR: 15.0000 mg | EXTENDED_RELEASE_TABLET | Freq: Two times a day (BID) | ORAL | 0 refills | Status: DC

## 2022-12-24 MED ORDER — FUROSEMIDE 40 MG PO TABS
40.0000 mg | ORAL_TABLET | Freq: Every day | ORAL | Status: DC
  Administered 2022-12-24: 40 mg via ORAL
  Filled 2022-12-24: qty 1

## 2022-12-24 MED ORDER — HEPARIN SOD (PORK) LOCK FLUSH 100 UNIT/ML IV SOLN
500.0000 [IU] | INTRAVENOUS | Status: AC | PRN
  Administered 2022-12-24: 500 [IU]
  Filled 2022-12-24: qty 5

## 2022-12-24 MED ORDER — ACETAMINOPHEN 500 MG PO TABS: 1000.0000 mg | ORAL_TABLET | Freq: Two times a day (BID) | ORAL | Status: DC

## 2022-12-24 MED ORDER — FUROSEMIDE 20 MG PO TABS
40.00 mg | ORAL_TABLET | Freq: Every day | ORAL | Status: DC
Start: 2022-12-24 — End: 2023-02-03

## 2022-12-24 MED ORDER — OXYCODONE HCL 5 MG PO TABS: 5.0000 mg | ORAL_TABLET | Freq: Four times a day (QID) | ORAL | 0 refills | Status: DC | PRN

## 2022-12-24 NOTE — Progress Notes (Signed)
Attempted to call report to [facility]. Phone continued to ring with no answer. Will attempt again later.

## 2022-12-24 NOTE — Progress Notes (Signed)
Attempted x2 to call report to [facility]. Phone continued to ring with no answer.

## 2022-12-24 NOTE — Progress Notes (Signed)
Received a message from Cathlyn Parsons at [facility] requesting report. Attempted to call report for a fourth time. My call was answered and I was transferred to Lawayne's extension where the phone continued to ring with no answer.

## 2022-12-24 NOTE — TOC Transition Note (Addendum)
Transition of Care [facility]) - CM/SW Discharge Note   Patient Details  Name: Courtney Brown MRN: [id number] Date of Birth: 1941-05-12  [facility]) CM/SW Contact:  Beckie Busing, RN Phone Number:[phone]  12/24/2022, 11:22 AM   Clinical Narrative:    CM has confirmed that patient can discharge to [facility]. Discharge summary has been faxed. Daughter has been updated Discharge packet is at nurses station. Transportation arranged per [employer].  Transport to arrive in about hour or hour and a half.    Final next level of care: Skilled Nursing Facility Barriers to Discharge: No Barriers Identified   Patient Goals and CMS Choice CMS Medicare.gov Compare Post Acute Care list provided to:: Patient Choice offered to / list presented to : Patient  Discharge Placement                [facility] Patient to be transferred to facility by: ptar Name of family member notified: Leone Payor message left for daughterdue to no answer Patient and family notified of of transfer: 12/24/22  Discharge Plan and Services Additional resources added to the After Visit Summary for   In-house Referral: Clinical Social Work   Post Acute Care Choice: Skilled Nursing Facility          DME Arranged: N/A DME Agency: NA       HH Arranged: NA HH Agency: NA        Social Determinants of Health (SDOH) Interventions SDOH Screenings   Food Insecurity: No Food Insecurity (12/06/2022)  Housing: Low Risk  (12/06/2022)  Transportation Needs: No Transportation Needs (12/06/2022)  Utilities: Not At Risk (12/06/2022)  Depression (PHQ2-9): Low Risk  (12/02/2022)  Financial Resource Strain: Low Risk  (11/01/2022)  Physical Activity: Sufficiently Active (11/01/2022)  Social Connections: Unknown (11/01/2022)  Stress: No Stress Concern Present (11/01/2022)  Tobacco Use: Medium Risk (12/09/2022)     Readmission Risk Interventions     No data to display

## 2022-12-24 NOTE — Discharge Summary (Signed)
Physician Discharge Summary  Courtney Brown ZOX:[id number] DOB: 1941/04/02 DOA: 12/05/2022  PCP: [name], MD  Admit date: 12/05/2022 Discharge date: 12/24/2022  Admitted From: Home Disposition: SNF Recommendations for Outpatient Follow-up:  Follow up with SNF provider at earliest convenience  Outpatient follow-up with palliative care/oncology/radiation oncology  follow up in ED if symptoms worsen or new appear   Home Health: No Equipment/Devices: None  Discharge Condition: Guarded to poor CODE STATUS: DNR Diet recommendation: Heart healthy/fluid restriction of up to 1500 cc a day  Brief/Interim Summary: 82 year old female with PMH significant for COPD, hypertension, recently diagnosed 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 CT angiogram of the chest /abdomen/pelvis showed necrotic right upper lung mass with increased size from recent exam with bony destruction of the third and fourth rib on the right side, multiple pulmonary lesions, metastatic lesion on the left lung. She was admitted for intractable pain from cancer center. Hospital course also remarkable for severe epigastric pain, found to have pancreatitis. Hospital course also remarkable for severe epigastric pain, found to have pancreatitis which has improved with conservative management.  PT now recommending SNF placement.  She is very weak and deconditioned.  She is currently medically stable for to charge to SNF.  She will be discharged to SNF once bed is available.  Palliative care recommended outpatient palliative care follow-up.  Discharge Diagnoses:  Recent diagnosis of stage IV lung cancer / Cancer related pain:  -Continue current pain medication regimen including MS Contin, as needed oxycodone.  Continue lidocaine patch.  Outpatient follow-up with oncology for initiation of chemotherapy as an outpatient.  Currently  getting radiation treatment.  Outpatient follow-up with radiation oncology.  Her performance status is very poor.   -Not sure if she can tolerate chemotherapy. -Palliative care evaluation and follow-up appreciated: Outpatient follow-up with palliative care.   Acute pancreatitis: > Resolved. -She complained of severe epigastric pain on 5/13.  CT scan showed acute pancreatitis without complication or necrosis.  Lipase minimally elevated but now has resolved.  Treated with IV fluids and subsequently discontinued.  -Currently tolerating advanced diet.  Leukocytosis: Still significant Unclear etiology but most likely secondary to malignancy.  Cultures have been negative so far.  Empirically started on Zosyn which has subsequently been discontinued.  Currently being monitored off of antibiotics.  Outpatient follow-up.    Chronic diastolic CHF:  -Last echo showed EF of 55 to 60%, grade 1 diastolic dysfunction. Venous Doppler of lower extremity negative for DVT -Will give her a dose of IV Lasix today prior to discharge.  Will continue Lasix 40 mg daily on discharge.  Outpatient follow-up with cardiology.   Hypomagnesemia:  -Resolved  Hyponatremia -Resolved   COPD: Currently without exacerbation.   Continue as needed bronchodilators.  Continue Symbicort.  Continue oxygen supplementation if needed   Hypertension: Continue Toprol and Lasix.  Outpatient follow-up.   Goals of care: Poor prognosis secondary to stage IV lung cancer with mets.   Palliative care following for goals of care.  CODE STATUS is DNR. Plan for outpatient follow up with palliative care.   Patient, family still planning to continue chemotherapy as an outpatient.   Her performance status is very poor.  She is very weak.  Continue PT at SNF.  Obesity: Outpatient follow-up  Moderate Malnutrition -Follow nutrition recommendations   Normochromic normocytic anemia: -Received a unit of packed red cells on 12/23/2022 for  hemoglobin  of 7.2.  Hemoglobin 9.1 today.  Outpatient follow-up.   Discharge Instructions  Discharge Instructions     Amb Referral to Palliative Care   Complete by: As directed    Diet - low sodium heart healthy   Complete by: As directed    Increase activity slowly   Complete by: As directed    No wound care   Complete by: As directed       Allergies as of 12/24/2022       Reactions   Prednisone Other (See Comments)   Leg pain,light headed   Statins    Myalgias- per patient, she feels better off the medication        Medication List     STOP taking these medications    gabapentin 100 MG capsule Commonly known as: NEURONTIN   HYDROcodone-Acetaminophen 7.5-300 MG Tabs   morphine 15 MG tablet Commonly known as: MSIR Replaced by: morphine 15 MG 12 hr tablet       TAKE these medications    ACCRUFeR 30 MG Caps Generic drug: Ferric Maltol Take 1 capsule by mouth in the morning and at bedtime.   acetaminophen 500 MG tablet Commonly known as: TYLENOL Take 2 tablets (1,000 mg total) by mouth every 12 (twelve) hours. What changed:  how much to take when to take this reasons to take this   albuterol 108 (90 Base) MCG/ACT inhaler Commonly known as: VENTOLIN HFA Inhale 2 puffs into the lungs every 4 (four) hours as needed for wheezing or shortness of breath. INHALE 1-2 PUFFS BY MOUTH EVERY 6 HOURS AS NEEDED FOR WHEEZE OR SHORTNESS OF BREATH What changed:  how much to take how to take this when to take this reasons to take this   ALPRAZolam 0.25 MG tablet Commonly known as: XANAX Take 1 tablet (0.25 mg total) by mouth at bedtime.   budesonide-formoterol 160-4.5 MCG/ACT inhaler Commonly known as: Symbicort TAKE 2 PUFFS BY MOUTH TWICE A DAY What changed:  how much to take how to take this when to take this additional instructions   CITRACAL + D PO Take 1 tablet by mouth in the morning. 600 mg calcium   colesevelam 625 MG tablet Commonly known as:  WELCHOL TAKE 1 TABLET (625 MG TOTAL) BY MOUTH 2 (TWO) TIMES DAILY WITH A MEAL.   dexamethasone 4 MG tablet Commonly known as: DECADRON Take 2 tablets daily for 2 days, start the day after chemotherapy. Take with food.   fenofibrate 145 MG tablet Commonly known as: TRICOR TAKE 1 TABLET BY MOUTH EVERY DAY   folic acid 1 MG tablet Commonly known as: FOLVITE TAKE 1 TABLET BY MOUTH EVERY DAY   furosemide 20 MG tablet Commonly known as: LASIX Take 2 tablets (40 mg total) by mouth daily. What changed: how much to take   Garlic Oil 1000 MG Caps Take 1,000 mg by mouth daily.   lidocaine 5 % Commonly known as: LIDODERM Place 1 patch onto the skin daily. Remove & Discard patch within 12 hours or as directed by MD   loperamide 2 MG capsule Commonly known as: IMODIUM Take 1 capsule (2 mg total) by mouth every 8 (eight) hours as needed for diarrhea or loose stools.   loratadine 10 MG tablet Commonly known as: CLARITIN TAKE 1 TABLET BY MOUTH EVERY DAY   magnesium oxide 400 (240 Mg) MG tablet Commonly known as: MAG-OX TAKE 1 TABLET BY MOUTH TWICE A DAY   metoprolol succinate 50 MG 24 hr tablet Commonly  known as: TOPROL-XL Take 1 tablet (50 mg total) by mouth daily.   mometasone 50 MCG/ACT nasal spray Commonly known as: NASONEX PLACE 2 SPRAYS INTO THE NOSE DAILY. What changed:  when to take this reasons to take this   morphine 15 MG 12 hr tablet Commonly known as: MS CONTIN Take 1 tablet (15 mg total) by mouth every 12 (twelve) hours. Replaces: morphine 15 MG tablet   omeprazole 20 MG capsule Commonly known as: PRILOSEC TAKE 1 CAPSULE BY MOUTH EVERY DAY What changed: how much to take   ondansetron 8 MG tablet Commonly known as: Zofran Take 1 tablet (8 mg total) by mouth every 8 (eight) hours as needed for nausea or vomiting. Start on the third day after chemotherapy. What changed:  medication strength how much to take additional instructions   oxyCODONE 5 MG  immediate release tablet Commonly known as: Oxy IR/ROXICODONE Take 1 tablet (5 mg total) by mouth every 6 (six) hours as needed for moderate pain or severe pain.   potassium chloride SA 20 MEQ tablet Commonly known as: KLOR-CON M Take 1 tablet (20 mEq total) by mouth daily. Start taking on: Dec 25, 2022   primidone 50 MG tablet Commonly known as: MYSOLINE Take 1 tablet (50 mg total) by mouth at bedtime.   Probiotic Acidophilus Caps Take 1 tablet by mouth daily.   prochlorperazine 10 MG tablet Commonly known as: COMPAZINE Take 1 tablet (10 mg total) by mouth every 6 (six) hours as needed for nausea or vomiting.   raloxifene 60 MG tablet Commonly known as: EVISTA TAKE 1 TABLET BY MOUTH EVERY DAY   triamcinolone cream 0.1 % Commonly known as: KENALOG Apply 1 Application topically daily as needed (yeast infection under breast).   Zinc 50 MG Tabs TAKE 1 TABLET BY MOUTH EVERY DAY               Durable Medical Equipment  (From admission, onward)           Start     Ordered   12/13/22 0728  For home use only DME oxygen  Once       Question Answer Comment  Length of Need Lifetime   Mode or (Route) Nasal cannula   Liters per Minute 2   Frequency Continuous (stationary and portable oxygen unit needed)   Oxygen delivery system Gas      12/13/22 0727            Allergies  Allergen Reactions   Prednisone Other (See Comments)    Leg pain,light headed   Statins     Myalgias- per patient, she feels better off the medication    Consultations: Palliative care   Procedures/Studies: DG CHEST PORT 1 VIEW  Result Date: 12/24/2022 CLINICAL DATA:  Dyspnea. EXAM: PORTABLE CHEST 1 VIEW COMPARISON:  12/22/2022 FINDINGS: Again noted is a large opacity in the right upper lung compatible with patient's known lung mass. Increased lucency at the right lung apex compared to the recent comparison examination. Right jugular port is stable with the tip in the SVC region.  Right lung base has improved aeration from the recent comparison examination. No significant airspace disease or consolidation in left lung. Heart mediastinum are stable. Atherosclerotic calcifications at the aortic arch. IMPRESSION: 1. Persistent opacity in the right upper lung compatible with the patient's known mass. 2. Improved aeration at the right lung apex and right lung base compared to the recent comparison examination. Findings could represent decreased pleural fluid. 3. Stable  position of Port-A-Cath. Electronically Signed   By: Richarda Overlie M.D.   On: 12/24/2022 08:32   DG CHEST PORT 1 VIEW  Result Date: 12/22/2022 CLINICAL DATA:  Cough. EXAM: PORTABLE CHEST 1 VIEW COMPARISON:  Dec 10, 2022 FINDINGS: Similar in appearance large opacity in the right upper lobe. Moderate right pleural effusion. The left lung is clear. Mildly enlarged cardiac silhouette. Calcific atherosclerotic disease and tortuosity of the aorta. Injectable port in stable position. IMPRESSION: 1. Similar in appearance large opacity in the right upper lobe. 2. Moderate right pleural effusion. Electronically Signed   By: Ted Mcalpine M.D.   On: 12/22/2022 20:15   VAS [place] LOWER EXTREMITY VENOUS (DVT)  Result Date: 12/11/2022  Lower Venous DVT Study Patient Name:  KEASHIA PRINE  Date of Exam:   12/11/2022 Medical Rec #: [id number]      Accession #:    [id number] Date of Birth: 1940/08/21      Patient Gender: F Patient Age:   4 years Exam Location:  [facility] Procedure:      VAS [place] LOWER EXTREMITY VENOUS (DVT) Referring Phys: AMRIT ADHIKARI --------------------------------------------------------------------------------  Indications: Edema.  Risk Factors: None identified. Limitations: Poor ultrasound/tissue interface. Comparison Study: No prior studies. Performing Technologist: Chanda Busing RVT  Examination Guidelines: A complete evaluation includes B-mode imaging, spectral Doppler, color Doppler, and power Doppler as  needed of all accessible portions of each vessel. Bilateral testing is considered an integral part of a complete examination. Limited examinations for reoccurring indications may be performed as noted. The reflux portion of the exam is performed with the patient in reverse Trendelenburg.  +---------+---------------+---------+-----------+----------+--------------+ RIGHT    CompressibilityPhasicitySpontaneityPropertiesThrombus Aging +---------+---------------+---------+-----------+----------+--------------+ CFV      Full           Yes      Yes                                 +---------+---------------+---------+-----------+----------+--------------+ SFJ      Full                                                        +---------+---------------+---------+-----------+----------+--------------+ FV Prox  Full                                                        +---------+---------------+---------+-----------+----------+--------------+ FV Mid                  Yes      Yes                                 +---------+---------------+---------+-----------+----------+--------------+ FV Distal               Yes      Yes                                 +---------+---------------+---------+-----------+----------+--------------+ PFV      Full                                                        +---------+---------------+---------+-----------+----------+--------------+  POP      Full           Yes      Yes                                 +---------+---------------+---------+-----------+----------+--------------+ PTV      Full                                                        +---------+---------------+---------+-----------+----------+--------------+ PERO     Full                                                        +---------+---------------+---------+-----------+----------+--------------+    +---------+---------------+---------+-----------+----------+--------------+ LEFT     CompressibilityPhasicitySpontaneityPropertiesThrombus Aging +---------+---------------+---------+-----------+----------+--------------+ CFV      Full           Yes      Yes                                 +---------+---------------+---------+-----------+----------+--------------+ SFJ      Full                                                        +---------+---------------+---------+-----------+----------+--------------+ FV Prox  Full                                                        +---------+---------------+---------+-----------+----------+--------------+ FV Mid                  Yes      Yes                                 +---------+---------------+---------+-----------+----------+--------------+ FV Distal               Yes      Yes                                 +---------+---------------+---------+-----------+----------+--------------+ PFV      Full                                                        +---------+---------------+---------+-----------+----------+--------------+ POP      Full           Yes      Yes                                 +---------+---------------+---------+-----------+----------+--------------+  PTV      Full                                                        +---------+---------------+---------+-----------+----------+--------------+ PERO     Full                                                        +---------+---------------+---------+-----------+----------+--------------+     Summary: RIGHT: - There is no evidence of deep vein thrombosis in the lower extremity. However, portions of this examination were limited- see technologist comments above.  - No cystic structure found in the popliteal fossa.  LEFT: - There is no evidence of deep vein thrombosis in the lower extremity. However, portions of this examination were  limited- see technologist comments above.  - No cystic structure found in the popliteal fossa.  *See table(s) above for measurements and observations. Electronically signed by Heath Lark on 12/11/2022 at 4:53:10 PM.    Final    DG CHEST PORT 1 VIEW  Result Date: 12/10/2022 CLINICAL DATA:  Shortness of breath EXAM: PORTABLE CHEST 1 VIEW COMPARISON:  12/05/2022, PET CT 11/18/2022 FINDINGS: Right-sided central venous port tip over the SVC. Large right upper lobe lung mass with adjacent pleural disease or effusion. Increased small left effusion. Stable cardiomediastinal silhouette with aortic atherosclerosis. No pneumothorax IMPRESSION: Large right upper lobe lung mass with adjacent pleural disease or effusion. Increased small left pleural effusion since prior exam. Electronically Signed   By: Jasmine Pang M.D.   On: 12/10/2022 15:38   CT ABDOMEN PELVIS W CONTRAST  Result Date: 12/08/2022 CLINICAL DATA:  Severe acute epigastric abdominal pain. History of lung carcinoma. EXAM: CT ABDOMEN AND PELVIS WITH CONTRAST TECHNIQUE: Multidetector CT imaging of the abdomen and pelvis was performed using the standard protocol following bolus administration of intravenous contrast. RADIATION DOSE REDUCTION: This exam was performed according to the departmental dose-optimization program which includes automated exposure control, adjustment of the mA and/or kV according to patient size and/or use of iterative reconstruction technique. CONTRAST:  OMNIPAQUE IOHEXOL 300 MG/ML  SOLN COMPARISON:  CT of the chest, abdomen and pelvis on 12/05/2022 FINDINGS: Lower chest: Stable small right pleural effusion and trace left pleural fluid. Hepatobiliary: No focal liver abnormality is seen. Status post cholecystectomy. No biliary dilatation. Pancreas: New acute pancreatitis since the prior scan with peripancreatic inflammation and edema surrounding the entire pancreas. No evidence of pancreatic necrosis, pseudocyst or pancreatic  ductal dilatation. No associated pancreatic masses. Spleen: Normal in size without focal abnormality. Adrenals/Urinary Tract: Adrenal glands are unremarkable. Stable appearance of kidneys including bilateral cysts and a probable hemorrhagic cyst of the left upper pole. Bladder is unremarkable. Stomach/Bowel: No bowel obstruction or free intraperitoneal air. Potential mild ileus involving the ascending and transverse colon. Vascular/Lymphatic: Stable atherosclerosis of the abdominal aorta and iliac arteries without aneurysm. Reproductive: Status post hysterectomy. No adnexal masses. Other: New free fluid in the pelvis.  No discrete abscess. Musculoskeletal: No acute or significant osseous findings. IMPRESSION: 1. New acute pancreatitis with peripancreatic inflammation and edema surrounding the entire pancreas. No evidence of pancreatic necrosis, pseudocyst or pancreatic ductal dilatation. 2. New free fluid in  the pelvis. 3. Potential mild ileus involving the ascending and transverse colon. 4. Stable small right pleural effusion and trace left pleural fluid. 5. Stable atherosclerosis of the abdominal aorta and iliac arteries. Aortic Atherosclerosis (ICD10-I70.0). Electronically Signed   By: Irish Lack M.D.   On: 12/08/2022 18:01   DG C-Arm 1-60 Min-No Report  Result Date: 12/08/2022 Fluoroscopy was utilized by the requesting physician.  No radiographic interpretation.   CT CHEST ABDOMEN PELVIS W CONTRAST  Result Date: 12/05/2022 CLINICAL DATA:  Sepsis, history of pulmonary malignancy. Shortness of breath, nausea and vomiting. EXAM: CT CHEST, ABDOMEN, AND PELVIS WITH CONTRAST TECHNIQUE: Multidetector CT imaging of the chest, abdomen and pelvis was performed following the standard protocol during bolus administration of intravenous contrast. RADIATION DOSE REDUCTION: This exam was performed according to the departmental dose-optimization program which includes automated exposure control, adjustment of the  mA and/or kV according to patient size and/or use of iterative reconstruction technique. CONTRAST:  OMNIPAQUE IOHEXOL 300 MG/ML  SOLN COMPARISON:  02/13/2021, 11/18/2022, 10/23/2022. FINDINGS: CT CHEST FINDINGS Cardiovascular: Heart is enlarged and there is a trace pericardial effusion. Few scattered coronary artery calcifications are noted. There is atherosclerotic calcification of the aorta without evidence of aneurysm. The pulmonary trunk is normal in caliber. Mediastinum/Nodes: Enlarged lymph nodes are present in the mediastinum measuring up to 1 cm in the right paratracheal space there suspected enlarged lymph nodes at the right hilum measuring up to 1.5 cm. No left hilar or axillary lymphadenopathy. The thyroid gland, trachea, and esophagus are within normal limits. Lungs/Pleura: Paraseptal and centrilobular emphysematous changes are present in the lungs. There is a small loculated pleural effusion on the right. There is redemonstration of a right upper lobe mass with central necrosis and extension of tumor into the chest wall musculature and soft tissues on the right. Atelectasis is present at the right lung base. Spiculated opacities are noted in the left upper lobe with a few satellite nodules and not significantly changed from the prior exam. Few pulmonary nodules are noted in the left lower lobe, the largest measuring 5 mm, axial image 93, new from the previous exam. Musculoskeletal: There are erosions and bony destruction involving the T3 and T4 ribs on the right. No acute fracture is seen. CT ABDOMEN PELVIS FINDINGS Hepatobiliary: No focal liver abnormality is seen. Status post cholecystectomy. No biliary dilatation. Pancreas: Unremarkable. No pancreatic ductal dilatation or surrounding inflammatory changes. Spleen: Normal in size without focal abnormality. 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 The left adrenal gland is within normal  limits. The kidneys enhance symmetrically with multifocal renal cortical thinning or scarring. Stable cysts and hypodensities are present within the kidneys bilaterally. No renal calculus or hydronephrosis. The bladder is unremarkable. Stomach/Bowel: Stomach is within normal limits. Appendix appears normal. No evidence of bowel wall thickening, distention, or inflammatory changes. No free air or pneumatosis. A few scattered diverticula are present along the colon without evidence of diverticulitis. Vascular/Lymphatic: Aortic atherosclerosis. No enlarged abdominal or pelvic lymph nodes. Reproductive: Status post hysterectomy. No adnexal masses. Other: A trace amount of free fluid is noted in the pelvis. 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 Musculoskeletal: No acute or suspicious osseous abnormality. IMPRESSION: 1. Necrotic right upper lobe mass, increased in size from the prior exam with extension into the chest wall musculature on the right with erosions and  destructive changes of the T3 and T4 ribs on the right. 2. Small right pleural effusion with atelectasis. 3. Stable spiculated opacities in the left upper lobe with a few surrounding satellite nodules and new pulmonary nodules in the left lower lobe, suspicious for metastasis. 4. No acute process in the abdomen and pelvis. 5. Coronary artery calcifications and aortic atherosclerosis. Electronically Signed   By: Thornell Sartorius M.D.   On: 12/05/2022 20:44   DG Chest 2 View  Result Date: 12/05/2022 CLINICAL DATA:  Lung cancer.  Shortness of breath EXAM: CHEST - 2 VIEW COMPARISON:  X-ray 10/15/2022.  PET-CT scan 11/18/2022. FINDINGS: Large right upper lobe mass is increasing in size compared to the prior x-ray. Please correlate with more recent PET-CT. There is a small right effusion which is increased as well. Left lung is grossly clear  without pneumothorax or effusion. No left-sided consolidation. Stable cardiopericardial silhouette without edema. Overlapping cardiac leads. Film is rotated to the right. IMPRESSION: Increasing right-sided lung mass and right-sided pleural effusion. Please correlate with recent PET-CT Electronically Signed   By: Karen Kays M.D.   On: 12/05/2022 15:18      Subjective: Patient seen and examined at bedside.  Still intermittently short of breath with exertion.  No fever, vomiting, chest pain reported.  Discharge Exam: Vitals:   12/24/22 0750 12/24/22 0936  BP:  125/71  Pulse:  92  Resp:    Temp:    SpO2: 91%     General: Pt is alert, awake, not in acute distress.  Looks chronically ill and deconditioned.  Elderly female lying in bed.  Currently on room air. Cardiovascular: rate controlled, S1/S2 + Respiratory: bilateral decreased breath sounds at bases with scattered crackles Abdominal: Soft, obese, NT, ND, bowel sounds + Extremities: Trace lower extremity edema; no cyanosis    The results of significant diagnostics from this hospitalization (including imaging, microbiology, ancillary and laboratory) are listed below for reference.     Microbiology: No results found for this or any previous visit (from the past 240 hour(s)).   Labs: BNP (last 3 results) No results for input(s): "BNP" in the last 8760 hours. Basic Metabolic Panel: Recent Labs  Lab 12/19/22 0555 12/20/22 0611 12/21/22 0500 12/22/22 0532 12/23/22 0529 12/24/22 0516  NA 133* 134* 132*  --  133* 136  K 3.9 3.8 3.6  --  3.8 3.7  CL 95* 96* 94*  --  98 100  CO2 29 28 26   --  25 24  GLUCOSE 92 98 93  --  91 98  BUN 12 12 12   --  12 11  CREATININE 0.46 0.55 0.58  --  0.40* 0.42*  CALCIUM 8.5* 8.5* 8.5*  --  8.0* 8.2*  MG 1.9 1.6* 1.9 1.5* 2.0 1.7  PHOS 3.6 3.7 4.1 4.4 4.6 4.6   Liver Function Tests: No results for input(s): "AST", "ALT", "ALKPHOS", "BILITOT", "PROT", "ALBUMIN" in the last 168 hours. No  results for input(s): "LIPASE", "AMYLASE" in the last 168 hours. No results for input(s): "AMMONIA" in the last 168 hours. CBC: Recent Labs  Lab 12/20/22 0611 12/21/22 0500 12/22/22 0532 12/23/22 0529 12/24/22 0516  WBC 19.9* 18.7* 17.6* 18.3* 19.7*  HGB 8.0* 7.4* 7.4* 7.2* 9.1*  HCT 26.9* 24.3* 24.3* 24.0* 28.4*  MCV 82.8 82.1 81.8 82.5 82.8  PLT 522* 523* 539* 554* 653*   Cardiac Enzymes: No results for input(s): "CKTOTAL", "CKMB", "CKMBINDEX", "TROPONINI" in the last 168 hours. BNP: Invalid input(s): "POCBNP" CBG: Recent Labs  Lab 12/23/22 2155  GLUCAP 119*   D-Dimer No results for input(s): "DDIMER" in the last 72 hours. Hgb A1c No results for input(s): "HGBA1C" in the last 72 hours. Lipid Profile No results for input(s): "CHOL", "HDL", "LDLCALC", "TRIG", "CHOLHDL", "LDLDIRECT" in the last 72 hours. Thyroid function studies No results for input(s): "TSH", "T4TOTAL", "T3FREE", "THYROIDAB" in the last 72 hours.  Invalid input(s): "FREET3" Anemia work up No results for input(s): "VITAMINB12", "FOLATE", "FERRITIN", "TIBC", "IRON", "RETICCTPCT" in the last 72 hours. Urinalysis    Component Value Date/Time   COLORURINE YELLOW 12/05/2022 1439   APPEARANCEUR CLOUDY (A) 12/05/2022 1439   LABSPEC 1.010 12/05/2022 1439   PHURINE 7.0 12/05/2022 1439   GLUCOSEU NEGATIVE 12/05/2022 1439   GLUCOSEU NEGATIVE 08/21/2021 1552   HGBUR NEGATIVE 12/05/2022 1439   BILIRUBINUR NEGATIVE 12/05/2022 1439   KETONESUR NEGATIVE 12/05/2022 1439   PROTEINUR NEGATIVE 12/05/2022 1439   UROBILINOGEN 0.2 08/21/2021 1552   NITRITE NEGATIVE 12/05/2022 1439   LEUKOCYTESUR LARGE (A) 12/05/2022 1439   Sepsis Labs Recent Labs  Lab 12/21/22 0500 12/22/22 0532 12/23/22 0529 12/24/22 0516  WBC 18.7* 17.6* 18.3* 19.7*   Microbiology No results found for this or any previous visit (from the past 240 hour(s)).   Time coordinating discharge: 35 minutes  SIGNED:   Glade Lloyd,  MD  [facility] 12/24/2022, 9:46 AM

## 2022-12-24 NOTE — Progress Notes (Signed)
3rd attempt made to call report to [facility]. Phone continued to ring with no answer.

## 2022-12-24 NOTE — TOC Progression Note (Signed)
Transition of Care [facility]) - Progression Note    Patient Details  Name: Courtney Brown MRN: [id number] Date of Birth: September 09, 1940  Transition of [facility] CM/SW Contact  Beckie Busing, RN Phone Number:[phone]  12/24/2022, 8:42 AM  Clinical Narrative:    CM called [employer] to follow up on auth that was to be reviewed on 5/28. Per [employer] the patient has until midnight tonight to be admitted or new auth will be needed.    Expected Discharge Plan: Skilled Nursing Facility Barriers to Discharge: SNF Pending bed offer, Insurance Authorization  Expected Discharge Plan and Services In-house Referral: Clinical Social Work   Post Acute Care Choice: Skilled Nursing Facility Living arrangements for the past 2 months: Single Family Home                                       Social Determinants of Health (SDOH) Interventions SDOH Screenings   Food Insecurity: No Food Insecurity (12/06/2022)  Housing: Low Risk  (12/06/2022)  Transportation Needs: No Transportation Needs (12/06/2022)  Utilities: Not At Risk (12/06/2022)  Depression (PHQ2-9): Low Risk  (12/02/2022)  Financial Resource Strain: Low Risk  (11/01/2022)  Physical Activity: Sufficiently Active (11/01/2022)  Social Connections: Unknown (11/01/2022)  Stress: No Stress Concern Present (11/01/2022)  Tobacco Use: Medium Risk (12/09/2022)    Readmission Risk Interventions     No data to display

## 2022-12-24 NOTE — Progress Notes (Signed)
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 PMH: arthritis, melanoma, COPD, emphysema, HTN, MI, OP, PNA, TIA   OT comments  The pt was motivated to participate in the session, though she reported feelings of nausea, as well as slight abdominal discomfort. She was assisted to the bedside commode for toileting, then to the bedside chair to promote out of bed activity and general functional strengthening. She further reported improvement in her breathing this a.m., as she recently received a breathing treatment. She is making gradual functional progress. Continue OT plan of care.    Recommendations for follow up therapy are one component of a multi-disciplinary discharge planning process, led by the attending physician.  Recommendations may be updated based on patient status, additional functional criteria and insurance authorization.    Assistance Recommended at Discharge Intermittent Supervision/Assistance  Patient can return home with the following  Assistance with cooking/housework;Assist for transportation;Help with stairs or ramp for entrance;A little help with walking and/or transfers;A little help with bathing/dressing/bathroom   Equipment Recommendations  BSC/3in1       Precautions / Restrictions Precautions Precaution Comments: monitor sats Restrictions Weight Bearing Restrictions: No       Mobility Bed Mobility Overal bed mobility: Needs Assistance Bed Mobility: Supine to Sit     Supine to sit: Supervision, HOB elevated          Transfers Overall transfer level: Needs assistance Equipment used: Rolling walker (2 wheels) Transfers: Sit to/from Stand Sit to Stand: Min  guard                     ADL either performed or assessed with clinical judgement   ADL Overall ADL's : Needs assistance/impaired Eating/Feeding: Independent;Sitting   Grooming: Set up;Sitting           Upper Body Dressing : Set up;Sitting       Toilet Transfer: Min guard;BSC/3in1;Rolling walker (2 wheels) Toilet Transfer Details (indicate cue type and reason): Pt ambulated from the bed to bedside commode for toileting tasks. Toileting- Clothing Manipulation and Hygiene: Minimal assistance;Sit to/from stand Toileting - Clothing Manipulation Details (indicate cue type and reason): Pt performed hygiene with min guard assist in standing, however required light assistanceto ensure thoroughness in this regard.                       Cognition Arousal/Alertness: Awake/alert Behavior During Therapy: WFL for tasks assessed/performed Overall Cognitive Status: Within Functional Limits for tasks assessed                       Pertinent Vitals/ Pain       Pain Assessment Pain Location: Pt reported having mild abdominal discomfort. Pain Intervention(s): Limited activity within patient's tolerance, Monitored during session         Frequency  Min 1X/week        Progress Toward Goals  OT Goals(current goals can now be found in the care plan section)  Progress towards OT goals: Progressing toward goals  Acute Rehab OT Goals Patient Stated Goal: to get better OT Goal Formulation: With patient Time For Goal Achievement: 01/07/23 Potential to Achieve Goals: Good  Plan Discharge plan remains appropriate  AM-PAC OT "6 Clicks" Daily Activity     Outcome Measure   Help from another person eating meals?: None Help from another person taking care of personal grooming?: None Help from another person toileting, which includes using toliet, bedpan, or urinal?: A Little Help from another person bathing (including washing, rinsing, drying)?: A Lot Help  from another person to put on and taking off regular upper body clothing?: None Help from another person to put on and taking off regular lower body clothing?: A Lot 6 Click Score: 19    End of Session Equipment Utilized During Treatment: Rolling walker (2 wheels);Gait belt  OT Visit Diagnosis: Unsteadiness on feet (R26.81);Muscle weakness (generalized) (M62.81)   Activity Tolerance Patient tolerated treatment well   Patient Left in chair;with call bell/phone within reach;with nursing/sitter in room   Nurse Communication Mobility status        Time: 1610-9604 OT Time Calculation (min): 13 min  Charges: OT General Charges $OT Visit: 1 Visit OT Treatments $Self Care/Home Management : 8-22 mins     Reuben Likes, OTR/L 12/24/2022, 9:35 AM

## 2022-12-25 ENCOUNTER — Telehealth: Payer: Self-pay | Admitting: Oncology

## 2022-12-25 ENCOUNTER — Ambulatory Visit
Admission: RE | Admit: 2022-12-25 | Discharge: 2022-12-25 | Disposition: A | Discharge status: Home / Self Care | Payer: 59 | Source: Ambulatory Visit | Attending: Radiation Oncology | Admitting: Radiation Oncology

## 2022-12-25 ENCOUNTER — Other Ambulatory Visit: Payer: Self-pay

## 2022-12-25 ENCOUNTER — Telehealth: Payer: Self-pay

## 2022-12-25 DIAGNOSIS — C3411 Malignant neoplasm of upper lobe, right bronchus or lung: Secondary | ICD-10-CM | POA: Insufficient documentation

## 2022-12-25 DIAGNOSIS — Z510 Encounter for antineoplastic radiation therapy: Secondary | ICD-10-CM | POA: Diagnosis not present

## 2022-12-25 DIAGNOSIS — Z87891 Personal history of nicotine dependence: Secondary | ICD-10-CM | POA: Diagnosis not present

## 2022-12-25 LAB — RAD ONC ARIA SESSION SUMMARY
Course Elapsed Days: 14
Plan Fractions Treated to Date: 10
Plan Prescribed Dose Per Fraction: 3 Gy
Plan Total Fractions Prescribed: 10
Plan Total Prescribed Dose: 30 Gy
Reference Point Dosage Given to Date: 30 Gy
Reference Point Session Dosage Given: 3 Gy
Session Number: 10

## 2022-12-25 NOTE — Consult Note (Signed)
   [facility] CM Inpatient Consult   12/25/2022  Lismarie Fielding 09-Mar-1941 [id number]   Follow up:  [facility] Liaison to active [facility] RN Care Coordination, collaboration follow up, patient to SNF for patient at [facility] 18 days noted  Plan: Update [facility] RN Care Coordinator of transition to [facility] noted for 12/24/22.  Charlesetta Shanks, RN BSN CCM [facility]  [phone] business mobile phone Toll free office [phone]  *Concierge Line  [phone] Fax number: [phone] [email or web address]

## 2022-12-25 NOTE — Telephone Encounter (Signed)
Rn called pt daughter Pamelia Hoit to ensure we had a plan to get her here for her final radiation treatment. Pamelia Hoit stated she could get her here but it would have to be after work. Rn was able to call [facility] and get the time changed to 3:30 for treatment (so daughter can bring her after work). Voicemail left for [place] with new time per daughter request.

## 2022-12-25 NOTE — Telephone Encounter (Signed)
Called patient multiple times regarding 06/03 appointment, left a voicemail.

## 2022-12-26 ENCOUNTER — Other Ambulatory Visit: Payer: Self-pay | Admitting: Internal Medicine

## 2022-12-26 ENCOUNTER — Inpatient Hospital Stay: Payer: 59

## 2022-12-26 ENCOUNTER — Inpatient Hospital Stay (HOSPITAL_BASED_OUTPATIENT_CLINIC_OR_DEPARTMENT_OTHER): Payer: 59 | Admitting: Oncology

## 2022-12-26 ENCOUNTER — Ambulatory Visit: Payer: 59

## 2022-12-26 ENCOUNTER — Inpatient Hospital Stay: Payer: 59 | Admitting: Oncology

## 2022-12-26 VITALS — BP 142/52 | HR 114 | Temp 97.4°F | Resp 17 | Ht 61.52 in | Wt 145.2 lb

## 2022-12-26 DIAGNOSIS — J9612 Chronic respiratory failure with hypercapnia: Secondary | ICD-10-CM

## 2022-12-26 DIAGNOSIS — J449 Chronic obstructive pulmonary disease, unspecified: Secondary | ICD-10-CM

## 2022-12-26 DIAGNOSIS — C3411 Malignant neoplasm of upper lobe, right bronchus or lung: Secondary | ICD-10-CM

## 2022-12-26 DIAGNOSIS — Z87891 Personal history of nicotine dependence: Secondary | ICD-10-CM | POA: Diagnosis not present

## 2022-12-26 DIAGNOSIS — C3491 Malignant neoplasm of unspecified part of right bronchus or lung: Secondary | ICD-10-CM | POA: Diagnosis not present

## 2022-12-26 DIAGNOSIS — D649 Anemia, unspecified: Secondary | ICD-10-CM | POA: Insufficient documentation

## 2022-12-26 DIAGNOSIS — Z7189 Other specified counseling: Secondary | ICD-10-CM

## 2022-12-26 DIAGNOSIS — Z7982 Long term (current) use of aspirin: Secondary | ICD-10-CM | POA: Diagnosis not present

## 2022-12-26 DIAGNOSIS — Z85820 Personal history of malignant melanoma of skin: Secondary | ICD-10-CM | POA: Diagnosis not present

## 2022-12-26 DIAGNOSIS — Z66 Do not resuscitate: Secondary | ICD-10-CM | POA: Diagnosis not present

## 2022-12-26 LAB — CMP (CANCER CENTER ONLY)
ALT: 9 U/L (ref 0–44)
AST: 11 U/L — ABNORMAL LOW (ref 15–41)
Albumin: 2 g/dL — ABNORMAL LOW (ref 3.5–5.0)
Alkaline Phosphatase: 143 U/L — ABNORMAL HIGH (ref 38–126)
Anion gap: 13 (ref 5–15)
BUN: 9 mg/dL (ref 8–23)
CO2: 25 mmol/L (ref 22–32)
Calcium: 8.1 mg/dL — ABNORMAL LOW (ref 8.9–10.3)
Chloride: 101 mmol/L (ref 98–111)
Creatinine: 0.48 mg/dL (ref 0.44–1.00)
GFR, Estimated: >60 mL/min (ref >60)
Glucose, Bld: 132 mg/dL — ABNORMAL HIGH (ref 70–99)
Potassium: 3.7 mmol/L (ref 3.5–5.1)
Sodium: 139 mmol/L (ref 135–145)
Total Bilirubin: 0.3 mg/dL (ref 0.3–1.2)
Total Protein: 5.3 g/dL — ABNORMAL LOW (ref 6.5–8.1)

## 2022-12-26 LAB — CBC WITH DIFFERENTIAL (CANCER CENTER ONLY)
Abs Immature Granulocytes: 0.23 10*3/uL — ABNORMAL HIGH (ref 0.00–0.07)
Basophils Absolute: 0.1 10*3/uL (ref 0.0–0.1)
Basophils Relative: 0 %
Eosinophils Absolute: 0.3 10*3/uL (ref 0.0–0.5)
Eosinophils Relative: 1 %
HCT: 28.6 % — ABNORMAL LOW (ref 36.0–46.0)
Hemoglobin: 9.2 g/dL — ABNORMAL LOW (ref 12.0–15.0)
Immature Granulocytes: 1 %
Lymphocytes Relative: 2 %
Lymphs Abs: 0.5 10*3/uL — ABNORMAL LOW (ref 0.7–4.0)
MCH: 26.7 pg (ref 26.0–34.0)
MCHC: 32.2 g/dL (ref 30.0–36.0)
MCV: 82.9 fL (ref 80.0–100.0)
Monocytes Absolute: 1.6 10*3/uL — ABNORMAL HIGH (ref 0.1–1.0)
Monocytes Relative: 8 %
Neutro Abs: 18.7 10*3/uL — ABNORMAL HIGH (ref 1.7–7.7)
Neutrophils Relative %: 88 %
Platelet Count: 625 10*3/uL — ABNORMAL HIGH (ref 150–400)
RBC: 3.45 MIL/uL — ABNORMAL LOW (ref 3.87–5.11)
RDW: 20.4 % — ABNORMAL HIGH (ref 11.5–15.5)
WBC Count: 21.4 10*3/uL — ABNORMAL HIGH (ref 4.0–10.5)
nRBC: 0 % (ref 0.0–0.2)

## 2022-12-26 MED ORDER — SODIUM CHLORIDE 0.9% FLUSH
10.0000 mL | INTRAVENOUS | Status: DC | PRN
  Administered 2022-12-26: 10 mL via INTRAVENOUS

## 2022-12-26 MED ORDER — HEPARIN SOD (PORK) LOCK FLUSH 100 UNIT/ML IV SOLN
500.0000 [IU] | Freq: Once | INTRAVENOUS | Status: AC
  Administered 2022-12-26: 500 [IU] via INTRAVENOUS

## 2022-12-26 MED FILL — Dexamethasone Sodium Phosphate Inj 100 MG/10ML: INTRAMUSCULAR | Qty: 1 | Status: AC

## 2022-12-26 NOTE — Progress Notes (Signed)
[facility] Visit:  Patient Care Team: Courtney Grandchild, MD as PCP - General (Internal Medicine) Courtney Gess, MD as Consulting Physician (Cardiology) Courtney Muse, MD as Attending Physician (Hematology) Courtney [place], RN as [facility] Care Management  CHIEF COMPLAINTS/PURPOSE OF CONSULTATION:  Oncology History  Squamous cell lung cancer (HCC)  11/04/2022 Initial Diagnosis   Squamous cell lung cancer (HCC)   11/21/2022 Cancer Staging   Staging form: Lung, AJCC 8th Edition - Clinical stage from 11/21/2022: Stage IVA (cT4, cN2, cM1a) - Signed by Courtney Muse, MD on 12/03/2022 Histopathologic type: Squamous cell carcinoma, NOS Stage prefix: Initial diagnosis Histologic grade (G): G3 Histologic grading system: 4 grade system   Malignant neoplasm of upper lobe of right lung (HCC)  12/06/2022 Initial Diagnosis   Malignant neoplasm of upper lobe of right lung (HCC)   12/26/2022 -  Chemotherapy   Patient is on Treatment Plan : HEAD/NECK Carboplatin + Paclitaxel + XRT q7d       HISTORY OF PRESENTING ILLNESS: [facility] 82 y.o. female is here because of anemia Medical history notable for arthritis, history of melanoma, colon polyps, COPD, coronary artery disease, GERD, osteoporosis, hypertension, CVA, acute kidney injury Colonoscopy done 05/26/2014 by [facility] in [place], [place]: colon polyps removed. Recommended to repeat in 5years. EGD done 05/26/2014 by [facility]: mild gastritis.   February 26, 2022 ferritin 5.4 B12 163 zinc 51  June 04, 2022 WBC 14.7 hemoglobin 10.9 MCV 76 platelet count 637; 76 segs 10 lymphs 11 monos 2 eos 1 basophil. Ferritin 7.1  July 04 2022:  [facility] Consult In August 2023 patient was placed on oral iron, zinc, B12 injections.  She stopped taking oral iron for a time because it was costing too much.  She is now taking low dose oral iron with B12 and  folic acid as well as zinc.  She takes the supplements bid on an empty stomach.  Has some diarrhea which may be related to her medications.  Received PRBC's in 2015 in the setting of sepsis.  No history of IV iron.  No hematochezia, melena, BRBPR.  Stools dark from oral iron.  Has not had endoscopy since 2015.  Takes ibuprofen for pain and is on ASA 81 mg daily  Social:  Widowed.  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  Quit smoking 16 yrs ago.  EtOH very seldom.    [facility] Mother died 100 fall Father died 38's dementia, parkinsons Brother x 3.  All have had Parkinson's disease.  One alive.  Two died;  one had prostate cancer      WBC 15.1 hemoglobin 10.7 MCV 79 platelet count 650; 79 segs 8 lymphs 10 monos eos cell count 1.7%  Morphology notable for polychromasia Coombs negative haptoglobin 319 SPEP with IEP showed no paraprotein free kappa 70.7 lambda 52.3 with a kappa lambda 1.35 IgG 1089 IgA 370 IgM 90 Ferritin 115 Copper 111 B12 813 zinc 94 CMP notable for albumin 2.9 AST 13  July 11, 2022: Feraheme 510 mg IV  August 04, 2022: Patient message from [facility] to schedule an appointment  August 15, 2022.  Scheduled follow-up for management of anemia.  She is taking Nature's bounty gentle iron 28 mg daily which she is tolerating well. Patient will schedule [facility] appointment once daughter recovers from her dental surgery.     WBC 17.3 hemoglobin 11.2 MCV 81 platelet count 604; 79 segs 9 lymphs 10  monos 1 EO Ferritin 391 CMP notable for albumin 2.9  Calcium 10.5  Patient can take B12 by mouth  October 23, 2022: Presented to [facility] with several month history of right upper chest and shoulder discomfort Chest x-ray showed large rounded mass in the right upper lobe.  CTPA demonstrated small right paratracheal and subcarinal lymph nodes.  No left hilar adenopathy.  Diffuse emphysematous changes.  Small right pleural effusion.  Large geographic area of soft tissue density in  right upper lobe with mass effect.  Majority of right upper lobe bronchial tissues are occluded related to a central hilar mass.  Bony metastatic disease within the right fourth rib.  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  Moderate generalized atrophy and white matter disease likely from chronic microvascular ischemia.  Remote lacunar infarcts  November 18, 2022: PET/CT-centrally necrotic mass within right upper lobe measuring 10.7 x 7.9 cm (SUV 29.4).  Chest wall invasion with tumor extending into right fourth and fifth rib interspace.  Tumor extends into right hilum with encasement of right upper lobe and right middle lobe bronchus.  Several tracer avid nodules in left lung largest is in anterior left upper lobe measuring 1.2 cm (SUV 6.9) tracer avid right paratracheal lymph node.  Right adrenal nodule stable from prior imaging in July 2022.  Increased radiotracer uptake localized to distal right clavicle (SUV 3.18) with corresponding sclerotic changes on CT.  November 21 2022:  Scheduled follow up for newly diagnosed lung cancer.  Discussed results of labs and imaging with patient and daughter Here with daughter [facility]ney [facility], with whom she lives.   Taking Hydrocodone/APAP 5/325 mg q 6 hrs PRN for the chest wall pain but finds that it barely lasts through that time.  Discussed use of MSIR but would prefer to avoid it at this time  Will refer to Radiation Oncology for consideration of palliative XRT to RUL Guardant 360 ordered Refer for port placement  Dec 02 2022:  Radiation Oncology Consult   Dec 03 2022:  Scheduled follow up for newly  diagnosed lung cancer.  Has not been scheduled to see surgery. Just started taking Morphine yesterday; has taken 3 doses with good pain relief.    Dec 09 2022:  For radiation simulation  Dec 05 2022 through Dec 24 2022:  [facility] to [facility] for management of COPD, CHF, cancer related pain.  She was seen by palliative care during the hospitalization.  Patient was discharged to SNF.  She was made DNR during the hospitalization  Dec 26 2022:  Post [facility] follow up.   Yesterday completed course of palliative XRT to chest. Patient reports that pain in left chest is relatively unchanged. She is too weak to stand, bathe or do basic ADL's.  All of this is being done with aids at the SNF.  She has skin breakdown left elbow.  Reviewed course of recent events with patient and her daughter.     Review of Systems  Constitutional:  Negative for appetite change, chills, fatigue, fever and unexpected weight change.  HENT:   Positive for hearing loss. Negative for mouth sores, nosebleeds, sore throat, trouble swallowing and voice change.   Eyes:  Positive for  eye problems. Negative for icterus.       Has cataracts which interfere with vision as a result uses a walker  Respiratory:  Positive for shortness of breath. Negative for chest tightness, cough, hemoptysis and wheezing.        PND:  none Orthopnea:  none DOE:    Cardiovascular:  Negative for chest pain, leg swelling and palpitations.       PND:  none Orthopnea:  none  Gastrointestinal:  Negative for abdominal pain, blood in stool, constipation, diarrhea, nausea and vomiting.  Endocrine: Negative for hot flashes.       Cold intolerance:  none Heat intolerance:  none  Genitourinary:  Negative for bladder incontinence, difficulty urinating, dysuria, frequency, hematuria and nocturia.   Musculoskeletal:  Positive for arthralgias, back pain and gait problem. Negative for myalgias, neck pain and neck stiffness.       Has chronic back pain  Skin:   Negative for itching, rash and wound.  Neurological:  Positive for extremity weakness and gait problem. Negative for dizziness, headaches, light-headedness, numbness, seizures and speech difficulty.  Hematological:  Negative for adenopathy. Bruises/bleeds easily.  Psychiatric/Behavioral:  Negative for sleep disturbance and suicidal ideas. The patient is not nervous/anxious.     MEDICAL HISTORY: Past Medical History:  Diagnosis Date   Allergy    Anemia    Anxiety    Arthritis    Cancer (HCC)    hx of melanoma    Colon polyps    COPD (chronic obstructive pulmonary disease) (HCC)    Coronary artery disease    Dyspnea    with exertion    Edema    in lower extremties    Emphysema of lung (HCC)    GERD (gastroesophageal reflux disease)    H/O hiatal hernia    Hyperlipidemia    Hypertension    Inverted nipple    bilateral, pt says that's normal   Lung cancer (HCC) 10/23/2022   Malignant melanoma (HCC) 1983   again in 1995]   Myocardial infarct, old 2008   no stent placed   Osteoporosis    Pneumonia    hx of    Sepsis (HCC)    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    Stroke [facility])    TIA    SURGICAL HISTORY: Past Surgical History:  Procedure Laterality Date   ABDOMINAL HYSTERECTOMY  1998   BRONCHIAL BIOPSY  10/27/2022   Procedure: BRONCHIAL BIOPSIES;  Surgeon: Leslye Peer, MD;  Location: [facility];  Service: Cardiopulmonary;;   BRONCHIAL BRUSHINGS  10/27/2022   Procedure: BRONCHIAL BRUSHINGS;  Surgeon: Leslye Peer, MD;  Location: [facility];  Service: Cardiopulmonary;;   CHOLECYSTECTOMY  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   HEMOSTASIS CONTROL  10/27/2022   Procedure: HEMOSTASIS CONTROL;  Surgeon: Leslye Peer, MD;  Location: [facility];  Service: Cardiopulmonary;;   HERNIA REPAIR     umbilical, ventral   INCISIONAL HERNIA REPAIR N/A 10/26/2020   Procedure: DIAGNOSTIC LAPAROSCOPY MESH EXPLANTATION; SMALL BOWEL  RESECTION; PRIMARY REPAIR OF INCISIONAL HERNIA;  Surgeon: Sheliah Hatch De Blanch, MD;  Location: [facility];  Service: General;  Laterality: N/A;   INSERTION OF ILIAC STENT  2008   PORTACATH PLACEMENT N/A 12/08/2022   Procedure: PORT-A-CATH PLACEMENT WITH ULTRASOUND GUIDANCE;  Surgeon: Rodman Pickle, MD;  Location: [facility];  Service: General;  Laterality: N/A;   VIDEO BRONCHOSCOPY Right 10/27/2022   Procedure: VIDEO BRONCHOSCOPY WITHOUT FLUORO;  Surgeon: Leslye Peer, MD;  Location[facility];  Service: Cardiopulmonary;  Laterality: Right;    SOCIAL HISTORY: Social History   Socioeconomic History   Marital status: Widowed    Spouse name: Not on file   Number of children: 3   Years of education: Not on file   Highest education level: 12th grade  Occupational History   Not on file  Tobacco Use   Smoking status: Former    Packs/day: 1.50    Years: 44.00    Additional pack years: 0.00    Total pack years: 66.00    Types: Cigarettes    Quit date: 07/28/2005    Years since quitting: 17.4   Smokeless tobacco: Never  Vaping Use   Vaping Use: Never used  Substance and Sexual Activity   Alcohol use: Not Currently    Alcohol/week: 2.0 standard drinks of alcohol    Types: 2 Shots of liquor per week   Drug use: No   Sexual activity: Not Currently  Other Topics Concern   Not on file  Social History Narrative   Right handed   Caffeine 64 0z a day   Home is one story    Lives with daughter   Retired.   Social Determinants of Health   Financial Resource Strain: Low Risk  (11/01/2022)   Overall Financial Resource Strain (CARDIA)    Difficulty of Paying Living Expenses: Not hard at all  Food Insecurity: No Food Insecurity (12/06/2022)   Hunger Vital Sign    Worried About Running Out of Food in the Last Year: Never true    Ran Out of Food in the Last Year: Never true  Transportation Needs: No Transportation Needs (12/06/2022)   PRAPARE - Administrator, Civil Service  (Medical): No    Lack of Transportation (Non-Medical): No  Physical Activity: Sufficiently Active (11/01/2022)   Exercise Vital Sign    Days of Exercise per Week: 5 days    Minutes of Exercise per Session: 30 min  Stress: No Stress Concern Present (11/01/2022)   [employer] of Occupational Health - Occupational Stress Questionnaire    Feeling of Stress : Not at all  Social Connections: Unknown (11/01/2022)   Social Connection and Isolation Panel [NHANES]    Frequency of Communication with Friends and Family: More than three times a week    Frequency of Social Gatherings with Friends and Family: Patient declined    Attends Religious Services: Patient declined    Database administrator or Organizations: No    Attends Engineer, structural: Not on file    Marital Status: Widowed  Intimate Partner Violence: Not At Risk (12/06/2022)   Humiliation, Afraid, Rape, and Kick questionnaire    Fear of Current or Ex-Partner: No    Emotionally Abused: No    Physically Abused: No    Sexually Abused: No    FAMILY HISTORY Family History  Problem Relation Age of Onset   Arthritis Mother    Hearing loss Father    Colon cancer Brother    Hearing loss Maternal Grandmother    Cancer Maternal Grandmother        colon   Cancer Paternal Grandmother        colon   Hearing loss Maternal Aunt    Breast cancer Neg Hx     ALLERGIES:  is allergic to prednisone and statins.  MEDICATIONS:  Current Outpatient Medications  Medication Sig Dispense Refill   acetaminophen (TYLENOL) 500 MG tablet  Take 2 tablets (1,000 mg total) by mouth every 12 (twelve) hours.     albuterol (VENTOLIN HFA) 108 (90 Base) MCG/ACT inhaler Inhale 2 puffs into the lungs every 4 (four) hours as needed for wheezing or shortness of breath. INHALE 1-2 PUFFS BY MOUTH EVERY 6 HOURS AS NEEDED FOR WHEEZE OR SHORTNESS OF BREATH     ALPRAZolam (XANAX) 0.25 MG tablet Take 1 tablet (0.25 mg total) by mouth at bedtime. 5 tablet 0    budesonide-formoterol (SYMBICORT) 160-4.5 MCG/ACT inhaler TAKE 2 PUFFS BY MOUTH TWICE A DAY 30.6 each 1   Calcium Citrate-Vitamin D (CITRACAL + D PO) Take 1 tablet by mouth in the morning. 600 mg calcium     colesevelam (WELCHOL) 625 MG tablet TAKE 1 TABLET (625 MG TOTAL) BY MOUTH 2 (TWO) TIMES DAILY WITH A MEAL. 180 tablet 1   dexamethasone (DECADRON) 4 MG tablet Take 2 tablets daily for 2 days, start the day after chemotherapy. Take with food. 30 tablet 1   fenofibrate (TRICOR) 145 MG tablet TAKE 1 TABLET BY MOUTH EVERY DAY (Patient taking differently: Take 145 mg by mouth daily.) 90 tablet 1   Ferric Maltol (ACCRUFER) 30 MG CAPS Take 1 capsule by mouth in the morning and at bedtime. 180 capsule 0   folic acid (FOLVITE) 1 MG tablet TAKE 1 TABLET BY MOUTH EVERY DAY 90 tablet 1   furosemide (LASIX) 20 MG tablet Take 2 tablets (40 mg total) by mouth daily.     Garlic Oil 1000 MG CAPS Take 1,000 mg by mouth daily.     Lactobacillus (PROBIOTIC ACIDOPHILUS) CAPS Take 1 tablet by mouth daily.     lidocaine (LIDODERM) 5 % Place 1 patch onto the skin daily. Remove & Discard patch within 12 hours or as directed by MD 30 patch 0   loperamide (IMODIUM) 2 MG capsule Take 1 capsule (2 mg total) by mouth every 8 (eight) hours as needed for diarrhea or loose stools. 30 capsule 0   loratadine (CLARITIN) 10 MG tablet TAKE 1 TABLET BY MOUTH EVERY DAY 90 tablet 1   magnesium oxide (MAG-OX) 400 (240 Mg) MG tablet TAKE 1 TABLET BY MOUTH TWICE A DAY (Patient taking differently: Take 400 mg by mouth 2 (two) times daily.) 60 tablet 2   metoprolol succinate (TOPROL-XL) 50 MG 24 hr tablet Take 1 tablet (50 mg total) by mouth daily. 90 tablet 0   mometasone (NASONEX) 50 MCG/ACT nasal spray PLACE 2 SPRAYS INTO THE NOSE DAILY. 51 each 0   morphine (MS CONTIN) 15 MG 12 hr tablet Take 1 tablet (15 mg total) by mouth every 12 (twelve) hours. 10 tablet 0   omeprazole (PRILOSEC) 20 MG capsule TAKE 1 CAPSULE BY MOUTH EVERY DAY  (Patient taking differently: Take 20 mg by mouth daily.) 90 capsule 1   ondansetron (ZOFRAN) 8 MG tablet Take 1 tablet (8 mg total) by mouth every 8 (eight) hours as needed for nausea or vomiting. Start on the third day after chemotherapy. 30 tablet 1   oxyCODONE (OXY IR/ROXICODONE) 5 MG immediate release tablet Take 1 tablet (5 mg total) by mouth every 6 (six) hours as needed for moderate pain or severe pain. 10 tablet 0   potassium chloride SA (KLOR-CON M) 20 MEQ tablet Take 1 tablet (20 mEq total) by mouth daily. 30 tablet 0   primidone (MYSOLINE) 50 MG tablet Take 1 tablet (50 mg total) by mouth at bedtime. 90 tablet 3   prochlorperazine (COMPAZINE) 10 MG tablet  Take 1 tablet (10 mg total) by mouth every 6 (six) hours as needed for nausea or vomiting. 30 tablet 1   raloxifene (EVISTA) 60 MG tablet TAKE 1 TABLET BY MOUTH EVERY DAY 90 tablet 1   triamcinolone cream (KENALOG) 0.1 % Apply 1 Application topically daily as needed (yeast infection under breast).     Zinc 50 MG TABS TAKE 1 TABLET BY MOUTH EVERY DAY 90 tablet 1   No current [facility]-administered medications for this visit.    PHYSICAL EXAMINATION:  ECOG PERFORMANCE STATUS: 1 - Symptomatic but completely ambulatory   Vitals:   12/26/22 1522  BP: (!) 142/52  Pulse: (!) 114  Resp: 17  Temp: (!) 97.4 F (36.3 C)  SpO2: 98%     Filed Weights   12/26/22 1522  Weight: 145 lb 3.2 oz (65.9 kg)      Physical Exam Vitals and nursing note reviewed.  Constitutional:      Appearance: Normal appearance. She is obese. She is ill-appearing. She is not toxic-appearing or diaphoretic.     Comments: Here with daughter.  More ill appearing than at last visit.  Has walker  HENT:     Head: Normocephalic and atraumatic.     Right Ear: External ear normal.     Left Ear: External ear normal.     Nose: Nose normal. No congestion or rhinorrhea.  Eyes:     General: No scleral icterus.    Extraocular Movements: Extraocular movements  intact.     Conjunctiva/sclera: Conjunctivae normal.     Pupils: Pupils are equal, round, and reactive to light.  Cardiovascular:     Rate and Rhythm: Normal rate and regular rhythm.     Heart sounds: No murmur heard.    No friction rub. No gallop.  Pulmonary:     Breath sounds: No stridor. Wheezing present. No rhonchi.     Comments: Tachypneic  Abdominal:     General: Bowel sounds are normal.     Palpations: Abdomen is soft.     Tenderness: There is no abdominal tenderness. There is no guarding.  Musculoskeletal:        General: No swelling, tenderness or deformity.     Cervical back: Normal range of motion and neck supple. No rigidity or tenderness.     Right lower leg: Edema present.     Left lower leg: Edema present.  Lymphadenopathy:     Head:     Right side of head: No submental, submandibular, tonsillar, preauricular, posterior auricular or occipital adenopathy.     Left side of head: No submental, submandibular, tonsillar, preauricular, posterior auricular or occipital adenopathy.     Cervical: No cervical adenopathy.     Right cervical: No superficial, deep or posterior cervical adenopathy.    Left cervical: No superficial, deep or posterior cervical adenopathy.     Upper Body:     Right upper body: No supraclavicular, axillary, pectoral or epitrochlear adenopathy.     Left upper body: No supraclavicular, axillary, pectoral or epitrochlear adenopathy.  Skin:    General: Skin is warm.     Coloration: Skin is not jaundiced.  Neurological:     General: No focal deficit present.     Mental Status: She is alert and oriented to person, place, and time.     Cranial Nerves: No cranial nerve deficit.     Gait: Gait abnormal.  Psychiatric:        Mood and Affect: Mood normal.  Behavior: Behavior normal.        Thought Content: Thought content normal.        Judgment: Judgment normal.     Physical exam could not be conducted as part of a telephone visit  LABORATORY  DATA: I have personally reviewed the data as listed:  Infusion on 12/26/2022  Component Date Value Ref Range Status   WBC Count 12/26/2022 21.4 (H)  4.0 - 10.5 K/uL Final   RBC 12/26/2022 3.45 (L)  3.87 - 5.11 MIL/uL Final   Hemoglobin 12/26/2022 9.2 (L)  12.0 - 15.0 g/dL Final   HCT 16/04/9603 28.6 (L)  36.0 - 46.0 % Final   MCV 12/26/2022 82.9  80.0 - 100.0 fL Final   MCH 12/26/2022 26.7  26.0 - 34.0 pg Final   MCHC 12/26/2022 32.2  30.0 - 36.0 g/dL Final   RDW 54/03/8118 20.4 (H)  11.5 - 15.5 % Final   Platelet Count 12/26/2022 625 (H)  150 - 400 K/uL Final   nRBC 12/26/2022 0.0  0.0 - 0.2 % Final   Neutrophils Relative % 12/26/2022 88  % Final   Neutro Abs 12/26/2022 18.7 (H)  1.7 - 7.7 K/uL Final   Lymphocytes Relative 12/26/2022 2  % Final   Lymphs Abs 12/26/2022 0.5 (L)  0.7 - 4.0 K/uL Final   Monocytes Relative 12/26/2022 8  % Final   Monocytes Absolute 12/26/2022 1.6 (H)  0.1 - 1.0 K/uL Final   Eosinophils Relative 12/26/2022 1  % Final   Eosinophils Absolute 12/26/2022 0.3  0.0 - 0.5 K/uL Final   Basophils Relative 12/26/2022 0  % Final   Basophils Absolute 12/26/2022 0.1  0.0 - 0.1 K/uL Final   Immature Granulocytes 12/26/2022 1  % Final   Abs Immature Granulocytes 12/26/2022 0.23 (H)  0.00 - 0.07 K/uL Final   Performed at [facility], [address] [place], [place] [address]  Orders Only on 12/25/2022  Component Date Value Ref Range Status   Course ID 12/25/2022 C1_Chest   Final   Course Intent 12/25/2022 Curative   Final   Course Start Date 12/25/2022 12/09/2022  3:12 PM   Final   Session Number 12/25/2022 10   Final   Course First Treatment Date 12/25/2022 12/11/2022 12:04 PM   Final   Course Last Treatment Date 12/25/2022 12/25/2022  3:41 PM   Final   Course Elapsed Days 12/25/2022 14   Final   Reference Point ID 12/25/2022 Lung_R DP   Final   Reference Point Dosage Given to Da* 12/25/2022 30  Gy Final   Reference Point Session Dosage  Giv* 12/25/2022 3  Gy Final   Plan ID 12/25/2022 Lung_R   Final   Plan Name 12/25/2022 Lung_R   Final   Plan Fractions Treated to Date 12/25/2022 10   Final   Plan Total Fractions Prescribed 12/25/2022 10   Final   Plan Prescribed Dose Per Fraction 12/25/2022 3  Gy Final   Plan Total Prescribed Dose 12/25/2022 30.000000  Gy Final   Plan Primary Reference Point 12/25/2022 Lung_R DP   Final  No results displayed because visit has over 200 results.    Orders Only on 12/24/2022  Component Date Value Ref Range Status   Course ID 12/24/2022 C1_Chest   Final   Course Intent 12/24/2022 Curative   Final   Course Start Date 12/24/2022 12/09/2022  3:12 PM   Final   Session Number 12/24/2022 9   Final   Course First  Treatment Date 12/24/2022 12/11/2022 12:04 PM   Final   Course Last Treatment Date 12/24/2022 12/24/2022 11:11 AM   Final   Course Elapsed Days 12/24/2022 13   Final   Reference Point ID 12/24/2022 Lung_R DP   Final   Reference Point Dosage Given to Da* 12/24/2022 27  Gy Final   Reference Point Session Dosage Giv* 12/24/2022 3  Gy Final   Plan ID 12/24/2022 Lung_R   Final   Plan Name 12/24/2022 Lung_R   Final   Plan Fractions Treated to Date 12/24/2022 9   Final   Plan Total Fractions Prescribed 12/24/2022 10   Final   Plan Prescribed Dose Per Fraction 12/24/2022 3  Gy Final   Plan Total Prescribed Dose 12/24/2022 30.000000  Gy Final   Plan Primary Reference Point 12/24/2022 Lung_R DP   Final  Orders Only on 12/23/2022  Component Date Value Ref Range Status   Course ID 12/23/2022 C1_Chest   Final   Course Intent 12/23/2022 Curative   Final   Course Start Date 12/23/2022 12/09/2022  3:12 PM   Final   Session Number 12/23/2022 8   Final   Course First Treatment Date 12/23/2022 12/11/2022 12:04 PM   Final   Course Last Treatment Date 12/23/2022 12/23/2022 10:41 AM   Final   Course Elapsed Days 12/23/2022 12   Final   Reference Point ID 12/23/2022 Lung_R DP   Final   Reference Point  Dosage Given to Da* 12/23/2022 24  Gy Final   Reference Point Session Dosage Giv* 12/23/2022 3  Gy Final   Plan ID 12/23/2022 Lung_R   Final   Plan Name 12/23/2022 Lung_R   Final   Plan Fractions Treated to Date 12/23/2022 8   Final   Plan Total Fractions Prescribed 12/23/2022 10   Final   Plan Prescribed Dose Per Fraction 12/23/2022 3  Gy Final   Plan Total Prescribed Dose 12/23/2022 30.000000  Gy Final   Plan Primary Reference Point 12/23/2022 Lung_R DP   Final  Orders Only on 12/19/2022  Component Date Value Ref Range Status   Course ID 12/19/2022 C1_Chest   Final   Course Intent 12/19/2022 Curative   Final   Course Start Date 12/19/2022 12/09/2022  3:12 PM   Final   Session Number 12/19/2022 7   Final   Course First Treatment Date 12/19/2022 12/11/2022 12:04 PM   Final   Course Last Treatment Date 12/19/2022 12/19/2022  3:31 PM   Final   Course Elapsed Days 12/19/2022 8   Final   Reference Point ID 12/19/2022 Lung_R DP   Final   Reference Point Dosage Given to Da* 12/19/2022 21  Gy Final   Reference Point Session Dosage Giv* 12/19/2022 3  Gy Final   Plan ID 12/19/2022 Lung_R   Final   Plan Name 12/19/2022 Lung_R   Final   Plan Fractions Treated to Date 12/19/2022 7   Final   Plan Total Fractions Prescribed 12/19/2022 10   Final   Plan Prescribed Dose Per Fraction 12/19/2022 3  Gy Final   Plan Total Prescribed Dose 12/19/2022 30.000000  Gy Final   Plan Primary Reference Point 12/19/2022 Lung_R DP   Final  Orders Only on 12/18/2022  Component Date Value Ref Range Status   Course ID 12/18/2022 C1_Chest   Final   Course Intent 12/18/2022 Curative   Final   Course Start Date 12/18/2022 12/09/2022  3:12 PM   Final   Session Number 12/18/2022 6   Final  Course First Treatment Date 12/18/2022 12/11/2022 12:04 PM   Final   Course Last Treatment Date 12/18/2022 12/18/2022 11:25 AM   Final   Course Elapsed Days 12/18/2022 7   Final   Reference Point ID 12/18/2022 Lung_R DP   Final    Reference Point Dosage Given to Da* 12/18/2022 18  Gy Final   Reference Point Session Dosage Giv* 12/18/2022 3  Gy Final   Plan ID 12/18/2022 Lung_R   Final   Plan Name 12/18/2022 Lung_R   Final   Plan Fractions Treated to Date 12/18/2022 6   Final   Plan Total Fractions Prescribed 12/18/2022 10   Final   Plan Prescribed Dose Per Fraction 12/18/2022 3  Gy Final   Plan Total Prescribed Dose 12/18/2022 30.000000  Gy Final   Plan Primary Reference Point 12/18/2022 Lung_R DP   Final  Orders Only on 12/17/2022  Component Date Value Ref Range Status   Course ID 12/17/2022 C1_Chest   Final   Course Intent 12/17/2022 Curative   Final   Course Start Date 12/17/2022 12/09/2022  3:12 PM   Final   Session Number 12/17/2022 5   Final   Course First Treatment Date 12/17/2022 12/11/2022 12:04 PM   Final   Course Last Treatment Date 12/17/2022 12/17/2022 11:08 AM   Final   Course Elapsed Days 12/17/2022 6   Final   Reference Point ID 12/17/2022 Lung_R DP   Final   Reference Point Dosage Given to Da* 12/17/2022 15  Gy Final   Reference Point Session Dosage Giv* 12/17/2022 3  Gy Final   Plan ID 12/17/2022 Lung_R   Final   Plan Name 12/17/2022 Lung_R   Final   Plan Fractions Treated to Date 12/17/2022 5   Final   Plan Total Fractions Prescribed 12/17/2022 10   Final   Plan Prescribed Dose Per Fraction 12/17/2022 3  Gy Final   Plan Total Prescribed Dose 12/17/2022 30.000000  Gy Final   Plan Primary Reference Point 12/17/2022 Lung_R DP   Final  Orders Only on 12/16/2022  Component Date Value Ref Range Status   Course ID 12/16/2022 C1_Chest   Final   Course Intent 12/16/2022 Curative   Final   Course Start Date 12/16/2022 12/09/2022  3:12 PM   Final   Session Number 12/16/2022 4   Final   Course First Treatment Date 12/16/2022 12/11/2022 12:04 PM   Final   Course Last Treatment Date 12/16/2022 12/16/2022  2:11 PM   Final   Course Elapsed Days 12/16/2022 5   Final   Reference Point ID 12/16/2022 Lung_R DP    Final   Reference Point Dosage Given to Da* 12/16/2022 12  Gy Final   Reference Point Session Dosage Giv* 12/16/2022 3  Gy Final   Plan ID 12/16/2022 Lung_R   Final   Plan Name 12/16/2022 Lung_R   Final   Plan Fractions Treated to Date 12/16/2022 4   Final   Plan Total Fractions Prescribed 12/16/2022 10   Final   Plan Prescribed Dose Per Fraction 12/16/2022 3  Gy Final   Plan Total Prescribed Dose 12/16/2022 30.000000  Gy Final   Plan Primary Reference Point 12/16/2022 Lung_R DP   Final  Orders Only on 12/15/2022  Component Date Value Ref Range Status   Course ID 12/15/2022 C1_Chest   Final   Course Intent 12/15/2022 Curative   Final   Course Start Date 12/15/2022 12/09/2022  3:12 PM   Final   Session Number 12/15/2022 3  Final   Course First Treatment Date 12/15/2022 12/11/2022 12:04 PM   Final   Course Last Treatment Date 12/15/2022 12/15/2022 10:15 AM   Final   Course Elapsed Days 12/15/2022 4   Final   Reference Point ID 12/15/2022 Lung_R DP   Final   Reference Point Dosage Given to Da* 12/15/2022 9  Gy Final   Reference Point Session Dosage Giv* 12/15/2022 3  Gy Final   Plan ID 12/15/2022 Lung_R   Final   Plan Name 12/15/2022 Lung_R   Final   Plan Fractions Treated to Date 12/15/2022 3   Final   Plan Total Fractions Prescribed 12/15/2022 10   Final   Plan Prescribed Dose Per Fraction 12/15/2022 3  Gy Final   Plan Total Prescribed Dose 12/15/2022 30.000000  Gy Final   Plan Primary Reference Point 12/15/2022 Lung_R DP   Final  There may be more visits with results that are not included.    RADIOGRAPHIC STUDIES: I have personally reviewed the radiological images as listed and agree with the findings in the report  DG CHEST PORT 1 VIEW  Result Date: 12/24/2022 CLINICAL DATA:  Dyspnea. EXAM: PORTABLE CHEST 1 VIEW COMPARISON:  12/22/2022 FINDINGS: Again noted is a large opacity in the right upper lung compatible with patient's known lung mass. Increased lucency at the right lung  apex compared to the recent comparison examination. Right jugular port is stable with the tip in the SVC region. Right lung base has improved aeration from the recent comparison examination. No significant airspace disease or consolidation in left lung. Heart mediastinum are stable. Atherosclerotic calcifications at the aortic arch. IMPRESSION: 1. Persistent opacity in the right upper lung compatible with the patient's known mass. 2. Improved aeration at the right lung apex and right lung base compared to the recent comparison examination. Findings could represent decreased pleural fluid. 3. Stable position of Port-A-Cath. Electronically Signed   By: Richarda Overlie M.D.   On: 12/24/2022 08:32    ASSESSMENT/PLAN  82 y.o. female with medical history notable for arthritis, history of melanoma, colon polyps, COPD, coronary artery disease, GERD, osteoporosis, hypertension, CVA, acute kidney injury.  Patient seen initially for evaluation and management of anemia who was subsequently diagnosed with squamous cell carcinoma of the lung   Squamous cell carcinoma of right lung, Stage IV (T4 N2 M1)             October 23 2022- CT PA showed large RUL mass encompassing several ribs.  Spiculated lesions in LUL             November 05 2022- MRI brain negative for metastasis November 18 2022- PET/CT  Necrotic RUL mass 10.7 x 7.9 cm (SUV 29.4) with invasion into right fourth and fifth rib interspace. Tumor extends into right hilum with encasement of RUL and RML bronchus. Tracer avid nodules in left lung  and right paratracheal lymph node. Increased radiotracer uptake localized to distal right clavicle (SUV 3.18) with sclerotic changes on CT.  November 21 2022- Guardant 360 no standard actionable mutations   Therapeutics:  November 21 2022- Patient has advanced lung cancer which can not be cured.  Goal of therapy is palliation.  Her respiratory status is at risk owing to her underlying lung disease and the geography/large volume of her  tumor.  Recommend radiation oncology consult for consideration of therapy for local control.  Following this would consider palliative systemic therapy.   Dec 02 2022- Radiation Oncology Consult Dec 03 2022- Discussed weekly carboplatin  AUC 2 with XRT for radiosensitization.   May 14 Radiation Simulation planned Dec 05 2022 through Dec 24 2022: Admission to [facility] for management of COPD, CHF, cancer related pain. Received a short course of palliative chest radiation during hospitalization.  Discharged to SNF.   Made DNR during the hospitalization    Hospice:  Patient meets criteria for hospice care by virtue of the following factors.  She experienced a catastrophic hospitalization with little prospect for recovery.  Performance status has declined such that they can no longer complete ADL's.  They have several diseases which can not be cured and are worsening.  Their life expectancy of 6 months or less and can not be improved with interventions such as chemotherapy.   In fact risk of harm from those benefits outweighs the potential benefits.  Attempts at further treatment will also decrease QOL.   At today's visit I discussed with patient and family the philosophy of hospice, how it will be administered.   We will make a referral to hospice so that we can begin the process of transitioning care     Cancer Staging  Squamous cell lung cancer [facility]) Staging form: Lung, AJCC 8th Edition - Clinical stage from 11/21/2022: Stage IVA (cT4, cN2, cM1a) - Signed by Courtney Muse, MD on 12/03/2022 Histopathologic type: Squamous cell carcinoma, NOS Stage prefix: Initial diagnosis Histologic grade (G): G3 Histologic grading system: 4 grade system    No problem-specific Assessment & Plan notes found for this encounter.   No orders of the defined types were placed in this encounter.  40  minutes was spent in patient care.  This included time spent preparing to see the patient (e.g., review of tests),  obtaining and/or reviewing separately obtained history, counseling and educating the patient/family/caregiver, ordering medications, tests, or procedures; documenting clinical information in the electronic or other health record, independently interpreting results and communicating results to the patient/family/caregiver as well as coordination of care.      All questions were answered. The patient knows to call the [facility] with any problems, questions or concerns.  This note was electronically signed.    Courtney Muse, MD  12/26/2022 3:25 PM

## 2022-12-26 NOTE — Patient Instructions (Signed)

## 2022-12-29 ENCOUNTER — Ambulatory Visit: Payer: 59

## 2022-12-29 ENCOUNTER — Inpatient Hospital Stay: Payer: 59

## 2022-12-29 ENCOUNTER — Inpatient Hospital Stay: Payer: 59 | Attending: Oncology

## 2022-12-29 NOTE — Progress Notes (Signed)
This NN received a phone call from pts dtr, Pamelia Hoit, relaying her concerns about [facility] rehab center. She doesn't seem to get the amount of physical therapy she needs to get her strength back. Per a conversation with this NN between the pt and her dtr, pt does not want to be put on hospice and will be continuing rehab at this time. This NN verbalized understanding.  Pamelia Hoit would like to move the patient to [facility] or [facility] rehab, however there is currently no availability at those facilities. Pamelia Hoit tells NN that she has put the on wait lists for both facilities.

## 2022-12-29 NOTE — Progress Notes (Deleted)
Palliative Medicine [facility]  [phone] [phone]   Name: Halley Olivas Date: 12/29/2022 MRN: [id number]  DOB: [birth year]  Patient Care Team: [name], MD as PCP - General (Internal Medicine) Runell Gess, MD as Consulting Physician (Cardiology) Loni Muse, MD as Attending Physician (Hematology) Colletta Maryland, RN as [facility] Care Management    REASON FOR CONSULTATION: Courtney Brown is a 82 y.o. 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  Palliative ask to see for symptom management and goals of care.    SOCIAL HISTORY:     reports that she quit smoking about 17 years ago. Her smoking use included cigarettes. She has a 66.00 pack-year smoking history. She has never used smokeless tobacco. She reports that she does not currently use alcohol after a past usage of about 2.0 standard drinks of alcohol per week. She reports that she does not use drugs.  ADVANCE DIRECTIVES:  None on file  CODE STATUS: DNR  PAST MEDICAL HISTORY: Past Medical History:  Diagnosis Date   Allergy    Anemia    Anxiety    Arthritis    Cancer (HCC)    hx of melanoma    Colon polyps    COPD (chronic obstructive pulmonary disease) (HCC)    Coronary artery disease    Dyspnea    with exertion    Edema    in lower extremties    Emphysema of lung (HCC)    GERD (gastroesophageal reflux disease)    H/O hiatal hernia    Hyperlipidemia    Hypertension    Inverted nipple    bilateral, pt says that's normal   Lung cancer (HCC) 10/23/2022   Malignant melanoma (HCC) 1983   again in 1995]   Myocardial infarct, old 2008   no stent placed   Osteoporosis    Pneumonia    hx of    Sepsis (HCC)    2015 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 then had 6 dialysis treatments due to  kidney failure    Stroke [facility])    TIA    PAST SURGICAL HISTORY:  Past Surgical History:  Procedure Laterality Date   ABDOMINAL HYSTERECTOMY  1998   BRONCHIAL BIOPSY  10/27/2022   Procedure: BRONCHIAL BIOPSIES;  Surgeon: Leslye Peer, MD;  Location: [facility] ENDOSCOPY;  Service: Cardiopulmonary;;   BRONCHIAL BRUSHINGS  10/27/2022   Procedure: BRONCHIAL BRUSHINGS;  Surgeon: Leslye Peer, MD;  Location: [facility] ENDOSCOPY;  Service: Cardiopulmonary;;   CHOLECYSTECTOMY  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   HEMOSTASIS CONTROL  10/27/2022   Procedure: HEMOSTASIS CONTROL;  Surgeon: Leslye Peer, MD;  Location: [facility] ENDOSCOPY;  Service: Cardiopulmonary;;   HERNIA REPAIR     umbilical, ventral   INCISIONAL HERNIA REPAIR N/A 10/26/2020   Procedure: DIAGNOSTIC LAPAROSCOPY MESH EXPLANTATION; SMALL BOWEL RESECTION; PRIMARY REPAIR OF INCISIONAL HERNIA;  Surgeon: Sheliah Hatch De Blanch, MD;  Location: [facility] ORS;  Service: General;  Laterality: N/A;   INSERTION OF ILIAC STENT  2008   PORTACATH PLACEMENT N/A 12/08/2022   Procedure: PORT-A-CATH PLACEMENT WITH ULTRASOUND GUIDANCE;  Surgeon: Rodman Pickle, MD;  Location: [facility] ORS;  Service: General;  Laterality: N/A;   VIDEO BRONCHOSCOPY Right 10/27/2022   Procedure: VIDEO BRONCHOSCOPY WITHOUT FLUORO;  Surgeon: Leslye Peer, MD;  Location: [facility] ENDOSCOPY;  Service: Cardiopulmonary;  Laterality: Right;  HEMATOLOGY/ONCOLOGY HISTORY:  Oncology History  Squamous cell lung cancer (HCC)  11/04/2022 Initial Diagnosis   Squamous cell lung cancer (HCC)   11/21/2022 Cancer Staging   Staging form: Lung, AJCC 8th Edition - Clinical stage from 11/21/2022: Stage IVA (cT4, cN2, cM1a) - Signed by Loni Muse, MD on 12/03/2022 Histopathologic type: Squamous cell carcinoma, NOS Stage prefix: Initial diagnosis Histologic grade (G): G3 Histologic grading system: 4 grade system   Malignant neoplasm of upper lobe of right lung (HCC)  12/06/2022 Initial Diagnosis   Malignant  neoplasm of upper lobe of right lung (HCC)   12/26/2022 -  Chemotherapy   Patient is on Treatment Plan : HEAD/NECK Carboplatin + Paclitaxel + XRT q7d       ALLERGIES:  is allergic to prednisone and statins.  MEDICATIONS:  Current Outpatient Medications  Medication Sig Dispense Refill   acetaminophen (TYLENOL) 500 MG tablet Take 2 tablets (1,000 mg total) by mouth every 12 (twelve) hours.     albuterol (VENTOLIN HFA) 108 (90 Base) MCG/ACT inhaler Inhale 2 puffs into the lungs every 4 (four) hours as needed for wheezing or shortness of breath. INHALE 1-2 PUFFS BY MOUTH EVERY 6 HOURS AS NEEDED FOR WHEEZE OR SHORTNESS OF BREATH     ALPRAZolam (XANAX) 0.25 MG tablet Take 1 tablet (0.25 mg total) by mouth at bedtime. 5 tablet 0   budesonide-formoterol (SYMBICORT) 160-4.5 MCG/ACT inhaler TAKE 2 PUFFS BY MOUTH TWICE A DAY 30.6 each 1   Calcium Citrate-Vitamin D (CITRACAL + D PO) Take 1 tablet by mouth in the morning. 600 mg calcium     colesevelam (WELCHOL) 625 MG tablet TAKE 1 TABLET (625 MG TOTAL) BY MOUTH 2 (TWO) TIMES DAILY WITH A MEAL. 180 tablet 1   dexamethasone (DECADRON) 4 MG tablet Take 2 tablets daily for 2 days, start the day after chemotherapy. Take with food. 30 tablet 1   fenofibrate (TRICOR) 145 MG tablet TAKE 1 TABLET BY MOUTH EVERY DAY (Patient taking differently: Take 145 mg by mouth daily.) 90 tablet 1   Ferric Maltol (ACCRUFER) 30 MG CAPS Take 1 capsule by mouth in the morning and at bedtime. 180 capsule 0   folic acid (FOLVITE) 1 MG tablet TAKE 1 TABLET BY MOUTH EVERY DAY 90 tablet 1   furosemide (LASIX) 20 MG tablet Take 2 tablets (40 mg total) by mouth daily.     Garlic Oil 1000 MG CAPS Take 1,000 mg by mouth daily.     Lactobacillus (PROBIOTIC ACIDOPHILUS) CAPS Take 1 tablet by mouth daily.     lidocaine (LIDODERM) 5 % Place 1 patch onto the skin daily. Remove & Discard patch within 12 hours or as directed by MD 30 patch 0   loperamide (IMODIUM) 2 MG capsule Take 1 capsule  (2 mg total) by mouth every 8 (eight) hours as needed for diarrhea or loose stools. 30 capsule 0   loratadine (CLARITIN) 10 MG tablet TAKE 1 TABLET BY MOUTH EVERY DAY 90 tablet 1   magnesium oxide (MAG-OX) 400 (240 Mg) MG tablet TAKE 1 TABLET BY MOUTH TWICE A DAY (Patient taking differently: Take 400 mg by mouth 2 (two) times daily.) 60 tablet 2   metoprolol succinate (TOPROL-XL) 50 MG 24 hr tablet Take 1 tablet (50 mg total) by mouth daily. 90 tablet 0   mometasone (NASONEX) 50 MCG/ACT nasal spray PLACE 2 SPRAYS INTO THE NOSE DAILY. 51 each 0   morphine (MS CONTIN) 15 MG 12 hr tablet Take 1 tablet (15 mg  total) by mouth every 12 (twelve) hours. 10 tablet 0   omeprazole (PRILOSEC) 20 MG capsule TAKE 1 CAPSULE BY MOUTH EVERY DAY (Patient taking differently: Take 20 mg by mouth daily.) 90 capsule 1   ondansetron (ZOFRAN) 8 MG tablet Take 1 tablet (8 mg total) by mouth every 8 (eight) hours as needed for nausea or vomiting. Start on the third day after chemotherapy. 30 tablet 1   oxyCODONE (OXY IR/ROXICODONE) 5 MG immediate release tablet Take 1 tablet (5 mg total) by mouth every 6 (six) hours as needed for moderate pain or severe pain. 10 tablet 0   potassium chloride SA (KLOR-CON M) 20 MEQ tablet Take 1 tablet (20 mEq total) by mouth daily. 30 tablet 0   primidone (MYSOLINE) 50 MG tablet Take 1 tablet (50 mg total) by mouth at bedtime. 90 tablet 3   prochlorperazine (COMPAZINE) 10 MG tablet Take 1 tablet (10 mg total) by mouth every 6 (six) hours as needed for nausea or vomiting. 30 tablet 1   raloxifene (EVISTA) 60 MG tablet TAKE 1 TABLET BY MOUTH EVERY DAY 90 tablet 1   triamcinolone cream (KENALOG) 0.1 % Apply 1 Application topically daily as needed (yeast infection under breast).     Zinc 50 MG TABS TAKE 1 TABLET BY MOUTH EVERY DAY 90 tablet 1   No current facility-administered medications for this visit.    VITAL SIGNS: There were no vitals taken for this visit. There were no vitals filed  for this visit.  Estimated body mass index is 26.97 kg/m as calculated from the following:   Height as of 12/26/22: 5' 1.52" (1.563 m).   Weight as of 12/26/22: 145 lb 3.2 oz (65.9 kg).  LABS: CBC:    Component Value Date/Time   WBC 21.4 (H) 12/26/2022 1449   WBC 19.7 (H) 12/24/2022 0516   HGB 9.2 (L) 12/26/2022 1449   HCT 28.6 (L) 12/26/2022 1449   PLT 625 (H) 12/26/2022 1449   MCV 82.9 12/26/2022 1449   NEUTROABS 18.7 (H) 12/26/2022 1449   NEUTROABS 9 12/01/2016 0000   LYMPHSABS 0.5 (L) 12/26/2022 1449   MONOABS 1.6 (H) 12/26/2022 1449   EOSABS 0.3 12/26/2022 1449   BASOSABS 0.1 12/26/2022 1449   Comprehensive Metabolic Panel:    Component Value Date/Time   NA 139 12/26/2022 1449   NA 144 12/01/2016 0000   K 3.7 12/26/2022 1449   CL 101 12/26/2022 1449   CO2 25 12/26/2022 1449   BUN 9 12/26/2022 1449   BUN 20 12/01/2016 0000   CREATININE 0.48 12/26/2022 1449   GLUCOSE 132 (H) 12/26/2022 1449   CALCIUM 8.1 (L) 12/26/2022 1449   AST 11 (L) 12/26/2022 1449   ALT 9 12/26/2022 1449   ALKPHOS 143 (H) 12/26/2022 1449   BILITOT 0.3 12/26/2022 1449   PROT 5.3 (L) 12/26/2022 1449   ALBUMIN 2.0 (L) 12/26/2022 1449    RADIOGRAPHIC STUDIES:  CT ABDOMEN PELVIS W CONTRAST  Result Date: 12/08/2022 CLINICAL DATA:  Severe acute epigastric abdominal pain. History of lung carcinoma. EXAM: CT ABDOMEN AND PELVIS WITH CONTRAST TECHNIQUE: Multidetector CT imaging of the abdomen and pelvis was performed using the standard protocol following bolus administration of intravenous contrast. RADIATION DOSE REDUCTION: This exam was performed according to the departmental dose-optimization program which includes automated exposure control, adjustment of the mA and/or kV according to patient size and/or use of iterative reconstruction technique. CONTRAST:  OMNIPAQUE IOHEXOL 300 MG/ML  SOLN COMPARISON:  CT of the chest, abdomen  and pelvis on 12/05/2022 FINDINGS: Lower chest: Stable small right  pleural effusion and trace left pleural fluid. Hepatobiliary: No focal liver abnormality is seen. Status post cholecystectomy. No biliary dilatation. Pancreas: New acute pancreatitis since the prior scan with peripancreatic inflammation and edema surrounding the entire pancreas. No evidence of pancreatic necrosis, pseudocyst or pancreatic ductal dilatation. No associated pancreatic masses. Spleen: Normal in size without focal abnormality. Adrenals/Urinary Tract: Adrenal glands are unremarkable. Stable appearance of kidneys including bilateral cysts and a probable hemorrhagic cyst of the left upper pole. Bladder is unremarkable. Stomach/Bowel: No bowel obstruction or free intraperitoneal air. Potential mild ileus involving the ascending and transverse colon. Vascular/Lymphatic: Stable atherosclerosis of the abdominal aorta and iliac arteries without aneurysm. Reproductive: Status post hysterectomy. No adnexal masses. Other: New free fluid in the pelvis.  No discrete abscess. Musculoskeletal: No acute or significant osseous findings. IMPRESSION: 1. New acute pancreatitis with peripancreatic inflammation and edema surrounding the entire pancreas. No evidence of pancreatic necrosis, pseudocyst or pancreatic ductal dilatation. 2. New free fluid in the pelvis. 3. Potential mild ileus involving the ascending and transverse colon. 4. Stable small right pleural effusion and trace left pleural fluid. 5. Stable atherosclerosis of the abdominal aorta and iliac arteries. Aortic Atherosclerosis (ICD10-I70.0). Electronically Signed   By: Irish Lack M.D.   On: 12/08/2022 18:01   CT CHEST ABDOMEN PELVIS W CONTRAST  Result Date: 12/05/2022 CLINICAL DATA:  Sepsis, history of pulmonary malignancy. Shortness of breath, nausea and vomiting. EXAM: CT CHEST, ABDOMEN, AND PELVIS WITH CONTRAST TECHNIQUE: Multidetector CT imaging of the chest, abdomen and pelvis was performed following the standard protocol during bolus  administration of intravenous contrast. RADIATION DOSE REDUCTION: This exam was performed according to the departmental dose-optimization program which includes automated exposure control, adjustment of the mA and/or kV according to patient size and/or use of iterative reconstruction technique. CONTRAST:  OMNIPAQUE IOHEXOL 300 MG/ML  SOLN COMPARISON:  02/13/2021, 11/18/2022, 10/23/2022. FINDINGS: CT CHEST FINDINGS Cardiovascular: Heart is enlarged and there is a trace pericardial effusion. Few scattered coronary artery calcifications are noted. There is atherosclerotic calcification of the aorta without evidence of aneurysm. The pulmonary trunk is normal in caliber. Mediastinum/Nodes: Enlarged lymph nodes are present in the mediastinum measuring up to 1 cm in the right paratracheal space there suspected enlarged lymph nodes at the right hilum measuring up to 1.5 cm. No left hilar or axillary lymphadenopathy. The thyroid gland, trachea, and esophagus are within normal limits. Lungs/Pleura: Paraseptal and centrilobular emphysematous changes are present in the lungs. There is a small loculated pleural effusion on the right. There is redemonstration of a right upper lobe mass with central necrosis and extension of tumor into the chest wall musculature and soft tissues on the right. Atelectasis is present at the right lung base. Spiculated opacities are noted in the left upper lobe with a few satellite nodules and not significantly changed from the prior exam. Few pulmonary nodules are noted in the left lower lobe, the largest measuring 5 mm, axial image 93, new from the previous exam. Musculoskeletal: There are erosions and bony destruction involving the T3 and T4 ribs on the right. No acute fracture is seen. CT ABDOMEN PELVIS FINDINGS Hepatobiliary: No focal liver abnormality is seen. Status post cholecystectomy. No biliary dilatation. Pancreas: Unremarkable. No pancreatic ductal dilatation or surrounding  inflammatory changes. Spleen: Normal in size without focal abnormality. 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 The left adrenal gland is within normal limits.  The kidneys enhance symmetrically with multifocal renal cortical thinning or scarring. Stable cysts and hypodensities are present within the kidneys bilaterally. No renal calculus or hydronephrosis. The bladder is unremarkable. Stomach/Bowel: Stomach is within normal limits. Appendix appears normal. No evidence of bowel wall thickening, distention, or inflammatory changes. No free air or pneumatosis. A few scattered diverticula are present along the colon without evidence of diverticulitis. Vascular/Lymphatic: Aortic atherosclerosis. No enlarged abdominal or pelvic lymph nodes. Reproductive: Status post hysterectomy. No adnexal masses. Other: A trace amount of free fluid is noted in the pelvis. 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 Musculoskeletal: No acute or suspicious osseous abnormality. IMPRESSION: 1. Necrotic right upper lobe mass, increased in size from the prior exam with extension into the chest wall musculature on the right with erosions and destructive changes of the T3 and T4 ribs on the right. 2. Small right pleural effusion with atelectasis. 3. Stable spiculated opacities in the left upper lobe with a few surrounding satellite nodules and new pulmonary nodules in the left lower lobe, suspicious for metastasis. 4. No acute process in the abdomen and pelvis. 5. Coronary artery calcifications and aortic atherosclerosis. Electronically Signed   By: Thornell Sartorius M.D.   On: 12/05/2022 20:44   DG Chest 2 View  Result Date: 12/05/2022 CLINICAL DATA:  Lung cancer.  Shortness of breath EXAM: CHEST - 2 VIEW COMPARISON:  X-ray 10/15/2022.  PET-CT scan 11/18/2022. FINDINGS:  Large right upper lobe mass is increasing in size compared to the prior x-ray. Please correlate with more recent PET-CT. There is a small right effusion which is increased as well. Left lung is grossly clear without pneumothorax or effusion. No left-sided consolidation. Stable cardiopericardial silhouette without edema. Overlapping cardiac leads. Film is rotated to the right. IMPRESSION: Increasing right-sided lung mass and right-sided pleural effusion. Please correlate with recent PET-CT Electronically Signed   By: Karen Kays M.D.   On: 12/05/2022 15:18    PERFORMANCE STATUS (ECOG) : {CHL ONC ECOG ZO:[id number]}  Review of Systems Unless otherwise noted, a complete review of systems is negative.  Physical Exam General: NAD Cardiovascular: regular rate and rhythm Pulmonary: clear ant fields Abdomen: soft, nontender, + bowel sounds Extremities: no edema, no joint deformities Skin: no rashes Neurological:  IMPRESSION: *** I introduced myself, Dayona Shaheen RN, and Palliative's role in collaboration with the oncology team. Concept of Palliative Care was introduced as specialized medical care for people and their families living with serious illness.  It focuses on providing relief from the symptoms and stress of a serious illness.  The goal is to improve quality of life for both the patient and the family. Values and goals of care important to patient and family were attempted to be elicited.    We discussed *** current illness and what it means in the larger context of *** on-going co-morbidities. Natural disease trajectory and expectations were discussed.  I discussed the importance of continued conversation with family and their medical providers regarding overall plan of care and treatment options, ensuring decisions are within the context of the patients values and GOCs.  PLAN: Established therapeutic relationship. Education provided on palliative's role in collaboration with their  Oncology/Radiation team. I will plan to see patient back in 2-4 weeks in collaboration to other oncology appointments.    Patient expressed understanding and was in agreement with this plan. She also understands that She can  call the clinic at any time with any questions, concerns, or complaints.   Thank you for your referral and allowing Palliative to assist in Mrs. Quetzaly Barbuto's care.   Number and complexity of problems addressed: ***HIGH - 1 or more chronic illnesses with SEVERE exacerbation, progression, or side effects of treatment - advanced cancer, pain. Any controlled substances utilized were prescribed in the context of palliative care.   Visit consisted of counseling and education dealing with the complex and emotionally intense issues of symptom management and palliative care in the setting of serious and potentially life-threatening illness.Greater than 50%  of this time was spent counseling and coordinating care related to the above assessment and plan.  Signed by: Willette Alma, AGPCNP-BC Palliative Medicine Team/[facility]   *Please note that this is a verbal dictation therefore any spelling or grammatical errors are due to the "[employer]agon [employer] One" system interpretation.

## 2022-12-30 ENCOUNTER — Ambulatory Visit: Payer: 59

## 2022-12-30 ENCOUNTER — Telehealth: Payer: Self-pay | Admitting: Nurse Practitioner

## 2022-12-30 DIAGNOSIS — D72829 Elevated white blood cell count, unspecified: Secondary | ICD-10-CM | POA: Diagnosis not present

## 2022-12-30 NOTE — Radiation Completion Notes (Signed)
Patient Name: Courtney Brown, Courtney Brown MRN: [id number] Date of Birth: 12-Mar-1941 Referring Physician: Leatha Gilding, M.D. Date of Service: 2022-12-30 Radiation Oncologist: Arnette Schaumann, M.D. [facility] [place]                             RADIATION ONCOLOGY END OF TREATMENT NOTE     Diagnosis: C34.11 Malignant neoplasm of upper lobe, right bronchus or lung Staging on 2022-11-21: Squamous cell lung cancer (HCC) T=cT4, N=cN2, M=cM1a Intent: Palliative     ==========DELIVERED PLANS==========  First Treatment Date: 2022-12-11 - Last Treatment Date: 2022-12-25   Plan Name: Lung_R Site: Lung, Right Technique: 3D Mode: Photon Dose Per Fraction: 3 Gy Prescribed Dose (Delivered / Prescribed): 30 Gy / 30 Gy Prescribed Fxs (Delivered / Prescribed): 10 / 10     ==========ON TREATMENT VISIT DATES========== 2022-12-16, 2022-12-23     ==========UPCOMING VISITS==========       ==========APPENDIX - ON TREATMENT VISIT NOTES==========   See weekly On Treatment Notes in Epic for details.

## 2022-12-30 NOTE — Telephone Encounter (Signed)
Scheduled appointment per scheduling message. Left voicemail.  

## 2022-12-31 ENCOUNTER — Ambulatory Visit: Payer: 59

## 2022-12-31 DIAGNOSIS — C3411 Malignant neoplasm of upper lobe, right bronchus or lung: Secondary | ICD-10-CM | POA: Diagnosis not present

## 2022-12-31 NOTE — Progress Notes (Signed)
This NN left VM on pts mobile phone requesting a return phone call upon receiving the message.

## 2023-01-01 ENCOUNTER — Non-Acute Institutional Stay: Payer: 59 | Admitting: Hospice

## 2023-01-01 ENCOUNTER — Ambulatory Visit: Payer: 59

## 2023-01-01 ENCOUNTER — Encounter: Payer: Self-pay | Admitting: Oncology

## 2023-01-01 ENCOUNTER — Other Ambulatory Visit: Payer: Self-pay | Admitting: Internal Medicine

## 2023-01-01 DIAGNOSIS — J449 Chronic obstructive pulmonary disease, unspecified: Secondary | ICD-10-CM

## 2023-01-01 DIAGNOSIS — G893 Neoplasm related pain (acute) (chronic): Secondary | ICD-10-CM | POA: Diagnosis not present

## 2023-01-01 DIAGNOSIS — I5032 Chronic diastolic (congestive) heart failure: Secondary | ICD-10-CM

## 2023-01-01 DIAGNOSIS — J9601 Acute respiratory failure with hypoxia: Secondary | ICD-10-CM

## 2023-01-01 DIAGNOSIS — C3491 Malignant neoplasm of unspecified part of right bronchus or lung: Secondary | ICD-10-CM

## 2023-01-01 DIAGNOSIS — K219 Gastro-esophageal reflux disease without esophagitis: Secondary | ICD-10-CM

## 2023-01-01 DIAGNOSIS — Z515 Encounter for palliative care: Secondary | ICD-10-CM

## 2023-01-01 LAB — GUARDANT 360

## 2023-01-01 NOTE — Progress Notes (Signed)
Therapist, nutritional Palliative Care Consult Note Telephone: [phone]  Fax: [phone]  PATIENT NAME: Courtney Brown [address] [place] [address] [phone] (home)  DOB: March 12, 1941 MRN: [id number]  PRIMARY CARE PROVIDER:    [name], MD,  [address] [place] [address] [phone]  REFERRING PROVIDER:     RESPONSIBLE PARTY:  Self/[name] Contact Information     Name Relation Home Work [place] Daughter [phone]  [phone]   [name],[name] Daughter [phone]  [phone]        I met face to face with patient in the facility. Visit to build trust and highlight Palliative Medicine as specialized medical care for people living with serious illness, aimed at facilitating better quality of life through symptoms relief, assisting with advance care planning and complex medical decision making.  NP called Pamelia Hoit and left her a voicemail with call back number.   Palliative care team will continue to support patient, patient's family, and medical team.  ASSESSMENT AND / RECOMMENDATIONS:  ------------------------------------------------------------------------------------------------------------------------------------------------- Advance Care Planning: Our advance care planning conversation included a discussion about:    The value and importance of advance care planning  Difference between Hospice and Palliative care Exploration of goals of care in the event of a sudden injury or illness  Identification and preparation of a healthcare agent  Review and updating or creation of an  advance directive document . Decision not to resuscitate or to de-escalate disease focused treatments due to poor prognosis.  CODE STATUS: Patient reverted to Full code at the facility  Goals of Care: Goals include to maximize quality of life and symptom management. Patient wishes to regain her strength and  function - walk, shop, be independent, as she used to before her health decline related to stage 4 lung CA.   I spent 16 minutes providing this initial consultation. More than 50% of the time in this consultation was spent on counseling patient and coordinating communication. --------------------------------------------------------------------------------------------------------------------------------------  Symptom Management/Plan: Acute respiratory failure with hypoxia: worsened with pneumonia for which she was  hospitalized  12/05/2022 Discharge date: 12/24/2022. Completed anitbiotics.  Use oxygen supplementation 2 L/Min as needed for shortness of breath.   Lung CA: stage 4. Completed radiation, no chemo - patient said not to be a candidate for chemo; performance status is very poor. .  Outpatient follow-up with radiation oncology.   CHF: Adhere to salt and fluid limits.Take Lasix 40 mg daily. Follow up with Cardiologist as ordered.  Elevate BLE to help promote circulation. Monitor for shortness of breath, edema, chest pain Monitor weight closely 12/31/2022   140.6 Ibs  12/24/2022 145.6 Lbs  COPD: recent exacerbation with pneumonia and newly diagnosed Lung cancer stage 4. Continue Symbicort, Albuterol. Routine CBC CMP.  Pain: Cancer related pain. Managed with scheduled MS Contin and prn Oxycodone as ordered.   Follow up: Palliative care will continue to follow for complex medical decision making, advance care planning, and clarification of goals. Return 6 weeks or prn. Encouraged to call provider sooner with any concerns.   Family /Caregiver/Community Supports: Patient in SNF for acute rehab  HOSPICE ELIGIBILITY/DIAGNOSIS: TBD  Chief Complaint: Initial Palliative care visit  HISTORY OF PRESENT ILLNESS:  Courtney Brown is a 82 y.o. 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  Collaborative discussion with PCP for Lasix for edema.  Patient reports pain is well-managed with current pain regiment, endorsed weakness. History obtained from review of EMR, discussion with primary team, caregiver, family and/or Courtney Brown.  Review and summarization of Epic records shows history from other than patient. Rest of 10 point ROS asked and negative. Independent interpretation of tests and reviewed as needed, available labs, patient records, imaging, studies and related documents from the EMR.  PHYSICAL EXAM: GEN: Frail, cooperative Cardiac: S1 S2, 2+ pitting edema to BLE, poth a cath to left upper chest Respiratory:  diminished in the bases, slight crackles in bil lower lungs GI: soft, nontender,  + BS   MS: Moving all extremities,  Skin: warm and dry, no rash to visible skin Neuro: Generalized weakness, nonfocal, a & o x 3 Psych: non-anxious affect  Recent Labs  Lab 12/26/22 1449  WBC 21.4*  HGB 9.2*  HCT 28.6*  PLT 625*  MCV 82.9   Recent Labs  Lab 12/26/22 1449  NA 139  K 3.7  CL 101  CO2 25  BUN 9  CREATININE 0.48  GLUCOSE 132*   Latest GFR by Cockcroft Gault (not valid in AKI or ESRD) Estimated Creatinine Clearance: 48.6 mL/min (by C-G formula based on SCr of 0.48 mg/dL). Recent Labs  Lab 12/26/22 1449  AST 11*  ALT 9  ALKPHOS 143*  x   PAST MEDICAL HISTORY:  Active Ambulatory Problems    Diagnosis Date Noted   COPD GOLD II 07/29/2016   HTN (hypertension), benign 07/29/2016   CAD (coronary artery disease) 07/29/2016   PHN (postherpetic neuralgia) 07/29/2016   Adjustment disorder with mixed anxiety and depressed mood 07/29/2016   Osteoporosis 07/29/2016   Gastroesophageal reflux disease without esophagitis 07/29/2016   Hearing loss 07/29/2016   Colon polyps 08/08/2016   PVD (peripheral vascular disease) (HCC) 08/08/2016    Acute respiratory failure with hypoxia (HCC)    Chronic bilateral low back pain with right-sided sciatica 09/01/2017   Chronic respiratory failure with hypercapnia (HCC) 01/19/2018   Goals of care, counseling/discussion 06/03/2018   Tobacco abuse 08/19/2019   Incisional hernia 10/26/2020   Prediabetes 01/30/2021   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/2021   Hyperlipidemia LDL goal <70 08/21/2021   Coarse tremors 02/18/2022   Deficiency anemia 02/26/2022   Iron deficiency anemia secondary to inadequate dietary iron intake 02/26/2022   Thrombocytosis 02/26/2022   Vitamin B12 deficiency anemia due to intrinsic factor deficiency 02/26/2022   Anemia due to zinc deficiency 03/01/2022   LRTI (lower respiratory tract infection) 06/04/2022   Degenerative arthritis of right knee 07/09/2022   Spinal stenosis of lumbar region 07/09/2022   Hypercalcemia 08/26/2022   Squamous cell lung cancer (HCC) 11/04/2022   Cancer related pain 11/04/2022   Stage IV squamous cell carcinoma of right lung (HCC) 12/05/2022   DNR (do not resuscitate)/DNI(Do Not Intubate) 12/05/2022   Asymptomatic bacteriuria 12/05/2022   Cancer associated pain 12/06/2022   Malignant neoplasm of upper lobe of right lung (HCC) 12/06/2022   High risk medication use 12/06/2022   Palliative care encounter 12/06/2022   Counseling and coordination of care 12/07/2022   Medication management 12/07/2022   Malnutrition of moderate degree 12/16/2022   Resolved Ambulatory Problems    Diagnosis Date Noted   Hyperlipidemia 07/29/2016   Chronic diarrhea 07/29/2016   Post-menopausal 07/29/2016   Hypomagnesemia 07/29/2016   Hypokalemia 07/29/2016   Community acquired pneumonia 11/25/2016   Influenza B    Coagulase negative Staphylococcus bacteremia  Sepsis due to Streptococcus pneumoniae (HCC)    Preoperative clearance 08/19/2019   Hemarthrosis of right knee 09/12/2019   Left knee pain 09/12/2019   Facial abrasion, initial  encounter 09/12/2019   Post herpetic neuralgia 01/31/2021   Dietary folate deficiency anemia 02/26/2022   Mass of upper lobe of right lung 10/23/2022   Past Medical History:  Diagnosis Date   Allergy    Anemia    Anxiety    Arthritis    Cancer (HCC)    COPD (chronic obstructive pulmonary disease) (HCC)    Coronary artery disease    Dyspnea    Edema    Emphysema of lung (HCC)    GERD (gastroesophageal reflux disease)    H/O hiatal hernia    Hypertension    Inverted nipple    Lung cancer (HCC) 10/23/2022   Malignant melanoma (HCC) 1983   Myocardial infarct, old 2008   Pneumonia    Sepsis (HCC)    Stroke (HCC)     SOCIAL HX:  Social History   Tobacco Use   Smoking status: Former    Packs/day: 1.50    Years: 44.00    Additional pack years: 0.00    Total pack years: 66.00    Types: Cigarettes    Quit date: 07/28/2005    Years since quitting: 17.4   Smokeless tobacco: Never  Substance Use Topics   Alcohol use: Not Currently    Alcohol/week: 2.0 standard drinks of alcohol    Types: 2 Shots of liquor per week     FAMILY HX:  Family History  Problem Relation Age of Onset   Arthritis Mother    Hearing loss Father    Colon cancer Brother    Hearing loss Maternal Grandmother    Cancer Maternal Grandmother        colon   Cancer Paternal Grandmother        colon   Hearing loss Maternal Aunt    Breast cancer Neg Hx       ALLERGIES:  Allergies  Allergen Reactions   Prednisone Other (See Comments)    Leg pain,light headed   Statins     Myalgias- per patient, she feels better off the medication      PERTINENT MEDICATIONS:  Outpatient Encounter Medications as of 01/01/2023  Medication Sig   acetaminophen (TYLENOL) 500 MG tablet Take 2 tablets (1,000 mg total) by mouth every 12 (twelve) hours.   albuterol (VENTOLIN HFA) 108 (90 Base) MCG/ACT inhaler Inhale 2 puffs into the lungs every 4 (four) hours as needed for wheezing or shortness of breath. INHALE 1-2 PUFFS BY  MOUTH EVERY 6 HOURS AS NEEDED FOR WHEEZE OR SHORTNESS OF BREATH   ALPRAZolam (XANAX) 0.25 MG tablet Take 1 tablet (0.25 mg total) by mouth at bedtime.   budesonide-formoterol (SYMBICORT) 160-4.5 MCG/ACT inhaler TAKE 2 PUFFS BY MOUTH TWICE A DAY   Calcium Citrate-Vitamin D (CITRACAL + D PO) Take 1 tablet by mouth in the morning. 600 mg calcium   colesevelam (WELCHOL) 625 MG tablet TAKE 1 TABLET (625 MG TOTAL) BY MOUTH 2 (TWO) TIMES DAILY WITH A MEAL.   dexamethasone (DECADRON) 4 MG tablet Take 2 tablets daily for 2 days, start the day after chemotherapy. Take with food.   fenofibrate (TRICOR) 145 MG tablet TAKE 1 TABLET BY MOUTH EVERY DAY (Patient taking differently: Take 145 mg by mouth daily.)   Ferric Maltol (ACCRUFER) 30 MG CAPS Take 1 capsule by mouth in the morning and at bedtime.  folic acid (FOLVITE) 1 MG tablet TAKE 1 TABLET BY MOUTH EVERY DAY   furosemide (LASIX) 20 MG tablet Take 2 tablets (40 mg total) by mouth daily.   Garlic Oil 1000 MG CAPS Take 1,000 mg by mouth daily.   Lactobacillus (PROBIOTIC ACIDOPHILUS) CAPS Take 1 tablet by mouth daily.   lidocaine (LIDODERM) 5 % Place 1 patch onto the skin daily. Remove & Discard patch within 12 hours or as directed by MD   loperamide (IMODIUM) 2 MG capsule Take 1 capsule (2 mg total) by mouth every 8 (eight) hours as needed for diarrhea or loose stools.   loratadine (CLARITIN) 10 MG tablet TAKE 1 TABLET BY MOUTH EVERY DAY   magnesium oxide (MAG-OX) 400 (240 Mg) MG tablet TAKE 1 TABLET BY MOUTH TWICE A DAY (Patient taking differently: Take 400 mg by mouth 2 (two) times daily.)   metoprolol succinate (TOPROL-XL) 50 MG 24 hr tablet Take 1 tablet (50 mg total) by mouth daily.   mometasone (NASONEX) 50 MCG/ACT nasal spray PLACE 2 SPRAYS INTO THE NOSE DAILY.   morphine (MS CONTIN) 15 MG 12 hr tablet Take 1 tablet (15 mg total) by mouth every 12 (twelve) hours.   omeprazole (PRILOSEC) 20 MG capsule TAKE 1 CAPSULE BY MOUTH EVERY DAY   ondansetron  (ZOFRAN) 8 MG tablet Take 1 tablet (8 mg total) by mouth every 8 (eight) hours as needed for nausea or vomiting. Start on the third day after chemotherapy.   oxyCODONE (OXY IR/ROXICODONE) 5 MG immediate release tablet Take 1 tablet (5 mg total) by mouth every 6 (six) hours as needed for moderate pain or severe pain.   potassium chloride SA (KLOR-CON M) 20 MEQ tablet Take 1 tablet (20 mEq total) by mouth daily.   primidone (MYSOLINE) 50 MG tablet Take 1 tablet (50 mg total) by mouth at bedtime.   prochlorperazine (COMPAZINE) 10 MG tablet Take 1 tablet (10 mg total) by mouth every 6 (six) hours as needed for nausea or vomiting.   raloxifene (EVISTA) 60 MG tablet TAKE 1 TABLET BY MOUTH EVERY DAY   triamcinolone cream (KENALOG) 0.1 % Apply 1 Application topically daily as needed (yeast infection under breast).   Zinc 50 MG TABS TAKE 1 TABLET BY MOUTH EVERY DAY   No facility-administered encounter medications on file as of 01/01/2023.     Thank you for the opportunity to participate in the care of Courtney Brown.  The palliative care team will continue to follow. Please call our office at [phone] if we can be of additional assistance.   Note: Portions of this note were generated with Scientist, clinical (histocompatibility and immunogenetics). Dictation errors may occur despite best attempts at proofreading.  Rosaura Carpenter, NP

## 2023-01-02 ENCOUNTER — Ambulatory Visit: Payer: Self-pay

## 2023-01-02 NOTE — Patient Outreach (Signed)
  Care Coordination   Follow Up Visit Note   01/02/2023 Name: Courtney Brown MRN: [id number] DOB: 1941/01/01  Courtney Brown is a 82 y.o. year old female who sees [name], MD for primary care. I spoke with  [name] by phone today.  What matters to the patients health and wellness today?  Readmitted 5/10-5/29 stage IV carcinoma or right lung. Currently at [facility] for rehab. Per review of chart, Palliative care visit on 01/01/23. RNCM will reach out post discharge to home.  Goals Addressed             This Visit's Progress    Care Coordination Activities       Interventions Today    Flowsheet Row Most Recent Value  Chronic Disease   Chronic disease during today's visit Chronic Obstructive Pulmonary Disease (COPD), Other  General Interventions   General Interventions Discussed/Reviewed General Interventions Reviewed  [confirmed patient is at [facility] for rehab]            SDOH assessments and interventions completed:  No  Care Coordination Interventions:  Yes, provided   Follow up plan:  RNCM will continue to follow. Outreach once discharged from SNF rehab.    Encounter Outcome:  Pt. Visit Completed   [name], RN, MSN, BSN, CCM [facility] Care Coordinator [phone]

## 2023-01-04 ENCOUNTER — Encounter: Payer: Self-pay | Admitting: Oncology

## 2023-01-05 ENCOUNTER — Telehealth: Payer: Self-pay | Admitting: Internal Medicine

## 2023-01-05 NOTE — Telephone Encounter (Signed)
Rescheduled appointment per staff message. Talked with the patients daughter Pamelia Hoit and she is aware of the changes made to the patients upcoming appointment.

## 2023-01-06 DIAGNOSIS — T402X5A Adverse effect of other opioids, initial encounter: Secondary | ICD-10-CM | POA: Diagnosis not present

## 2023-01-08 ENCOUNTER — Other Ambulatory Visit: Payer: Self-pay | Admitting: Internal Medicine

## 2023-01-08 DIAGNOSIS — E782 Mixed hyperlipidemia: Secondary | ICD-10-CM

## 2023-01-09 DIAGNOSIS — C3411 Malignant neoplasm of upper lobe, right bronchus or lung: Secondary | ICD-10-CM | POA: Diagnosis not present

## 2023-01-12 ENCOUNTER — Telehealth: Payer: Self-pay | Admitting: Internal Medicine

## 2023-01-12 NOTE — Telephone Encounter (Signed)
[facility] has been informed that PCP will be the attending.

## 2023-01-12 NOTE — Progress Notes (Signed)
NN received VM from pt's daughter, Pamelia Hoit, this morning. Pamelia Hoit stated that the pt has decided to go on hospice care and forego treatment. Pamelia Hoit requested that the appt the pt had with Dr.Mohamed be cancelled.  NN called Pamelia Hoit back and informed her that Dr.Mohamed has been notified of the pts decision. Pamelia Hoit states that the rehab facility that the pt was in, [facility], had already put a consult in for [facility] hospice and doesn't need one from [facility] office. NN verbalized understanding and appreciation for being able to be involved in the pts care. NN let Pamelia Hoit know that she or the pt may continue to reach out if necessary.  NN also reached out to the pt to let her know NN is aware of her decision. LVM wishing her the best and encouraged her to continue to reach out to NN for assistance.

## 2023-01-12 NOTE — Telephone Encounter (Signed)
Sherry from [facility] called and wants to know if Dr. Yetta Barre will be the attending on record and if he feels the patient has 6 months or less left to live.  Please call back at:  [phone]

## 2023-01-13 ENCOUNTER — Inpatient Hospital Stay: Payer: 59 | Admitting: Internal Medicine

## 2023-01-13 ENCOUNTER — Inpatient Hospital Stay: Payer: 59

## 2023-01-19 ENCOUNTER — Ambulatory Visit: Payer: 59 | Admitting: Family Medicine

## 2023-01-23 ENCOUNTER — Encounter: Payer: Self-pay | Admitting: Radiation Oncology

## 2023-01-26 ENCOUNTER — Telehealth: Payer: Self-pay

## 2023-01-26 ENCOUNTER — Ambulatory Visit
Admit: 2023-01-26 | Discharge: 2023-01-26 | Disposition: A | Discharge status: Home / Self Care | Payer: 59 | Attending: Radiation Oncology | Admitting: Radiation Oncology

## 2023-01-26 HISTORY — DX: Personal history of irradiation: Z92.3

## 2023-01-26 NOTE — Telephone Encounter (Signed)
Called daughter to ask if patient was coming in for follow up appointment. Per Daughter [name] patient is now under hospice care. All follow ups to be canceled.

## 2023-02-02 ENCOUNTER — Ambulatory Visit: Payer: 59 | Admitting: Internal Medicine

## 2023-02-25 ENCOUNTER — Ambulatory Visit: Payer: 59 | Admitting: Neurology

## 2023-02-26 DEATH — deceased
